# Patient Record
Sex: Female | Born: 1945 | Race: White | Hispanic: No | Smoking: Current every day smoker
Health system: Southern US, Community
[De-identification: ages and names within clinical notes are randomized; demographics above are authoritative.]

## PROBLEM LIST (undated history)

## (undated) ENCOUNTER — Emergency Department (HOSPITAL_COMMUNITY): Payer: Medicaid Other | Source: Home / Self Care

## (undated) DIAGNOSIS — I252 Old myocardial infarction: Secondary | ICD-10-CM

## (undated) DIAGNOSIS — C3490 Malignant neoplasm of unspecified part of unspecified bronchus or lung: Secondary | ICD-10-CM

## (undated) DIAGNOSIS — M1990 Unspecified osteoarthritis, unspecified site: Secondary | ICD-10-CM

## (undated) DIAGNOSIS — E785 Hyperlipidemia, unspecified: Secondary | ICD-10-CM

## (undated) DIAGNOSIS — I10 Essential (primary) hypertension: Secondary | ICD-10-CM

## (undated) DIAGNOSIS — K219 Gastro-esophageal reflux disease without esophagitis: Secondary | ICD-10-CM

## (undated) DIAGNOSIS — I2510 Atherosclerotic heart disease of native coronary artery without angina pectoris: Secondary | ICD-10-CM

## (undated) DIAGNOSIS — E1121 Type 2 diabetes mellitus with diabetic nephropathy: Secondary | ICD-10-CM

## (undated) DIAGNOSIS — I4819 Other persistent atrial fibrillation: Secondary | ICD-10-CM

## (undated) DIAGNOSIS — I639 Cerebral infarction, unspecified: Secondary | ICD-10-CM

## (undated) DIAGNOSIS — E1140 Type 2 diabetes mellitus with diabetic neuropathy, unspecified: Secondary | ICD-10-CM

## (undated) HISTORY — DX: Type 2 diabetes mellitus with diabetic nephropathy: E11.21

## (undated) HISTORY — DX: Hyperlipidemia, unspecified: E78.5

## (undated) HISTORY — DX: Other persistent atrial fibrillation: I48.19

## (undated) HISTORY — DX: Atherosclerotic heart disease of native coronary artery without angina pectoris: I25.10

## (undated) HISTORY — DX: Type 2 diabetes mellitus with diabetic neuropathy, unspecified: E11.40

## (undated) HISTORY — DX: Unspecified osteoarthritis, unspecified site: M19.90

## (undated) HISTORY — DX: Cerebral infarction, unspecified: I63.9

## (undated) HISTORY — DX: Gastro-esophageal reflux disease without esophagitis: K21.9

## (undated) HISTORY — PX: CHOLECYSTECTOMY: SHX55

## (undated) HISTORY — DX: Old myocardial infarction: I25.2

## (undated) HISTORY — DX: Essential (primary) hypertension: I10

## (undated) HISTORY — DX: Malignant neoplasm of unspecified part of unspecified bronchus or lung: C34.90

## (undated) MED FILL — Ferumoxytol Inj 510 MG/17ML (30 MG/ML) (Elemental Fe): INTRAVENOUS | Qty: 17 | Status: AC

## (undated) MED FILL — Pembrolizumab IV Soln 100 MG/4ML (25 MG/ML): INTRAVENOUS | Qty: 8 | Status: AC

---

## 2002-05-26 DIAGNOSIS — I252 Old myocardial infarction: Secondary | ICD-10-CM

## 2002-05-26 HISTORY — DX: Old myocardial infarction: I25.2

## 2005-04-11 ENCOUNTER — Ambulatory Visit: Payer: Self-pay | Admitting: Cardiology

## 2005-04-11 ENCOUNTER — Inpatient Hospital Stay (HOSPITAL_COMMUNITY): Admission: AD | Admit: 2005-04-11 | Discharge: 2005-04-13 | Payer: Self-pay | Admitting: Cardiology

## 2010-08-25 ENCOUNTER — Inpatient Hospital Stay (HOSPITAL_COMMUNITY)
Admission: AD | Admit: 2010-08-25 | Discharge: 2010-08-27 | DRG: 247 | Disposition: A | Discharge status: Home / Self Care | Payer: PRIVATE HEALTH INSURANCE | Source: Other Acute Inpatient Hospital | Attending: Cardiovascular Disease | Admitting: Cardiovascular Disease

## 2010-08-25 DIAGNOSIS — Z7982 Long term (current) use of aspirin: Secondary | ICD-10-CM

## 2010-08-25 DIAGNOSIS — I200 Unstable angina: Secondary | ICD-10-CM | POA: Diagnosis present

## 2010-08-25 DIAGNOSIS — E119 Type 2 diabetes mellitus without complications: Secondary | ICD-10-CM | POA: Diagnosis present

## 2010-08-25 DIAGNOSIS — Z9861 Coronary angioplasty status: Secondary | ICD-10-CM

## 2010-08-25 DIAGNOSIS — I10 Essential (primary) hypertension: Secondary | ICD-10-CM | POA: Diagnosis present

## 2010-08-25 DIAGNOSIS — Z7902 Long term (current) use of antithrombotics/antiplatelets: Secondary | ICD-10-CM

## 2010-08-25 DIAGNOSIS — E785 Hyperlipidemia, unspecified: Secondary | ICD-10-CM | POA: Diagnosis present

## 2010-08-25 DIAGNOSIS — F17200 Nicotine dependence, unspecified, uncomplicated: Secondary | ICD-10-CM | POA: Diagnosis present

## 2010-08-25 DIAGNOSIS — Y92009 Unspecified place in unspecified non-institutional (private) residence as the place of occurrence of the external cause: Secondary | ICD-10-CM

## 2010-08-25 DIAGNOSIS — I2510 Atherosclerotic heart disease of native coronary artery without angina pectoris: Principal | ICD-10-CM | POA: Diagnosis present

## 2010-08-25 DIAGNOSIS — T82897A Other specified complication of cardiac prosthetic devices, implants and grafts, initial encounter: Secondary | ICD-10-CM | POA: Diagnosis present

## 2010-08-25 DIAGNOSIS — Y840 Cardiac catheterization as the cause of abnormal reaction of the patient, or of later complication, without mention of misadventure at the time of the procedure: Secondary | ICD-10-CM | POA: Diagnosis present

## 2010-08-25 DIAGNOSIS — R079 Chest pain, unspecified: Secondary | ICD-10-CM

## 2010-08-25 DIAGNOSIS — Z8249 Family history of ischemic heart disease and other diseases of the circulatory system: Secondary | ICD-10-CM

## 2010-08-26 DIAGNOSIS — I2510 Atherosclerotic heart disease of native coronary artery without angina pectoris: Secondary | ICD-10-CM

## 2010-08-26 LAB — LIPID PANEL
Cholesterol: 174 mg/dL (ref 0–200)
HDL: 33 mg/dL — ABNORMAL LOW (ref >39)
LDL Cholesterol: UNDETERMINED mg/dL (ref 0–99)
Total CHOL/HDL Ratio: 5.3 RATIO
Triglycerides: 899 mg/dL — ABNORMAL HIGH (ref <150)
VLDL: UNDETERMINED mg/dL (ref 0–40)

## 2010-08-26 LAB — GLUCOSE, CAPILLARY
Glucose-Capillary: 141 mg/dL — ABNORMAL HIGH (ref 70–99)
Glucose-Capillary: 130 mg/dL — ABNORMAL HIGH (ref 70–99)
Glucose-Capillary: 123 mg/dL — ABNORMAL HIGH (ref 70–99)
Glucose-Capillary: 196 mg/dL — ABNORMAL HIGH (ref 70–99)
Glucose-Capillary: 119 mg/dL — ABNORMAL HIGH (ref 70–99)

## 2010-08-26 LAB — CARDIAC PANEL(CRET KIN+CKTOT+MB+TROPI)
CK, MB: 1.4 ng/mL (ref 0.3–4.0)
CK, MB: 1.5 ng/mL (ref 0.3–4.0)
CK, MB: 1.4 ng/mL (ref 0.3–4.0)
Relative Index: INVALID (ref 0.0–2.5)
Relative Index: INVALID (ref 0.0–2.5)
Relative Index: INVALID (ref 0.0–2.5)
Total CK: 90 U/L (ref 7–177)
Total CK: 75 U/L (ref 7–177)
Total CK: 80 U/L (ref 7–177)
Troponin I: 0.02 ng/mL (ref 0.00–0.06)
Troponin I: 0.01 ng/mL (ref 0.00–0.06)
Troponin I: 0.01 ng/mL (ref 0.00–0.06)

## 2010-08-26 LAB — COMPREHENSIVE METABOLIC PANEL
ALT: 21 U/L (ref 0–35)
AST: 22 U/L (ref 0–37)
Albumin: 3.9 g/dL (ref 3.5–5.2)
Alkaline Phosphatase: 62 U/L (ref 39–117)
BUN: 22 mg/dL (ref 6–23)
CO2: 24 mEq/L (ref 19–32)
Calcium: 9.3 mg/dL (ref 8.4–10.5)
Chloride: 107 mEq/L (ref 96–112)
Creatinine, Ser: 0.91 mg/dL (ref 0.4–1.2)
GFR calc Af Amer: >60 mL/min (ref >60)
GFR calc non Af Amer: >60 mL/min (ref >60)
Glucose, Bld: 118 mg/dL — ABNORMAL HIGH (ref 70–99)
Potassium: 4 mEq/L (ref 3.5–5.1)
Sodium: 138 mEq/L (ref 135–145)
Total Bilirubin: 0.5 mg/dL (ref 0.3–1.2)
Total Protein: 6.7 g/dL (ref 6.0–8.3)

## 2010-08-26 LAB — CBC
HCT: 37.5 % (ref 36.0–46.0)
Hemoglobin: 12.7 g/dL (ref 12.0–15.0)
MCH: 29.3 pg (ref 26.0–34.0)
MCHC: 33.9 g/dL (ref 30.0–36.0)
MCV: 86.6 fL (ref 78.0–100.0)
Platelets: 200 10*3/uL (ref 150–400)
RBC: 4.33 MIL/uL (ref 3.87–5.11)
RDW: 13 % (ref 11.5–15.5)
WBC: 8.2 10*3/uL (ref 4.0–10.5)

## 2010-08-26 LAB — DIFFERENTIAL
Basophils Absolute: 0 10*3/uL (ref 0.0–0.1)
Basophils Relative: 0 % (ref 0–1)
Eosinophils Absolute: 0.1 10*3/uL (ref 0.0–0.7)
Eosinophils Relative: 2 % (ref 0–5)
Lymphocytes Relative: 54 % — ABNORMAL HIGH (ref 12–46)
Lymphs Abs: 4.4 10*3/uL — ABNORMAL HIGH (ref 0.7–4.0)
Monocytes Absolute: 0.5 10*3/uL (ref 0.1–1.0)
Monocytes Relative: 6 % (ref 3–12)
Neutro Abs: 3.2 10*3/uL (ref 1.7–7.7)
Neutrophils Relative %: 39 % — ABNORMAL LOW (ref 43–77)

## 2010-08-26 LAB — POCT ACTIVATED CLOTTING TIME: Activated Clotting Time: 452 seconds

## 2010-08-26 LAB — MRSA PCR SCREENING: MRSA by PCR: NEGATIVE

## 2010-08-26 LAB — HEMOGLOBIN A1C
Hgb A1c MFr Bld: 7 % — ABNORMAL HIGH (ref <5.7)
Mean Plasma Glucose: 154 mg/dL — ABNORMAL HIGH (ref <117)

## 2010-08-26 LAB — PROTIME-INR
INR: 0.94 (ref 0.00–1.49)
Prothrombin Time: 12.8 seconds (ref 11.6–15.2)

## 2010-08-27 LAB — CBC
HCT: 34.2 % — ABNORMAL LOW (ref 36.0–46.0)
Hemoglobin: 11.6 g/dL — ABNORMAL LOW (ref 12.0–15.0)
MCH: 29.4 pg (ref 26.0–34.0)
MCHC: 33.9 g/dL (ref 30.0–36.0)
MCV: 86.8 fL (ref 78.0–100.0)
Platelets: 169 10*3/uL (ref 150–400)
RBC: 3.94 MIL/uL (ref 3.87–5.11)
RDW: 12.8 % (ref 11.5–15.5)
WBC: 7.3 10*3/uL (ref 4.0–10.5)

## 2010-08-27 LAB — BASIC METABOLIC PANEL
BUN: 20 mg/dL (ref 6–23)
CO2: 24 mEq/L (ref 19–32)
Calcium: 8.9 mg/dL (ref 8.4–10.5)
Chloride: 109 mEq/L (ref 96–112)
Creatinine, Ser: 0.89 mg/dL (ref 0.4–1.2)
GFR calc Af Amer: >60 mL/min (ref >60)
GFR calc non Af Amer: >60 mL/min (ref >60)
Glucose, Bld: 127 mg/dL — ABNORMAL HIGH (ref 70–99)
Potassium: 4 mEq/L (ref 3.5–5.1)
Sodium: 141 mEq/L (ref 135–145)

## 2010-08-27 LAB — GLUCOSE, CAPILLARY: Glucose-Capillary: 133 mg/dL — ABNORMAL HIGH (ref 70–99)

## 2010-09-09 ENCOUNTER — Encounter: Payer: Self-pay | Admitting: Physician Assistant

## 2010-09-09 ENCOUNTER — Encounter (INDEPENDENT_AMBULATORY_CARE_PROVIDER_SITE_OTHER): Payer: PRIVATE HEALTH INSURANCE | Admitting: Physician Assistant

## 2010-09-09 DIAGNOSIS — F17200 Nicotine dependence, unspecified, uncomplicated: Secondary | ICD-10-CM

## 2010-09-09 DIAGNOSIS — I2510 Atherosclerotic heart disease of native coronary artery without angina pectoris: Secondary | ICD-10-CM

## 2010-09-09 DIAGNOSIS — E785 Hyperlipidemia, unspecified: Secondary | ICD-10-CM | POA: Insufficient documentation

## 2010-09-09 DIAGNOSIS — I10 Essential (primary) hypertension: Secondary | ICD-10-CM | POA: Insufficient documentation

## 2010-09-09 DIAGNOSIS — M25529 Pain in unspecified elbow: Secondary | ICD-10-CM

## 2010-09-09 DIAGNOSIS — Z720 Tobacco use: Secondary | ICD-10-CM | POA: Insufficient documentation

## 2010-09-09 DIAGNOSIS — E119 Type 2 diabetes mellitus without complications: Secondary | ICD-10-CM | POA: Insufficient documentation

## 2010-09-09 HISTORY — DX: Atherosclerotic heart disease of native coronary artery without angina pectoris: I25.10

## 2010-09-09 HISTORY — DX: Hyperlipidemia, unspecified: E78.5

## 2010-09-13 NOTE — Assessment & Plan Note (Addendum)
Summary: eph per night message/lg   CC:  pt complians of sob.  History of Present Illness: Primary Cardiologist:  Dr. Shawnie Pons  [name] is a 65 yo female with a h/o CAD, status post multiple percutaneous coronary interventions in the past.  She underwent PCI to the RCA in 2003, drug-eluting stent placement to the LAD and circumflex in March of 2006 and repeat drug eluting stent placement to the LAD and circumflex in October 2006.  She presented to [facility] on 08/25/10 with complaints of chest discomfort consistent with unstable angina.  Cardiac catheterization demonstrated a totally occluded mid circumflex stent leading into the third obtuse marginal which was felt to be chronic and this was treated medically.  Her mid RCA demonstrated a 90% in-stent restenosis and this was treated it with a Promus drug-eluting stent.  Her EF was 60%.  She presents for followup.  Since discharge from the hospital, she denies any further chest pain.  However, she continues to have episodic left elbow pain.  She's had this in the past with her coronary disease.  She began to have it about 3 weeks prior to her recent trip to the hospital.  It is unchanged.  It is nonexertional.  It only lasts about one to 2 minutes.  She's never tried nitroglycerin for it.  She denies orthopnea, PND or pedal edema.  She denies syncope.  She denies exertional chest pain.  She does have exertional shortness of breath.  This is chronic without change.  She describes an NYHA class II to IIb symptoms.  She continues to smoke cigarettes.  Current Medications (verified): 1)  Aspirin 81 Mg Tbec (Aspirin) .... Take One Tablet By Mouth Daily 2)  Nitrostat 0.4 Mg Subl (Nitroglycerin) .Marland Kitchen.. 1 Tablet Under Tongue At Onset of Chest Pain; You May Repeat Every 5 Minutes For Up To 3 Doses. 3)  Metoprolol Tartrate 50 Mg Tabs (Metoprolol Tartrate) .... 1/2 Tab By Mouth Two Times A Day 4)  Crestor 20 Mg Tabs (Rosuvastatin Calcium) ....  Take One Tablet By Mouth Daily. 5)  Janumet 50-500 Mg Tabs (Sitagliptin-Metformin Hcl) .Marland Kitchen.. 1 Tab By Mouth Two Times A Day 6)  Lisinopril 10 Mg Tabs (Lisinopril) .... Take One Tablet By Mouth Daily 7)  Mobic 7.5 Mg Tabs (Meloxicam) .... As Needed 8)  Nexium 40 Mg Cpdr (Esomeprazole Magnesium) .Marland Kitchen.. 1 Tab By Mouth Once Daily 9)  Plavix 75 Mg Tabs (Clopidogrel Bisulfate) .... Take One Tablet By Mouth Daily 10)  Symbicort 80-4.5 Mcg/act Aero (Budesonide-Formoterol Fumarate) .... As Needed 11)  Vitamin D (Ergocalciferol) 50000 Unit Caps (Ergocalciferol) .... Weekly  Past History:  Past Medical History: 1. Coronary artery disease       a.     Status post angioplasty and drug-eluting stent placement in                the mid right coronary artery on August 26, 2010.  (mid circumflex stent occluded leading into               the third obtuse marginal      b.     Status post percutaneous transluminal coronary angioplasty        of the right coronary artery in 2003.       c.     Status post percutaneous coronary intervention to the left        anterior descending and circumflex in 2006, at an outside  hospital.       d.     Status post percutaneous coronary intervention of the distal        left anterior descending in 2006 as well as status post distal        left circumflex stenting.   2. Hypertension.   3. Hyperlipidemia.   4. Non-insulin-dependent diabetes mellitus.   5. Ongoing tobacco abuse.      Review of Systems       As per  the HPI.  All other systems reviewed and negative.   Vital Signs:  Patient profile:   65 year old female Height:      66 inches Weight:      137 pounds BMI:     22.19 Pulse rate:   76 / minute Resp:     14 per minute BP sitting:   116 / 74  (left arm)  Vitals Entered By: Kem Parkinson (September 09, 2010 10:01 AM)  Physical Exam  General:  Well developed, well nourished, in no acute distress. Head:  normocephalic and atraumatic Neck:  No  JVD Lungs:  Decreased breath sounds bilaterally, no wheezing, no rales Heart:  Normal S1-S2, regular rate and rhythm, no murmur Abdomen:  soft, nontender, no organomegaly Extremities:  no edema; right radial artery site without hematoma or bruit Neurologic:  Alert and oriented x 3. Skin:  warm and dry Psych:  Normal affect.   EKG  Procedure date:  09/09/2010  Findings:      Normal sinus rhythm, heart rate 73, normal axis, no ischemic changes  Impression & Recommendations:  Problem # 1:  CORONARY ATHEROSCLEROSIS NATIVE CORONARY ARTERY (ICD-414.01) She is stable post PCI of her RCA.  She does have a chronically occluded mid circumflex leading into the third obtuse marginal.  She continues to have some left elbow pain.  I question whether or not this is a stable anginal symptom.  On exam of her elbow, she has no deformities or pain with palpation.  Her symptoms are very atypical.  However, she has had this in the past.   I had a long discussion with her about adjusting her antianginals.  We decided to change her metoprolol to 50 mg twice a day.  She does live in [place].  She would like to see Dr.  Kirke Corin in followup.  Therefore, I will have her followup with him in the next 2-3 weeks to reassess her symptoms and response to the adjustment in her medications.  Problem # 2:  HYPERTENSION, ESSENTIAL (ICD-401.9) Controlled  Problem # 3:  HYPERLIPIDEMIA (ICD-272.4) Her triglycerides were 899 in the hospital.  She states that this is an improvement compared to what she's had in the past.  This is followed closely by her PCP.  She should followup with her PCP.  Problem # 4:  TOBACCO ABUSE (ICD-305.1) I advised her to quit smoking.  I explained to her that  the dangers of her developing recurrent stenoses are great while she continues to smoke.  She understands and accepts this risk  Other Orders: EKG w/ Interpretation (93000)  Patient Instructions: 1)  Your physician recommends that you  schedule a follow-up appointment in: 2-3 weeks with Dr. Kirke Corin in [place] office.Marland KitchenMarland Kitchen 2)  You have been referred to follow up with your Primary Care Physcian for your Cholesterol to be checked as per Tereso Newcomer, PA-C. 3)  Your physician has recommended you make the following change in your medication: WE HAVE INCREASED YOUR METOPROLOL TARTRATE TO 50  MG 1 TABLET two times a day Prescriptions: METOPROLOL TARTRATE 50 MG TABS (METOPROLOL TARTRATE) take 1 tablet two times a day  #60 x 6   Entered by:   Danielle Rankin, CMA   Authorized by:   Tereso Newcomer PA-C   Signed by:   Danielle Rankin, CMA on 09/09/2010   Method used:   Electronically to        [facility]  [address] [id number] (retail)       [address]       [place], [place]  [address]       Ph: [phone] or [phone]       Fax: [phone]   RxID:   [id number]   I have personally reviewed the prescriptions today for accuracy.Tereso Newcomer PA-C  September 09, 2010 2:54 PM

## 2010-09-14 NOTE — Discharge Summary (Signed)
[name], [name]                ACCOUNT NO.:  [id number]  MEDICAL RECORD NO.:  [id number]           PATIENT TYPE:  I  LOCATION:  6531                         FACILITY:  [facility]  PHYSICIAN:  Gerrit Friends. Dietrich Pates, MD, FACCDATE OF BIRTH:  02-28-1946  DATE OF ADMISSION:  08/25/2010 DATE OF DISCHARGE:  08/27/2010                              DISCHARGE SUMMARY   PRIMARY CARDIOLOGIST:  Maisie Fus D. Riley Kill, MD, [facility]  DISCHARGE DIAGNOSES: 1. Coronary artery disease     a.     Status post angioplasty and drug-eluting stent placement in      the mid right coronary artery on August 26, 2010.     b.     Status post percutaneous transluminal coronary angioplasty      of the right coronary artery in 2003.     c.     Status post percutaneous coronary intervention to the left      anterior descending and circumflex in 2006, at an outside      hospital.     d.     Status post percutaneous coronary intervention of the distal     left anterior descending in 2006 as well as status post distal      left circumflex stenting. 2. Hypertension. 3. Hyperlipidemia. 4. Non-insulin-dependent diabetes mellitus. 5. Ongoing tobacco abuse.  PROCEDURE/DIAGNOSTICS PERFORMED DURING HOSPITALIZATION:  Left heart catheterization with coronary angiography and left ventricular angiography. a.  Status post angioplasty and drug-eluting stent placement in the mid RCA with a Promus Element stent, 3.5 x 24 mm.  REASON FOR HOSPITALIZATION:  This is a 65 year old female with multiple percutaneous coronary interventions, as above as well as the above- stated past medical history who presented to the [facility] Emergency Department for evaluation of chest pain.  Of note, the patient has been under increased stress with her sister passing away recently.  She began to develop substernal chest pressure that radiated to her left arm and elbow.  This occurred at rest and intermittently for approximately 6 hours.  Her initial EKG showed  normal sinus rhythm at 76 beats per minute with no ischemic changes.  Her initial point-of-care markers were negative.  The patient was admitted for cardiac catheterization and possible coronary artery intervention.  Risks, benefits, and alternatives were discussed with the patient and she agreed to proceed.  HOSPITAL COURSE:  The patient was observed overnight and she had no further complaints of chest pain.  The patient was started on heparin, continued on aspirin and Plavix as well as beta-blocker until cardiac catheterization.  The patient did rule out for myocardial infarction, but with the patient's previous cardiac history and symptoms suggestive unstable angina the patient was taken for cardiac catheterization.  On August 26, 2010, Dr. Kirke Corin brought the patient to the Cath Lab, informed consent was obtained.  As above, there was significant three-vessel coronary artery disease with patent stents and left anterior descending with only mild restenosis.  There was occlusion of the mid left circumflex stent supplying the OM-3 distribution.  There was also severe in-stent restenosis in the mid right coronary artery.  Dr. Kirke Corin performed successful  angioplasty and drug-eluting stent placement in the mid RCA, initial stenosis was 90%.  Postprocedure, there was a 0% residual stenosis.  The mid circumflex did appear to be chronically occluded and therefore revascularization was not possible and not recommended.  The territory that this supplies was overall small.  The patient tolerated the procedure well and was taken for overnight observation.  Recommendation of aspirin and Plavix indefinitely per Dr. Kirke Corin.  Smoking cessation was also strongly advised with aggressive treatment of risk factors.  During the event, the patient did complain of intermittent shortness of breath, although the patient's sats were 99- 100% on room air.  Her lungs were clear.  The patient did improve with 2 liters  of O2.  She had no further complaints after this episode.  The patient on the day of discharge was evaluated by Dr. Dietrich Pates and noted stable for home.  It was noted that the patient's right hand was mildly edematous, although normal right pulses.  She had adequate strength and sensation.  The patient was without pain.  The patient's cath site was without hematoma.  Again, smoking cessation was encouraged.  The patient had quit smoking for 5 years and agrees to quit again.  Further attention to triglycerides will be discussed with Dr. Riley Kill as an outpatient.  Discharge plan and instructions were discussed with the patient and she voiced understanding.  DISCHARGE LABORATORY DATA:  WBC 7.3, hemoglobin 11.6, hematocrit 34.2, and platelets 169.  Sodium 141, potassium 4, chloride 109, bicarb 24, BUN 20, and creatinine 0.89.  DISCHARGE MEDICATIONS: 1. Aspirin 81 mg daily. 2. Nitrostat 0.4 mg sublingual 1 tablet under tongue every 5 minutes     up to 3 doses as needed for chest pain. 3. Metoprolol tartrate 50 mg 1/2 tablet twice daily. 4. Crestor 20 mg daily. 5. Gabapentin 600 mg 1 capsule daily. 6. Janumet 1 tablet twice daily, the patient will hold this medication     that includes metformin until Monday, August 29, 2010. 7. Lisinopril 10 mg daily. 8. Mobic 7.5 mg 1-2 tablets daily as needed for pain. 9. Nexium 40 mg daily. 10.Plavix 75 mg daily. 11.Symbicort 1 puff inhaled twice daily. 12.Vitamin D2 50,000 units 1 tablet weekly.  DISCHARGE PLAN AND INSTRUCTIONS: 1. The patient will follow up with Dr. Riley Kill in 2 weeks, the office     will call and schedule this appointment. 2. The patient is to increase activity slowly.  She may shower, but no     bathing.  No lifting for 1 week greater than or equal to 5 pounds.     No sexual activity for 1 week. 3. She is to keep her cath site clean and dry and call the office for     any problems. 4. She is continue a low-sodium,  heart-healthy, and diabetic diet. 5. She is to avoid any straining and stop any activity that causes     chest pain or shortness of breath.  DURATION OF DISCHARGE:  Greater than 30 minutes with physician and physician extender time.     Leonette Monarch, PA-C   ______________________________ Gerrit Friends. Dietrich Pates, MD, [facility]    NB/MEDQ  D:  08/27/2010  T:  08/27/2010  Job:  [id number]  cc:   Arturo Morton. Riley Kill, MD, [facility]  Electronically Signed by Alen Blew P.A. on 09/01/2010 01:34:10 PM Electronically Signed by Edmond Bing MD [facility] on 09/14/2010 06:42:14 PM

## 2010-09-22 NOTE — Procedures (Signed)
NAMECLEOPHA, INDELICATO                ACCOUNT NO.:  [id number]  MEDICAL RECORD NO.:  [id number]           PATIENT TYPE:  I  LOCATION:  6531                         FACILITY:  [facility]  PHYSICIAN:  Lorine Bears, MD     DATE OF BIRTH:  1945/07/23  DATE OF PROCEDURE: DATE OF DISCHARGE:                           CARDIAC CATHETERIZATION   PRIMARY CARDIOLOGIST:  Arturo Morton. Riley Kill, MD, [facility]  PROCEDURES PERFORMED: 1. Left heart catheterization. 2. Coronary angiography. 3. Left ventricular angiography. 4. Right coronary artery angioplasty and a drug-eluting stent     placement for in-stent restenosis.  INDICATIONS AND CLINICAL HISTORY:  This is a 65 year old female with known history of coronary artery disease status post multiple angioplasties and stent placement in the past.  She also has history of hyperlipidemia, hypertension and ongoing tobacco use.  She presented with symptoms suggestive of unstable angina with minimally elevated cardiac enzymes.  Due to her symptoms and previous cardiac history, cardiac catheterization and possible coronary artery intervention were recommended.  Risks, benefits and alternatives were discussed with the patient.  Access is right radial artery.  STUDY DETAILS:  A standard informed consent was obtained.  She was given fentanyl and Versed for sedation.  The right radial area was prepped in a sterile fashion.  It was anesthetized with 1% lidocaine.  A 5-French sheath was placed in the right radial artery after anterior puncture. Verapamil 3 mg was given through the sheath.  A 3000 units of unfractioned heparin was given intravenously.  Coronary angiography was performed with a TIG catheter for the left coronary artery, JR-4 as well as an angled pigtail catheter.  All catheter exchanges were done over the wire.  INTERVENTIONAL PROCEDURE DETAILS:  The sheath was up sized to a 6-French sheath.  Bivalirudin was initiated with a therapeutic ACT.  A  JR-4 guiding catheter was used to engage the right coronary artery.  The lesion was crossed with a Prowater wire.  It was predilated with 2.5 x 15-mm Trek balloon to 14 atmospheres.  I then placed a 3.5 x 24-mm Promus Element stent to 11 atmospheres.  This was postdilated in the mid segment with 3.75 x 20 mm Weston Trek balloon to 6 atmospheres and to 14 atmospheres proximally.  Angiography showed excellent results.  There was TIMI 3 flow with a 0% residual stenosis.  The guiding catheter was removed over a wire.  The sheath was removed and a TR band was applied. There were no immediate complications.  STUDY FINDINGS:  Hemodynamic findings:  Left ventricular pressure was 119/12 with a left ventricular end-diastolic pressure of 17 mmHg. Aortic pressure was 111/62 with a mean of 83 mmHg.  Left ventricular angiography:  This showed an ejection fraction of 60% with normal wall motion.  CORONARY ANGIOGRAPHY:    Left main coronary artery:  The vessel was normal in size and free of significant disease.  Left anterior descending artery:  The vessel was normal in size. Proximally there is a 30% tubular stenosis before placed stent.  There is 2 overlapped stents noted in the mid LAD jailing large diagonal branch. Overall the 2 stents  are patent with 30-40% stenosis in the mid segment. The distal portion of the LAD has a 30% tubular stenosis.  There is one large diagonal which supplies the second and third diagonal distribution area.  The vessel is normal in size with 20% ostial stenosis.  Left circumflex artery:  The vessel was normal in size.  There is a stent noted in the mid segment which is patent with 20% in-stent restenosis. The continuation of the circumflex into OM-3 is occluded after giving the second OM branch.  First OM is small size vessel.  Second obtuse marginal is a normal-sized vessel with 20% ostial stenosis.  Right coronary artery:  The vessel was large size and dominant.  A  stent is noted in the mid segment with a severe 90% in-stent restenosis which is diffuse.  Distal to the stent there is a 20% stenosis.  The rest of the RCA has mild atherosclerosis without significant disease.  The right PDA has a diffuse 20% proximal stenosis.  There are 3 posterolateral branches which are free of significant disease.  CONCLUSIONS: 1. Significant three-vessel coronary artery disease with patent stents     in left anterior descending artery with only mild restenosis,     occluded mid left circumflex stent supplying OM-3 distribution.     Severe in-stent restenosis in the mid right coronary artery. 2. Normal LV systolic function. 3. Successful angioplasty and drug-eluting stent placement in the mid     RCA for initial stenosis of 90%.  Postprocedure, there was 0%     residual stenosis with TIMI 3 flow.  RECOMMENDATIONS: 1. Recommend aspirin daily indefinitely as well as Plavix daily     indefinitely if possible. 2. The midcircumflex stent appears to be chronically occluded.  Percutaneous     revascularization is not possible and not recommended.  The     territory appears to be overall small. 3. Smoking cessation is strongly advised as well as aggressive     treatment of risk factors.     Lorine Bears, MD     MA/MEDQ  D:  08/26/2010  T:  08/27/2010  Job:  [id number]  Electronically Signed by Lorine Bears MD on 09/22/2010 11:22:36 PM

## 2010-09-26 ENCOUNTER — Encounter: Payer: Self-pay | Admitting: Cardiovascular Disease

## 2010-09-26 ENCOUNTER — Ambulatory Visit (INDEPENDENT_AMBULATORY_CARE_PROVIDER_SITE_OTHER): Payer: PRIVATE HEALTH INSURANCE | Admitting: Cardiovascular Disease

## 2010-09-26 DIAGNOSIS — F17200 Nicotine dependence, unspecified, uncomplicated: Secondary | ICD-10-CM

## 2010-09-26 DIAGNOSIS — E785 Hyperlipidemia, unspecified: Secondary | ICD-10-CM

## 2010-09-26 DIAGNOSIS — E119 Type 2 diabetes mellitus without complications: Secondary | ICD-10-CM

## 2010-09-26 DIAGNOSIS — I2510 Atherosclerotic heart disease of native coronary artery without angina pectoris: Secondary | ICD-10-CM

## 2010-09-26 NOTE — Assessment & Plan Note (Signed)
Discussed importance of diet and exercise. Continue Crestor. Will need a follow up lipid profile in few months. She gets it done with her PCI. I asked her to bring a copy.

## 2010-09-26 NOTE — Assessment & Plan Note (Signed)
Advised her to quit smoking 

## 2010-09-26 NOTE — Assessment & Plan Note (Signed)
This is managed by her PCP. Goal Hg A1C <7.

## 2010-09-26 NOTE — Progress Notes (Signed)
[name] is a 65 yo female with a h/o CAD, status post multiple percutaneous coronary interventions in the past. She underwent PCI to the RCA in 2003, drug-eluting stent placement to the LAD and circumflex in March of 2006 and repeat drug eluting stent placement to the LAD and circumflex in October 2006. She presented to [facility] on 08/25/10 with complaints of chest discomfort consistent with unstable angina. Cardiac catheterization demonstrated a totally occluded mid circumflex stent leading into the third obtuse marginal which was felt to be chronic and this was treated medically. Her mid RCA demonstrated a 90% in-stent restenosis and this was treated it with a Promus drug-eluting stent. Her EF was 60%. She is transferring care from [place] as she lives closer to here. I also performed her most recent procedure.  Since discharge from the hospital, she denies any further chest pain. However, she continues to have episodic left elbow pain. She's had this in the past with her coronary disease. She began to have it about 3 weeks prior to her recent trip to the hospital. It is unchanged. It is nonexertional. It only lasts about one to 2 minutes. She's never tried nitroglycerin for it. She denies orthopnea, PND or pedal edema. She denies syncope. She denies exertional chest pain. She does have exertional shortness of breath. This is chronic without change. She describes an NYHA class II to IIb symptoms. She continues to smoke cigarettes.  Current Medications (verified):  1) Aspirin 81 Mg Tbec (Aspirin) .... Take One Tablet By Mouth Daily  2) Nitrostat 0.4 Mg Subl (Nitroglycerin) .Marland Kitchen.. 1 Tablet Under Tongue At Onset of Chest Pain; You May Repeat Every 5 Minutes For Up To 3 Doses.  3) Metoprolol Tartrate 50 Mg Tabs (Metoprolol Tartrate) .... 1/2 Tab By Mouth Two Times A Day  4) Crestor 20 Mg Tabs (Rosuvastatin Calcium) .... Take One Tablet By Mouth Daily.  5) Janumet 50-500 Mg Tabs  (Sitagliptin-Metformin Hcl) .Marland Kitchen.. 1 Tab By Mouth Two Times A Day  6) Lisinopril 10 Mg Tabs (Lisinopril) .... Take One Tablet By Mouth Daily  7) Mobic 7.5 Mg Tabs (Meloxicam) .... As Needed  8) Nexium 40 Mg Cpdr (Esomeprazole Magnesium) .Marland Kitchen.. 1 Tab By Mouth Once Daily  9) Plavix 75 Mg Tabs (Clopidogrel Bisulfate) .... Take One Tablet By Mouth Daily  10) Symbicort 80-4.5 Mcg/act Aero (Budesonide-Formoterol Fumarate) .... As Needed  11) Vitamin D (Ergocalciferol) 50000 Unit Caps (Ergocalciferol) .... Weekly   Past History:  Past Medical History:  1. Coronary artery disease  a. Status post angioplasty and drug-eluting stent placement in  the mid right coronary artery on August 26, 2010. (mid circumflex stent occluded leading into  the third obtuse marginal  b. Status post percutaneous transluminal coronary angioplasty  of the right coronary artery in 2003.  c. Status post percutaneous coronary intervention to the left  anterior descending and circumflex in 2006, at an outside  hospital.  d. Status post percutaneous coronary intervention of the distal  left anterior descending in 2006 as well as status post distal  left circumflex stenting.  2. Hypertension.  3. Hyperlipidemia.  4. Non-insulin-dependent diabetes mellitus.  5. Ongoing tobacco abuse.  Review of Systems  As per the HPI. All other systems reviewed and negative.    Physical Exam  Filed Vitals:   09/26/10 1207  BP: 136/84  Pulse: 69  Weight: 138 lb 3.2 oz (62.687 kg)  SpO2: 97%   General: Well developed, well nourished, in no acute distress.  Head:  normocephalic and atraumatic  Neck: No JVD  Lungs: Decreased breath sounds bilaterally, no wheezing, no rales  Heart: Normal S1-S2, regular rate and rhythm, no murmur  Abdomen: soft, nontender, no organomegaly  Extremities: no edema; right radial artery site without hematoma or bruit  Neurologic: Alert and oriented x 3.  Skin: warm and dry  Psych: Normal affect.

## 2010-09-26 NOTE — Assessment & Plan Note (Signed)
She is stable post PCI of her RCA. She does have a chronically occluded mid circumflex leading into the third obtuse marginal. She continues to have some left elbow pain. I question whether or not this is a stable anginal symptom. On exam of her elbow, she has no deformities or pain with palpation. Her symptoms are very atypical. During her recent admission for unstable angina, her symptoms were chest tightness. This has resolved completely. Her left elbow discomfort seems to be muscloskeletal. The patient will be referred to cardiac rehab at [facility].

## 2010-10-24 NOTE — H&P (Signed)
[name], ASHER NO.:  [id number]  MEDICAL RECORD NO.:  [id number]           PATIENT TYPE:  I  LOCATION:  2502                         FACILITY:  [facility]  PHYSICIAN:  Therisa Doyne, MD    DATE OF BIRTH:  Sep 17, 1945  DATE OF ADMISSION:  08/25/2010 DATE OF DISCHARGE:                             HISTORY & PHYSICAL   PRIMARY CARDIOLOGIST:  Arturo Morton. Riley Kill, MD, [facility]  CHIEF COMPLAINT:  Chest pain.  HISTORY OF PRESENT ILLNESS:  A 65 year old white female with past medical history significant for coronary artery disease status post multiple percutaneous coronary interventions, diabetes, hypertension, hyperlipidemia, tobacco abuse who presents for evaluation of chest pain. Historically, the patient has a long-standing history of coronary artery disease.  She had a PCI of right coronary artery in 2003.  On September 15, 2004, she had 2 Taxus stents placed to her LAD and circumflex artery in an outside hospital.  On April 11, 2005, at our institution she underwent stenting of the distal LAD with a Taxus stent that was 2.75 x 20 mm.  She also underwent stenting of the distal left circumflex artery with a Taxus Express that was 2.5 x 20 mm.  At that time, she had normal left ventricular function.  Since that time, the patient has not had any problems with chest discomfort until this week.  The patient reports that her sister passed away this week and the patient went to her funeral today.  She reports that yesterday she began developing left-sided arm and elbow pain as well as substernal chest pressure; this was occurring at rest and it was occurring intermittently and lasted for approximately 6 hours.  Today, she had continued symptoms intermittently.  They did not occur with exertion.  Because of these symptoms, she came to the emergency room.  She went to an outside hospital for further evaluation.  At the outside hospital, she was noted to have a normal EKG and  negative cardiac biomarkers and was transferred here for further evaluation.  At baseline, the patient denies any exertional angina.  She is very active stating that she can clean her entire house as well as wash the walls without any significant chest pain, shortness of breath, PND, orthopnea, lower extremity edema.  She denies any tachy palpitations or syncope.  PAST MEDICAL HISTORY: 1. Coronary artery disease (see history of presenting illness for     details). 2. Hypertension. 3. Hyperlipidemia. 4. Diabetes mellitus. 5. Ongoing tobacco abuse.  FAMILY HISTORY:  Notable for multiple family members with coronary artery disease.  She says that 12 of her family members have died, all from coronary artery disease.  SOCIAL HISTORY:  The patient smokes a half pack per day and continues to do this.  She denies any alcohol use.  MEDICATIONS:  The patient is unclear of the exact medications that she is taking, but she can tell me that she is on the following medicines: 1. Plavix 75 mg daily. 2. Crestor 40 mg daily.3. Januvia.  REVIEW OF SYSTEMS:  All systems are reviewed and negative except as mentioned above in the history of  present illness.  ALLERGIES:  No known drug allergies.  PHYSICAL EXAMINATION:  VITAL SIGNS:  Temperature afebrile, blood pressure is 97/51, pulse is 79, respirations 16, oxygen saturation 100% on room air.  GENERAL:  No acute distress. HEENT:  Normocephalic, atraumatic.  Pupils are equal, round, reactive to light and accommodation.  Oropharynx is pink and moist without any lesions. NECK:  Supple.  No lymphadenopathy, no jugular venous distention.  No masses. CARDIOVASCULAR:  Regular rate and rhythm with no murmurs, rubs, or gallops. CHEST:  Clear to auscultation bilaterally. ABDOMEN:  Positive bowel sounds, soft, nontender, and nondistended. EXTREMITIES:  No clubbing, cyanosis, or edema.  Dorsalis pedis pulse 2+ bilaterally. SKIN:  No rashes. BACK:  No  CVA tenderness. PYSCH: Normal affect.  Pertinent data from the outside hospital demonstrates a BMP notable for a BUN of 22, creatinine of 0.9, troponin less than 0.01,  BNP of 27, INR of 1, hemoglobin 14.1, platelets 254.  EKG from [facility] at 23:16 demonstrates sinus rhythm at 76 beats per minute and no ischemic changes.  Chest x-ray from the outside hospital shows no acute cardiopulmonary disease.  ASSESSMENT AND PLAN:  A 65 year old white female with a history of coronary artery disease status post multiple coronary interventions as documented above, hypertension, hyperlipidemia, and diabetes who presents for an episode of chest pain that is occurring in the setting of the recent death of her sister.  1. We will admit the patient to [facility] under Dr. Rosalyn Charters     care.  2. Chest pain.  While she does have risk factors for coronary artery     disease, it is reassuring that her EKG does not show any evidence     of ischemia and her cardiac enzymes are negative.  Certainly, this     could be consistent with unstable angina.  However, other     etiologies include stress-induced cardiomyopathy such as Takotsubo     versus noncardiac chest pain.  We will initiate the patient on     aspirin 325 mg daily.  We will continue her on a home dose of     Plavix and start her on a low-dose beta-blocker.  We will check an     echocardiogram to see what her left ventricular function is and see     if there are any regional wall motion abnormalities that could     account for a stress-induced cardiomyopathy.  We will cycle cardiac     enzymes.  Should the cardiac enzymes be elevated, then I will start     a heparin and recommend that she undergo a cardiac catheterization.     However, if her cardiac enzymes are normal, then I think that a     stress test would be a reasonable approach.  We asked her to get an     accurate home medication list from the patient as she could  tell me     all the meds that she is on.  I have counseled her on the need for     tobacco cessation.  We are going to check a fasting lipid profile.  3. Diabetes mellitus type 2.  Check hemoglobin A1c.  Sliding scale     insulin.  Hold oral medications.  4. Hypertension appears to be stable.  5. Hyperlipidemia.  We will check fasting profile and place the     patient on Crestor.  6. Fluids, electrolytes, and nutrition.  Saline lock IV fluids.  Electrolytes are stable.  N.p.o.  7. DVT prophylaxis with subcutaneous heparin.     Therisa Doyne, MD     SJT/MEDQ  D:  08/26/2010  T:  08/26/2010  Job:  [id number]  Electronically Signed by Aldona Bar MD on 10/24/2010 09:48:48 PM

## 2010-12-22 ENCOUNTER — Ambulatory Visit: Payer: PRIVATE HEALTH INSURANCE | Admitting: Cardiovascular Disease

## 2015-07-14 DIAGNOSIS — E1122 Type 2 diabetes mellitus with diabetic chronic kidney disease: Secondary | ICD-10-CM | POA: Diagnosis not present

## 2015-07-14 DIAGNOSIS — E1143 Type 2 diabetes mellitus with diabetic autonomic (poly)neuropathy: Secondary | ICD-10-CM | POA: Diagnosis not present

## 2015-08-02 DIAGNOSIS — M25552 Pain in left hip: Secondary | ICD-10-CM | POA: Diagnosis not present

## 2015-09-06 DIAGNOSIS — R768 Other specified abnormal immunological findings in serum: Secondary | ICD-10-CM | POA: Diagnosis not present

## 2015-09-06 DIAGNOSIS — M79643 Pain in unspecified hand: Secondary | ICD-10-CM | POA: Diagnosis not present

## 2015-09-06 DIAGNOSIS — M1990 Unspecified osteoarthritis, unspecified site: Secondary | ICD-10-CM | POA: Diagnosis not present

## 2015-09-06 DIAGNOSIS — M2550 Pain in unspecified joint: Secondary | ICD-10-CM | POA: Diagnosis not present

## 2016-06-23 DIAGNOSIS — Z1389 Encounter for screening for other disorder: Secondary | ICD-10-CM | POA: Diagnosis not present

## 2016-06-23 DIAGNOSIS — E7800 Pure hypercholesterolemia, unspecified: Secondary | ICD-10-CM | POA: Diagnosis not present

## 2016-06-23 DIAGNOSIS — I2510 Atherosclerotic heart disease of native coronary artery without angina pectoris: Secondary | ICD-10-CM | POA: Diagnosis not present

## 2016-06-23 DIAGNOSIS — E119 Type 2 diabetes mellitus without complications: Secondary | ICD-10-CM | POA: Diagnosis not present

## 2016-06-23 DIAGNOSIS — Z0000 Encounter for general adult medical examination without abnormal findings: Secondary | ICD-10-CM | POA: Diagnosis not present

## 2016-06-23 DIAGNOSIS — Z87891 Personal history of nicotine dependence: Secondary | ICD-10-CM | POA: Diagnosis not present

## 2016-06-23 DIAGNOSIS — Z23 Encounter for immunization: Secondary | ICD-10-CM | POA: Diagnosis not present

## 2016-06-27 DIAGNOSIS — E1121 Type 2 diabetes mellitus with diabetic nephropathy: Secondary | ICD-10-CM | POA: Diagnosis not present

## 2016-06-27 DIAGNOSIS — E7800 Pure hypercholesterolemia, unspecified: Secondary | ICD-10-CM | POA: Diagnosis not present

## 2016-06-27 DIAGNOSIS — N182 Chronic kidney disease, stage 2 (mild): Secondary | ICD-10-CM | POA: Diagnosis not present

## 2016-06-27 DIAGNOSIS — E1129 Type 2 diabetes mellitus with other diabetic kidney complication: Secondary | ICD-10-CM | POA: Diagnosis not present

## 2016-06-27 DIAGNOSIS — E119 Type 2 diabetes mellitus without complications: Secondary | ICD-10-CM | POA: Diagnosis not present

## 2016-07-13 DIAGNOSIS — R1013 Epigastric pain: Secondary | ICD-10-CM | POA: Diagnosis not present

## 2016-07-13 DIAGNOSIS — I130 Hypertensive heart and chronic kidney disease with heart failure and stage 1 through stage 4 chronic kidney disease, or unspecified chronic kidney disease: Secondary | ICD-10-CM | POA: Diagnosis not present

## 2016-07-13 DIAGNOSIS — J449 Chronic obstructive pulmonary disease, unspecified: Secondary | ICD-10-CM | POA: Diagnosis not present

## 2016-07-13 DIAGNOSIS — R1110 Vomiting, unspecified: Secondary | ICD-10-CM | POA: Diagnosis not present

## 2016-07-13 DIAGNOSIS — N390 Urinary tract infection, site not specified: Secondary | ICD-10-CM | POA: Diagnosis not present

## 2016-07-13 DIAGNOSIS — R109 Unspecified abdominal pain: Secondary | ICD-10-CM | POA: Diagnosis not present

## 2016-07-13 DIAGNOSIS — I509 Heart failure, unspecified: Secondary | ICD-10-CM | POA: Diagnosis not present

## 2016-07-13 DIAGNOSIS — E1122 Type 2 diabetes mellitus with diabetic chronic kidney disease: Secondary | ICD-10-CM | POA: Diagnosis not present

## 2016-07-13 DIAGNOSIS — R112 Nausea with vomiting, unspecified: Secondary | ICD-10-CM | POA: Diagnosis not present

## 2016-07-13 DIAGNOSIS — N182 Chronic kidney disease, stage 2 (mild): Secondary | ICD-10-CM | POA: Diagnosis not present

## 2016-07-13 DIAGNOSIS — R1084 Generalized abdominal pain: Secondary | ICD-10-CM | POA: Diagnosis not present

## 2016-07-26 ENCOUNTER — Ambulatory Visit: Payer: Medicaid Other | Admitting: Cardiology

## 2016-09-21 DIAGNOSIS — E1140 Type 2 diabetes mellitus with diabetic neuropathy, unspecified: Secondary | ICD-10-CM | POA: Diagnosis not present

## 2016-09-21 DIAGNOSIS — E7800 Pure hypercholesterolemia, unspecified: Secondary | ICD-10-CM | POA: Diagnosis not present

## 2016-09-21 DIAGNOSIS — I10 Essential (primary) hypertension: Secondary | ICD-10-CM | POA: Diagnosis not present

## 2016-09-21 DIAGNOSIS — M810 Age-related osteoporosis without current pathological fracture: Secondary | ICD-10-CM | POA: Diagnosis not present

## 2016-09-21 DIAGNOSIS — E559 Vitamin D deficiency, unspecified: Secondary | ICD-10-CM | POA: Diagnosis not present

## 2016-10-01 ENCOUNTER — Inpatient Hospital Stay (HOSPITAL_COMMUNITY)
Admission: EM | Admit: 2016-10-01 | Discharge: 2016-10-12 | DRG: 231 | Disposition: A | Discharge status: Home Health | Payer: Medicare Other | Attending: Thoracic Surgery (Cardiothoracic Vascular Surgery) | Admitting: Thoracic Surgery (Cardiothoracic Vascular Surgery)

## 2016-10-01 ENCOUNTER — Inpatient Hospital Stay (HOSPITAL_COMMUNITY)
Admission: EM | Disposition: A | Discharge status: Home Health | Payer: Self-pay | Source: Home / Self Care | Attending: Thoracic Surgery (Cardiothoracic Vascular Surgery)

## 2016-10-01 DIAGNOSIS — Z7982 Long term (current) use of aspirin: Secondary | ICD-10-CM | POA: Diagnosis not present

## 2016-10-01 DIAGNOSIS — Z01810 Encounter for preprocedural cardiovascular examination: Secondary | ICD-10-CM | POA: Diagnosis not present

## 2016-10-01 DIAGNOSIS — F17210 Nicotine dependence, cigarettes, uncomplicated: Secondary | ICD-10-CM | POA: Diagnosis present

## 2016-10-01 DIAGNOSIS — E785 Hyperlipidemia, unspecified: Secondary | ICD-10-CM | POA: Diagnosis present

## 2016-10-01 DIAGNOSIS — I2119 ST elevation (STEMI) myocardial infarction involving other coronary artery of inferior wall: Secondary | ICD-10-CM | POA: Diagnosis not present

## 2016-10-01 DIAGNOSIS — I252 Old myocardial infarction: Secondary | ICD-10-CM | POA: Diagnosis not present

## 2016-10-01 DIAGNOSIS — D62 Acute posthemorrhagic anemia: Secondary | ICD-10-CM | POA: Diagnosis not present

## 2016-10-01 DIAGNOSIS — J45909 Unspecified asthma, uncomplicated: Secondary | ICD-10-CM | POA: Diagnosis not present

## 2016-10-01 DIAGNOSIS — I2111 ST elevation (STEMI) myocardial infarction involving right coronary artery: Secondary | ICD-10-CM | POA: Diagnosis not present

## 2016-10-01 DIAGNOSIS — E119 Type 2 diabetes mellitus without complications: Secondary | ICD-10-CM | POA: Diagnosis not present

## 2016-10-01 DIAGNOSIS — I25110 Atherosclerotic heart disease of native coronary artery with unstable angina pectoris: Secondary | ICD-10-CM | POA: Diagnosis not present

## 2016-10-01 DIAGNOSIS — M1990 Unspecified osteoarthritis, unspecified site: Secondary | ICD-10-CM | POA: Diagnosis present

## 2016-10-01 DIAGNOSIS — I480 Paroxysmal atrial fibrillation: Secondary | ICD-10-CM | POA: Diagnosis not present

## 2016-10-01 DIAGNOSIS — I2510 Atherosclerotic heart disease of native coronary artery without angina pectoris: Secondary | ICD-10-CM | POA: Diagnosis present

## 2016-10-01 DIAGNOSIS — I4891 Unspecified atrial fibrillation: Secondary | ICD-10-CM | POA: Diagnosis not present

## 2016-10-01 DIAGNOSIS — I10 Essential (primary) hypertension: Secondary | ICD-10-CM | POA: Diagnosis not present

## 2016-10-01 DIAGNOSIS — E7801 Familial hypercholesterolemia: Secondary | ICD-10-CM | POA: Diagnosis not present

## 2016-10-01 DIAGNOSIS — R079 Chest pain, unspecified: Secondary | ICD-10-CM | POA: Diagnosis not present

## 2016-10-01 DIAGNOSIS — E784 Other hyperlipidemia: Secondary | ICD-10-CM | POA: Diagnosis not present

## 2016-10-01 DIAGNOSIS — Y831 Surgical operation with implant of artificial internal device as the cause of abnormal reaction of the patient, or of later complication, without mention of misadventure at the time of the procedure: Secondary | ICD-10-CM | POA: Diagnosis present

## 2016-10-01 DIAGNOSIS — Z720 Tobacco use: Secondary | ICD-10-CM | POA: Diagnosis not present

## 2016-10-01 DIAGNOSIS — R918 Other nonspecific abnormal finding of lung field: Secondary | ICD-10-CM | POA: Diagnosis not present

## 2016-10-01 DIAGNOSIS — E8770 Fluid overload, unspecified: Secondary | ICD-10-CM | POA: Diagnosis not present

## 2016-10-01 DIAGNOSIS — Z951 Presence of aortocoronary bypass graft: Secondary | ICD-10-CM | POA: Diagnosis not present

## 2016-10-01 DIAGNOSIS — I2102 ST elevation (STEMI) myocardial infarction involving left anterior descending coronary artery: Secondary | ICD-10-CM | POA: Diagnosis not present

## 2016-10-01 DIAGNOSIS — Z79899 Other long term (current) drug therapy: Secondary | ICD-10-CM

## 2016-10-01 DIAGNOSIS — T82855A Stenosis of coronary artery stent, initial encounter: Principal | ICD-10-CM | POA: Diagnosis present

## 2016-10-01 DIAGNOSIS — J9811 Atelectasis: Secondary | ICD-10-CM

## 2016-10-01 DIAGNOSIS — J90 Pleural effusion, not elsewhere classified: Secondary | ICD-10-CM | POA: Diagnosis not present

## 2016-10-01 DIAGNOSIS — Z7984 Long term (current) use of oral hypoglycemic drugs: Secondary | ICD-10-CM | POA: Diagnosis not present

## 2016-10-01 DIAGNOSIS — Z7902 Long term (current) use of antithrombotics/antiplatelets: Secondary | ICD-10-CM | POA: Diagnosis not present

## 2016-10-01 DIAGNOSIS — I110 Hypertensive heart disease with heart failure: Secondary | ICD-10-CM | POA: Diagnosis not present

## 2016-10-01 DIAGNOSIS — I509 Heart failure, unspecified: Secondary | ICD-10-CM | POA: Diagnosis not present

## 2016-10-01 DIAGNOSIS — I219 Acute myocardial infarction, unspecified: Secondary | ICD-10-CM | POA: Diagnosis present

## 2016-10-01 DIAGNOSIS — E7800 Pure hypercholesterolemia, unspecified: Secondary | ICD-10-CM | POA: Diagnosis not present

## 2016-10-01 DIAGNOSIS — I4819 Other persistent atrial fibrillation: Secondary | ICD-10-CM

## 2016-10-01 DIAGNOSIS — I214 Non-ST elevation (NSTEMI) myocardial infarction: Secondary | ICD-10-CM | POA: Diagnosis not present

## 2016-10-01 DIAGNOSIS — E440 Moderate protein-calorie malnutrition: Secondary | ICD-10-CM | POA: Insufficient documentation

## 2016-10-01 HISTORY — PX: LEFT HEART CATH AND CORONARY ANGIOGRAPHY: CATH118249

## 2016-10-01 HISTORY — PX: CORONARY BALLOON ANGIOPLASTY: CATH118233

## 2016-10-01 HISTORY — DX: Other persistent atrial fibrillation: I48.19

## 2016-10-01 LAB — TROPONIN I
TROPONIN I: 0.31 ng/mL — AB (ref <0.03)
Troponin I: 2.1 ng/mL (ref <0.03)

## 2016-10-01 LAB — GLUCOSE, CAPILLARY: GLUCOSE-CAPILLARY: 167 mg/dL — AB (ref 65–99)

## 2016-10-01 LAB — MRSA PCR SCREENING: MRSA by PCR: NEGATIVE

## 2016-10-01 SURGERY — LEFT HEART CATH AND CORONARY ANGIOGRAPHY
Anesthesia: LOCAL

## 2016-10-01 MED ORDER — HEPARIN (PORCINE) IN NACL 2-0.9 UNIT/ML-% IJ SOLN
INTRAMUSCULAR | Status: DC | PRN
  Administered 2016-10-01: 1000 mL

## 2016-10-01 MED ORDER — IOPAMIDOL (ISOVUE-370) INJECTION 76%
INTRAVENOUS | Status: AC
  Filled 2016-10-01: qty 125

## 2016-10-01 MED ORDER — ASPIRIN 81 MG PO CHEW
81.0000 mg | CHEWABLE_TABLET | Freq: Every day | ORAL | Status: DC
Start: 2016-10-01 — End: 2016-10-01

## 2016-10-01 MED ORDER — TIROFIBAN (AGGRASTAT) BOLUS VIA INFUSION
INTRAVENOUS | Status: DC | PRN
  Administered 2016-10-01: 1575 ug via INTRAVENOUS

## 2016-10-01 MED ORDER — METOPROLOL TARTRATE 50 MG PO TABS
25.0000 mg | ORAL_TABLET | Freq: Two times a day (BID) | ORAL | Status: DC
  Administered 2016-10-02 – 2016-10-03 (×2): 25 mg via ORAL
  Filled 2016-10-01 (×2): qty 1

## 2016-10-01 MED ORDER — AMIODARONE HCL IN DEXTROSE 360-4.14 MG/200ML-% IV SOLN
60.0000 mg/h | INTRAVENOUS | Status: DC
  Administered 2016-10-01: 60 mg/h via INTRAVENOUS
  Filled 2016-10-01 (×2): qty 200

## 2016-10-01 MED ORDER — TIROFIBAN HCL IN NACL 5-0.9 MG/100ML-% IV SOLN
INTRAVENOUS | Status: AC
  Filled 2016-10-01: qty 100

## 2016-10-01 MED ORDER — SODIUM CHLORIDE 0.9 % IV SOLN
INTRAVENOUS | Status: DC | PRN
  Administered 2016-10-01: 100 mL/h via INTRAVENOUS

## 2016-10-01 MED ORDER — AMIODARONE LOAD VIA INFUSION
150.0000 mg | Freq: Once | INTRAVENOUS | Status: DC
  Filled 2016-10-01: qty 83.34

## 2016-10-01 MED ORDER — FENTANYL CITRATE (PF) 100 MCG/2ML IJ SOLN
INTRAMUSCULAR | Status: DC | PRN
  Administered 2016-10-01: 50 ug via INTRAVENOUS

## 2016-10-01 MED ORDER — MIDAZOLAM HCL 2 MG/2ML IJ SOLN
INTRAMUSCULAR | Status: DC | PRN
  Administered 2016-10-01: 1 mg via INTRAVENOUS

## 2016-10-01 MED ORDER — HEPARIN SODIUM (PORCINE) 1000 UNIT/ML IJ SOLN
INTRAMUSCULAR | Status: DC | PRN
  Administered 2016-10-01: 7500 [IU] via INTRAVENOUS

## 2016-10-01 MED ORDER — ONDANSETRON HCL 4 MG/2ML IJ SOLN: 4.0000 mg | Freq: Four times a day (QID) | INTRAMUSCULAR | Status: DC | PRN

## 2016-10-01 MED ORDER — AMIODARONE HCL IN DEXTROSE 360-4.14 MG/200ML-% IV SOLN
INTRAVENOUS | Status: AC
  Filled 2016-10-01: qty 200

## 2016-10-01 MED ORDER — IOPAMIDOL (ISOVUE-370) INJECTION 76%
INTRAVENOUS | Status: DC | PRN
  Administered 2016-10-01: 115 mL via INTRA_ARTERIAL

## 2016-10-01 MED ORDER — HEPARIN (PORCINE) IN NACL 100-0.45 UNIT/ML-% IJ SOLN
900.0000 [IU]/h | INTRAMUSCULAR | Status: DC
  Administered 2016-10-02: 800 [IU]/h via INTRAVENOUS
  Administered 2016-10-03 – 2016-10-05 (×3): 900 [IU]/h via INTRAVENOUS
  Filled 2016-10-01 (×4): qty 250

## 2016-10-01 MED ORDER — ORAL CARE MOUTH RINSE
15.0000 mL | Freq: Two times a day (BID) | OROMUCOSAL | Status: DC
  Administered 2016-10-02 – 2016-10-08 (×7): 15 mL via OROMUCOSAL

## 2016-10-01 MED ORDER — AMIODARONE HCL IN DEXTROSE 360-4.14 MG/200ML-% IV SOLN
INTRAVENOUS | Status: DC | PRN
  Administered 2016-10-01: 60 mg/h via INTRAVENOUS

## 2016-10-01 MED ORDER — SODIUM CHLORIDE 0.9 % IV SOLN
INTRAVENOUS | Status: AC
  Administered 2016-10-01: 17:00:00 via INTRAVENOUS

## 2016-10-01 MED ORDER — SODIUM CHLORIDE 0.9% FLUSH: 3.0000 mL | INTRAVENOUS | Status: DC | PRN

## 2016-10-01 MED ORDER — SODIUM CHLORIDE 0.9 % IV SOLN
250.0000 mL | INTRAVENOUS | Status: DC | PRN
  Administered 2016-10-06: 12:00:00 via INTRAVENOUS

## 2016-10-01 MED ORDER — HEPARIN SODIUM (PORCINE) 1000 UNIT/ML IJ SOLN
INTRAMUSCULAR | Status: AC
  Filled 2016-10-01: qty 1

## 2016-10-01 MED ORDER — LIDOCAINE HCL (PF) 1 % IJ SOLN
INTRAMUSCULAR | Status: DC | PRN
  Administered 2016-10-01: 2 mL via SUBCUTANEOUS

## 2016-10-01 MED ORDER — AMIODARONE LOAD VIA INFUSION
INTRAVENOUS | Status: DC | PRN
  Administered 2016-10-01: 150 mg via INTRAVENOUS

## 2016-10-01 MED ORDER — TIROFIBAN HCL IV 12.5 MG/250 ML
0.0750 ug/kg/min | INTRAVENOUS | Status: DC
  Filled 2016-10-01 (×2): qty 250

## 2016-10-01 MED ORDER — OXYCODONE-ACETAMINOPHEN 5-325 MG PO TABS: 1.0000 | ORAL_TABLET | ORAL | Status: DC | PRN

## 2016-10-01 MED ORDER — ONDANSETRON HCL 4 MG/2ML IJ SOLN
INTRAMUSCULAR | Status: AC
  Filled 2016-10-01: qty 2

## 2016-10-01 MED ORDER — ONDANSETRON HCL 4 MG/2ML IJ SOLN
INTRAMUSCULAR | Status: DC | PRN
  Administered 2016-10-01 (×2): 4 mg via INTRAVENOUS

## 2016-10-01 MED ORDER — NITROGLYCERIN 1 MG/10 ML FOR IR/CATH LAB
INTRA_ARTERIAL | Status: AC
  Filled 2016-10-01: qty 10

## 2016-10-01 MED ORDER — VERAPAMIL HCL 2.5 MG/ML IV SOLN
INTRAVENOUS | Status: AC
  Filled 2016-10-01: qty 2

## 2016-10-01 MED ORDER — MIDAZOLAM HCL 2 MG/2ML IJ SOLN
INTRAMUSCULAR | Status: AC
  Filled 2016-10-01: qty 2

## 2016-10-01 MED ORDER — HYDRALAZINE HCL 20 MG/ML IJ SOLN: 5.0000 mg | INTRAMUSCULAR | Status: AC | PRN

## 2016-10-01 MED ORDER — PROMETHAZINE HCL 25 MG/ML IJ SOLN
6.2500 mg | Freq: Four times a day (QID) | INTRAMUSCULAR | Status: DC | PRN
  Administered 2016-10-01: 6.25 mg via INTRAVENOUS
  Filled 2016-10-01: qty 1

## 2016-10-01 MED ORDER — ATORVASTATIN CALCIUM 80 MG PO TABS
80.0000 mg | ORAL_TABLET | Freq: Every day | ORAL | Status: DC
  Administered 2016-10-02 – 2016-10-11 (×9): 80 mg via ORAL
  Filled 2016-10-01 (×9): qty 1

## 2016-10-01 MED ORDER — AMIODARONE HCL 150 MG/3ML IV SOLN
INTRAVENOUS | Status: AC
  Filled 2016-10-01: qty 3

## 2016-10-01 MED ORDER — SODIUM CHLORIDE 0.9% FLUSH
3.0000 mL | Freq: Two times a day (BID) | INTRAVENOUS | Status: DC
  Administered 2016-10-01 – 2016-10-05 (×7): 3 mL via INTRAVENOUS

## 2016-10-01 MED ORDER — AMIODARONE HCL IN DEXTROSE 360-4.14 MG/200ML-% IV SOLN
30.0000 mg/h | INTRAVENOUS | Status: DC
  Administered 2016-10-01 – 2016-10-03 (×5): 30 mg/h via INTRAVENOUS
  Filled 2016-10-01 (×3): qty 200

## 2016-10-01 MED ORDER — INSULIN ASPART 100 UNIT/ML ~~LOC~~ SOLN
0.0000 [IU] | Freq: Three times a day (TID) | SUBCUTANEOUS | Status: DC
  Administered 2016-10-01 – 2016-10-02 (×2): 2 [IU] via SUBCUTANEOUS
  Administered 2016-10-03 – 2016-10-04 (×3): 1 [IU] via SUBCUTANEOUS
  Administered 2016-10-05: 2 [IU] via SUBCUTANEOUS

## 2016-10-01 MED ORDER — HEPARIN (PORCINE) IN NACL 2-0.9 UNIT/ML-% IJ SOLN
INTRAMUSCULAR | Status: AC
  Filled 2016-10-01: qty 1000

## 2016-10-01 MED ORDER — TIROFIBAN HCL IN NACL 5-0.9 MG/100ML-% IV SOLN
INTRAVENOUS | Status: DC | PRN
  Administered 2016-10-01: 0.075 ug/kg/min via INTRAVENOUS

## 2016-10-01 MED ORDER — ACETAMINOPHEN 325 MG PO TABS: 650.0000 mg | ORAL_TABLET | ORAL | Status: DC | PRN

## 2016-10-01 MED ORDER — LISINOPRIL 40 MG PO TABS
40.0000 mg | ORAL_TABLET | Freq: Every day | ORAL | Status: DC
  Administered 2016-10-02 – 2016-10-03 (×2): 40 mg via ORAL
  Filled 2016-10-01: qty 2
  Filled 2016-10-01 (×2): qty 1
  Filled 2016-10-01 (×2): qty 2
  Filled 2016-10-01: qty 1

## 2016-10-01 MED ORDER — LIDOCAINE HCL (PF) 1 % IJ SOLN
INTRAMUSCULAR | Status: AC
  Filled 2016-10-01: qty 30

## 2016-10-01 MED ORDER — VERAPAMIL HCL 2.5 MG/ML IV SOLN
INTRAVENOUS | Status: DC | PRN
  Administered 2016-10-01: 10 mL via INTRA_ARTERIAL

## 2016-10-01 MED ORDER — ASPIRIN EC 81 MG PO TBEC
81.0000 mg | DELAYED_RELEASE_TABLET | Freq: Every day | ORAL | Status: DC
Start: 2016-10-01 — End: 2016-10-06
  Administered 2016-10-02 – 2016-10-05 (×4): 81 mg via ORAL
  Filled 2016-10-01 (×5): qty 1

## 2016-10-01 MED ORDER — ROSUVASTATIN CALCIUM 20 MG PO TABS: 20.0000 mg | ORAL_TABLET | Freq: Every day | ORAL | Status: DC

## 2016-10-01 MED ORDER — LABETALOL HCL 5 MG/ML IV SOLN: 10.0000 mg | INTRAVENOUS | Status: AC | PRN

## 2016-10-01 MED ORDER — FENTANYL CITRATE (PF) 100 MCG/2ML IJ SOLN
INTRAMUSCULAR | Status: AC
  Filled 2016-10-01: qty 2

## 2016-10-01 MED ORDER — TIROFIBAN HCL IN NACL 5-0.9 MG/100ML-% IV SOLN
0.0750 ug/kg/min | INTRAVENOUS | Status: DC
  Administered 2016-10-01: 0.075 ug/kg/min via INTRAVENOUS
  Filled 2016-10-01 (×2): qty 100

## 2016-10-01 SURGICAL SUPPLY — 16 items
BALLN EMERGE MR 2.5X15 (BALLOONS) ×2
BALLN ~~LOC~~ EUPHORA RX 2.75X12 (BALLOONS) ×2
BALLOON EMERGE MR 2.5X15 (BALLOONS) ×1 IMPLANT
BALLOON ~~LOC~~ EUPHORA RX 2.75X12 (BALLOONS) ×1 IMPLANT
CATH EXPO 5F FL3.5 (CATHETERS) ×2 IMPLANT
CATH VISTA GUIDE 6FR JR4 (CATHETERS) ×2 IMPLANT
ELECT DEFIB PAD ADLT CADENCE (PAD) ×2 IMPLANT
GLIDESHEATH SLEND A-KIT 6F 22G (SHEATH) ×2 IMPLANT
GUIDEWIRE INQWIRE 1.5J.035X260 (WIRE) ×2 IMPLANT
INQWIRE 1.5J .035X260CM (WIRE) ×4
KIT ENCORE 26 ADVANTAGE (KITS) ×4 IMPLANT
KIT HEART LEFT (KITS) ×2 IMPLANT
PACK CARDIAC CATHETERIZATION (CUSTOM PROCEDURE TRAY) ×2 IMPLANT
TRANSDUCER W/STOPCOCK (MISCELLANEOUS) ×2 IMPLANT
TUBING CIL FLEX 10 FLL-RA (TUBING) ×2 IMPLANT
WIRE ASAHI PROWATER 180CM (WIRE) ×2 IMPLANT

## 2016-10-01 NOTE — Progress Notes (Signed)
ANTICOAGULATION CONSULT NOTE - Initial Consult  Pharmacy Consult for Aggrastat>>heparin Indication: s/p STEMI, afib  No Known Allergies  Patient Measurements: Height: '5\' 7"'$  (170.2 cm) Weight: 129 lb 10.1 oz (58.8 kg) IBW/kg (Calculated) : 61.6 Heparin Dosing Weight:  58.8 kg  Vital Signs: Temp: 97.5 F (36.4 C) (04/08 1630) Temp Source: Oral (04/08 1630) BP: 110/79 (04/08 1645) Pulse Rate: 71 (04/08 1645)  Labs: No results for input(s): HGB, HCT, PLT, APTT, LABPROT, INR, HEPARINUNFRC, HEPRLOWMOCWT, CREATININE, CKTOTAL, CKMB, TROPONINI in the last 72 hours.  CrCl cannot be calculated (Patient's most recent lab result is older than the maximum 21 days allowed.).   Medical History: Past Medical History:  Diagnosis Date  . Arthritis   . Asthma   . Chest pain   . Diabetes mellitus   . Hypertension   . MI (myocardial infarction) 05/2002  . SOB (shortness of breath)    Assessment: CC/HPI: Transferred from [facility] for code STEMI. Afib of unknown duration.   PMH: CAD, stents,DM, HTN, HLD, arthritis, asthma, tobacco  Anticoag: STEMI, new afib  Goal of Therapy:  Heparin level 0.3-0.7 units/ml Monitor platelets by anticoagulation protocol: Yes   Plan:  Aggrastat x 18 hrs post-cath 0.075 mcg/kg/min When Aggrastat completed, start IV heparin (1100) at 800 units/hr Check heparin level 6 hrs after heparin starts. Daily CBC Daily HL starting 4/10   Gearl Kimbrough S. Alford Highland, PharmD, BCPS Clinical Staff Pharmacist Pager [phone]  Eilene Ghazi Stillinger 10/01/2016,4:52 PM

## 2016-10-01 NOTE — Progress Notes (Signed)
Chaplain responded to STEMI from [facility].  Followed paramedics with patient to Cath Lab, who advised some family members would be en route, a daughter and granddaughter, at least.  [place] ED front desk to be on lookout for family.  Family has not arrived yet.  Please call when family arrives or as needed.  [place] [phone]

## 2016-10-01 NOTE — H&P (Signed)
[name] is a 71 y.o. female  Admit Date: 10/01/2016 Referring Physician: STEMI Activation Primary Cardiologist: M. Fletcher Anon, MD Chief complaint / reason for admission: STEMI  HPI: 71 year old female with extensive prior history of coronary disease that includes prior stenting of all 3 major coronary vessels (RCA 2 overlapping; LAD; and circumflex). The patient is a known diabetic with history of hypertension and hyperlipidemia. Her last coronary intervention was 2012. At that time she had restenting of the right coronary because of in-stent restenosis. She has done relatively well until today at around 1 PM she developed severe chest burning and tightness. Emergency medical services personnel were called to her residence and the electrocardiogram demonstrated atrial fibrillation with rapid ventricular response and inferior ST segment elevation.  The patient was met in the ambulance bay where she was in severe pain with associated nausea, vomiting, diaphoresis, and dyspnea. Quick assessment demonstrated that oxygen saturations were normal. The lungs were relatively clear. The heart rate was rapid and blood pressure elevated. We proceeded straight to the cardiac catheterization laboratory.    PMH:    Past Medical History:  Diagnosis Date  . Arthritis   . Asthma   . Chest pain   . Diabetes mellitus   . Hypertension   . MI (myocardial infarction) 05/2002  . SOB (shortness of breath)     PSH:    Past Surgical History:  Procedure Laterality Date  . CHOLECYSTECTOMY     ALLERGIES:   Patient has no known allergies. Prior to Admit Meds:   Prescriptions Prior to Admission  Medication Sig Dispense Refill Last Dose  . aspirin 81 MG tablet Take 81 mg by mouth daily.     Taking  . budesonide-formoterol (SYMBICORT) 160-4.5 MCG/ACT inhaler Inhale 2 puffs into the lungs 2 (two) times daily.     Taking  . Cholecalciferol (VITAMIN D PO) Take 1.25 mg by mouth once a week.     Taking  .  clopidogrel (PLAVIX) 75 MG tablet Take 75 mg by mouth daily.     Taking  . esomeprazole (NEXIUM) 40 MG capsule Take 40 mg by mouth daily before breakfast.     Taking  . lisinopril (PRINIVIL,ZESTRIL) 40 MG tablet Take 40 mg by mouth daily.     Taking  . meloxicam (MOBIC) 7.5 MG tablet Take 7.5 mg by mouth as needed. To take 1-2 tablets daily with food for back or joint pain.    Taking  . metoprolol (LOPRESSOR) 50 MG tablet Take 50 mg by mouth 2 (two) times daily. To take 1/2 tablet twice a day.    Taking  . nitroGLYCERIN (NITROSTAT) 0.4 MG SL tablet Place 0.4 mg under the tongue every 5 (five) minutes as needed.     Taking  . rosuvastatin (CRESTOR) 20 MG tablet Take 20 mg by mouth daily.     Taking  . sitaGLIPtan-metformin (JANUMET) 50-1000 MG per tablet Take 1 tablet by mouth 2 (two) times daily with a meal.     Taking   Family HX:    Family History  Problem Relation Age of Onset  . Cancer Mother 52  . Cancer Father 28  . Heart disease Sister 62    and cancer  . Cancer Brother 73  . Heart attack Brother 28   Social HX:    Social History   Social History  . Marital status: Divorced    Spouse name: N/A  . Number of children: N/A  . Years of education:  N/A   Occupational History  . Not on file.   Social History Main Topics  . Smoking status: Current Every Day Smoker    Packs/day: 1.00    Years: 25.00    Types: Cigarettes  . Smokeless tobacco: Not on file  . Alcohol use No  . Drug use: No  . Sexual activity: Not on file   Other Topics Concern  . Not on file   Social History Narrative  . No narrative on file     ROS Under stress. Takes her medications as prescribed. No episodes of syncope or palpitation. No prior history of atrial fibrillation. 10 used to smoke cigarettes. All other systems are negative.  Physical Exam: Blood pressure 123/68, pulse 93, resp. rate 16, SpO2 95 %.    The patient is in severe discomfort, very restless and complaining of nausea with  associated vomiting. Skin is pale and clammy. HEENT exam reveals no jaundice or pallor. Pupils equal. Neck exam reveals flat jugular veins. No carotid bruits. Chest reveals rales in the left mid lung. No wheezing is heard. Cardiac exam reveals a rapid irregularly irregular rhythm without murmur or gallop. Abdomen is soft. No tenderness is noted. Bowel sounds are normal. Extremities reveal no edema. Radial pulses are 2+ and symmetric. Femoral pulses are 2+ and symmetric. Neurological exam reveals no focal motor or sensory deficits. Psychiatric exam reveals patient anxiety and restlessness.   Labs: Lab Results  Component Value Date   WBC 7.3 08/27/2010   HGB 11.6 (L) 08/27/2010   HCT 34.2 (L) 08/27/2010   MCV 86.8 08/27/2010   PLT 169 08/27/2010   No results for input(s): NA, K, CL, CO2, BUN, CREATININE, CALCIUM, PROT, BILITOT, ALKPHOS, ALT, AST, GLUCOSE in the last 168 hours.  Invalid input(s): LABALBU Lab Results  Component Value Date   CKTOTAL 80 08/26/2010   CKMB 1.4 08/26/2010   TROPONINI 0.01        NO INDICATION OF MYOCARDIAL INJURY. 08/26/2010     Radiology:  No results found.  EKG:  Atrial fibrillation with rapid ventricular response and ST elevation and II, III, and aVF  ASSESSMENT:  1. Acute inferior ST elevation myocardial infarction, possibly related to stent failure. Onset of symptoms 1 PM.  2. Atrial fibrillation with rapid ventricular response. Unknown duration. Probably related to acute ischemia. Rule out pre-existing condition.  CHADS VASC score > 4.  3. Diabetes mellitus insulin-dependent and evidence of end organ damage.  4. Tobacco abuse  Plan:   Anatomy defined reperfusion strategy based on findings of emergency catheterization.   The patient was counseled to undergo left heart catheterization, coronary angiography, and possible percutaneous coronary intervention with stent implantation. The procedural risks and benefits were discussed in  detail. The risks discussed included death, stroke, myocardial infarction, life-threatening bleeding, limb ischemia, kidney injury, allergy, and possible emergency cardiac surgery. The risk of these significant complications were estimated to occur less than 1% of the time. After discussion, the patient has agreed to proceed.  Critical care time 40 minutes  Belva Crome III 10/01/2016 4:23 PM

## 2016-10-02 ENCOUNTER — Encounter (HOSPITAL_COMMUNITY): Payer: Self-pay | Admitting: Interventional Cardiology

## 2016-10-02 ENCOUNTER — Other Ambulatory Visit: Payer: Self-pay | Admitting: *Deleted

## 2016-10-02 DIAGNOSIS — I2111 ST elevation (STEMI) myocardial infarction involving right coronary artery: Secondary | ICD-10-CM

## 2016-10-02 DIAGNOSIS — I2510 Atherosclerotic heart disease of native coronary artery without angina pectoris: Secondary | ICD-10-CM

## 2016-10-02 LAB — CBC
HCT: 34.1 % — ABNORMAL LOW (ref 36.0–46.0)
Hemoglobin: 11.1 g/dL — ABNORMAL LOW (ref 12.0–15.0)
MCH: 29.4 pg (ref 26.0–34.0)
MCHC: 32.6 g/dL (ref 30.0–36.0)
MCV: 90.2 fL (ref 78.0–100.0)
PLATELETS: 177 10*3/uL (ref 150–400)
RBC: 3.78 MIL/uL — AB (ref 3.87–5.11)
RDW: 13.3 % (ref 11.5–15.5)
WBC: 8.6 10*3/uL (ref 4.0–10.5)

## 2016-10-02 LAB — HEMOGLOBIN A1C
HEMOGLOBIN A1C: 7 % — AB (ref 4.8–5.6)
Mean Plasma Glucose: 154 mg/dL

## 2016-10-02 LAB — POCT I-STAT, CHEM 8
BUN: 24 mg/dL — AB (ref 6–20)
CALCIUM ION: 1.24 mmol/L (ref 1.15–1.40)
Chloride: 104 mmol/L (ref 101–111)
Creatinine, Ser: 1.1 mg/dL — ABNORMAL HIGH (ref 0.44–1.00)
Glucose, Bld: 222 mg/dL — ABNORMAL HIGH (ref 65–99)
HCT: 38 % (ref 36.0–46.0)
Hemoglobin: 12.9 g/dL (ref 12.0–15.0)
Potassium: 3.5 mmol/L (ref 3.5–5.1)
SODIUM: 140 mmol/L (ref 135–145)
TCO2: 24 mmol/L (ref 0–100)

## 2016-10-02 LAB — GLUCOSE, CAPILLARY
GLUCOSE-CAPILLARY: 109 mg/dL — AB (ref 65–99)
GLUCOSE-CAPILLARY: 116 mg/dL — AB (ref 65–99)
Glucose-Capillary: 115 mg/dL — ABNORMAL HIGH (ref 65–99)
Glucose-Capillary: 152 mg/dL — ABNORMAL HIGH (ref 65–99)

## 2016-10-02 LAB — BASIC METABOLIC PANEL
Anion gap: 7 (ref 5–15)
BUN: 16 mg/dL (ref 6–20)
CHLORIDE: 108 mmol/L (ref 101–111)
CO2: 24 mmol/L (ref 22–32)
CREATININE: 0.87 mg/dL (ref 0.44–1.00)
Calcium: 8.6 mg/dL — ABNORMAL LOW (ref 8.9–10.3)
GFR calc Af Amer: >60 mL/min (ref >60)
GFR calc non Af Amer: >60 mL/min (ref >60)
Glucose, Bld: 114 mg/dL — ABNORMAL HIGH (ref 65–99)
Potassium: 3.9 mmol/L (ref 3.5–5.1)
Sodium: 139 mmol/L (ref 135–145)

## 2016-10-02 LAB — TROPONIN I: TROPONIN I: 2.11 ng/mL — AB (ref <0.03)

## 2016-10-02 LAB — PLATELET INHIBITION P2Y12

## 2016-10-02 LAB — POCT ACTIVATED CLOTTING TIME
ACTIVATED CLOTTING TIME: 312 s
Activated Clotting Time: 456 seconds

## 2016-10-02 LAB — HEPARIN LEVEL (UNFRACTIONATED): Heparin Unfractionated: 0.27 IU/mL — ABNORMAL LOW (ref 0.30–0.70)

## 2016-10-02 MED FILL — Nitroglycerin IV Soln 100 MCG/ML in D5W: INTRA_ARTERIAL | Qty: 10 | Status: AC

## 2016-10-02 MED FILL — Amiodarone HCl Inj 150 MG/3ML (50 MG/ML): INTRAVENOUS | Qty: 3 | Status: AC

## 2016-10-02 NOTE — Progress Notes (Signed)
Progress Note  Patient Name: [name] Date of Encounter: 10/02/2016  Primary Cardiologist: Fletcher Anon  Subjective   No chest pain right wrist a little sore   Inpatient Medications    Scheduled Meds: . amiodarone  150 mg Intravenous Once  . aspirin EC  81 mg Oral Daily  . atorvastatin  80 mg Oral q1800  . insulin aspart  0-9 Units Subcutaneous TID WC  . lisinopril  40 mg Oral Daily  . mouth rinse  15 mL Mouth Rinse BID  . metoprolol  25 mg Oral BID  . sodium chloride flush  3 mL Intravenous Q12H   Continuous Infusions: . amiodarone 30 mg/hr (10/02/16 0509)  . heparin    . tirofiban 0.075 mcg/mL (10/01/16 2000)   PRN Meds: sodium chloride, acetaminophen, ondansetron (ZOFRAN) IV, oxyCODONE-acetaminophen, promethazine, sodium chloride flush   Vital Signs    Vitals:   10/02/16 0500 10/02/16 0600 10/02/16 0742 10/02/16 0752  BP: (!) 112/40 (!) 109/47    Pulse: 74 (!) 55  (!) 54  Resp: '16 11  12  '$ Temp:   97.8 F (36.6 C)   TempSrc:   Oral   SpO2: 98% 95%  98%  Weight:      Height:        Intake/Output Summary (Last 24 hours) at 10/02/16 0758 Last data filed at 10/02/16 0600  Gross per 24 hour  Intake           1189.5 ml  Output              550 ml  Net            639.5 ml   Filed Weights   10/01/16 1600  Weight: 129 lb 10.1 oz (58.8 kg)    Telemetry    NSR rates 55-65 10/02/2016  - Personally Reviewed  ECG    NSR normal 10/02/2016  - Personally Reviewed  Physical Exam   GEN: No acute distress.   Neck: No JVD Cardiac: RRR, no murmurs, rubs, or gallops.  Respiratory: Clear to auscultation bilaterally. GI: Soft, nontender, non-distended  MS: No edema; No deformity. Neuro:  Nonfocal  Psych: Normal affect  Right radial cath sight A   Labs    Chemistry  Recent Labs Lab 10/02/16 0451  NA 139  K 3.9  CL 108  CO2 24  GLUCOSE 114*  BUN 16  CREATININE 0.87  CALCIUM 8.6*  GFRNONAA >60  GFRAA >60  ANIONGAP 7     Hematology  Recent  Labs Lab 10/02/16 0451  WBC 8.6  RBC 3.78*  HGB 11.1*  HCT 34.1*  MCV 90.2  MCH 29.4  MCHC 32.6  RDW 13.3  PLT 177    Cardiac Enzymes  Recent Labs Lab 10/01/16 1645 10/01/16 2201 10/02/16 0451  TROPONINI 0.31* 2.10* 2.11*   No results for input(s): TROPIPOC in the last 168 hours.     Radiology    No results found.  Cardiac Studies   Cath:  Films personally reviewed  Acute inferior ST elevation myocardial infarction due to in-stent restenosis/late stent thrombosis within the mid segment of the right coronary which has 2 layers of previously placed stent.  Successful angioplasty of the mid right coronary segment improving flow from TIMI grade 2 to TIMI grade 3 with resolution of symptoms. Additional stenting was not performed.  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 A large diagonal is jailed.  Severe in-stent restenosis  in the circumflex with 90% obstruction and the large first obtuse marginal. Total occlusion of the smaller second obtuse marginal.  Severe inferior wall hypokinesis. Ejection fraction 50-55%. Normal LVEDP.  RECOMMENDATIONS:   TCTS consultation to consider multivessel bypass grafting.  Hold Plavix  Aggrastat for 18 hours  IV amiodarone to convert atrial fibrillation  IV heparin for atrial fibrillation after Aggrastat discontinued.  Patient Profile     71 y.o. female admitted with acute inferior ST elevation MI  troponin peak 2.11. Mid RCA PCI No additional stenting over previous two layers of stents Residual multivessel disease. CVTS consulted    Assessment & Plan    MI:  Aggrestat complete on heparin  On plavix prior to admission check P2Y ECG normalized No pain. Awaiting CVTS consult  PAF:  Converted to NSR on heparin and amiodarone Chol:  On high dose statin   Signed, Jenkins Rouge, MD  10/02/2016, 7:58 AM

## 2016-10-02 NOTE — Consult Note (Signed)
Reason for Consult:3 vessel CAD s/p MI Referring Physician: Dr. Monico Hoar [name] is an 72 y.o. female.  HPI: 71 yo woman with multiple CRF and known CAD presented with acute onset of severe CP radiating to back and left arm. Past history significant for hypertension, hyperlipidemia, type II diabetes, and tobacco abuse. Has had multiple PCI, first was in 2001, most recent in 2012. 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 EMS called. Noted to be in atrial fib with RVR and had inferior ST elevation. Taken emergently to cath lab. There was a high grade ISR in RCA with TIMI 2 flow. Angioplasty performed with improvement to TIMI-3 flow and resolution of symptoms. R/I for MI with troponin of 2.11.  Currently pain free.  She was on plavix prior to admission.  Past Medical History:  Diagnosis Date  . Arthritis   . Asthma   . Chest pain   . Diabetes mellitus   . Hypertension   . MI (myocardial infarction) 05/2002  . SOB (shortness of breath)     Past Surgical History:  Procedure Laterality Date  . CHOLECYSTECTOMY    . CORONARY BALLOON ANGIOPLASTY N/A 10/01/2016   Procedure: Coronary Balloon Angioplasty;  Surgeon: Belva Crome, MD;  Location: [facility];  Service: Cardiovascular;  Laterality: N/A;  . LEFT HEART CATH AND CORONARY ANGIOGRAPHY N/A 10/01/2016   Procedure: Left Heart Cath and Coronary Angiography;  Surgeon: Belva Crome, MD;  Location: [facility];  Service: Cardiovascular;  Laterality: N/A;    Family History  Problem Relation Age of Onset  . Cancer Mother 9  . Cancer Father 15  . Heart disease Sister 59    and cancer  . Cancer Brother 43  . Heart attack Brother 25    Social History:  reports that she has been smoking Cigarettes.  She has a 25.00 pack-year smoking history. She does  not have any smokeless tobacco history on file. She reports that she does not drink alcohol or use drugs.  Allergies:  Allergies  Allergen Reactions  . Ergocalciferol Nausea And Vomiting  . Niacin And Related Other (See Comments)    Burning feeling to face    Medications:  Scheduled: . amiodarone  150 mg Intravenous Once  . aspirin EC  81 mg Oral Daily  . atorvastatin  80 mg Oral q1800  . insulin aspart  0-9 Units Subcutaneous TID WC  . lisinopril  40 mg Oral Daily  . mouth rinse  15 mL Mouth Rinse BID  . metoprolol  25 mg Oral BID  . sodium chloride flush  3 mL Intravenous Q12H    Results for orders placed or performed during the hospital encounter of 10/01/16 (from the past 48 hour(s))  I-STAT, chem 8     Status: Abnormal   Collection Time: 10/01/16  2:48 PM  Result Value Ref Range   Sodium 140 135 - 145 mmol/L   Potassium 3.5 3.5 - 5.1 mmol/L   Chloride 104 101 - 111 mmol/L   BUN 24 (H) 6 - 20 mg/dL   Creatinine, Ser 1.10 (H) 0.44 - 1.00 mg/dL   Glucose, Bld 222 (H) 65 - 99 mg/dL   Calcium, Ion 1.24 1.15 - 1.40 mmol/L   TCO2 24 0 - 100 mmol/L   Hemoglobin  12.9 12.0 - 15.0 g/dL   HCT 38.0 36.0 - 46.0 %  POCT Activated clotting time     Status: None   Collection Time: 10/01/16  2:54 PM  Result Value Ref Range   Activated Clotting Time 456 seconds  POCT Activated clotting time     Status: None   Collection Time: 10/01/16  3:03 PM  Result Value Ref Range   Activated Clotting Time 312 seconds  MRSA PCR Screening     Status: None   Collection Time: 10/01/16  4:42 PM  Result Value Ref Range   MRSA by PCR NEGATIVE NEGATIVE    Comment:        The GeneXpert MRSA Assay (FDA approved for NASAL specimens only), is one component of a comprehensive MRSA colonization surveillance program. It is not intended to diagnose MRSA infection nor to guide or monitor treatment for MRSA infections.   Troponin I (serum)     Status: Abnormal   Collection Time: 10/01/16  4:45 PM   Result Value Ref Range   Troponin I 0.31 (HH) <0.03 ng/mL    Comment: CRITICAL RESULT CALLED TO, READ BACK BY AND VERIFIED WITH: J. MILFORD RN [id number] GREEN R   Hemoglobin A1c     Status: Abnormal   Collection Time: 10/01/16  4:45 PM  Result Value Ref Range   Hgb A1c MFr Bld 7.0 (H) 4.8 - 5.6 %    Comment: (NOTE)         Pre-diabetes: 5.7 - 6.4         Diabetes: >6.4         Glycemic control for adults with diabetes: <7.0    Mean Plasma Glucose 154 mg/dL    Comment: (NOTE) Performed At: [facility] [address] [place], [place] [id number] [name] MD NM:[id number]   Glucose, capillary     Status: Abnormal   Collection Time: 10/01/16  5:02 PM  Result Value Ref Range   Glucose-Capillary 167 (H) 65 - 99 mg/dL   Comment 1 K   Troponin I (serum)     Status: Abnormal   Collection Time: 10/01/16 10:01 PM  Result Value Ref Range   Troponin I 2.10 (HH) <0.03 ng/mL    Comment: CRITICAL RESULT CALLED TO, READ BACK BY AND VERIFIED WITH: DUNN,J RN 10/01/2016 2300 JORDANS   Troponin I (serum)     Status: Abnormal   Collection Time: 10/02/16  4:51 AM  Result Value Ref Range   Troponin I 2.11 (HH) <0.03 ng/mL    Comment: CRITICAL VALUE NOTED.  VALUE IS CONSISTENT WITH PREVIOUSLY REPORTED AND CALLED VALUE.  Basic metabolic panel     Status: Abnormal   Collection Time: 10/02/16  4:51 AM  Result Value Ref Range   Sodium 139 135 - 145 mmol/L   Potassium 3.9 3.5 - 5.1 mmol/L   Chloride 108 101 - 111 mmol/L   CO2 24 22 - 32 mmol/L   Glucose, Bld 114 (H) 65 - 99 mg/dL   BUN 16 6 - 20 mg/dL   Creatinine, Ser 0.87 0.44 - 1.00 mg/dL   Calcium 8.6 (L) 8.9 - 10.3 mg/dL   GFR calc non Af Amer >60 >60 mL/min   GFR calc Af Amer >60 >60 mL/min    Comment: (NOTE) The eGFR has been calculated using the CKD EPI equation. This calculation has not been validated in all clinical situations. eGFR's persistently <60 mL/min signify possible Chronic Kidney Disease.    Anion gap  7  5 - 15  CBC     Status: Abnormal   Collection Time: 10/02/16  4:51 AM  Result Value Ref Range   WBC 8.6 4.0 - 10.5 K/uL   RBC 3.78 (L) 3.87 - 5.11 MIL/uL   Hemoglobin 11.1 (L) 12.0 - 15.0 g/dL   HCT 34.1 (L) 36.0 - 46.0 %   MCV 90.2 78.0 - 100.0 fL   MCH 29.4 26.0 - 34.0 pg   MCHC 32.6 30.0 - 36.0 g/dL   RDW 13.3 11.5 - 15.5 %   Platelets 177 150 - 400 K/uL  Glucose, capillary     Status: Abnormal   Collection Time: 10/02/16  7:40 AM  Result Value Ref Range   Glucose-Capillary 115 (H) 65 - 99 mg/dL   Comment 1 F   Platelet inhibition p2y12 (Not at [facility])     Status: None   Collection Time: 10/02/16  8:31 AM  Result Value Ref Range   Platelet Function  P2Y12 RESULTS UNAVAILABLE DUE TO INTERFERING SUBSTANCE 194 - 418 PRU    Comment: J CLEVER,RN [id number] [facility]        The literature has shown a direct correlation of PRU values over 230 with higher risks of thrombotic events.  Lower PRU values are associated with platelet inhibition.   Glucose, capillary     Status: Abnormal   Collection Time: 10/02/16 11:30 AM  Result Value Ref Range   Glucose-Capillary 109 (H) 65 - 99 mg/dL   Comment 1 F     No results found.  Review of Systems  Constitutional: Positive for diaphoresis. Negative for chills and fever.  Eyes: Negative for blurred vision and double vision.  Respiratory: Positive for cough and shortness of breath. Negative for wheezing.   Cardiovascular: Positive for chest pain. Negative for orthopnea and claudication.  Gastrointestinal: Positive for nausea and vomiting. Negative for blood in stool.  Genitourinary: Positive for dysuria and frequency.  Musculoskeletal: Positive for joint pain. Negative for myalgias.  Neurological: Negative for focal weakness, seizures and loss of consciousness.  Endo/Heme/Allergies: Bruises/bleeds easily.   Blood pressure 122/63, pulse (!) 51, temperature 98.1 F (36.7 C), temperature source Oral, resp. rate 11, height 5' 7" (1.702  m), weight 129 lb 10.1 oz (58.8 kg), SpO2 97 %. Physical Exam  Vitals reviewed. Constitutional: She is oriented to person, place, and time. She appears well-developed and well-nourished. No distress.  HENT:  Head: Normocephalic and atraumatic.  Mouth/Throat: No oropharyngeal exudate.  Eyes: Conjunctivae and EOM are normal. No scleral icterus.  Neck: No thyromegaly present.  No carotid bruits  Cardiovascular: Normal rate, regular rhythm and normal heart sounds.  Exam reveals no gallop and no friction rub.   No murmur heard. Respiratory: Effort normal and breath sounds normal. No respiratory distress. She has no wheezes. She has no rales.  GI: Soft. She exhibits no distension. There is no tenderness.  Musculoskeletal: She exhibits no edema or deformity.  Lymphadenopathy:    She has no cervical adenopathy.  Neurological: She is alert and oriented to person, place, and time. No cranial nerve deficit.  No focal motor weakness  Skin: Skin is warm and dry.  Psychiatric: She has a normal mood and affect.   CARDIAC CATHETERIZATION Conclusion    Acute inferior ST elevation myocardial infarction due to in-stent restenosis/late stent thrombosis within the mid segment of the right coronary which has 2 layers of previously placed stent.  Successful angioplasty of the mid right coronary segment improving flow from TIMI grade 2  to TIMI grade 3 with resolution of symptoms. Additional stenting was not performed.  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 A large diagonal is jailed.  Severe in-stent restenosis in the circumflex with 90% obstruction and the large first obtuse marginal. Total occlusion of the smaller second obtuse marginal.  Severe inferior wall hypokinesis. Ejection fraction 50-55%. Normal LVEDP.  RECOMMENDATIONS:   TCTS consultation to consider multivessel bypass grafting.  Hold Plavix  Aggrastat for 18 hours  IV  amiodarone to convert atrial fibrillation  IV heparin for atrial fibrillation after Aggrastat discontinued.   I personally reviewed the catheterization films and concur with the findings noted above  Assessment/Plan: 71 yo woman with multiple CRF and long standing CAD presents with a STEMI treated emergently with PCI to reestablish flow. She has residual 3 vessel CAD. EF is 50%. CABG indicated for survival benefit and relief of symptoms.  I discussed the general nature of the procedure, the need for general anesthesia, the use of cardiopulmonary bypass, and the incisions to be used with Ms. Rodman and her family. We discussed the expected hospital stay, overall recovery and short and long term outcomes. I reviewed the indications, risks, benefits and alternatives. They understand the risks include, but are not limited to death, stroke, MI, DVT/PE, bleeding, possible need for transfusion, infections, cardiac arrhythmias, as well as other organ system dysfunction including respiratory, renal, or GI complications.  She accepts the risks and agrees to proceed.  She was on plavix PTA. Now stopped. P2Y12 pending. Anticipate surgery on Friday 4/13.  Jerris Fleer C Milta Croson 10/02/2016, 1:55 PM     

## 2016-10-02 NOTE — Progress Notes (Signed)
Dr. Jeannine Boga paged regarding patients sustained HR of 50. IV amio currently infusing at the '30mg'$  dose, and nurse questioning if we need to discontinue IV gtt and switch to oral.  No new orders received at this time.

## 2016-10-02 NOTE — Progress Notes (Signed)
CARDIAC REHAB PHASE I   PRE:  Rate/Rhythm: 67 SB  BP:  Supine: 127/81  Sitting:   Standing:    SaO2: 99%RA  MODE:  Ambulation: to chair  ft   POST:  Rate/Rhythm: 57 SB  BP:  Supine:   Sitting: 96/72  Standing:    SaO2: 98%RA 1420-1500 Did not walk with pt as she had not been up to chair. Assisted to recliner and BP lower. Pt denied dizziness. Has call light. Discussed with pt the importance of walking after surgery. Reviewed sternal precautions. Gave pt care guide and wrote down how to view pre op video. Pt stated she will need rehab after surgery as she lives alone. Discussed smoking cessation and gave fake cigarette. Pt stated she quit cold [place] for 5 years once. Pt stated it felt good to be OOB. Will follow up tomorrow.    Graylon Good, RN BSN  10/02/2016 2:55 PM

## 2016-10-02 NOTE — Progress Notes (Signed)
ANTICOAGULATION CONSULT NOTE - Follow-up Consult  Pharmacy Consult for Aggrastat>>heparin Indication: s/p STEMI, afib  Allergies  Allergen Reactions  . Ergocalciferol Nausea And Vomiting  . Niacin And Related Other (See Comments)    Burning feeling to face   Patient Measurements: Height: '5\' 7"'$  (170.2 cm) Weight: 129 lb 10.1 oz (58.8 kg) IBW/kg (Calculated) : 61.6 Heparin Dosing Weight:  58.8 kg  Vital Signs: Temp: 98.1 F (36.7 C) (04/09 1553) Temp Source: Oral (04/09 1553) BP: 124/69 (04/09 1700) Pulse Rate: 52 (04/09 1700)  Labs:  Recent Labs  10/01/16 1448 10/01/16 1645 10/01/16 2201 10/02/16 0451 10/02/16 1744  HGB 12.9  --   --  11.1*  --   HCT 38.0  --   --  34.1*  --   PLT  --   --   --  177  --   HEPARINUNFRC  --   --   --   --  0.27*  CREATININE 1.10*  --   --  0.87  --   TROPONINI  --  0.31* 2.10* 2.11*  --    Estimated Creatinine Clearance: 54.3 mL/min (by C-G formula based on SCr of 0.87 mg/dL).  Medical History: Past Medical History:  Diagnosis Date  . Arthritis   . Asthma   . Chest pain   . Diabetes mellitus   . Hypertension   . MI (myocardial infarction) 05/2002  . SOB (shortness of breath)    Assessment: CC/HPI: Transferred from [facility] for code STEMI. s/p cath. Aggrastat post-cath stopped this morning ~0900. Heparin resumed for CABG 4/13. HgB low this morning 11.1, PLT 177; No infusion or bleeding reported;  Heparin level: 0.27  PMH: CAD, stents,DM, HTN, HLD, arthritis, asthma, tobacco  Anticoag: STEMI, new afib  Goal of Therapy:  Heparin level 0.3-0.7 units/ml Monitor platelets by anticoagulation protocol: Yes   Plan:  Increase heparin gtt to 900 units/hr  Check heparin level 6 hrs  Daily CBC/ heparin level   Georga Bora, PharmD Clinical Pharmacist Pager: [phone] 10/02/2016 7:04 PM

## 2016-10-03 ENCOUNTER — Other Ambulatory Visit (HOSPITAL_COMMUNITY): Payer: Medicaid Other

## 2016-10-03 DIAGNOSIS — Z720 Tobacco use: Secondary | ICD-10-CM

## 2016-10-03 DIAGNOSIS — E7801 Familial hypercholesterolemia: Secondary | ICD-10-CM

## 2016-10-03 LAB — BASIC METABOLIC PANEL
Anion gap: 10 (ref 5–15)
BUN: 13 mg/dL (ref 6–20)
CO2: 26 mmol/L (ref 22–32)
CREATININE: 1.01 mg/dL — AB (ref 0.44–1.00)
Calcium: 9.1 mg/dL (ref 8.9–10.3)
Chloride: 105 mmol/L (ref 101–111)
GFR calc Af Amer: >60 mL/min (ref >60)
GFR, EST NON AFRICAN AMERICAN: 54 mL/min — AB (ref >60)
Glucose, Bld: 104 mg/dL — ABNORMAL HIGH (ref 65–99)
Potassium: 4 mmol/L (ref 3.5–5.1)
SODIUM: 141 mmol/L (ref 135–145)

## 2016-10-03 LAB — HEPARIN LEVEL (UNFRACTIONATED): HEPARIN UNFRACTIONATED: 0.44 [IU]/mL (ref 0.30–0.70)

## 2016-10-03 LAB — GLUCOSE, CAPILLARY
GLUCOSE-CAPILLARY: 126 mg/dL — AB (ref 65–99)
Glucose-Capillary: 123 mg/dL — ABNORMAL HIGH (ref 65–99)
Glucose-Capillary: 100 mg/dL — ABNORMAL HIGH (ref 65–99)
Glucose-Capillary: 121 mg/dL — ABNORMAL HIGH (ref 65–99)

## 2016-10-03 LAB — CBC
HCT: 34.8 % — ABNORMAL LOW (ref 36.0–46.0)
Hemoglobin: 11.4 g/dL — ABNORMAL LOW (ref 12.0–15.0)
MCH: 29.2 pg (ref 26.0–34.0)
MCHC: 32.8 g/dL (ref 30.0–36.0)
MCV: 89.2 fL (ref 78.0–100.0)
Platelets: 177 10*3/uL (ref 150–400)
RBC: 3.9 MIL/uL (ref 3.87–5.11)
RDW: 13.3 % (ref 11.5–15.5)
WBC: 7.4 10*3/uL (ref 4.0–10.5)

## 2016-10-03 LAB — PLATELET INHIBITION P2Y12: PLATELET FUNCTION P2Y12: 245 [PRU] (ref 194–418)

## 2016-10-03 NOTE — Progress Notes (Addendum)
Progress Note  Patient Name: [name] Date of Encounter: 10/03/2016  Primary Cardiologist: Fletcher Anon   Subjective   Postop day #2 RCA intervention in setting of inferior STEMI with multiple stents placed in the past. A new stent was not placed in the mid RCA because of 2 layers of stent already existing there. She sets stenting of her circumflex and LAD as well. She remains on IV heparin pain free. She had a low-grade troponin leak and has preserved LV function. She denies chest pain. She is scheduled for coronary artery bypass grafting by Dr. Roxan Hockey on Friday morning.  Inpatient Medications    Scheduled Meds: . amiodarone  150 mg Intravenous Once  . aspirin EC  81 mg Oral Daily  . atorvastatin  80 mg Oral q1800  . insulin aspart  0-9 Units Subcutaneous TID WC  . lisinopril  40 mg Oral Daily  . mouth rinse  15 mL Mouth Rinse BID  . metoprolol  25 mg Oral BID  . sodium chloride flush  3 mL Intravenous Q12H   Continuous Infusions: . amiodarone 30 mg/hr (10/03/16 0900)  . heparin 900 Units/hr (10/02/16 1910)   PRN Meds: sodium chloride, acetaminophen, ondansetron (ZOFRAN) IV, oxyCODONE-acetaminophen, promethazine, sodium chloride flush   Vital Signs    Vitals:   10/03/16 0700 10/03/16 0745 10/03/16 0800 10/03/16 0814  BP: 132/67  (!) 143/65   Pulse: (!) 52  (!) 58   Resp: 16  11   Temp:  97.8 F (36.6 C)  98 F (36.7 C)  TempSrc:  Oral  Oral  SpO2: 93%  98%   Weight:      Height:        Intake/Output Summary (Last 24 hours) at 10/03/16 0950 Last data filed at 10/03/16 0900  Gross per 24 hour  Intake           807.39 ml  Output             1900 ml  Net         -1092.61 ml   Filed Weights   10/01/16 1600  Weight: 129 lb 10.1 oz (58.8 kg)    Telemetry    Sinus rhythm - Personally Reviewed  ECG    Not performed today - Personally Reviewed  Physical Exam   GEN: No acute distress.   Neck: No JVD Cardiac: RRR, no murmurs, rubs, or gallops.    Respiratory: Clear to auscultation bilaterally. GI: Soft, nontender, non-distended  MS: No edema; No deformity. Neuro:  Nonfocal  Psych: Normal affect   Labs    Chemistry Recent Labs Lab 10/01/16 1448 10/02/16 0451 10/03/16 0526  NA 140 139 141  K 3.5 3.9 4.0  CL 104 108 105  CO2  --  24 26  GLUCOSE 222* 114* 104*  BUN 24* 16 13  CREATININE 1.10* 0.87 1.01*  CALCIUM  --  8.6* 9.1  GFRNONAA  --  >60 54*  GFRAA  --  >60 >60  ANIONGAP  --  7 10     Hematology Recent Labs Lab 10/01/16 1448 10/02/16 0451 10/03/16 0526  WBC  --  8.6 7.4  RBC  --  3.78* 3.90  HGB 12.9 11.1* 11.4*  HCT 38.0 34.1* 34.8*  MCV  --  90.2 89.2  MCH  --  29.4 29.2  MCHC  --  32.6 32.8  RDW  --  13.3 13.3  PLT  --  177 177    Cardiac Enzymes Recent Labs  Lab 10/01/16 1645 10/01/16 2201 10/02/16 0451  TROPONINI 0.31* 2.10* 2.11*   No results for input(s): TROPIPOC in the last 168 hours.   BNPNo results for input(s): BNP, PROBNP in the last 168 hours.   DDimer No results for input(s): DDIMER in the last 168 hours.   Radiology    No results found.  Cardiac Studies   Cath:   Conclusion    Acute inferior ST elevation myocardial infarction due to in-stent restenosis/late stent thrombosis within the mid segment of the right coronary which has 2 layers of previously placed stent.  Successful angioplasty of the mid right coronary segment improving flow from TIMI grade 2 to TIMI grade 3 with resolution of symptoms. Additional stenting was not performed.  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 A large diagonal is jailed.  Severe in-stent restenosis in the circumflex with 90% obstruction and the large first obtuse marginal. Total occlusion of the smaller second obtuse marginal.  Severe inferior wall hypokinesis. Ejection fraction 50-55%. Normal LVEDP.  RECOMMENDATIONS:   TCTS consultation to consider multivessel  bypass grafting.  Hold Plavix  Aggrastat for 18 hours  IV amiodarone to convert atrial fibrillation  IV heparin for atrial fibrillation after Aggrastat discontinued.     Patient Profile     71 y.o. female history of previous stents in her RCA multiple times, LAD and circumflex inferior STEMI on Sunday, 10/01/16, treated with RCA POBA by Dr. Tamala Julian radially. She has preserved LV function. She does have an occluded circumflex beyond the stented first obtuse marginal branch and high-grade segmental proximal mid LAD "in-stent restenosis. Dr. Tamala Julian restored TIMI-3 flow to her RCA. She's been pain-free. Her troponin leak was low. She remains on IV heparin. Plavix was discontinued. She scheduled for coronary artery bypass grafting on Friday.  Assessment & Plan    1: Inferior STEMI-postop day 2 inferior STEMI treated with angioplasty of an occluded mid dominant RCA secondary to "in-stent restenosis with overlying thrombus. Dr. Tamala Julian restored TIMI-3 flow. Her pain resolved. Her troponin leak was minimal at 2. She has preserved LV function. She has diffuse disease in the LAD and circumflex as well. Plan coronary artery bypass grafting on Friday morning by Dr. Roxan Hockey after Plavix washout.  2: Tobacco abuse-long history of tobacco abuse probably recalcitrant to risk factor modification  3: Hyperlipidemia-on statin therapy  4: Atrial fibrillation-converted to sinus rhythm on IV amiodarone which we will continue through her postoperative period.  [name], MD  10/03/2016, 9:50 AM

## 2016-10-03 NOTE — Progress Notes (Signed)
CARDIAC REHAB PHASE I   PRE:  Rate/Rhythm: 53 SB  BP:  Supine: 133/89  Sitting:   Standing:    SaO2: 97%RA  MODE:  Ambulation: 350 ft   POST:  Rate/Rhythm: 64 SR  BP:  Supine:   Sitting: 128/62  Standing:    SaO2: 99%RA 1417-1445 Changed pt's gown to manage IV lines better. Pt very happy to walk. Pt pushed one IV pole and I managed the other IV pole and monitor. Pt tolerated well. To recliner with call bell. Gave OHS booklet and finished pre op ed. Pt to watch pre op video. Pt does not have IS yet. Discussed importance of IS and mobility after surgery. Walked 350 ft with steady gait. No CP.   Graylon Good, RN BSN  10/03/2016 2:57 PM

## 2016-10-03 NOTE — Care Management Note (Signed)
Case Management Note  Patient Details  Name: [name] MRN: [id number] Date of Birth: 1946/01/22  Subjective/Objective:                 Patient admitted from home with MI, emergent cath. Continues Hep gtt at this time. Medication coverage through Medicaid.    Action/Plan:  CM will continue to follow for DC needs.   Expected Discharge Date:                  Expected Discharge Plan:     In-House Referral:     Discharge planning Services  CM Consult  Post Acute Care Choice:    Choice offered to:     DME Arranged:    DME Agency:     HH Arranged:    HH Agency:     Status of Service:  In process, will continue to follow  If discussed at Long Length of Stay Meetings, dates discussed:    Additional Comments:  Carles Collet, RN 10/03/2016, 3:21 PM

## 2016-10-03 NOTE — Progress Notes (Signed)
ANTICOAGULATION CONSULT NOTE - Follow-up Consult  Pharmacy Consult for Aggrastat>>heparin Indication: s/p STEMI, afib> planned CABG  Allergies  Allergen Reactions  . Ergocalciferol Nausea And Vomiting  . Niacin And Related Other (See Comments)    Burning feeling to face   Patient Measurements: Height: '5\' 7"'$  (170.2 cm) Weight: 129 lb 10.1 oz (58.8 kg) IBW/kg (Calculated) : 61.6 Heparin Dosing Weight:  58.8 kg  Vital Signs: Temp: 98 F (36.7 C) (04/10 0814) Temp Source: Oral (04/10 0814) BP: 143/65 (04/10 0800) Pulse Rate: 58 (04/10 0800)  Labs:  Recent Labs  10/01/16 1448 10/01/16 1645 10/01/16 2201 10/02/16 0451 10/02/16 1744 10/03/16 0526  HGB 12.9  --   --  11.1*  --  11.4*  HCT 38.0  --   --  34.1*  --  34.8*  PLT  --   --   --  177  --  177  HEPARINUNFRC  --   --   --   --  0.27* 0.44  CREATININE 1.10*  --   --  0.87  --  1.01*  TROPONINI  --  0.31* 2.10* 2.11*  --   --    Estimated Creatinine Clearance: 46.7 mL/min (A) (by C-G formula based on SCr of 1.01 mg/dL (H)).  Medical History: Past Medical History:  Diagnosis Date  . Arthritis   . Asthma   . Chest pain   . Diabetes mellitus   . Hypertension   . MI (myocardial infarction) 05/2002  . SOB (shortness of breath)    Assessment: CC/HPI: Transferred from [facility] for code STEMI. s/p cath plan CABG this week, also new Afib.   Aggrastat post-cath stopped 4/9 and transitioned to heparin drip 900 uts/hr HL 0.44 at goal, CBC slightly lower than admit still ok - will watch No infusion or bleeding reported;  PMH: CAD, stents,DM, HTN, HLD, arthritis, asthma, tobacco    Goal of Therapy:  Heparin level 0.3-0.7 units/ml Monitor platelets by anticoagulation protocol: Yes   Plan:  Continue  heparin gtt to 900 units/hr  Daily CBC/ heparin level   Bonnita Nasuti Pharm.D. CPP, BCPS Clinical Pharmacist [phone] 10/03/2016 3:04 PM    10/03/2016 9:51 AM

## 2016-10-04 ENCOUNTER — Inpatient Hospital Stay (HOSPITAL_COMMUNITY): Payer: Medicare Other

## 2016-10-04 DIAGNOSIS — Z01810 Encounter for preprocedural cardiovascular examination: Secondary | ICD-10-CM

## 2016-10-04 DIAGNOSIS — I2102 ST elevation (STEMI) myocardial infarction involving left anterior descending coronary artery: Secondary | ICD-10-CM

## 2016-10-04 LAB — BASIC METABOLIC PANEL
ANION GAP: 11 (ref 5–15)
BUN: 16 mg/dL (ref 6–20)
CO2: 23 mmol/L (ref 22–32)
Calcium: 9.2 mg/dL (ref 8.9–10.3)
Chloride: 106 mmol/L (ref 101–111)
Creatinine, Ser: 1.03 mg/dL — ABNORMAL HIGH (ref 0.44–1.00)
GFR, EST NON AFRICAN AMERICAN: 53 mL/min — AB (ref >60)
GLUCOSE: 121 mg/dL — AB (ref 65–99)
POTASSIUM: 3.7 mmol/L (ref 3.5–5.1)
SODIUM: 140 mmol/L (ref 135–145)

## 2016-10-04 LAB — CBC
HCT: 35.9 % — ABNORMAL LOW (ref 36.0–46.0)
Hemoglobin: 11.7 g/dL — ABNORMAL LOW (ref 12.0–15.0)
MCH: 28.8 pg (ref 26.0–34.0)
MCHC: 32.6 g/dL (ref 30.0–36.0)
MCV: 88.4 fL (ref 78.0–100.0)
PLATELETS: 177 10*3/uL (ref 150–400)
RBC: 4.06 MIL/uL (ref 3.87–5.11)
RDW: 12.9 % (ref 11.5–15.5)
WBC: 7.9 10*3/uL (ref 4.0–10.5)

## 2016-10-04 LAB — VAS US DOPPLER PRE CABG
LCCADDIAS: -22 cm/s
LCCAPDIAS: 17 cm/s
LCCAPSYS: 81 cm/s
LEFT ECA DIAS: -13 cm/s
LEFT VERTEBRAL DIAS: 15 cm/s
LICADDIAS: -26 cm/s
LICADSYS: -75 cm/s
LICAPSYS: -88 cm/s
Left CCA dist sys: -72 cm/s
Left ICA prox dias: -25 cm/s
RCCADSYS: -49 cm/s
RCCAPSYS: 75 cm/s
RIGHT ECA DIAS: -9 cm/s
RIGHT VERTEBRAL DIAS: 8 cm/s
Right CCA prox dias: 14 cm/s

## 2016-10-04 LAB — GLUCOSE, CAPILLARY
GLUCOSE-CAPILLARY: 115 mg/dL — AB (ref 65–99)
GLUCOSE-CAPILLARY: 90 mg/dL (ref 65–99)
Glucose-Capillary: 128 mg/dL — ABNORMAL HIGH (ref 65–99)
Glucose-Capillary: 97 mg/dL (ref 65–99)

## 2016-10-04 LAB — SPIROMETRY WITH GRAPH
FEF 25-75 PRE: 1.15 L/s
FEF2575-%Pred-Pre: 58 %
FEV1-%PRED-PRE: 68 %
FEV1-Pre: 1.71 L
FEV1FVC-%Pred-Pre: 96 %
FEV6-%Pred-Pre: 71 %
FEV6-PRE: 2.25 L
FEV6FVC-%Pred-Pre: 99 %
FVC-%Pred-Pre: 71 %
FVC-Pre: 2.36 L
PRE FEV1/FVC RATIO: 72 %
Pre FEV6/FVC Ratio: 95 %

## 2016-10-04 LAB — HEPARIN LEVEL (UNFRACTIONATED): HEPARIN UNFRACTIONATED: 0.52 [IU]/mL (ref 0.30–0.70)

## 2016-10-04 LAB — SURGICAL PCR SCREEN
MRSA, PCR: NEGATIVE
STAPHYLOCOCCUS AUREUS: POSITIVE — AB

## 2016-10-04 MED ORDER — METOPROLOL TARTRATE 12.5 MG HALF TABLET
12.5000 mg | ORAL_TABLET | Freq: Two times a day (BID) | ORAL | Status: DC
  Administered 2016-10-04 – 2016-10-05 (×4): 12.5 mg via ORAL
  Filled 2016-10-04 (×4): qty 1

## 2016-10-04 MED ORDER — MAGNESIUM HYDROXIDE 400 MG/5ML PO SUSP
30.0000 mL | Freq: Every day | ORAL | Status: DC | PRN
  Administered 2016-10-04: 30 mL via ORAL
  Filled 2016-10-04: qty 30

## 2016-10-04 MED ORDER — NITROGLYCERIN 0.4 MG SL SUBL
SUBLINGUAL_TABLET | SUBLINGUAL | Status: AC
  Filled 2016-10-04: qty 1

## 2016-10-04 MED ORDER — ALBUTEROL SULFATE (2.5 MG/3ML) 0.083% IN NEBU
2.5000 mg | INHALATION_SOLUTION | Freq: Once | RESPIRATORY_TRACT | Status: AC
  Administered 2016-10-04: 2.5 mg via RESPIRATORY_TRACT

## 2016-10-04 MED ORDER — NITROGLYCERIN 0.4 MG SL SUBL
0.4000 mg | SUBLINGUAL_TABLET | SUBLINGUAL | Status: DC | PRN
  Administered 2016-10-04 (×3): 0.4 mg via SUBLINGUAL
  Filled 2016-10-04: qty 1

## 2016-10-04 MED ORDER — ALUM & MAG HYDROXIDE-SIMETH 200-200-20 MG/5ML PO SUSP
15.0000 mL | Freq: Four times a day (QID) | ORAL | Status: DC | PRN
  Administered 2016-10-04: 30 mL via ORAL
  Filled 2016-10-04: qty 30

## 2016-10-04 MED ORDER — NITROGLYCERIN IN D5W 200-5 MCG/ML-% IV SOLN
0.0000 ug/min | INTRAVENOUS | Status: DC
  Administered 2016-10-04: 5 ug/min via INTRAVENOUS
  Filled 2016-10-04: qty 250

## 2016-10-04 MED ORDER — LISINOPRIL 20 MG PO TABS
20.0000 mg | ORAL_TABLET | Freq: Every day | ORAL | Status: DC
Start: 2016-10-04 — End: 2016-10-06
  Administered 2016-10-04 – 2016-10-05 (×2): 20 mg via ORAL
  Filled 2016-10-04 (×2): qty 1

## 2016-10-04 NOTE — Progress Notes (Signed)
Pre-op Cardiac Surgery  Carotid Findings:  Findings suggest 1-39% internal carotid artery stenosis bilaterally. Vertebral arteries are patent with antegrade flow.  Upper Extremity Right Left  Brachial Pressures 108-Triphasic 113-Triphasic  Radial Waveforms Triphasic Triphasic  Ulnar Waveforms Triphasic Triphasic  Palmar Arch (Allen's Test) Signal decreases >50% with radial compression, is unaffected with ulnar compression. Within normal limits.    Lower  Extremity Right Left  Dorsalis Pedis 114-Triphasic 125-Triphasic  Anterior Tibial    Posterior Tibial 128-Triphasic 116-Triphasic  Ankle/Brachial Indices 1.13 1.11    Findings:   Bilateral ABIs are within normal limits at rest.  10/04/2016 12:08 PM [name], BS, RVT, RDCS, RDMS

## 2016-10-04 NOTE — Progress Notes (Signed)
Patient c/o 8/10 left lateral neck "discomfort."  Patient assisted back to bed, 2L O2 applied, EKG obtained, and 1 SL nitro given x3 every 5 minutes.  Neck pain now 3/10.  Cardiology MD & NP paged. [place], Ardeth Sportsman

## 2016-10-04 NOTE — Progress Notes (Signed)
ANTICOAGULATION CONSULT NOTE - Follow-up Consult  Pharmacy Consult for Aggrastat>>heparin Indication: s/p STEMI, afib> planned CABG  Allergies  Allergen Reactions  . Ergocalciferol Nausea And Vomiting  . Niacin And Related Other (See Comments)    Burning feeling to face   Patient Measurements: Height: '5\' 7"'$  (170.2 cm) Weight: 129 lb 10.1 oz (58.8 kg) IBW/kg (Calculated) : 61.6 Heparin Dosing Weight:  58.8 kg  Vital Signs: Temp: 97.8 F (36.6 C) (04/11 0401) Temp Source: Oral (04/11 0401) BP: 131/69 (04/11 0900) Pulse Rate: 62 (04/11 0930)  Labs:  Recent Labs  10/01/16 1645 10/01/16 2201 10/02/16 0451 10/02/16 1744 10/03/16 0526 10/04/16 0306  HGB  --   --  11.1*  --  11.4* 11.7*  HCT  --   --  34.1*  --  34.8* 35.9*  PLT  --   --  177  --  177 177  HEPARINUNFRC  --   --   --  0.27* 0.44 0.52  CREATININE  --   --  0.87  --  1.01* 1.03*  TROPONINI 0.31* 2.10* 2.11*  --   --   --    Estimated Creatinine Clearance: 45.8 mL/min (A) (by C-G formula based on SCr of 1.03 mg/dL (H)).  Medical History: Past Medical History:  Diagnosis Date  . Arthritis   . Asthma   . Chest pain   . Diabetes mellitus   . Hypertension   . MI (myocardial infarction) 05/2002  . SOB (shortness of breath)    Assessment: CC/HPI: Transferred from [facility] for code STEMI. s/p cath plan CABG this week, also new Afib> now SR.   Aggrastat post-cath stopped 4/9 and transitioned to heparin drip 900 uts/hr HL 0.55 at goal, CBC slightly lower than admit still ok - will watch No infusion or bleeding reported;  Cp today restart NTG drip  PMH: CAD, stents,DM, HTN, HLD, arthritis, asthma, tobacco    Goal of Therapy:  Heparin level 0.3-0.7 units/ml Monitor platelets by anticoagulation protocol: Yes   Plan:  Continue  heparin gtt to 900 units/hr  Daily CBC/ heparin level   Bonnita Nasuti Pharm.D. CPP, BCPS Clinical Pharmacist [phone] 10/04/2016 9:38 AM    10/04/2016 9:38 AM

## 2016-10-04 NOTE — Plan of Care (Signed)
Problem: Bowel/Gastric: Goal: Will not experience complications related to bowel motility Outcome: Not Progressing Patient given PRN MOM

## 2016-10-04 NOTE — Progress Notes (Signed)
Noted pt with angina this am, now on NTG. Will hold ambulation.  Yves Dill CES, ACSM 2:37 PM 10/04/2016

## 2016-10-04 NOTE — Progress Notes (Signed)
      [address]       [place] [address]             [phone]      Up in chair. c/o chest tightness  BP 139/73   Pulse (!) 52   Temp 97.8 F (36.6 C) (Oral)   Resp 13   Ht '5\' 7"'$  (1.702 m)   Wt 129 lb 10.1 oz (58.8 kg)   SpO2 96%   BMI 20.30 kg/m   [name] into 40s last night- amiodarone stopped  Asked RN to give NTG for chest tightness  For CABG Friday  Mercedies Ganesh C. Roxan Hockey, MD [facility] [phone]

## 2016-10-04 NOTE — Progress Notes (Signed)
Progress Note  Patient Name: [name] Date of Encounter: 10/04/2016  Primary Cardiologist: Fletcher Anon   Subjective   Postop day #2 RCA intervention in setting of inferior STEMI with multiple stents placed in the past. A new stent was not placed in the mid RCA because of 2 layers of stent already existing there. She sets stenting of her circumflex and LAD as well. She remains on IV heparin pain free. She had a low-grade troponin leak and has preserved LV function. She Has had recurrent chest pain and we have decided to initiate IV nitroglycerin.. She is scheduled for coronary artery bypass grafting by Dr. Roxan Hockey on Friday morning.  Inpatient Medications    Scheduled Meds: . amiodarone  150 mg Intravenous Once  . aspirin EC  81 mg Oral Daily  . atorvastatin  80 mg Oral q1800  . insulin aspart  0-9 Units Subcutaneous TID WC  . lisinopril  40 mg Oral Daily  . mouth rinse  15 mL Mouth Rinse BID  . metoprolol  25 mg Oral BID  . nitroGLYCERIN      . sodium chloride flush  3 mL Intravenous Q12H   Continuous Infusions: . amiodarone Stopped (10/04/16 0400)  . heparin 900 Units/hr (10/04/16 0700)  . nitroGLYCERIN 5 mcg/min (10/04/16 0921)   PRN Meds: sodium chloride, acetaminophen, alum & mag hydroxide-simeth, magnesium hydroxide, nitroGLYCERIN, ondansetron (ZOFRAN) IV, oxyCODONE-acetaminophen, promethazine, sodium chloride flush   Vital Signs    Vitals:   10/04/16 0700 10/04/16 0800 10/04/16 0846 10/04/16 0900  BP: 135/73 139/73 132/68   Pulse: (!) 50 (!) 52 60 65  Resp: '16 13 20 19  '$ Temp:      TempSrc:      SpO2: 100% 96% 98% 98%  Weight:      Height:        Intake/Output Summary (Last 24 hours) at 10/04/16 0932 Last data filed at 10/04/16 0900  Gross per 24 hour  Intake            773.3 ml  Output             4375 ml  Net          -3601.7 ml   Filed Weights   10/01/16 1600  Weight: 129 lb 10.1 oz (58.8 kg)    Telemetry    Sinus rhythm - Personally  Reviewed  ECG    Normal sinus rhythm at 60 with inferior T-wave inversion- Personally Reviewed  Physical Exam   GEN: No acute distress.   Neck: No JVD Cardiac: RRR, no murmurs, rubs, or gallops.  Respiratory: Clear to auscultation bilaterally. GI: Soft, nontender, non-distended  MS: No edema; No deformity. Neuro:  Nonfocal  Psych: Normal affect   Labs    Chemistry  Recent Labs Lab 10/02/16 0451 10/03/16 0526 10/04/16 0306  NA 139 141 140  K 3.9 4.0 3.7  CL 108 105 106  CO2 '24 26 23  '$ GLUCOSE 114* 104* 121*  BUN '16 13 16  '$ CREATININE 0.87 1.01* 1.03*  CALCIUM 8.6* 9.1 9.2  GFRNONAA >60 54* 53*  GFRAA >60 >60 >60  ANIONGAP '7 10 11     '$ Hematology  Recent Labs Lab 10/02/16 0451 10/03/16 0526 10/04/16 0306  WBC 8.6 7.4 7.9  RBC 3.78* 3.90 4.06  HGB 11.1* 11.4* 11.7*  HCT 34.1* 34.8* 35.9*  MCV 90.2 89.2 88.4  MCH 29.4 29.2 28.8  MCHC 32.6 32.8 32.6  RDW 13.3 13.3 12.9  PLT 177 177 177  Cardiac Enzymes  Recent Labs Lab 10/01/16 1645 10/01/16 2201 10/02/16 0451  TROPONINI 0.31* 2.10* 2.11*   No results for input(s): TROPIPOC in the last 168 hours.   BNPNo results for input(s): BNP, PROBNP in the last 168 hours.   DDimer No results for input(s): DDIMER in the last 168 hours.   Radiology    No results found.  Cardiac Studies   Cath:   Conclusion    Acute inferior ST elevation myocardial infarction due to in-stent restenosis/late stent thrombosis within the mid segment of the right coronary which has 2 layers of previously placed stent.  Successful angioplasty of the mid right coronary segment improving flow from TIMI grade 2 to TIMI grade 3 with resolution of symptoms. Additional stenting was not performed.  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 A large diagonal is jailed.  Severe in-stent restenosis in the circumflex with 90% obstruction and the large first obtuse marginal.  Total occlusion of the smaller second obtuse marginal.  Severe inferior wall hypokinesis. Ejection fraction 50-55%. Normal LVEDP.  RECOMMENDATIONS:   TCTS consultation to consider multivessel bypass grafting.  Hold Plavix  Aggrastat for 18 hours  IV amiodarone to convert atrial fibrillation  IV heparin for atrial fibrillation after Aggrastat discontinued.     Patient Profile     71 y.o. female history of previous stents in her RCA multiple times, LAD and circumflex inferior STEMI on Sunday, 10/01/16, treated with RCA POBA by Dr. Tamala Julian radially. She has preserved LV function. She does have an occluded circumflex beyond the stented first obtuse marginal branch and high-grade segmental proximal mid LAD "in-stent restenosis. Dr. Tamala Julian restored TIMI-3 flow to her RCA. She's been pain-free. Her troponin leak was low. She remains on IV heparin. Plavix was discontinued. She scheduled for coronary artery bypass grafting on Friday.  Assessment & Plan    1: Inferior STEMI-postop day 2 inferior STEMI treated with angioplasty of an occluded mid dominant RCA secondary to "in-stent restenosis with overlying thrombus. Dr. Tamala Julian restored TIMI-3 flow. Her pain resolved. Her troponin leak was minimal at 2. She has preserved LV function. She has diffuse disease in the LAD and circumflex as well. Plan coronary artery bypass grafting on Friday morning by Dr. Roxan Hockey after Plavix washout. Her by mouth was 245 suggesting that her Plavix as adequately washed out. She's had recurrent chest pain and we are starting her on an intravenous nitroglycerin drip.  2: Tobacco abuse-long history of tobacco abuse probably recalcitrant to risk factor modification  3: Hyperlipidemia-on statin therapy  4: Atrial fibrillation-converted to sinus rhythm on IV amiodarone which has been discontinued because of bradycardia. She is maintaining sinus rhythm.   [name], MD  10/04/2016, 9:32 AM

## 2016-10-04 NOTE — Progress Notes (Signed)
Pt HR 46-low 50s pt complaining of generalized not feeling like her normal self, placed pt on [facility] with no relief of symptoms. Cardiology MD on call Dr. Radford Pax notified of pts HR and complaints, received orders to turn IV amio off. RN turned IV amio off. Will continue to monitor pt.

## 2016-10-05 ENCOUNTER — Encounter (HOSPITAL_COMMUNITY): Payer: Self-pay

## 2016-10-05 ENCOUNTER — Inpatient Hospital Stay (HOSPITAL_COMMUNITY): Payer: Medicare Other

## 2016-10-05 DIAGNOSIS — E7800 Pure hypercholesterolemia, unspecified: Secondary | ICD-10-CM

## 2016-10-05 LAB — CBC
HCT: 37 % (ref 36.0–46.0)
HEMOGLOBIN: 12.4 g/dL (ref 12.0–15.0)
MCH: 29.9 pg (ref 26.0–34.0)
MCHC: 33.5 g/dL (ref 30.0–36.0)
MCV: 89.2 fL (ref 78.0–100.0)
Platelets: 189 10*3/uL (ref 150–400)
RBC: 4.15 MIL/uL (ref 3.87–5.11)
RDW: 13.2 % (ref 11.5–15.5)
WBC: 7.4 10*3/uL (ref 4.0–10.5)

## 2016-10-05 LAB — GLUCOSE, CAPILLARY
GLUCOSE-CAPILLARY: 167 mg/dL — AB (ref 65–99)
GLUCOSE-CAPILLARY: 114 mg/dL — AB (ref 65–99)
GLUCOSE-CAPILLARY: 175 mg/dL — AB (ref 65–99)
Glucose-Capillary: 116 mg/dL — ABNORMAL HIGH (ref 65–99)

## 2016-10-05 LAB — URINALYSIS, ROUTINE W REFLEX MICROSCOPIC
BILIRUBIN URINE: NEGATIVE
Glucose, UA: 50 mg/dL — AB
Hgb urine dipstick: NEGATIVE
KETONES UR: NEGATIVE mg/dL
Leukocytes, UA: NEGATIVE
Nitrite: NEGATIVE
PH: 7 (ref 5.0–8.0)
Protein, ur: NEGATIVE mg/dL
SPECIFIC GRAVITY, URINE: 1.005 (ref 1.005–1.030)

## 2016-10-05 LAB — BASIC METABOLIC PANEL
Anion gap: 8 (ref 5–15)
BUN: 16 mg/dL (ref 6–20)
CHLORIDE: 105 mmol/L (ref 101–111)
CO2: 28 mmol/L (ref 22–32)
Calcium: 9.6 mg/dL (ref 8.9–10.3)
Creatinine, Ser: 1.22 mg/dL — ABNORMAL HIGH (ref 0.44–1.00)
GFR calc Af Amer: 50 mL/min — ABNORMAL LOW (ref >60)
GFR calc non Af Amer: 43 mL/min — ABNORMAL LOW (ref >60)
GLUCOSE: 115 mg/dL — AB (ref 65–99)
POTASSIUM: 4.7 mmol/L (ref 3.5–5.1)
SODIUM: 141 mmol/L (ref 135–145)

## 2016-10-05 LAB — HEPARIN LEVEL (UNFRACTIONATED): HEPARIN UNFRACTIONATED: 0.39 [IU]/mL (ref 0.30–0.70)

## 2016-10-05 LAB — ABO/RH: ABO/RH(D): O POS

## 2016-10-05 MED ORDER — INSULIN REGULAR HUMAN 100 UNIT/ML IJ SOLN
INTRAMUSCULAR | Status: AC
  Administered 2016-10-06: 1 [IU]/h via INTRAVENOUS
  Filled 2016-10-05: qty 2.5

## 2016-10-05 MED ORDER — CHLORHEXIDINE GLUCONATE 0.12 % MT SOLN: 15.0000 mL | Freq: Once | OROMUCOSAL | Status: DC

## 2016-10-05 MED ORDER — TRANEXAMIC ACID (OHS) BOLUS VIA INFUSION
15.0000 mg/kg | INTRAVENOUS | Status: AC
  Administered 2016-10-06: 882 mg via INTRAVENOUS
  Filled 2016-10-05: qty 882

## 2016-10-05 MED ORDER — SODIUM CHLORIDE 0.9 % IV SOLN
30.0000 ug/min | INTRAVENOUS | Status: AC
  Administered 2016-10-06: 10 ug/min via INTRAVENOUS
  Filled 2016-10-05: qty 2

## 2016-10-05 MED ORDER — DOPAMINE-DEXTROSE 3.2-5 MG/ML-% IV SOLN
0.0000 ug/kg/min | INTRAVENOUS | Status: DC
  Filled 2016-10-05: qty 250

## 2016-10-05 MED ORDER — SODIUM CHLORIDE 0.9 % IV SOLN
INTRAVENOUS | Status: DC
  Filled 2016-10-05: qty 30

## 2016-10-05 MED ORDER — POTASSIUM CHLORIDE 2 MEQ/ML IV SOLN
80.0000 meq | INTRAVENOUS | Status: DC
  Filled 2016-10-05: qty 40

## 2016-10-05 MED ORDER — MAGNESIUM SULFATE 50 % IJ SOLN
40.0000 meq | INTRAMUSCULAR | Status: DC
  Filled 2016-10-05: qty 10

## 2016-10-05 MED ORDER — TEMAZEPAM 15 MG PO CAPS
15.0000 mg | ORAL_CAPSULE | Freq: Once | ORAL | Status: AC | PRN
  Administered 2016-10-05: 15 mg via ORAL
  Filled 2016-10-05: qty 1

## 2016-10-05 MED ORDER — VANCOMYCIN HCL 10 G IV SOLR
1250.0000 mg | INTRAVENOUS | Status: AC
  Administered 2016-10-06: 1250 mg via INTRAVENOUS
  Filled 2016-10-05: qty 1250

## 2016-10-05 MED ORDER — TRANEXAMIC ACID (OHS) PUMP PRIME SOLUTION
2.0000 mg/kg | INTRAVENOUS | Status: DC
  Filled 2016-10-05: qty 1.18

## 2016-10-05 MED ORDER — CHLORHEXIDINE GLUCONATE CLOTH 2 % EX PADS
6.0000 | MEDICATED_PAD | Freq: Every day | CUTANEOUS | Status: AC
  Administered 2016-10-05 – 2016-10-09 (×4): 6 via TOPICAL

## 2016-10-05 MED ORDER — PAPAVERINE HCL 30 MG/ML IJ SOLN
INTRAMUSCULAR | Status: AC
  Administered 2016-10-06: 500 mL
  Filled 2016-10-05: qty 2.5

## 2016-10-05 MED ORDER — NITROGLYCERIN IN D5W 200-5 MCG/ML-% IV SOLN
2.0000 ug/min | INTRAVENOUS | Status: DC
  Filled 2016-10-05: qty 250

## 2016-10-05 MED ORDER — MUPIROCIN 2 % EX OINT
1.0000 "application " | TOPICAL_OINTMENT | Freq: Two times a day (BID) | CUTANEOUS | Status: AC
  Administered 2016-10-05 – 2016-10-09 (×9): 1 via NASAL
  Filled 2016-10-05 (×3): qty 22

## 2016-10-05 MED ORDER — DEXTROSE 5 % IV SOLN
1.5000 g | INTRAVENOUS | Status: AC
  Administered 2016-10-06: 1.5 g via INTRAVENOUS
  Administered 2016-10-06: .75 g via INTRAVENOUS
  Filled 2016-10-05: qty 1.5

## 2016-10-05 MED ORDER — BISACODYL 5 MG PO TBEC
5.0000 mg | DELAYED_RELEASE_TABLET | Freq: Once | ORAL | Status: AC
  Administered 2016-10-05: 5 mg via ORAL
  Filled 2016-10-05: qty 1

## 2016-10-05 MED ORDER — METOPROLOL TARTRATE 12.5 MG HALF TABLET: 12.5000 mg | ORAL_TABLET | Freq: Once | ORAL | Status: DC

## 2016-10-05 MED ORDER — DEXTROSE 5 % IV SOLN
750.0000 mg | INTRAVENOUS | Status: DC
  Filled 2016-10-05 (×2): qty 750

## 2016-10-05 MED ORDER — DEXMEDETOMIDINE HCL IN NACL 400 MCG/100ML IV SOLN
0.1000 ug/kg/h | INTRAVENOUS | Status: AC
  Administered 2016-10-06: .3 ug/kg/h via INTRAVENOUS
  Filled 2016-10-05: qty 100

## 2016-10-05 MED ORDER — TRANEXAMIC ACID 1000 MG/10ML IV SOLN
1.5000 mg/kg/h | INTRAVENOUS | Status: AC
  Administered 2016-10-06: 1.5 mg/kg/h via INTRAVENOUS
  Filled 2016-10-05: qty 25

## 2016-10-05 MED ORDER — DEXTROSE 5 % IV SOLN
0.0000 ug/min | INTRAVENOUS | Status: DC
  Filled 2016-10-05: qty 4

## 2016-10-05 NOTE — Progress Notes (Signed)
Progress Note  Patient Name: [name] Date of Encounter: 10/05/2016  Primary Cardiologist: Dr. Fletcher Anon Postop day # 3  Subjective   Episode of chest pain yesterday that subsided after IV nitro. CABG tomorrow.   Inpatient Medications    Scheduled Meds: . aspirin EC  81 mg Oral Daily  . atorvastatin  80 mg Oral q1800  . Chlorhexidine Gluconate Cloth  6 each Topical Daily  . insulin aspart  0-9 Units Subcutaneous TID WC  . lisinopril  20 mg Oral Daily  . mouth rinse  15 mL Mouth Rinse BID  . metoprolol  12.5 mg Oral BID  . mupirocin ointment  1 application Nasal BID  . sodium chloride flush  3 mL Intravenous Q12H   Continuous Infusions: . heparin 900 Units/hr (10/04/16 1900)  . nitroGLYCERIN 5 mcg/min (10/04/16 1900)   PRN Meds: sodium chloride, acetaminophen, alum & mag hydroxide-simeth, magnesium hydroxide, nitroGLYCERIN, ondansetron (ZOFRAN) IV, oxyCODONE-acetaminophen, promethazine, sodium chloride flush   Vital Signs    Vitals:   10/05/16 0400 10/05/16 0500 10/05/16 0648 10/05/16 0700  BP: 129/61  (!) 129/95 131/64  Pulse: (!) 53 (!) 49 64 (!) 51  Resp: '16 16 12 16  '$ Temp:    98.5 F (36.9 C)  TempSrc:    Oral  SpO2: 94% 92% 97% 98%  Weight:      Height:        Intake/Output Summary (Last 24 hours) at 10/05/16 0748 Last data filed at 10/05/16 0700  Gross per 24 hour  Intake          2111.48 ml  Output              700 ml  Net          1411.48 ml   Filed Weights   10/01/16 1600  Weight: 129 lb 10.1 oz (58.8 kg)    Telemetry    - Sinus bradycardia at rate of 50s -  Personally Reviewed  ECG    None today - Personally Reviewed  Physical Exam   GEN: No acute distress.   Neck: No JVD Cardiac: RRR, no murmurs, rubs, or gallops.  Respiratory: Clear to auscultation bilaterally. GI: Soft, nontender, non-distended  MS: No edema; No deformity. Neuro:  Nonfocal  Psych: Normal affect   Labs    Chemistry Recent Labs Lab 10/03/16 0526  10/04/16 0306 10/05/16 0326  NA 141 140 141  K 4.0 3.7 4.7  CL 105 106 105  CO2 '26 23 28  '$ GLUCOSE 104* 121* 115*  BUN '13 16 16  '$ CREATININE 1.01* 1.03* 1.22*  CALCIUM 9.1 9.2 9.6  GFRNONAA 54* 53* 43*  GFRAA >60 >60 50*  ANIONGAP '10 11 8     '$ Hematology Recent Labs Lab 10/03/16 0526 10/04/16 0306 10/05/16 0326  WBC 7.4 7.9 7.4  RBC 3.90 4.06 4.15  HGB 11.4* 11.7* 12.4  HCT 34.8* 35.9* 37.0  MCV 89.2 88.4 89.2  MCH 29.2 28.8 29.9  MCHC 32.8 32.6 33.5  RDW 13.3 12.9 13.2  PLT 177 177 189    Cardiac Enzymes Recent Labs Lab 10/01/16 1645 10/01/16 2201 10/02/16 0451  TROPONINI 0.31* 2.10* 2.11*   No results for input(s): TROPIPOC in the last 168 hours.   BNPNo results for input(s): BNP, PROBNP in the last 168 hours.   DDimer No results for input(s): DDIMER in the last 168 hours.   Radiology    No results found.  Cardiac Studies   Coronary Balloon Angioplasty  Left Heart Cath and Coronary Angiography     Acute inferior ST elevation myocardial infarction due to in-stent restenosis/late stent thrombosis within the mid segment of the right coronary which has 2 layers of previously placed stent.  Successful angioplasty of the mid right coronary segment improving flow from TIMI grade 2 to TIMI grade 3 with resolution of symptoms. Additional stenting was not performed.  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 A large diagonal is jailed.  Severe in-stent restenosis in the circumflex with 90% obstruction and the large first obtuse marginal. Total occlusion of the smaller second obtuse marginal.  Severe inferior wall hypokinesis. Ejection fraction 50-55%. Normal LVEDP.  RECOMMENDATIONS:   TCTS consultation to consider multivessel bypass grafting.  Hold Plavix  Aggrastat for 18 hours  IV amiodarone to convert atrial fibrillation  IV heparin for atrial fibrillation after Aggrastat  discontinued.      Patient Profile  71 year old female with extensive prior history of coronary disease that includes prior stenting of all 3 major coronary vessels (RCA 2 overlapping; LAD; and circumflex), diabetic with history of hypertension and hyperlipidemia presented with inferior STEMI 10/01/16.   Assessment & Plan    1: Inferior STEMI-postop day 3 inferior STEMI treated with angioplasty of an occluded mid dominant RCA secondary to "in-stent restenosis with overlying thrombus. Dr. Tamala Julian restored TIMI-3 flow.  Her troponin leak was minimal at 2. She has preserved LV function. She has diffuse disease in the LAD and circumflex as well. P - Plan for CABG tomorrow after Plavix washout. Recurrent chest pain resolved with IV nitro. Continue IV heparin, ASA and statin.  2: Tobacco abuse-long history of tobacco abuse probably recalcitrant to risk factor modification  3: Hyperlipidemia-on statin therapy. Check lipid panel.   4: Atrial fibrillation-converted to sinus rhythm on IV amiodarone which has been discontinued because of bradycardia. She is maintaining sinus rhythm.   Signed, Leanor Kail, PA  10/05/2016, 7:48 AM    Agree with note by Robbie Lis PA-C  Clinically stable status post inferior STEMI treated with angioplasty of an occluded mid RCA stent with residual severe disease. She remains on IV heparin and nitroglycerin. The nitroglycerin actually improved clinically. She currently is asymptomatic awaiting bypass surgery tomorrow. She is maintaining sinus rhythm off amiodarone.  Lorretta Harp, M.D., [place], [facility], [place] [address] [place], [place]  [address]  [phone] 10/05/2016 9:06 AM

## 2016-10-05 NOTE — Progress Notes (Signed)
      [address]       [place] [address]             [phone]      No complaints today. No further CP  BP 122/61   Pulse (!) 58   Temp 98.8 F (37.1 C) (Oral)   Resp 16   Ht '5\' 7"'$  (1.702 m)   Wt 129 lb 10.1 oz (58.8 kg)   SpO2 98%   BMI 20.30 kg/m    Intake/Output Summary (Last 24 hours) at 10/05/16 1316 Last data filed at 10/05/16 1200  Gross per 24 hour  Intake           2044.5 ml  Output              700 ml  Net           1344.5 ml    Exam unchanged  PFT and carotid duplex Ok  For CABG in AM All questions answered  Remo Lipps C. Roxan Hockey, MD [facility] [phone]

## 2016-10-05 NOTE — Progress Notes (Signed)
ANTICOAGULATION CONSULT NOTE - Follow-up Consult  Pharmacy Consult for Aggrastat>>heparin Indication: s/p STEMI, afib> planned CABG  Allergies  Allergen Reactions  . Ergocalciferol Nausea And Vomiting  . Niacin And Related Other (See Comments)    Burning feeling to face   Patient Measurements: Height: '5\' 7"'$  (170.2 cm) Weight: 129 lb 10.1 oz (58.8 kg) IBW/kg (Calculated) : 61.6 Heparin Dosing Weight:  58.8 kg  Vital Signs: Temp: 98.5 F (36.9 C) (04/12 0700) Temp Source: Oral (04/12 0700) BP: 140/66 (04/12 0900) Pulse Rate: 58 (04/12 0900)  Labs:  Recent Labs  10/03/16 0526 10/04/16 0306 10/05/16 0326  HGB 11.4* 11.7* 12.4  HCT 34.8* 35.9* 37.0  PLT 177 177 189  HEPARINUNFRC 0.44 0.52 0.39  CREATININE 1.01* 1.03* 1.22*   Estimated Creatinine Clearance: 38.7 mL/min (A) (by C-G formula based on SCr of 1.22 mg/dL (H)).  Medical History: Past Medical History:  Diagnosis Date  . Arthritis   . Asthma   . Chest pain   . Diabetes mellitus   . Hypertension   . MI (myocardial infarction) 05/2002  . SOB (shortness of breath)    Assessment: CC/HPI: Transferred from [facility] for code STEMI. s/p cath plan CABG on 4/13, also new Afib> now SR.   Aggrastat post-cath stopped 4/9 and transitioned to heparin drip 900 uts/hr; heparin level remains therapeutic 0.39, CBC stable; no infusion issues or bleeding reported;  NTG drip restarted for chest pain  PMH: CAD, stents,DM, HTN, HLD, arthritis, asthma, tobacco   Goal of Therapy:  Heparin level 0.3-0.7 units/ml Monitor platelets by anticoagulation protocol: Yes   Plan:  Continue  heparin gtt to 900 units/hr  Daily CBC/ heparin level  Georga Bora, PharmD Clinical Pharmacist Pager: [phone] 10/05/2016 9:38 AM

## 2016-10-05 NOTE — Care Management Note (Signed)
Case Management Note Original Note Created by Carles Collet  Patient Details  Name: [name] MRN: [id number] Date of Birth: 06-10-1946  Subjective/Objective:                 Patient admitted from home with MI, emergent cath. Continues Hep gtt at this time. Medication coverage through Medicaid.    Action/Plan:  CM will continue to follow for DC needs.   Expected Discharge Date:                  Expected Discharge Plan:     In-House Referral:     Discharge planning Services  CM Consult  Post Acute Care Choice:    Choice offered to:     DME Arranged:    DME Agency:     HH Arranged:    HH Agency:     Status of Service:  In process, will continue to follow  If discussed at Long Length of Stay Meetings, dates discussed:    Additional Comments:  10/05/2016 CABG scheduled for tomorrow 10/06/16 Raven Labrador, RN 10/05/2016, 2:25 PM

## 2016-10-06 ENCOUNTER — Inpatient Hospital Stay (HOSPITAL_COMMUNITY)
Admission: EM | Disposition: A | Discharge status: Home Health | Payer: Self-pay | Source: Home / Self Care | Attending: Thoracic Surgery (Cardiothoracic Vascular Surgery)

## 2016-10-06 ENCOUNTER — Inpatient Hospital Stay (HOSPITAL_COMMUNITY): Payer: Medicare Other

## 2016-10-06 ENCOUNTER — Inpatient Hospital Stay (HOSPITAL_COMMUNITY): Payer: Medicare Other | Admitting: Anesthesiology

## 2016-10-06 ENCOUNTER — Encounter (HOSPITAL_COMMUNITY): Payer: Self-pay | Admitting: Certified Registered Nurse Anesthetist

## 2016-10-06 DIAGNOSIS — I25110 Atherosclerotic heart disease of native coronary artery with unstable angina pectoris: Secondary | ICD-10-CM

## 2016-10-06 DIAGNOSIS — I2510 Atherosclerotic heart disease of native coronary artery without angina pectoris: Secondary | ICD-10-CM | POA: Diagnosis present

## 2016-10-06 DIAGNOSIS — I214 Non-ST elevation (NSTEMI) myocardial infarction: Secondary | ICD-10-CM

## 2016-10-06 HISTORY — PX: TEE WITHOUT CARDIOVERSION: SHX5443

## 2016-10-06 HISTORY — PX: CORONARY ARTERY BYPASS GRAFT: SHX141

## 2016-10-06 LAB — CBC
HCT: 20.4 % — ABNORMAL LOW (ref 36.0–46.0)
HCT: 29.5 % — ABNORMAL LOW (ref 36.0–46.0)
HEMATOCRIT: 38.6 % (ref 36.0–46.0)
HEMOGLOBIN: 12.9 g/dL (ref 12.0–15.0)
Hemoglobin: 6.8 g/dL — CL (ref 12.0–15.0)
Hemoglobin: 9.7 g/dL — ABNORMAL LOW (ref 12.0–15.0)
MCH: 29.4 pg (ref 26.0–34.0)
MCH: 29.4 pg (ref 26.0–34.0)
MCH: 28.2 pg (ref 26.0–34.0)
MCHC: 33.4 g/dL (ref 30.0–36.0)
MCHC: 33.3 g/dL (ref 30.0–36.0)
MCHC: 32.9 g/dL (ref 30.0–36.0)
MCV: 87.9 fL (ref 78.0–100.0)
MCV: 88.3 fL (ref 78.0–100.0)
MCV: 85.8 fL (ref 78.0–100.0)
Platelets: 201 10*3/uL (ref 150–400)
Platelets: 81 10*3/uL — ABNORMAL LOW (ref 150–400)
Platelets: 98 10*3/uL — ABNORMAL LOW (ref 150–400)
RBC: 4.39 MIL/uL (ref 3.87–5.11)
RBC: 2.31 MIL/uL — ABNORMAL LOW (ref 3.87–5.11)
RBC: 3.44 MIL/uL — ABNORMAL LOW (ref 3.87–5.11)
RDW: 12.9 % (ref 11.5–15.5)
RDW: 13 % (ref 11.5–15.5)
RDW: 14.3 % (ref 11.5–15.5)
WBC: 8.2 10*3/uL (ref 4.0–10.5)
WBC: 6.1 10*3/uL (ref 4.0–10.5)
WBC: 8.3 10*3/uL (ref 4.0–10.5)

## 2016-10-06 LAB — POCT I-STAT, CHEM 8
BUN: 17 mg/dL (ref 6–20)
BUN: 14 mg/dL (ref 6–20)
BUN: 18 mg/dL (ref 6–20)
BUN: 14 mg/dL (ref 6–20)
BUN: 14 mg/dL (ref 6–20)
BUN: 13 mg/dL (ref 6–20)
BUN: 13 mg/dL (ref 6–20)
CALCIUM ION: 1.03 mmol/L — AB (ref 1.15–1.40)
CALCIUM ION: 1.04 mmol/L — AB (ref 1.15–1.40)
CALCIUM ION: 0.98 mmol/L — AB (ref 1.15–1.40)
CHLORIDE: 105 mmol/L (ref 101–111)
CHLORIDE: 100 mmol/L — AB (ref 101–111)
CREATININE: 0.8 mg/dL (ref 0.44–1.00)
CREATININE: 0.8 mg/dL (ref 0.44–1.00)
CREATININE: 0.8 mg/dL (ref 0.44–1.00)
CREATININE: 0.8 mg/dL (ref 0.44–1.00)
Calcium, Ion: 1.28 mmol/L (ref 1.15–1.40)
Calcium, Ion: 0.97 mmol/L — ABNORMAL LOW (ref 1.15–1.40)
Calcium, Ion: 1.22 mmol/L (ref 1.15–1.40)
Calcium, Ion: 1.1 mmol/L — ABNORMAL LOW (ref 1.15–1.40)
Chloride: 106 mmol/L (ref 101–111)
Chloride: 102 mmol/L (ref 101–111)
Chloride: 104 mmol/L (ref 101–111)
Chloride: 103 mmol/L (ref 101–111)
Chloride: 104 mmol/L (ref 101–111)
Creatinine, Ser: 1.1 mg/dL — ABNORMAL HIGH (ref 0.44–1.00)
Creatinine, Ser: 1 mg/dL (ref 0.44–1.00)
Creatinine, Ser: 0.7 mg/dL (ref 0.44–1.00)
GLUCOSE: 126 mg/dL — AB (ref 65–99)
GLUCOSE: 141 mg/dL — AB (ref 65–99)
GLUCOSE: 153 mg/dL — AB (ref 65–99)
GLUCOSE: 131 mg/dL — AB (ref 65–99)
GLUCOSE: 179 mg/dL — AB (ref 65–99)
Glucose, Bld: 124 mg/dL — ABNORMAL HIGH (ref 65–99)
Glucose, Bld: 137 mg/dL — ABNORMAL HIGH (ref 65–99)
HCT: 23 % — ABNORMAL LOW (ref 36.0–46.0)
HCT: 23 % — ABNORMAL LOW (ref 36.0–46.0)
HCT: 23 % — ABNORMAL LOW (ref 36.0–46.0)
HCT: 20 % — ABNORMAL LOW (ref 36.0–46.0)
HCT: 27 % — ABNORMAL LOW (ref 36.0–46.0)
HEMATOCRIT: 34 % — AB (ref 36.0–46.0)
HEMATOCRIT: 32 % — AB (ref 36.0–46.0)
HEMOGLOBIN: 10.9 g/dL — AB (ref 12.0–15.0)
HEMOGLOBIN: 6.8 g/dL — AB (ref 12.0–15.0)
HEMOGLOBIN: 9.2 g/dL — AB (ref 12.0–15.0)
Hemoglobin: 11.6 g/dL — ABNORMAL LOW (ref 12.0–15.0)
Hemoglobin: 7.8 g/dL — ABNORMAL LOW (ref 12.0–15.0)
Hemoglobin: 7.8 g/dL — ABNORMAL LOW (ref 12.0–15.0)
Hemoglobin: 7.8 g/dL — ABNORMAL LOW (ref 12.0–15.0)
POTASSIUM: 3.9 mmol/L (ref 3.5–5.1)
POTASSIUM: 4.4 mmol/L (ref 3.5–5.1)
Potassium: 4.2 mmol/L (ref 3.5–5.1)
Potassium: 4.4 mmol/L (ref 3.5–5.1)
Potassium: 4.8 mmol/L (ref 3.5–5.1)
Potassium: 4.6 mmol/L (ref 3.5–5.1)
Potassium: 4.2 mmol/L (ref 3.5–5.1)
SODIUM: 139 mmol/L (ref 135–145)
SODIUM: 136 mmol/L (ref 135–145)
Sodium: 137 mmol/L (ref 135–145)
Sodium: 138 mmol/L (ref 135–145)
Sodium: 137 mmol/L (ref 135–145)
Sodium: 138 mmol/L (ref 135–145)
Sodium: 141 mmol/L (ref 135–145)
TCO2: 26 mmol/L (ref 0–100)
TCO2: 26 mmol/L (ref 0–100)
TCO2: 27 mmol/L (ref 0–100)
TCO2: 26 mmol/L (ref 0–100)
TCO2: 26 mmol/L (ref 0–100)
TCO2: 26 mmol/L (ref 0–100)
TCO2: 26 mmol/L (ref 0–100)

## 2016-10-06 LAB — PROTIME-INR
INR: 1.58
Prothrombin Time: 19 s — ABNORMAL HIGH (ref 11.4–15.2)

## 2016-10-06 LAB — BASIC METABOLIC PANEL
Anion gap: 8 (ref 5–15)
BUN: 17 mg/dL (ref 6–20)
CHLORIDE: 106 mmol/L (ref 101–111)
CO2: 25 mmol/L (ref 22–32)
Calcium: 9.3 mg/dL (ref 8.9–10.3)
Creatinine, Ser: 1.19 mg/dL — ABNORMAL HIGH (ref 0.44–1.00)
GFR calc Af Amer: 52 mL/min — ABNORMAL LOW (ref >60)
GFR calc non Af Amer: 44 mL/min — ABNORMAL LOW (ref >60)
Glucose, Bld: 128 mg/dL — ABNORMAL HIGH (ref 65–99)
Potassium: 3.8 mmol/L (ref 3.5–5.1)
Sodium: 139 mmol/L (ref 135–145)

## 2016-10-06 LAB — POCT I-STAT 3, VENOUS BLOOD GAS (G3P V)
ACID-BASE DEFICIT: 3 mmol/L — AB (ref 0.0–2.0)
BICARBONATE: 23 mmol/L (ref 20.0–28.0)
O2 Saturation: 98 %
TCO2: 24 mmol/L (ref 0–100)
pCO2, Ven: 42.6 mmHg — ABNORMAL LOW (ref 44.0–60.0)
pH, Ven: 7.332 (ref 7.250–7.430)
pO2, Ven: 106 mmHg — ABNORMAL HIGH (ref 32.0–45.0)

## 2016-10-06 LAB — POCT I-STAT 3, ART BLOOD GAS (G3+)
Acid-base deficit: 3 mmol/L — ABNORMAL HIGH (ref 0.0–2.0)
BICARBONATE: 24.7 mmol/L (ref 20.0–28.0)
BICARBONATE: 22.9 mmol/L (ref 20.0–28.0)
Bicarbonate: 25.7 mmol/L (ref 20.0–28.0)
O2 SAT: 96 %
O2 Saturation: 100 %
O2 Saturation: 99 %
PH ART: 7.42 (ref 7.350–7.450)
PH ART: 7.309 — AB (ref 7.350–7.450)
PO2 ART: 320 mmHg — AB (ref 83.0–108.0)
PO2 ART: 125 mmHg — AB (ref 83.0–108.0)
Patient temperature: 36.3
TCO2: 26 mmol/L (ref 0–100)
TCO2: 24 mmol/L (ref 0–100)
TCO2: 27 mmol/L (ref 0–100)
pCO2 arterial: 38.1 mmHg (ref 32.0–48.0)
pCO2 arterial: 45.2 mmHg (ref 32.0–48.0)
pCO2 arterial: 46.9 mmHg (ref 32.0–48.0)
pH, Arterial: 7.343 — ABNORMAL LOW (ref 7.350–7.450)
pO2, Arterial: 83 mmHg (ref 83.0–108.0)

## 2016-10-06 LAB — BLOOD GAS, ARTERIAL
ACID-BASE DEFICIT: 0.1 mmol/L (ref 0.0–2.0)
Bicarbonate: 23.9 mmol/L (ref 20.0–28.0)
Drawn by: 40415
FIO2: 21
O2 Saturation: 94.2 %
PCO2 ART: 37.9 mmHg (ref 32.0–48.0)
PO2 ART: 76 mmHg — AB (ref 83.0–108.0)
Patient temperature: 98.6
pH, Arterial: 7.415 (ref 7.350–7.450)

## 2016-10-06 LAB — LIPID PANEL
CHOL/HDL RATIO: 4.2 ratio
CHOLESTEROL: 202 mg/dL — AB (ref 0–200)
HDL: 48 mg/dL (ref >40)
LDL Cholesterol: 89 mg/dL (ref 0–99)
Triglycerides: 323 mg/dL — ABNORMAL HIGH (ref <150)
VLDL: 65 mg/dL — AB (ref 0–40)

## 2016-10-06 LAB — PLATELET COUNT: Platelets: 145 10*3/uL — ABNORMAL LOW (ref 150–400)

## 2016-10-06 LAB — CREATININE, SERUM
Creatinine, Ser: 0.86 mg/dL (ref 0.44–1.00)
GFR calc Af Amer: >60 mL/min
GFR calc non Af Amer: >60 mL/min

## 2016-10-06 LAB — HEMOGLOBIN AND HEMATOCRIT, BLOOD
HEMATOCRIT: 23.4 % — AB (ref 36.0–46.0)
HEMOGLOBIN: 7.9 g/dL — AB (ref 12.0–15.0)

## 2016-10-06 LAB — MAGNESIUM: Magnesium: 3.1 mg/dL — ABNORMAL HIGH (ref 1.7–2.4)

## 2016-10-06 LAB — GLUCOSE, CAPILLARY
GLUCOSE-CAPILLARY: 123 mg/dL — AB (ref 65–99)
GLUCOSE-CAPILLARY: 153 mg/dL — AB (ref 65–99)
Glucose-Capillary: 98 mg/dL (ref 65–99)
Glucose-Capillary: 137 mg/dL — ABNORMAL HIGH (ref 65–99)
Glucose-Capillary: 125 mg/dL — ABNORMAL HIGH (ref 65–99)
Glucose-Capillary: 66 mg/dL (ref 65–99)
Glucose-Capillary: 184 mg/dL — ABNORMAL HIGH (ref 65–99)
Glucose-Capillary: 128 mg/dL — ABNORMAL HIGH (ref 65–99)

## 2016-10-06 LAB — PREPARE RBC (CROSSMATCH)

## 2016-10-06 LAB — HEPARIN LEVEL (UNFRACTIONATED): Heparin Unfractionated: 0.38 IU/mL (ref 0.30–0.70)

## 2016-10-06 LAB — APTT: aPTT: 33 s (ref 24–36)

## 2016-10-06 SURGERY — CORONARY ARTERY BYPASS GRAFTING (CABG)
Anesthesia: General | Site: Chest

## 2016-10-06 MED ORDER — PANTOPRAZOLE SODIUM 40 MG PO TBEC
40.0000 mg | DELAYED_RELEASE_TABLET | Freq: Every day | ORAL | Status: DC
  Administered 2016-10-08 – 2016-10-12 (×5): 40 mg via ORAL
  Filled 2016-10-06 (×5): qty 1

## 2016-10-06 MED ORDER — VANCOMYCIN HCL IN DEXTROSE 1-5 GM/200ML-% IV SOLN
1000.0000 mg | Freq: Once | INTRAVENOUS | Status: AC
Start: 2016-10-06 — End: 2016-10-06
  Administered 2016-10-06: 1000 mg via INTRAVENOUS
  Filled 2016-10-06: qty 200

## 2016-10-06 MED ORDER — ACETAMINOPHEN 160 MG/5ML PO SOLN: 1000.0000 mg | Freq: Four times a day (QID) | ORAL | Status: DC

## 2016-10-06 MED ORDER — SODIUM CHLORIDE 0.9 % IR SOLN
Status: DC | PRN
  Administered 2016-10-06: 6000 mL

## 2016-10-06 MED ORDER — ACETAMINOPHEN 500 MG PO TABS
1000.0000 mg | ORAL_TABLET | Freq: Four times a day (QID) | ORAL | Status: AC
  Administered 2016-10-07 – 2016-10-11 (×16): 1000 mg via ORAL
  Filled 2016-10-06 (×16): qty 2

## 2016-10-06 MED ORDER — MIDAZOLAM HCL 2 MG/2ML IJ SOLN
2.0000 mg | INTRAMUSCULAR | Status: DC | PRN
Start: 2016-10-06 — End: 2016-10-09

## 2016-10-06 MED ORDER — HEMOSTATIC AGENTS (NO CHARGE) OPTIME
TOPICAL | Status: DC | PRN
  Administered 2016-10-06: 1 via TOPICAL

## 2016-10-06 MED ORDER — ALBUMIN HUMAN 5 % IV SOLN: 250.0000 mL | INTRAVENOUS | Status: AC | PRN

## 2016-10-06 MED ORDER — PHENYLEPHRINE HCL 10 MG/ML IJ SOLN
INTRAVENOUS | Status: DC | PRN
  Administered 2016-10-06: 15 ug/min via INTRAVENOUS

## 2016-10-06 MED ORDER — LACTATED RINGERS IV SOLN
INTRAVENOUS | Status: DC | PRN
  Administered 2016-10-06: 07:00:00 via INTRAVENOUS

## 2016-10-06 MED ORDER — SODIUM CHLORIDE 0.9 % IV SOLN
0.0000 ug/kg/h | INTRAVENOUS | Status: DC
  Administered 2016-10-06: 0.2 ug/kg/h via INTRAVENOUS
  Filled 2016-10-06 (×3): qty 2

## 2016-10-06 MED ORDER — SODIUM CHLORIDE 0.9 % IV SOLN: Freq: Once | INTRAVENOUS | Status: DC

## 2016-10-06 MED ORDER — ACETAMINOPHEN 650 MG RE SUPP
650.0000 mg | Freq: Once | RECTAL | Status: AC
  Administered 2016-10-06: 650 mg via RECTAL

## 2016-10-06 MED ORDER — HEPARIN SODIUM (PORCINE) 1000 UNIT/ML IJ SOLN
INTRAMUSCULAR | Status: AC
  Filled 2016-10-06: qty 1

## 2016-10-06 MED ORDER — SODIUM CHLORIDE 0.45 % IV SOLN
INTRAVENOUS | Status: DC | PRN
Start: 2016-10-06 — End: 2016-10-09

## 2016-10-06 MED ORDER — DOCUSATE SODIUM 100 MG PO CAPS
200.0000 mg | ORAL_CAPSULE | Freq: Every day | ORAL | Status: DC
Start: 2016-10-07 — End: 2016-10-12
  Administered 2016-10-07 – 2016-10-12 (×6): 200 mg via ORAL
  Filled 2016-10-06 (×6): qty 2

## 2016-10-06 MED ORDER — PHENYLEPHRINE 40 MCG/ML (10ML) SYRINGE FOR IV PUSH (FOR BLOOD PRESSURE SUPPORT)
PREFILLED_SYRINGE | INTRAVENOUS | Status: AC
  Filled 2016-10-06: qty 10

## 2016-10-06 MED ORDER — TRAMADOL HCL 50 MG PO TABS: 50.0000 mg | ORAL_TABLET | ORAL | Status: DC | PRN

## 2016-10-06 MED ORDER — PHENYLEPHRINE 40 MCG/ML (10ML) SYRINGE FOR IV PUSH (FOR BLOOD PRESSURE SUPPORT)
PREFILLED_SYRINGE | INTRAVENOUS | Status: DC | PRN
  Administered 2016-10-06: 40 ug via INTRAVENOUS
  Administered 2016-10-06: 80 ug via INTRAVENOUS

## 2016-10-06 MED ORDER — PROTAMINE SULFATE 10 MG/ML IV SOLN
INTRAVENOUS | Status: DC | PRN
  Administered 2016-10-06: 200 mg via INTRAVENOUS

## 2016-10-06 MED ORDER — ASPIRIN 81 MG PO CHEW: 324.0000 mg | CHEWABLE_TABLET | Freq: Every day | ORAL | Status: DC

## 2016-10-06 MED ORDER — SODIUM CHLORIDE 0.9% FLUSH
3.0000 mL | Freq: Two times a day (BID) | INTRAVENOUS | Status: DC
  Administered 2016-10-07 – 2016-10-08 (×4): 3 mL via INTRAVENOUS

## 2016-10-06 MED ORDER — MAGNESIUM SULFATE 4 GM/100ML IV SOLN
4.0000 g | Freq: Once | INTRAVENOUS | Status: AC
Start: 2016-10-06 — End: 2016-10-06
  Administered 2016-10-06: 4 g via INTRAVENOUS
  Filled 2016-10-06: qty 100

## 2016-10-06 MED ORDER — CHLORHEXIDINE GLUCONATE 0.12% ORAL RINSE (MEDLINE KIT)
15.0000 mL | Freq: Two times a day (BID) | OROMUCOSAL | Status: DC
  Administered 2016-10-06 – 2016-10-08 (×5): 15 mL via OROMUCOSAL

## 2016-10-06 MED ORDER — LACTATED RINGERS IV SOLN
INTRAVENOUS | Status: DC | PRN
  Administered 2016-10-06: 06:00:00 via INTRAVENOUS

## 2016-10-06 MED ORDER — ALBUMIN HUMAN 5 % IV SOLN
INTRAVENOUS | Status: DC | PRN
  Administered 2016-10-06 (×5): via INTRAVENOUS

## 2016-10-06 MED ORDER — VECURONIUM BROMIDE 10 MG IV SOLR
INTRAVENOUS | Status: AC
  Filled 2016-10-06: qty 10

## 2016-10-06 MED ORDER — LACTATED RINGERS IV SOLN: INTRAVENOUS | Status: DC

## 2016-10-06 MED ORDER — CEFUROXIME SODIUM 1.5 G IJ SOLR
1.5000 g | Freq: Two times a day (BID) | INTRAMUSCULAR | Status: AC
  Administered 2016-10-06 – 2016-10-08 (×4): 1.5 g via INTRAVENOUS
  Filled 2016-10-06 (×4): qty 1.5

## 2016-10-06 MED ORDER — PROPOFOL 10 MG/ML IV BOLUS
INTRAVENOUS | Status: DC | PRN
  Administered 2016-10-06: 100 mg via INTRAVENOUS

## 2016-10-06 MED ORDER — SODIUM CHLORIDE 0.9 % IJ SOLN
INTRAMUSCULAR | Status: AC
  Filled 2016-10-06: qty 10

## 2016-10-06 MED ORDER — CHLORHEXIDINE GLUCONATE CLOTH 2 % EX PADS: 6.0000 | MEDICATED_PAD | Freq: Once | CUTANEOUS | Status: DC

## 2016-10-06 MED ORDER — SODIUM CHLORIDE 0.9 % IV SOLN
30.0000 meq | Freq: Once | INTRAVENOUS | Status: DC
  Filled 2016-10-06: qty 15

## 2016-10-06 MED ORDER — PROPOFOL 10 MG/ML IV BOLUS
INTRAVENOUS | Status: AC
  Filled 2016-10-06: qty 20

## 2016-10-06 MED ORDER — EPHEDRINE 5 MG/ML INJ
INTRAVENOUS | Status: AC
  Filled 2016-10-06: qty 10

## 2016-10-06 MED ORDER — ONDANSETRON HCL 4 MG/2ML IJ SOLN
4.0000 mg | Freq: Four times a day (QID) | INTRAMUSCULAR | Status: DC | PRN
  Administered 2016-10-06 – 2016-10-08 (×4): 4 mg via INTRAVENOUS
  Filled 2016-10-06 (×4): qty 2

## 2016-10-06 MED ORDER — ACETAMINOPHEN 160 MG/5ML PO SOLN: 650.0000 mg | Freq: Once | ORAL | Status: AC

## 2016-10-06 MED ORDER — SODIUM CHLORIDE 0.9 % IV SOLN: 250.0000 mL | INTRAVENOUS | Status: DC

## 2016-10-06 MED ORDER — CHLORHEXIDINE GLUCONATE 0.12 % MT SOLN
15.0000 mL | OROMUCOSAL | Status: AC
  Administered 2016-10-06: 15 mL via OROMUCOSAL

## 2016-10-06 MED ORDER — OXYCODONE HCL 5 MG PO TABS
5.0000 mg | ORAL_TABLET | ORAL | Status: DC | PRN
  Administered 2016-10-07: 10 mg via ORAL
  Administered 2016-10-08 – 2016-10-11 (×4): 5 mg via ORAL
  Filled 2016-10-06: qty 2
  Filled 2016-10-06 (×4): qty 1

## 2016-10-06 MED ORDER — FAMOTIDINE IN NACL 20-0.9 MG/50ML-% IV SOLN
20.0000 mg | Freq: Two times a day (BID) | INTRAVENOUS | Status: AC
  Administered 2016-10-06: 20 mg via INTRAVENOUS

## 2016-10-06 MED ORDER — INSULIN REGULAR BOLUS VIA INFUSION
0.0000 [IU] | Freq: Three times a day (TID) | INTRAVENOUS | Status: DC
  Filled 2016-10-06: qty 10

## 2016-10-06 MED ORDER — FENTANYL CITRATE (PF) 250 MCG/5ML IJ SOLN
INTRAMUSCULAR | Status: AC
  Filled 2016-10-06: qty 25

## 2016-10-06 MED ORDER — FOLIC ACID 1 MG PO TABS
1.0000 mg | ORAL_TABLET | Freq: Every day | ORAL | Status: DC
  Administered 2016-10-07 – 2016-10-12 (×6): 1 mg via ORAL
  Filled 2016-10-06 (×6): qty 1

## 2016-10-06 MED ORDER — ASPIRIN EC 325 MG PO TBEC
325.0000 mg | DELAYED_RELEASE_TABLET | Freq: Every day | ORAL | Status: DC
  Administered 2016-10-07 – 2016-10-10 (×4): 325 mg via ORAL
  Filled 2016-10-06 (×4): qty 1

## 2016-10-06 MED ORDER — SODIUM CHLORIDE 0.9 % IV SOLN
Freq: Once | INTRAVENOUS | Status: AC
  Administered 2016-10-06: 15:00:00 via INTRAVENOUS

## 2016-10-06 MED ORDER — METOPROLOL TARTRATE 5 MG/5ML IV SOLN: 2.5000 mg | INTRAVENOUS | Status: DC | PRN

## 2016-10-06 MED ORDER — ROCURONIUM BROMIDE 50 MG/5ML IV SOSY
PREFILLED_SYRINGE | INTRAVENOUS | Status: AC
  Filled 2016-10-06: qty 5

## 2016-10-06 MED ORDER — SODIUM CHLORIDE 0.9 % IV SOLN: INTRAVENOUS | Status: DC

## 2016-10-06 MED ORDER — METOPROLOL TARTRATE 12.5 MG HALF TABLET
12.5000 mg | ORAL_TABLET | Freq: Two times a day (BID) | ORAL | Status: DC
  Administered 2016-10-07: 12.5 mg via ORAL
  Filled 2016-10-06: qty 1

## 2016-10-06 MED ORDER — ROCURONIUM BROMIDE 100 MG/10ML IV SOLN
INTRAVENOUS | Status: DC | PRN
  Administered 2016-10-06: 50 mg via INTRAVENOUS

## 2016-10-06 MED ORDER — MIDAZOLAM HCL 10 MG/2ML IJ SOLN
INTRAMUSCULAR | Status: AC
Start: 2016-10-06 — End: 2016-10-06
  Filled 2016-10-06: qty 2

## 2016-10-06 MED ORDER — MORPHINE SULFATE (PF) 4 MG/ML IV SOLN
1.0000 mg | INTRAVENOUS | Status: AC | PRN
  Administered 2016-10-06: 4 mg via INTRAVENOUS

## 2016-10-06 MED ORDER — LACTATED RINGERS IV SOLN: 500.0000 mL | Freq: Once | INTRAVENOUS | Status: DC | PRN

## 2016-10-06 MED ORDER — HEPARIN SODIUM (PORCINE) 1000 UNIT/ML IJ SOLN
INTRAMUSCULAR | Status: DC | PRN
  Administered 2016-10-06: 17000 [IU] via INTRAVENOUS
  Administered 2016-10-06: 2000 [IU] via INTRAVENOUS

## 2016-10-06 MED ORDER — METOPROLOL TARTRATE 25 MG/10 ML ORAL SUSPENSION: 12.5000 mg | Freq: Two times a day (BID) | ORAL | Status: DC

## 2016-10-06 MED ORDER — BISACODYL 5 MG PO TBEC
10.0000 mg | DELAYED_RELEASE_TABLET | Freq: Every day | ORAL | Status: DC
  Administered 2016-10-07 – 2016-10-10 (×4): 10 mg via ORAL
  Filled 2016-10-06 (×5): qty 2

## 2016-10-06 MED ORDER — EPHEDRINE SULFATE-NACL 50-0.9 MG/10ML-% IV SOSY
PREFILLED_SYRINGE | INTRAVENOUS | Status: DC | PRN
  Administered 2016-10-06 (×2): 5 mg via INTRAVENOUS
  Administered 2016-10-06: 10 mg via INTRAVENOUS

## 2016-10-06 MED ORDER — MORPHINE SULFATE (PF) 4 MG/ML IV SOLN
2.0000 mg | INTRAVENOUS | Status: DC | PRN
  Administered 2016-10-06: 4 mg via INTRAVENOUS
  Administered 2016-10-07 (×2): 2 mg via INTRAVENOUS
  Filled 2016-10-06 (×4): qty 1

## 2016-10-06 MED ORDER — ORAL CARE MOUTH RINSE
15.0000 mL | Freq: Four times a day (QID) | OROMUCOSAL | Status: DC
  Administered 2016-10-06 – 2016-10-09 (×9): 15 mL via OROMUCOSAL

## 2016-10-06 MED ORDER — SODIUM CHLORIDE 0.9% FLUSH: 3.0000 mL | INTRAVENOUS | Status: DC | PRN

## 2016-10-06 MED ORDER — INSULIN REGULAR HUMAN 100 UNIT/ML IJ SOLN
INTRAMUSCULAR | Status: DC
  Filled 2016-10-06: qty 2.5

## 2016-10-06 MED ORDER — VECURONIUM BROMIDE 10 MG IV SOLR
INTRAVENOUS | Status: DC | PRN
  Administered 2016-10-06: 10 mg via INTRAVENOUS
  Administered 2016-10-06: 5 mg via INTRAVENOUS

## 2016-10-06 MED ORDER — SODIUM CHLORIDE 0.9 % IV SOLN
0.0000 ug/min | INTRAVENOUS | Status: DC
  Filled 2016-10-06: qty 2

## 2016-10-06 MED ORDER — FENTANYL CITRATE (PF) 250 MCG/5ML IJ SOLN
INTRAMUSCULAR | Status: DC | PRN
  Administered 2016-10-06 (×3): 100 ug via INTRAVENOUS
  Administered 2016-10-06: 250 ug via INTRAVENOUS
  Administered 2016-10-06: 150 ug via INTRAVENOUS
  Administered 2016-10-06 (×2): 100 ug via INTRAVENOUS
  Administered 2016-10-06: 150 ug via INTRAVENOUS
  Administered 2016-10-06: 50 ug via INTRAVENOUS

## 2016-10-06 MED ORDER — MIDAZOLAM HCL 5 MG/5ML IJ SOLN
INTRAMUSCULAR | Status: DC | PRN
  Administered 2016-10-06: 1 mg via INTRAVENOUS
  Administered 2016-10-06 (×2): 2 mg via INTRAVENOUS

## 2016-10-06 MED ORDER — DEXMEDETOMIDINE HCL IN NACL 400 MCG/100ML IV SOLN: 0.1000 ug/kg/h | INTRAVENOUS | Status: DC

## 2016-10-06 MED ORDER — DEXTROSE 50 % IV SOLN
INTRAVENOUS | Status: AC
  Administered 2016-10-06: 14 mL
  Filled 2016-10-06: qty 50

## 2016-10-06 MED ORDER — PROTAMINE SULFATE 10 MG/ML IV SOLN
INTRAVENOUS | Status: AC
  Filled 2016-10-06: qty 25

## 2016-10-06 MED ORDER — SODIUM CHLORIDE 0.9 % IJ SOLN
OROMUCOSAL | Status: DC | PRN
  Administered 2016-10-06 (×3): 4 mL via TOPICAL

## 2016-10-06 MED ORDER — SODIUM BICARBONATE 8.4 % IV SOLN
50.0000 meq | Freq: Once | INTRAVENOUS | Status: AC
  Administered 2016-10-06: 50 meq via INTRAVENOUS

## 2016-10-06 MED ORDER — BISACODYL 10 MG RE SUPP: 10.0000 mg | Freq: Every day | RECTAL | Status: DC

## 2016-10-06 MED ORDER — NITROGLYCERIN IN D5W 200-5 MCG/ML-% IV SOLN: 0.0000 ug/min | INTRAVENOUS | Status: DC

## 2016-10-06 MED ORDER — NITROGLYCERIN 0.2 MG/ML ON CALL CATH LAB
INTRAVENOUS | Status: DC | PRN
  Administered 2016-10-06: .02 mg via INTRAVENOUS
  Administered 2016-10-06: .04 mg via INTRAVENOUS

## 2016-10-06 MED FILL — Mannitol IV Soln 20%: INTRAVENOUS | Qty: 500 | Status: AC

## 2016-10-06 MED FILL — Albumin, Human Inj 5%: INTRAVENOUS | Qty: 250 | Status: AC

## 2016-10-06 MED FILL — Sodium Bicarbonate IV Soln 8.4%: INTRAVENOUS | Qty: 50 | Status: AC

## 2016-10-06 MED FILL — Potassium Chloride Inj 2 mEq/ML: INTRAVENOUS | Qty: 40 | Status: AC

## 2016-10-06 MED FILL — Sodium Chloride IV Soln 0.9%: INTRAVENOUS | Qty: 2000 | Status: AC

## 2016-10-06 MED FILL — Heparin Sodium (Porcine) Inj 1000 Unit/ML: INTRAMUSCULAR | Qty: 30 | Status: AC

## 2016-10-06 MED FILL — Heparin Sodium (Porcine) Inj 1000 Unit/ML: INTRAMUSCULAR | Qty: 20 | Status: AC

## 2016-10-06 MED FILL — Electrolyte-R (PH 7.4) Solution: INTRAVENOUS | Qty: 4000 | Status: AC

## 2016-10-06 MED FILL — Magnesium Sulfate Inj 50%: INTRAMUSCULAR | Qty: 2 | Status: AC

## 2016-10-06 MED FILL — Lidocaine HCl IV Inj 20 MG/ML: INTRAVENOUS | Qty: 5 | Status: AC

## 2016-10-06 SURGICAL SUPPLY — 97 items
BAG DECANTER FOR FLEXI CONT (MISCELLANEOUS) ×4 IMPLANT
BANDAGE ACE 4X5 VEL STRL LF (GAUZE/BANDAGES/DRESSINGS) ×8 IMPLANT
BANDAGE ACE 6X5 VEL STRL LF (GAUZE/BANDAGES/DRESSINGS) ×8 IMPLANT
BASKET HEART  (ORDER IN 25'S) (MISCELLANEOUS) ×1
BASKET HEART (ORDER IN 25'S) (MISCELLANEOUS) ×1
BASKET HEART (ORDER IN 25S) (MISCELLANEOUS) ×2 IMPLANT
BLADE STERNUM SYSTEM 6 (BLADE) ×4 IMPLANT
BLADE SURG 11 STRL SS (BLADE) ×4 IMPLANT
BNDG GAUZE ELAST 4 BULKY (GAUZE/BANDAGES/DRESSINGS) ×8 IMPLANT
CANISTER SUCT 3000ML PPV (MISCELLANEOUS) ×4 IMPLANT
CANNULA EZ GLIDE AORTIC 21FR (CANNULA) ×4 IMPLANT
CATH CPB KIT HENDRICKSON (MISCELLANEOUS) ×4 IMPLANT
CATH ROBINSON RED A/P 18FR (CATHETERS) ×8 IMPLANT
CATH THORACIC 36FR (CATHETERS) ×4 IMPLANT
CATH THORACIC 36FR RT ANG (CATHETERS) ×4 IMPLANT
CLIP FOGARTY SPRING 6M (CLIP) ×4 IMPLANT
CLIP TI MEDIUM 24 (CLIP) IMPLANT
CLIP TI WIDE RED SMALL 24 (CLIP) ×12 IMPLANT
CRADLE DONUT ADULT HEAD (MISCELLANEOUS) ×4 IMPLANT
DERMABOND ADHESIVE PROPEN (GAUZE/BANDAGES/DRESSINGS) ×2
DERMABOND ADVANCED (GAUZE/BANDAGES/DRESSINGS) ×2
DERMABOND ADVANCED .7 DNX12 (GAUZE/BANDAGES/DRESSINGS) ×2 IMPLANT
DERMABOND ADVANCED .7 DNX6 (GAUZE/BANDAGES/DRESSINGS) ×2 IMPLANT
DRAPE CARDIOVASCULAR INCISE (DRAPES) ×2
DRAPE SLUSH/WARMER DISC (DRAPES) ×4 IMPLANT
DRAPE SRG 135X102X78XABS (DRAPES) ×2 IMPLANT
DRSG COVADERM 4X14 (GAUZE/BANDAGES/DRESSINGS) ×4 IMPLANT
ELECT REM PT RETURN 9FT ADLT (ELECTROSURGICAL) ×8
ELECTRODE REM PT RTRN 9FT ADLT (ELECTROSURGICAL) ×4 IMPLANT
FELT TEFLON 1X6 (MISCELLANEOUS) ×4 IMPLANT
GAUZE SPONGE 4X4 12PLY STRL (GAUZE/BANDAGES/DRESSINGS) ×8 IMPLANT
GAUZE SPONGE 4X4 12PLY STRL LF (GAUZE/BANDAGES/DRESSINGS) ×12 IMPLANT
GLOVE BIO SURGEON STRL SZ7 (GLOVE) ×16 IMPLANT
GLOVE BIOGEL PI IND STRL 6 (GLOVE) ×6 IMPLANT
GLOVE BIOGEL PI IND STRL 7.0 (GLOVE) ×4 IMPLANT
GLOVE BIOGEL PI INDICATOR 6 (GLOVE) ×6
GLOVE BIOGEL PI INDICATOR 7.0 (GLOVE) ×4
GLOVE SURG SIGNA 7.5 PF LTX (GLOVE) ×12 IMPLANT
GOWN STRL REUS W/ TWL LRG LVL3 (GOWN DISPOSABLE) ×16 IMPLANT
GOWN STRL REUS W/ TWL XL LVL3 (GOWN DISPOSABLE) ×4 IMPLANT
GOWN STRL REUS W/TWL LRG LVL3 (GOWN DISPOSABLE) ×16
GOWN STRL REUS W/TWL XL LVL3 (GOWN DISPOSABLE) ×4
HEMOSTAT POWDER SURGIFOAM 1G (HEMOSTASIS) ×12 IMPLANT
HEMOSTAT SURGICEL 2X14 (HEMOSTASIS) ×4 IMPLANT
INSERT FOGARTY XLG (MISCELLANEOUS) IMPLANT
KIT BASIN OR (CUSTOM PROCEDURE TRAY) ×4 IMPLANT
KIT ROOM TURNOVER OR (KITS) ×4 IMPLANT
KIT SUCTION CATH 14FR (SUCTIONS) ×8 IMPLANT
KIT VASOVIEW HEMOPRO VH 3000 (KITS) ×4 IMPLANT
MARKER GRAFT CORONARY BYPASS (MISCELLANEOUS) ×12 IMPLANT
NS IRRIG 1000ML POUR BTL (IV SOLUTION) ×28 IMPLANT
PACK OPEN HEART (CUSTOM PROCEDURE TRAY) ×4 IMPLANT
PAD ARMBOARD 7.5X6 YLW CONV (MISCELLANEOUS) ×8 IMPLANT
PAD ELECT DEFIB RADIOL ZOLL (MISCELLANEOUS) ×4 IMPLANT
PENCIL BUTTON HOLSTER BLD 10FT (ELECTRODE) ×4 IMPLANT
PUNCH AORTIC ROTATE 4.0MM (MISCELLANEOUS) ×4 IMPLANT
PUNCH AORTIC ROTATE 4.5MM 8IN (MISCELLANEOUS) IMPLANT
PUNCH AORTIC ROTATE 5MM 8IN (MISCELLANEOUS) IMPLANT
SET CARDIOPLEGIA MPS 5001102 (MISCELLANEOUS) ×4 IMPLANT
SPONGE LAP 18X18 X RAY DECT (DISPOSABLE) ×4 IMPLANT
SUT BONE WAX W31G (SUTURE) ×4 IMPLANT
SUT MNCRL AB 3-0 PS2 18 (SUTURE) ×4 IMPLANT
SUT MNCRL AB 4-0 PS2 18 (SUTURE) ×8 IMPLANT
SUT PROLENE 3 0 SH DA (SUTURE) ×4 IMPLANT
SUT PROLENE 4 0 RB 1 (SUTURE)
SUT PROLENE 4 0 SH DA (SUTURE) IMPLANT
SUT PROLENE 4-0 RB1 .5 CRCL 36 (SUTURE) IMPLANT
SUT PROLENE 6 0 C 1 30 (SUTURE) ×16 IMPLANT
SUT PROLENE 7 0 BV 1 (SUTURE) ×4 IMPLANT
SUT PROLENE 7 0 BV1 MDA (SUTURE) ×12 IMPLANT
SUT PROLENE 8 0 BV175 6 (SUTURE) IMPLANT
SUT STEEL 6MS V (SUTURE) ×4 IMPLANT
SUT STEEL STERNAL CCS#1 18IN (SUTURE) IMPLANT
SUT STEEL SZ 6 DBL 3X14 BALL (SUTURE) ×4 IMPLANT
SUT VIC AB 1 CTX 36 (SUTURE) ×4
SUT VIC AB 1 CTX36XBRD ANBCTR (SUTURE) ×4 IMPLANT
SUT VIC AB 2-0 CT1 27 (SUTURE) ×2
SUT VIC AB 2-0 CT1 TAPERPNT 27 (SUTURE) ×2 IMPLANT
SUT VIC AB 2-0 CTX 27 (SUTURE) IMPLANT
SUT VIC AB 3-0 SH 27 (SUTURE)
SUT VIC AB 3-0 SH 27X BRD (SUTURE) IMPLANT
SUT VIC AB 3-0 X1 27 (SUTURE) ×24 IMPLANT
SUT VICRYL 4-0 PS2 18IN ABS (SUTURE) IMPLANT
SUTURE E-PAK OPEN HEART (SUTURE) ×4 IMPLANT
SYSTEM SAHARA CHEST DRAIN ATS (WOUND CARE) ×4 IMPLANT
TAPE CLOTH SURG 4X10 WHT LF (GAUZE/BANDAGES/DRESSINGS) ×4 IMPLANT
TAPE PAPER 2X10 WHT MICROPORE (GAUZE/BANDAGES/DRESSINGS) ×4 IMPLANT
TOWEL GREEN STERILE (TOWEL DISPOSABLE) ×8 IMPLANT
TOWEL GREEN STERILE FF (TOWEL DISPOSABLE) ×6 IMPLANT
TOWEL OR 17X24 6PK STRL BLUE (TOWEL DISPOSABLE) ×4 IMPLANT
TOWEL OR 17X26 10 PK STRL BLUE (TOWEL DISPOSABLE) ×4 IMPLANT
TRAY FOLEY SILVER 16FR TEMP (SET/KITS/TRAYS/PACK) ×4 IMPLANT
TUBE FEEDING 8FR 16IN STR KANG (MISCELLANEOUS) ×4 IMPLANT
TUBING INSUFFLATION (TUBING) ×4 IMPLANT
UNDERPAD 30X30 (UNDERPADS AND DIAPERS) ×4 IMPLANT
WATER STERILE IRR 1000ML POUR (IV SOLUTION) ×8 IMPLANT
YANKAUER SUCT BULB TIP NO VENT (SUCTIONS) ×4 IMPLANT

## 2016-10-06 NOTE — Op Note (Signed)
[name], [name] NO.:  [id number]  MEDICAL RECORD NO.:  [id number]  LOCATION:  2H15C                        FACILITY:  [facility]  PHYSICIAN:  Revonda Standard. Roxan Hockey, M.D.DATE OF BIRTH:  30-Sep-1945  DATE OF PROCEDURE:  10/06/2016 DATE OF DISCHARGE:                              OPERATIVE REPORT   PREOPERATIVE DIAGNOSIS:  Severe 3-vessel disease, status post non-ST elevation myocardial infarction.  POSTOPERATIVE DIAGNOSIS:  Severe 3-vessel disease, status post non-ST elevation myocardial infarction.  PROCEDURE:   Median sternotomy; extracorporeal circulation; Coronary artery bypass grafting x 4  Left internal mammary artery to left anterior descending  Saphenous vein graft to first diagonal  Saphenous vein graft to obtuse marginal  Saphenous vein graft to posterior descending Endoscopic vein harvest from right leg and left thigh; open vein harvest from left upper calf.  SURGEON:  Revonda Standard. Roxan Hockey, M.D.  ASSISTANT:  Lars Pinks, PA.  ANESTHESIA:  General.  FINDINGS:  Transesophageal echocardiography revealed preserved left ventricular wall motion with no significant valvular pathology. Sternal osteoporosis.  Vein from upper thigh on right good quality, but lower thigh and upper calf poor quality.  Vein from left leg good quality. Good quality mammary artery.  Good quality targets.  CLINICAL NOTE:  [name] is a 71 year old woman with a longstanding history of coronary disease with multiple previous stents.  She presented with acute onset of chest pain and ruled in for a non-ST elevation MI.  At catheterization, she had high-grade in-stent stenosis in the right coronary.  Flow was reestablished and there was resolution of symptoms, but she had residual disease at that site as well as in-stent restenosis in the LAD and circumflex.  She was advised to undergo coronary artery bypass grafting.  The indications, risks, benefits, and alternatives  were discussed in detail with the patient.  She understood and accepted the risks and agreed to proceed.  OPERATIVE NOTE:  [name] was brought to the preoperative holding area on October 06, 2016.  Anesthesia placed a Swan-Ganz catheter and an arterial blood pressure monitoring line.  She was taken to the operating room, anesthetized, and intubated.  A Foley catheter was placed.  Intravenous antibiotics were administered.  Transesophageal echocardiography was performed by Dr. Kalman Shan.  It showed preserved left ventricular function with no significant valvular pathology.  The chest, abdomen, and legs were prepped and draped in usual sterile fashion.  An incision was made in the medial aspect of the right leg at the level of the knee.  Greater saphenous vein was identified.  It was harvested endoscopically from mid calf to groin.  Simultaneous with the vein harvest, a median sternotomy was performed and the left internal mammary artery was harvested using standard technique.  It was a good quality vessel.  2000 units of heparin was administered during the vessel harvest.  When preparing the right saphenous vein graft, there was a large portion of this graft that was unsuitable for use as a bypass graft due to varicosities and sclerotic areas.  Therefore, additional vein was harvested from the left thigh.  At the level of the knee, the vein became very superficial, so bridged open incisions were used in this  area. The vein from the left leg was of good quality.  The remainder of the full heparin dose was given.  The pericardium was opened.  The ascending aorta was inspected.  It was of normal caliber with no evidence of atherosclerotic disease.  The aorta was cannulated via concentric 2-0 Ethibond pledgeted pursestring sutures after confirming adequate anticoagulation with ACT measurement.  A dual stage venous cannula was placed via a pursestring suture in the right atrial appendage.   Cardiopulmonary bypass was initiated.  Flows were maintained per protocol.  The patient was cooled to 32 degrees Celsius.  The coronary arteries were inspected and anastomotic sites were chosen.  The conduits were inspected and cut to length.  A foam pad was placed in the pericardium to insulate the heart.  A temperature probe was placed in the myocardial septum, and a cardioplegia cannula was placed in the ascending aorta.  The aorta was crossclamped.  The left ventricle was emptied via the aortic root vent.  Cardiac arrest then was achieved with a combination of cold antegrade blood cardioplegia and topical iced saline.  500 mL of cardioplegia was administered.  There was a rapid diastolic arrest and septal cooling to 10 degrees Celsius.  A reversed saphenous vein graft then was placed end-to-side to the posterior descending branch of the right coronary.  This was a 1.5 mm target vessel at the site of the anastomosis.  All the coronaries did have diffuse plaquing, but a 1.5 mm probe did pass distally within the vessel and it was good quality at the site of the anastomosis.  The vein was anastomosed end-to-side with a running 7-0 Prolene suture.  All anastomoses were probed proximally and distally at their completion to ensure patency.  Cardioplegia was administered at the completion of each vein graft to assess flow and hemostasis, both were good.  Next, a reversed saphenous vein graft was placed end-to-side to the obtuse marginal.  There was a single large obtuse marginal branch of the circumflex.  This vessel was a 1.5 mm target vessel.  The vein was anastomosed end-to-side with a running 7-0 Prolene suture.  A probe passed easily proximally and distally.  There was good flow and also good hemostasis with cardioplegia administration.  Next, a reversed saphenous vein graft was placed end-to-side to the first diagonal branch of the LAD.  This was a large anterolateral branch,  it was 1.5 mm in diameter.  The vein was anastomosed end-to-side with a running 7-0 Prolene suture.  A probe passed easily proximally and distally, and there was good flow and also good hemostasis with cardioplegia administration.  The left internal mammary artery then was brought through a window in the pericardium.  The distal end was beveled, it was anastomosed end-to- side to the distal LAD.  The LAD was a 1.5 mm good quality target.  The mammary was a 1.5 mm good quality conduit.  The end-to-side anastomosis was performed with a running 8-0 Prolene suture.  At the completion of anastomosis, the bulldog clamp was briefly removed, and rapid septal rewarming was noted.  The bulldog clamp was replaced.  The mammary pedicle was tacked to the epicardial surface of the heart with 6-0 Prolene sutures.  Additional cardioplegia was administered.  The aortic cannula was removed from the ascending aorta, and the proximal vein graft anastomoses were performed to 4.0 mm punch aortotomies with running 6-0 Prolene sutures.  At the completion of the final proximal anastomosis, the patient was placed in Trendelenburg position.  Lidocaine was administered.  The bulldog clamp was removed from the left mammary artery.  The left ventricle and aortic root were de-aired, and the aortic crossclamp was removed.  The total crossclamp time was 71 minutes.  The patient required a single defibrillation with 10 joules and then was in a bradycardic rhythm thereafter.  While rewarming was completed, all proximal and distal anastomoses were inspected for hemostasis.  Epicardial pacing wires were placed on the right ventricle and right atrium.  Atrial pacing was initiated at 80 beats per minute.  When the patient had rewarmed to a core temperature of 37 degrees Celsius, she was weaned from cardiopulmonary bypass on the first attempt without inotropic support.  Total bypass time was 104 minutes.  The initial  cardiac index was greater than 2 L/min/m2.  Post bypass transesophageal echocardiography was unchanged from the prebypass study.  A test dose of protamine was administered and was well tolerated.  The atrial and aortic cannulae were removed.  The remainder of the protamine was administered without incident.  The chest was irrigated with warm saline.  The pericardium was reapproximated with interrupted 3-0 silk sutures.  It came together easily without tension or kinking the underlying grafts.  Left pleural and mediastinal chest tubes were placed through separate subcostal incisions.  The sternum was closed with a combination of single and double heavy gauge stainless steel wires.  The pectoralis fascia, subcutaneous tissue, and skin were closed in standard fashion. All sponge, needle, and instrument counts were correct at the end of the procedure.  The patient was taken from the operating room to the Surgical Intensive Care Unit intubated and in good condition.     Revonda Standard Roxan Hockey, M.D.     SCH/MEDQ  D:  10/06/2016  T:  10/06/2016  Job:  [id number]

## 2016-10-06 NOTE — H&P (View-Only) (Signed)
Reason for Consult:3 vessel CAD s/p MI Referring Physician: Dr. Monico Hoar [name] is an 71 y.o. female.  HPI: 71 yo woman with multiple CRF and known CAD presented with acute onset of severe CP radiating to back and left arm. Past history significant for hypertension, hyperlipidemia, type II diabetes, and tobacco abuse. Has had multiple PCI, first was in 2001, most recent in 2012. 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 EMS called. Noted to be in atrial fib with RVR and had inferior ST elevation. Taken emergently to cath lab. There was a high grade ISR in RCA with TIMI 2 flow. Angioplasty performed with improvement to TIMI-3 flow and resolution of symptoms. R/I for MI with troponin of 2.11.  Currently pain free.  She was on plavix prior to admission.  Past Medical History:  Diagnosis Date  . Arthritis   . Asthma   . Chest pain   . Diabetes mellitus   . Hypertension   . MI (myocardial infarction) 05/2002  . SOB (shortness of breath)     Past Surgical History:  Procedure Laterality Date  . CHOLECYSTECTOMY    . CORONARY BALLOON ANGIOPLASTY N/A 10/01/2016   Procedure: Coronary Balloon Angioplasty;  Surgeon: Belva Crome, MD;  Location: [facility];  Service: Cardiovascular;  Laterality: N/A;  . LEFT HEART CATH AND CORONARY ANGIOGRAPHY N/A 10/01/2016   Procedure: Left Heart Cath and Coronary Angiography;  Surgeon: Belva Crome, MD;  Location: [facility];  Service: Cardiovascular;  Laterality: N/A;    Family History  Problem Relation Age of Onset  . Cancer Mother 71  . Cancer Father 53  . Heart disease Sister 70    and cancer  . Cancer Brother 27  . Heart attack Brother 86    Social History:  reports that she has been smoking Cigarettes.  She has a 25.00 pack-year smoking history. She does  not have any smokeless tobacco history on file. She reports that she does not drink alcohol or use drugs.  Allergies:  Allergies  Allergen Reactions  . Ergocalciferol Nausea And Vomiting  . Niacin And Related Other (See Comments)    Burning feeling to face    Medications:  Scheduled: . amiodarone  150 mg Intravenous Once  . aspirin EC  81 mg Oral Daily  . atorvastatin  80 mg Oral q1800  . insulin aspart  0-9 Units Subcutaneous TID WC  . lisinopril  40 mg Oral Daily  . mouth rinse  15 mL Mouth Rinse BID  . metoprolol  25 mg Oral BID  . sodium chloride flush  3 mL Intravenous Q12H    Results for orders placed or performed during the hospital encounter of 10/01/16 (from the past 48 hour(s))  I-STAT, chem 8     Status: Abnormal   Collection Time: 10/01/16  2:48 PM  Result Value Ref Range   Sodium 140 135 - 145 mmol/L   Potassium 3.5 3.5 - 5.1 mmol/L   Chloride 104 101 - 111 mmol/L   BUN 24 (H) 6 - 20 mg/dL   Creatinine, Ser 1.10 (H) 0.44 - 1.00 mg/dL   Glucose, Bld 222 (H) 65 - 99 mg/dL   Calcium, Ion 1.24 1.15 - 1.40 mmol/L   TCO2 24 0 - 100 mmol/L   Hemoglobin  12.9 12.0 - 15.0 g/dL   HCT 38.0 36.0 - 46.0 %  POCT Activated clotting time     Status: None   Collection Time: 10/01/16  2:54 PM  Result Value Ref Range   Activated Clotting Time 456 seconds  POCT Activated clotting time     Status: None   Collection Time: 10/01/16  3:03 PM  Result Value Ref Range   Activated Clotting Time 312 seconds  MRSA PCR Screening     Status: None   Collection Time: 10/01/16  4:42 PM  Result Value Ref Range   MRSA by PCR NEGATIVE NEGATIVE    Comment:        The GeneXpert MRSA Assay (FDA approved for NASAL specimens only), is one component of a comprehensive MRSA colonization surveillance program. It is not intended to diagnose MRSA infection nor to guide or monitor treatment for MRSA infections.   Troponin I (serum)     Status: Abnormal   Collection Time: 10/01/16  4:45 PM   Result Value Ref Range   Troponin I 0.31 (HH) <0.03 ng/mL    Comment: CRITICAL RESULT CALLED TO, READ BACK BY AND VERIFIED WITH: J. MILFORD RN [id number] GREEN R   Hemoglobin A1c     Status: Abnormal   Collection Time: 10/01/16  4:45 PM  Result Value Ref Range   Hgb A1c MFr Bld 7.0 (H) 4.8 - 5.6 %    Comment: (NOTE)         Pre-diabetes: 5.7 - 6.4         Diabetes: >6.4         Glycemic control for adults with diabetes: <7.0    Mean Plasma Glucose 154 mg/dL    Comment: (NOTE) Performed At: [facility] [address] [place], [place] [id number] Raven Romp MD NM:[id number]   Glucose, capillary     Status: Abnormal   Collection Time: 10/01/16  5:02 PM  Result Value Ref Range   Glucose-Capillary 167 (H) 65 - 99 mg/dL   Comment 1 K   Troponin I (serum)     Status: Abnormal   Collection Time: 10/01/16 10:01 PM  Result Value Ref Range   Troponin I 2.10 (HH) <0.03 ng/mL    Comment: CRITICAL RESULT CALLED TO, READ BACK BY AND VERIFIED WITH: DUNN,J RN 10/01/2016 2300 JORDANS   Troponin I (serum)     Status: Abnormal   Collection Time: 10/02/16  4:51 AM  Result Value Ref Range   Troponin I 2.11 (HH) <0.03 ng/mL    Comment: CRITICAL VALUE NOTED.  VALUE IS CONSISTENT WITH PREVIOUSLY REPORTED AND CALLED VALUE.  Basic metabolic panel     Status: Abnormal   Collection Time: 10/02/16  4:51 AM  Result Value Ref Range   Sodium 139 135 - 145 mmol/L   Potassium 3.9 3.5 - 5.1 mmol/L   Chloride 108 101 - 111 mmol/L   CO2 24 22 - 32 mmol/L   Glucose, Bld 114 (H) 65 - 99 mg/dL   BUN 16 6 - 20 mg/dL   Creatinine, Ser 0.87 0.44 - 1.00 mg/dL   Calcium 8.6 (L) 8.9 - 10.3 mg/dL   GFR calc non Af Amer >60 >60 mL/min   GFR calc Af Amer >60 >60 mL/min    Comment: (NOTE) The eGFR has been calculated using the CKD EPI equation. This calculation has not been validated in all clinical situations. eGFR's persistently <60 mL/min signify possible Chronic Kidney Disease.    Anion gap  7  5 - 15  CBC     Status: Abnormal   Collection Time: 10/02/16  4:51 AM  Result Value Ref Range   WBC 8.6 4.0 - 10.5 K/uL   RBC 3.78 (L) 3.87 - 5.11 MIL/uL   Hemoglobin 11.1 (L) 12.0 - 15.0 g/dL   HCT 34.1 (L) 36.0 - 46.0 %   MCV 90.2 78.0 - 100.0 fL   MCH 29.4 26.0 - 34.0 pg   MCHC 32.6 30.0 - 36.0 g/dL   RDW 13.3 11.5 - 15.5 %   Platelets 177 150 - 400 K/uL  Glucose, capillary     Status: Abnormal   Collection Time: 10/02/16  7:40 AM  Result Value Ref Range   Glucose-Capillary 115 (H) 65 - 99 mg/dL   Comment 1 F   Platelet inhibition p2y12 (Not at [facility])     Status: None   Collection Time: 10/02/16  8:31 AM  Result Value Ref Range   Platelet Function  P2Y12 RESULTS UNAVAILABLE DUE TO INTERFERING SUBSTANCE 194 - 418 PRU    Comment: J CLEVER,RN [id number] [facility]        The literature has shown a direct correlation of PRU values over 230 with higher risks of thrombotic events.  Lower PRU values are associated with platelet inhibition.   Glucose, capillary     Status: Abnormal   Collection Time: 10/02/16 11:30 AM  Result Value Ref Range   Glucose-Capillary 109 (H) 65 - 99 mg/dL   Comment 1 F     No results found.  Review of Systems  Constitutional: Positive for diaphoresis. Negative for chills and fever.  Eyes: Negative for blurred vision and double vision.  Respiratory: Positive for cough and shortness of breath. Negative for wheezing.   Cardiovascular: Positive for chest pain. Negative for orthopnea and claudication.  Gastrointestinal: Positive for nausea and vomiting. Negative for blood in stool.  Genitourinary: Positive for dysuria and frequency.  Musculoskeletal: Positive for joint pain. Negative for myalgias.  Neurological: Negative for focal weakness, seizures and loss of consciousness.  Endo/Heme/Allergies: Bruises/bleeds easily.   Blood pressure 122/63, pulse (!) 51, temperature 98.1 F (36.7 C), temperature source Oral, resp. rate 11, height 5' 7" (1.702  m), weight 129 lb 10.1 oz (58.8 kg), SpO2 97 %. Physical Exam  Vitals reviewed. Constitutional: She is oriented to person, place, and time. She appears well-developed and well-nourished. No distress.  HENT:  Head: Normocephalic and atraumatic.  Mouth/Throat: No oropharyngeal exudate.  Eyes: Conjunctivae and EOM are normal. No scleral icterus.  Neck: No thyromegaly present.  No carotid bruits  Cardiovascular: Normal rate, regular rhythm and normal heart sounds.  Exam reveals no gallop and no friction rub.   No murmur heard. Respiratory: Effort normal and breath sounds normal. No respiratory distress. She has no wheezes. She has no rales.  GI: Soft. She exhibits no distension. There is no tenderness.  Musculoskeletal: She exhibits no edema or deformity.  Lymphadenopathy:    She has no cervical adenopathy.  Neurological: She is alert and oriented to person, place, and time. No cranial nerve deficit.  No focal motor weakness  Skin: Skin is warm and dry.  Psychiatric: She has a normal mood and affect.   CARDIAC CATHETERIZATION Conclusion    Acute inferior ST elevation myocardial infarction due to in-stent restenosis/late stent thrombosis within the mid segment of the right coronary which has 2 layers of previously placed stent.  Successful angioplasty of the mid right coronary segment improving flow from TIMI grade 2  to TIMI grade 3 with resolution of symptoms. Additional stenting was not performed.  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 A large diagonal is jailed.  Severe in-stent restenosis in the circumflex with 90% obstruction and the large first obtuse marginal. Total occlusion of the smaller second obtuse marginal.  Severe inferior wall hypokinesis. Ejection fraction 50-55%. Normal LVEDP.  RECOMMENDATIONS:   TCTS consultation to consider multivessel bypass grafting.  Hold Plavix  Aggrastat for 18 hours  IV  amiodarone to convert atrial fibrillation  IV heparin for atrial fibrillation after Aggrastat discontinued.   I personally reviewed the catheterization films and concur with the findings noted above  Assessment/Plan: 71 yo woman with multiple CRF and long standing CAD presents with a STEMI treated emergently with PCI to reestablish flow. She has residual 3 vessel CAD. EF is 50%. CABG indicated for survival benefit and relief of symptoms.  I discussed the general nature of the procedure, the need for general anesthesia, the use of cardiopulmonary bypass, and the incisions to be used with Ms. Whittenberg and her family. We discussed the expected hospital stay, overall recovery and short and long term outcomes. I reviewed the indications, risks, benefits and alternatives. They understand the risks include, but are not limited to death, stroke, MI, DVT/PE, bleeding, possible need for transfusion, infections, cardiac arrhythmias, as well as other organ system dysfunction including respiratory, renal, or GI complications.  She accepts the risks and agrees to proceed.  She was on plavix PTA. Now stopped. P2Y12 pending. Anticipate surgery on Friday 4/13.  Melrose Nakayama 10/02/2016, 1:55 PM

## 2016-10-06 NOTE — Progress Notes (Signed)
      [address]       [place] [address]             [phone]      s/p CABG x4   Intubated, starting to wake up  BP 119/63   Pulse 80   Temp 98.2 F (36.8 C) (Core (Comment))   Resp 18   Ht '5\' 7"'$  (1.702 m)   Wt 129 lb 10.1 oz (58.8 kg)   SpO2 98%   BMI 20.30 kg/m   Intake/Output Summary (Last 24 hours) at 10/06/16 1717 Last data filed at 10/06/16 1600  Gross per 24 hour  Intake          4504.33 ml  Output             5650 ml  Net         -1145.67 ml   Hct= 20- 2 units PRBC ordered  Doing well early postop. Extubate when she meets parameters  Remo Lipps C. Roxan Hockey, MD [facility] [phone]

## 2016-10-06 NOTE — Procedures (Signed)
Extubation Procedure Note  Patient Details:   Name: [name] DOB: 1945-09-20 MRN: [id number]   Airway Documentation:     Evaluation  O2 sats: stable throughout Complications: No apparent complications Patient did tolerate procedure well. Bilateral Breath Sounds: Clear, Diminished (Simultaneous filing. User may not have seen previous data.)   Yes  Patient tolerated rapid wean. Positive for cuff leak. NIF -20 and VC 0.65 L. Patient extubated to a 3 Lpm nasal cannula. No signs of dyspnea or stridor. Patient achieved 558m three times on the Incentive Spirometer. Resting comfortably at this time. Family and RN at bedside.   SMyrtie Neither4/13/2018, 6:31 PM

## 2016-10-06 NOTE — Brief Op Note (Addendum)
10/01/2016 - 10/06/2016  12:14 PM  PATIENT:  [name]  71 y.o. female  PRE-OPERATIVE DIAGNOSIS:  1. S/p ACUTE MI  2.CAD  POST-OPERATIVE DIAGNOSIS:  1. S/p ACUTE MI  2.CAD  PROCEDURE:  TRANSESOPHAGEAL ECHOCARDIOGRAM (TEE), MEDIAN STERNOTOMY for  CORONARY ARTERY BYPASS GRAFTING (CABG) x 4 (LIMA to LAD, SVG to DIAGONAL, SVG to OM, SVG to PDA)  using internal mammary, and bilateral greater saphenous veins harvested endoscopically and open LLE  SURGEON:  Surgeon(s) and Role:    * Melrose Nakayama, MD - Primary  PHYSICIAN ASSISTANT: Lars Pinks PA-C  ANESTHESIA:   general  EBL:  Total I/O In: 1880 [I.V.:1400; Blood:230; IV Piggyback:250] Out: 5953 [Urine:1040]  DRAINS: Chest tubes placed in the mediastinal and pleural spaces   COUNTS CORRECT:  YES  DICTATION: .Dragon Dictation  PLAN OF CARE: Admit to inpatient   PATIENT DISPOSITION:  ICU - intubated and hemodynamically stable.   Delay start of Pharmacological VTE agent (>24hrs) due to surgical blood loss or risk of bleeding: yes  BASELINE WEIGHT: 59 kg

## 2016-10-06 NOTE — Anesthesia Preprocedure Evaluation (Signed)
Anesthesia Evaluation  Patient identified by MRN, date of birth, ID band Patient awake    Reviewed: Allergy & Precautions, NPO status , Patient's Chart, lab work & pertinent test results  Airway Mallampati: II  TM Distance: >3 FB Neck ROM: Full    Dental no notable dental hx.    Pulmonary neg pulmonary ROS, Current Smoker,    Pulmonary exam normal breath sounds clear to auscultation       Cardiovascular hypertension, + CAD, + Past MI and +CHF  Normal cardiovascular exam Rhythm:Regular Rate:Normal   Severe in-stent restenosis in the circumflex with 90% obstruction and the large first obtuse marginal. Total occlusion of the smaller second obtuse marginal.  Severe inferior wall hypokinesis. Ejection fraction 50-55%. Normal LVEDP.    Neuro/Psych negative neurological ROS  negative psych ROS   GI/Hepatic negative GI ROS, Neg liver ROS,   Endo/Other  diabetes  Renal/GU negative Renal ROS  negative genitourinary   Musculoskeletal negative musculoskeletal ROS (+)   Abdominal   Peds negative pediatric ROS (+)  Hematology negative hematology ROS (+)   Anesthesia Other Findings   Reproductive/Obstetrics negative OB ROS                             Anesthesia Physical Anesthesia Plan  ASA: IV  Anesthesia Plan: General   Post-op Pain Management:    Induction: Intravenous  Airway Management Planned: Oral ETT  Additional Equipment: Arterial line, CVP, PA Cath, Ultrasound Guidance Line Placement and TEE  Intra-op Plan:   Post-operative Plan: Post-operative intubation/ventilation  Informed Consent: I have reviewed the patients History and Physical, chart, labs and discussed the procedure including the risks, benefits and alternatives for the proposed anesthesia with the patient or authorized representative who has indicated his/her understanding and acceptance.   Dental advisory  given  Plan Discussed with: CRNA and Surgeon  Anesthesia Plan Comments:         Anesthesia Quick Evaluation

## 2016-10-06 NOTE — OR Nursing (Signed)
11:55 - 1st call to SICU 12:25 - 2nd call to SICU

## 2016-10-06 NOTE — Anesthesia Procedure Notes (Signed)
Central Venous Catheter Insertion Performed by: Albertha Ghee, anesthesiologist Start/End4/13/2018 6:33 AM, 10/06/2016 6:42 AM Patient location: Pre-op. Preanesthetic checklist: patient identified, IV checked, site marked, risks and benefits discussed, surgical consent, monitors and equipment checked, pre-op evaluation, timeout performed and anesthesia consent Position: Trendelenburg Lidocaine 1% used for infiltration and patient sedated Hand hygiene performed , maximum sterile barriers used  and Seldinger technique used Catheter size: 8.5 Fr Central line and PA cath was placed.Sheath introducer Swan type:thermodilation Procedure performed using ultrasound guided technique. Ultrasound Notes:anatomy identified, needle tip was noted to be adjacent to the nerve/plexus identified, no ultrasound evidence of intravascular and/or intraneural injection and image(s) printed for medical record Attempts: 1 Following insertion, line sutured, dressing applied and Biopatch. Post procedure assessment: blood return through all ports, free fluid flow and no air  Patient tolerated the procedure well with no immediate complications.

## 2016-10-06 NOTE — Progress Notes (Signed)
  Echocardiogram Echocardiogram Transesophageal has been performed.  Johny Chess 10/06/2016, 8:43 AM

## 2016-10-06 NOTE — Progress Notes (Signed)
Patient unable to lift head off bed at this time. NIF -8 at best and VC 0.65L. MD aware. Will let patient wake up more and reattempt.

## 2016-10-06 NOTE — Progress Notes (Signed)
Dr. Roxan Hockey made aware of pt's post CPAP ABG, NIF, and VC.  Orders received.  Will continue to monitor closely.

## 2016-10-06 NOTE — Anesthesia Procedure Notes (Signed)
Procedure Name: Intubation Date/Time: 10/06/2016 7:49 AM Performed by: Everlean Cherry A Pre-anesthesia Checklist: Patient identified, Emergency Drugs available, Suction available and Patient being monitored Patient Re-evaluated:Patient Re-evaluated prior to inductionOxygen Delivery Method: Circle system utilized Preoxygenation: Pre-oxygenation with 100% oxygen Intubation Type: IV induction Ventilation: Mask ventilation without difficulty and Oral airway inserted - appropriate to patient size Grade View: Grade I Tube type: Oral Tube size: 7.5 mm Number of attempts: 1 Airway Equipment and Method: Stylet Placement Confirmation: ETT inserted through vocal cords under direct vision,  positive ETCO2 and breath sounds checked- equal and bilateral Secured at: 23 cm Tube secured with: Tape Dental Injury: Teeth and Oropharynx as per pre-operative assessment

## 2016-10-06 NOTE — Interval H&P Note (Signed)
History and Physical Interval Note:  10/06/2016 7:26 AM  [name]  has presented today for surgery, with the diagnosis of CAD  The various methods of treatment have been discussed with the patient and family. After consideration of risks, benefits and other options for treatment, the patient has consented to  Procedure(s): CORONARY ARTERY BYPASS GRAFTING (CABG) (N/A) TRANSESOPHAGEAL ECHOCARDIOGRAM (TEE) (N/A) as a surgical intervention .  The patient's history has been reviewed, patient examined, no change in status, stable for surgery.  I have reviewed the patient's chart and labs.  Questions were answered to the patient's satisfaction.     Melrose Nakayama

## 2016-10-06 NOTE — Anesthesia Postprocedure Evaluation (Signed)
Anesthesia Post Note  Patient: [name]  Procedure(s) Performed: Procedure(s) (LRB): CORONARY ARTERY BYPASS GRAFTING (CABG) x four, using internal mammary, and bilateral saphenous veins harvested endoscopically (N/A) TRANSESOPHAGEAL ECHOCARDIOGRAM (TEE) (N/A)  Patient location during evaluation: SICU Anesthesia Type: General Level of consciousness: sedated Pain management: pain level controlled Vital Signs Assessment: post-procedure vital signs reviewed and stable Respiratory status: patient remains intubated per anesthesia plan Cardiovascular status: stable Anesthetic complications: no       Last Vitals:  Vitals:   10/06/16 1620 10/06/16 1700  BP:  (!) 126/97  Pulse: 80 80  Resp: 18 (!) 26  Temp:  36.2 C    Last Pain:  Vitals:   10/06/16 1600  TempSrc: Core (Comment)  PainSc: 0-No pain                 Tosca Pletz S

## 2016-10-06 NOTE — Progress Notes (Signed)
Dr. Roxan Hockey notified of pts H/H and plt counts.  Orders received.  Will continue to monitor closely.

## 2016-10-06 NOTE — Transfer of Care (Signed)
Immediate Anesthesia Transfer of Care Note  Patient: [name]  Procedure(s) Performed: Procedure(s): CORONARY ARTERY BYPASS GRAFTING (CABG) x four, using internal mammary, and bilateral saphenous veins harvested endoscopically (N/A) TRANSESOPHAGEAL ECHOCARDIOGRAM (TEE) (N/A)  Patient Location: ICU  Anesthesia Type:General  Level of Consciousness: sedated, unresponsive and Patient remains intubated per anesthesia plan  Airway & Oxygen Therapy: Patient remains intubated per anesthesia plan and Patient placed on Ventilator (see vital sign flow sheet for setting)  Post-op Assessment: Report given to RN and Post -op Vital signs reviewed and stable  Post vital signs: Reviewed and stable  Last Vitals:  Vitals:   10/06/16 0357 10/06/16 0400  BP:  119/66  Pulse:    Resp:  14  Temp: 36.8 C     Last Pain:  Vitals:   10/06/16 0400  TempSrc:   PainSc: 0-No pain         Complications: No apparent anesthesia complications

## 2016-10-07 ENCOUNTER — Inpatient Hospital Stay (HOSPITAL_COMMUNITY): Payer: Medicare Other

## 2016-10-07 DIAGNOSIS — I2119 ST elevation (STEMI) myocardial infarction involving other coronary artery of inferior wall: Secondary | ICD-10-CM

## 2016-10-07 LAB — TYPE AND SCREEN
ABO/RH(D): O POS
Antibody Screen: NEGATIVE
UNIT DIVISION: 0
Unit division: 0

## 2016-10-07 LAB — BPAM RBC
BLOOD PRODUCT EXPIRATION DATE: 201805092359
Blood Product Expiration Date: 201805092359
ISSUE DATE / TIME: 201804131427
ISSUE DATE / TIME: 201804131517
UNIT TYPE AND RH: 5100
UNIT TYPE AND RH: 5100

## 2016-10-07 LAB — CBC
HCT: 27.8 % — ABNORMAL LOW (ref 36.0–46.0)
HEMATOCRIT: 28 % — AB (ref 36.0–46.0)
HEMATOCRIT: 26.9 % — AB (ref 36.0–46.0)
HEMOGLOBIN: 9.3 g/dL — AB (ref 12.0–15.0)
Hemoglobin: 9.4 g/dL — ABNORMAL LOW (ref 12.0–15.0)
Hemoglobin: 9.2 g/dL — ABNORMAL LOW (ref 12.0–15.0)
MCH: 28.9 pg (ref 26.0–34.0)
MCH: 28.6 pg (ref 26.0–34.0)
MCH: 30 pg (ref 26.0–34.0)
MCHC: 33.6 g/dL (ref 30.0–36.0)
MCHC: 33.1 g/dL (ref 30.0–36.0)
MCHC: 34.6 g/dL (ref 30.0–36.0)
MCV: 86.2 fL (ref 78.0–100.0)
MCV: 86.3 fL (ref 78.0–100.0)
MCV: 86.8 fL (ref 78.0–100.0)
PLATELETS: 117 10*3/uL — AB (ref 150–400)
PLATELETS: 119 10*3/uL — AB (ref 150–400)
Platelets: 111 10*3/uL — ABNORMAL LOW (ref 150–400)
RBC: 3.25 MIL/uL — ABNORMAL LOW (ref 3.87–5.11)
RBC: 3.22 MIL/uL — AB (ref 3.87–5.11)
RBC: 3.1 MIL/uL — AB (ref 3.87–5.11)
RDW: 15 % (ref 11.5–15.5)
RDW: 15 % (ref 11.5–15.5)
RDW: 15.5 % (ref 11.5–15.5)
WBC: 9.2 10*3/uL (ref 4.0–10.5)
WBC: 9.5 10*3/uL (ref 4.0–10.5)
WBC: 10.2 10*3/uL (ref 4.0–10.5)

## 2016-10-07 LAB — BASIC METABOLIC PANEL
Anion gap: 9 (ref 5–15)
BUN: 12 mg/dL (ref 6–20)
CALCIUM: 8.1 mg/dL — AB (ref 8.9–10.3)
CO2: 24 mmol/L (ref 22–32)
CREATININE: 0.8 mg/dL (ref 0.44–1.00)
Chloride: 105 mmol/L (ref 101–111)
Glucose, Bld: 126 mg/dL — ABNORMAL HIGH (ref 65–99)
Potassium: 4.1 mmol/L (ref 3.5–5.1)
Sodium: 138 mmol/L (ref 135–145)

## 2016-10-07 LAB — GLUCOSE, CAPILLARY
GLUCOSE-CAPILLARY: 100 mg/dL — AB (ref 65–99)
GLUCOSE-CAPILLARY: 103 mg/dL — AB (ref 65–99)
GLUCOSE-CAPILLARY: 106 mg/dL — AB (ref 65–99)
GLUCOSE-CAPILLARY: 107 mg/dL — AB (ref 65–99)
GLUCOSE-CAPILLARY: 108 mg/dL — AB (ref 65–99)
GLUCOSE-CAPILLARY: 118 mg/dL — AB (ref 65–99)
GLUCOSE-CAPILLARY: 128 mg/dL — AB (ref 65–99)
Glucose-Capillary: 105 mg/dL — ABNORMAL HIGH (ref 65–99)
Glucose-Capillary: 107 mg/dL — ABNORMAL HIGH (ref 65–99)
Glucose-Capillary: 155 mg/dL — ABNORMAL HIGH (ref 65–99)

## 2016-10-07 LAB — POCT I-STAT, CHEM 8
BUN: 17 mg/dL (ref 6–20)
CALCIUM ION: 1.21 mmol/L (ref 1.15–1.40)
CREATININE: 0.9 mg/dL (ref 0.44–1.00)
Chloride: 98 mmol/L — ABNORMAL LOW (ref 101–111)
GLUCOSE: 151 mg/dL — AB (ref 65–99)
HEMATOCRIT: 26 % — AB (ref 36.0–46.0)
HEMOGLOBIN: 8.8 g/dL — AB (ref 12.0–15.0)
Potassium: 4.1 mmol/L (ref 3.5–5.1)
Sodium: 138 mmol/L (ref 135–145)
TCO2: 26 mmol/L (ref 0–100)

## 2016-10-07 LAB — CREATININE, SERUM
Creatinine, Ser: 0.78 mg/dL (ref 0.44–1.00)
Creatinine, Ser: 0.86 mg/dL (ref 0.44–1.00)

## 2016-10-07 LAB — MAGNESIUM
MAGNESIUM: 2.4 mg/dL (ref 1.7–2.4)
Magnesium: 2.5 mg/dL — ABNORMAL HIGH (ref 1.7–2.4)

## 2016-10-07 MED ORDER — HYDROXYCHLOROQUINE SULFATE 200 MG PO TABS: 200.0000 mg | ORAL_TABLET | Freq: Every day | ORAL | Status: DC | PRN

## 2016-10-07 MED ORDER — ENOXAPARIN SODIUM 40 MG/0.4ML ~~LOC~~ SOLN
40.0000 mg | Freq: Every day | SUBCUTANEOUS | Status: DC
  Administered 2016-10-07 – 2016-10-11 (×5): 40 mg via SUBCUTANEOUS
  Filled 2016-10-07 (×5): qty 0.4

## 2016-10-07 MED ORDER — PREDNISONE 10 MG PO TABS
10.0000 mg | ORAL_TABLET | Freq: Every day | ORAL | Status: DC
  Administered 2016-10-07 – 2016-10-12 (×6): 10 mg via ORAL
  Filled 2016-10-07 (×6): qty 1

## 2016-10-07 MED ORDER — INSULIN DETEMIR 100 UNIT/ML ~~LOC~~ SOLN
25.0000 [IU] | Freq: Two times a day (BID) | SUBCUTANEOUS | Status: DC
  Filled 2016-10-07: qty 0.25

## 2016-10-07 MED ORDER — INSULIN ASPART 100 UNIT/ML ~~LOC~~ SOLN
0.0000 [IU] | SUBCUTANEOUS | Status: DC
  Administered 2016-10-07 (×2): 2 [IU] via SUBCUTANEOUS

## 2016-10-07 MED ORDER — INSULIN DETEMIR 100 UNIT/ML ~~LOC~~ SOLN
25.0000 [IU] | Freq: Two times a day (BID) | SUBCUTANEOUS | Status: DC
  Administered 2016-10-07 (×2): 25 [IU] via SUBCUTANEOUS
  Filled 2016-10-07 (×3): qty 0.25

## 2016-10-07 MED ORDER — INSULIN ASPART 100 UNIT/ML ~~LOC~~ SOLN
3.0000 [IU] | Freq: Three times a day (TID) | SUBCUTANEOUS | Status: DC
  Administered 2016-10-07 – 2016-10-08 (×4): 3 [IU] via SUBCUTANEOUS

## 2016-10-07 NOTE — Progress Notes (Signed)
      [address]       [place] [address]             [phone]       Sleeping at present. Ambulated around unit earlier  BP 135/65   Pulse 81   Temp 98.9 F (37.2 C) (Oral)   Resp (!) 23   Ht '5\' 7"'$  (1.702 m)   Wt 130 lb 8.2 oz (59.2 kg)   SpO2 (!) 87%   BMI 20.44 kg/m    Intake/Output Summary (Last 24 hours) at 10/07/16 1735 Last data filed at 10/07/16 1621  Gross per 24 hour  Intake           757.44 ml  Output             1905 ml  Net         -1147.56 ml   Creatinine 0.86, Hct= 27  Doing well POD # 1  Hosanna Betley C. Roxan Hockey, MD [facility] [phone]

## 2016-10-07 NOTE — Progress Notes (Signed)
1 Day Post-Op Procedure(s) (LRB): CORONARY ARTERY BYPASS GRAFTING (CABG) x four, using internal mammary, and bilateral saphenous veins harvested endoscopically (N/A) TRANSESOPHAGEAL ECHOCARDIOGRAM (TEE) (N/A) Subjective: c/o not being able to turn head b/c of swan c/o nausea when she talks  Objective: Vital signs in last 24 hours: Temp:  [95.7 F (35.4 C)-99.1 F (37.3 C)] 99 F (37.2 C) (04/14 0800) Pulse Rate:  [78-81] 80 (04/14 0800) Cardiac Rhythm: Atrial paced (04/14 0800) Resp:  [10-26] 15 (04/14 0800) BP: (91-149)/(49-97) 112/63 (04/14 0800) SpO2:  [97 %-100 %] 100 % (04/14 0800) Arterial Line BP: (103-177)/(44-78) 124/52 (04/14 0800) FiO2 (%):  [40 %-50 %] 40 % (04/13 1620) Weight:  [130 lb 8.2 oz (59.2 kg)] 130 lb 8.2 oz (59.2 kg) (04/14 0400)  Hemodynamic parameters for last 24 hours: PAP: (18-37)/(6-23) 25/10 CO:  [3.5 L/min-5.3 L/min] 3.9 L/min CI:  [2.1 L/min/m2-5.6 L/min/m2] 2.3 L/min/m2  Intake/Output from previous day: 04/13 0701 - 04/14 0700 In: 5173 [I.V.:2324.2; Blood:873.8; IV Piggyback:1975] Out: 3382 [Urine:4425; Blood:1625; Chest Tube:420] Intake/Output this shift: Total I/O In: 23.6 [I.V.:23.6] Out: 75 [Urine:75]  General appearance: alert, cooperative and no distress Neurologic: intact Heart: regular rate and rhythm Lungs: diminished breath sounds bibasilar Abdomen: normal findings: soft, non-tender  Lab Results:  Recent Labs  10/06/16 1728 10/06/16 1914 10/07/16 0454  WBC 8.3  --  9.2  HGB 9.7* 9.2* 9.4*  HCT 29.5* 27.0* 28.0*  PLT 98*  --  117*   BMET:  Recent Labs  10/06/16 0242  10/06/16 1914 10/07/16 0454  NA 139  < > 141 138  K 3.8  < > 4.2 4.1  CL 106  < > 104 105  CO2 25  --   --  24  GLUCOSE 128*  < > 179* 126*  BUN 17  < > 13 12  CREATININE 1.19*  < > 0.80 0.80  CALCIUM 9.3  --   --  8.1*  < > = values in this interval not displayed.  PT/INR:  Recent Labs  10/06/16 1300  LABPROT 19.0*  INR 1.58   ABG     Component Value Date/Time   PHART 7.343 (L) 10/06/2016 1918   HCO3 25.7 10/06/2016 1918   TCO2 27 10/06/2016 1918   ACIDBASEDEF 3.0 (H) 10/06/2016 1645   O2SAT 96.0 10/06/2016 1918   CBG (last 3)   Recent Labs  10/07/16 0203 10/07/16 0413 10/07/16 0652  GLUCAP 103* 128* 105*    Assessment/Plan: S/P Procedure(s) (LRB): CORONARY ARTERY BYPASS GRAFTING (CABG) x four, using internal mammary, and bilateral saphenous veins harvested endoscopically (N/A) TRANSESOPHAGEAL ECHOCARDIOGRAM (TEE) (N/A) -POD # 1  CV- in SR with good hemodynamics, dc swan and arterial line  ASA, beta blocker, statin  RESP- IS for atelectasis  RENAL- creatinine and lytes oK  ENDO- CBG controlled with insulin drip, transition to levemir + SSI  Restart prednisone which she takes for arthritis  SCD + enoxaparin for DVT prophylaxis  Dc chest tubes  Mobilize     LOS: 6 days    [name] 10/07/2016

## 2016-10-08 ENCOUNTER — Inpatient Hospital Stay (HOSPITAL_COMMUNITY): Payer: Medicare Other

## 2016-10-08 LAB — GLUCOSE, CAPILLARY
GLUCOSE-CAPILLARY: 151 mg/dL — AB (ref 65–99)
GLUCOSE-CAPILLARY: 78 mg/dL (ref 65–99)
GLUCOSE-CAPILLARY: 227 mg/dL — AB (ref 65–99)
GLUCOSE-CAPILLARY: 90 mg/dL (ref 65–99)
Glucose-Capillary: 109 mg/dL — ABNORMAL HIGH (ref 65–99)

## 2016-10-08 LAB — CBC
HEMATOCRIT: 25.1 % — AB (ref 36.0–46.0)
Hemoglobin: 8.3 g/dL — ABNORMAL LOW (ref 12.0–15.0)
MCH: 28.9 pg (ref 26.0–34.0)
MCHC: 33.1 g/dL (ref 30.0–36.0)
MCV: 87.5 fL (ref 78.0–100.0)
Platelets: 112 10*3/uL — ABNORMAL LOW (ref 150–400)
RBC: 2.87 MIL/uL — ABNORMAL LOW (ref 3.87–5.11)
RDW: 15.1 % (ref 11.5–15.5)
WBC: 9.9 10*3/uL (ref 4.0–10.5)

## 2016-10-08 LAB — BASIC METABOLIC PANEL
Anion gap: 7 (ref 5–15)
BUN: 16 mg/dL (ref 6–20)
CALCIUM: 8.6 mg/dL — AB (ref 8.9–10.3)
CO2: 27 mmol/L (ref 22–32)
CREATININE: 0.83 mg/dL (ref 0.44–1.00)
Chloride: 105 mmol/L (ref 101–111)
Glucose, Bld: 85 mg/dL (ref 65–99)
Potassium: 3.6 mmol/L (ref 3.5–5.1)
SODIUM: 139 mmol/L (ref 135–145)

## 2016-10-08 MED ORDER — METOPROLOL TARTRATE 25 MG PO TABS
25.0000 mg | ORAL_TABLET | Freq: Two times a day (BID) | ORAL | Status: DC
  Administered 2016-10-08 (×2): 25 mg via ORAL
  Filled 2016-10-08 (×2): qty 1

## 2016-10-08 MED ORDER — INSULIN ASPART 100 UNIT/ML ~~LOC~~ SOLN: 0.0000 [IU] | Freq: Every day | SUBCUTANEOUS | Status: DC

## 2016-10-08 MED ORDER — INSULIN ASPART 100 UNIT/ML ~~LOC~~ SOLN
0.0000 [IU] | Freq: Three times a day (TID) | SUBCUTANEOUS | Status: DC
  Administered 2016-10-08: 5 [IU] via SUBCUTANEOUS
  Administered 2016-10-09: 4 [IU] via SUBCUTANEOUS
  Administered 2016-10-10: 3 [IU] via SUBCUTANEOUS
  Administered 2016-10-10: 7 [IU] via SUBCUTANEOUS
  Administered 2016-10-11: 3 [IU] via SUBCUTANEOUS
  Administered 2016-10-11: 5 [IU] via SUBCUTANEOUS

## 2016-10-08 MED ORDER — INSULIN DETEMIR 100 UNIT/ML ~~LOC~~ SOLN
20.0000 [IU] | Freq: Two times a day (BID) | SUBCUTANEOUS | Status: DC
  Administered 2016-10-08 (×2): 20 [IU] via SUBCUTANEOUS
  Filled 2016-10-08 (×3): qty 0.2

## 2016-10-08 MED ORDER — SODIUM CHLORIDE 0.9 % IV SOLN
30.0000 meq | Freq: Once | INTRAVENOUS | Status: AC
  Administered 2016-10-08: 30 meq via INTRAVENOUS
  Filled 2016-10-08: qty 15

## 2016-10-08 MED ORDER — AMIODARONE LOAD VIA INFUSION
150.0000 mg | Freq: Once | INTRAVENOUS | Status: AC
  Administered 2016-10-08: 150 mg via INTRAVENOUS
  Filled 2016-10-08: qty 83.34

## 2016-10-08 MED ORDER — POTASSIUM CHLORIDE 2 MEQ/ML IV SOLN
30.0000 meq | Freq: Once | INTRAVENOUS | Status: AC
  Administered 2016-10-08: 30 meq via INTRAVENOUS
  Filled 2016-10-08: qty 15

## 2016-10-08 MED ORDER — AMIODARONE HCL IN DEXTROSE 360-4.14 MG/200ML-% IV SOLN
60.0000 mg/h | INTRAVENOUS | Status: AC
  Administered 2016-10-08: 60 mg/h via INTRAVENOUS

## 2016-10-08 MED ORDER — AMIODARONE HCL IN DEXTROSE 360-4.14 MG/200ML-% IV SOLN
30.0000 mg/h | INTRAVENOUS | Status: DC
  Administered 2016-10-08 (×3): 30 mg/h via INTRAVENOUS
  Filled 2016-10-08: qty 200
  Filled 2016-10-08: qty 400

## 2016-10-08 NOTE — Progress Notes (Signed)
2 Days Post-Op Procedure(s) (LRB): CORONARY ARTERY BYPASS GRAFTING (CABG) x four, using internal mammary, and bilateral saphenous veins harvested endoscopically (N/A) TRANSESOPHAGEAL ECHOCARDIOGRAM (TEE) (N/A) Subjective: No complaints but went into atrial fib with RVR (120s) this AM  Objective: Vital signs in last 24 hours: Temp:  [98.5 F (36.9 C)-99 F (37.2 C)] 98.5 F (36.9 C) (04/15 0700) Pulse Rate:  [79-111] 111 (04/15 0816) Cardiac Rhythm: Atrial fibrillation (04/15 0816) Resp:  [10-25] 13 (04/15 0816) BP: (89-135)/(51-73) 122/62 (04/15 0800) SpO2:  [87 %-100 %] 97 % (04/15 0816) Arterial Line BP: (145)/(59) 145/59 (04/14 0900) Weight:  [134 lb 11.2 oz (61.1 kg)] 134 lb 11.2 oz (61.1 kg) (04/15 0600)  Hemodynamic parameters for last 24 hours: PAP: (27)/(11) 27/11  Intake/Output from previous day: 04/14 0701 - 04/15 0700 In: 553.3 [P.O.:220; I.V.:283.3; IV Piggyback:50] Out: 950 [Urine:890; Chest Tube:60] Intake/Output this shift: No intake/output data recorded.  General appearance: alert, cooperative and no distress Neurologic: intact Heart: irregularly irregular rhythm Lungs: diminished breath sounds bibasilar Abdomen: normal findings: soft, non-tender  Lab Results:  Recent Labs  10/07/16 1611 10/07/16 1622 10/08/16 0344  WBC 10.2  --  9.9  HGB 9.3* 8.8* 8.3*  HCT 26.9* 26.0* 25.1*  PLT 111*  --  112*   BMET:  Recent Labs  10/07/16 0454  10/07/16 1622 10/08/16 0344  NA 138  --  138 139  K 4.1  --  4.1 3.6  CL 105  --  98* 105  CO2 24  --   --  27  GLUCOSE 126*  --  151* 85  BUN 12  --  17 16  CREATININE 0.80  < > 0.90 0.83  CALCIUM 8.1*  --   --  8.6*  < > = values in this interval not displayed.  PT/INR:  Recent Labs  10/06/16 1300  LABPROT 19.0*  INR 1.58   ABG    Component Value Date/Time   PHART 7.343 (L) 10/06/2016 1918   HCO3 25.7 10/06/2016 1918   TCO2 26 10/07/2016 1622   ACIDBASEDEF 3.0 (H) 10/06/2016 1645   O2SAT 96.0  10/06/2016 1918   CBG (last 3)   Recent Labs  10/07/16 1935 10/07/16 2302 10/08/16 0717  GLUCAP 155* 100* 47    Assessment/Plan: S/P Procedure(s) (LRB): CORONARY ARTERY BYPASS GRAFTING (CABG) x four, using internal mammary, and bilateral saphenous veins harvested endoscopically (N/A) TRANSESOPHAGEAL ECHOCARDIOGRAM (TEE) (N/A) -  CV- atrial fib- increase lopressor, start amiodarone  RESP- continue IS for atelectasis  RENAL- creatinine Ok, supplement K  ENDO- CBG well controlled- change to AC/HS  SCD + enoxaparin for DVT prophylaxis  Continue cardiac rehab   LOS: 7 days    [name] 10/08/2016

## 2016-10-08 NOTE — Progress Notes (Signed)
  Amiodarone Drug - Drug Interaction Consult Note  Recommendations: No changes needed, monitor for now  Amiodarone is metabolized by the cytochrome P450 system and therefore has the potential to cause many drug interactions. Amiodarone has an average plasma half-life of 50 days (range 20 to 100 days).   There is potential for drug interactions to occur several weeks or months after stopping treatment and the onset of drug interactions may be slow after initiating amiodarone.   '[x]'$  Statins: Increased risk of myopathy. Simvastatin- restrict dose to '20mg'$  daily. Other statins: counsel patients to report any muscle pain or weakness immediately.  '[]'$  Anticoagulants: Amiodarone can increase anticoagulant effect. Consider warfarin dose reduction. Patients should be monitored closely and the dose of anticoagulant altered accordingly, remembering that amiodarone levels take several weeks to stabilize.  '[]'$  Antiepileptics: Amiodarone can increase plasma concentration of phenytoin, the dose should be reduced. Note that small changes in phenytoin dose can result in large changes in levels. Monitor patient and counsel on signs of toxicity.  '[x]'$  Beta blockers: increased risk of bradycardia, AV block and myocardial depression. Sotalol - avoid concomitant use.  '[]'$   Calcium channel blockers (diltiazem and verapamil): increased risk of bradycardia, AV block and myocardial depression.  '[]'$   Cyclosporine: Amiodarone increases levels of cyclosporine. Reduced dose of cyclosporine is recommended.  '[]'$  Digoxin dose should be halved when amiodarone is started.  '[]'$  Diuretics: increased risk of cardiotoxicity if hypokalemia occurs.  '[]'$  Oral hypoglycemic agents (glyburide, glipizide, glimepiride): increased risk of hypoglycemia. Patient's glucose levels should be monitored closely when initiating amiodarone therapy.   '[]'$  Drugs that prolong the QT interval:  Torsades de pointes risk may be increased with concurrent use -  avoid if possible.  Monitor QTc, also keep magnesium/potassium WNL if concurrent therapy can't be avoided. Marland Kitchen Antibiotics: e.g. fluoroquinolones, erythromycin. . Antiarrhythmics: e.g. quinidine, procainamide, disopyramide, sotalol. . Antipsychotics: e.g. phenothiazines, haloperidol.  . Lithium, tricyclic antidepressants, and methadone. Thank You,  [name]  10/08/2016 9:21 AM

## 2016-10-08 NOTE — Progress Notes (Signed)
      [address]       [place], [address]             [phone]      Resting comfortably  Converted back to SR  BP 105/60 (BP Location: Right Arm)   Pulse 80   Temp 98.9 F (37.2 C) (Oral)   Resp 13   Ht '5\' 7"'$  (1.702 m)   Wt 134 lb 11.2 oz (61.1 kg)   SpO2 99%   BMI 21.10 kg/m   Intake/Output Summary (Last 24 hours) at 10/08/16 1656 Last data filed at 10/08/16 1600  Gross per 24 hour  Intake          1063.96 ml  Output              830 ml  Net           233.96 ml   Remo Lipps C. Roxan Hockey, MD [facility] [phone]

## 2016-10-09 ENCOUNTER — Encounter (HOSPITAL_COMMUNITY): Payer: Self-pay | Admitting: *Deleted

## 2016-10-09 ENCOUNTER — Inpatient Hospital Stay (HOSPITAL_COMMUNITY): Payer: Medicare Other

## 2016-10-09 LAB — BASIC METABOLIC PANEL
Anion gap: 8 (ref 5–15)
BUN: 17 mg/dL (ref 6–20)
CALCIUM: 8.4 mg/dL — AB (ref 8.9–10.3)
CO2: 28 mmol/L (ref 22–32)
CREATININE: 0.89 mg/dL (ref 0.44–1.00)
Chloride: 103 mmol/L (ref 101–111)
GFR calc non Af Amer: >60 mL/min (ref >60)
GLUCOSE: 68 mg/dL (ref 65–99)
Potassium: 3.9 mmol/L (ref 3.5–5.1)
Sodium: 139 mmol/L (ref 135–145)

## 2016-10-09 LAB — GLUCOSE, CAPILLARY
GLUCOSE-CAPILLARY: 155 mg/dL — AB (ref 65–99)
Glucose-Capillary: 73 mg/dL (ref 65–99)
Glucose-Capillary: 170 mg/dL — ABNORMAL HIGH (ref 65–99)
Glucose-Capillary: 102 mg/dL — ABNORMAL HIGH (ref 65–99)

## 2016-10-09 LAB — CBC
HEMATOCRIT: 23.9 % — AB (ref 36.0–46.0)
HEMOGLOBIN: 8 g/dL — AB (ref 12.0–15.0)
MCH: 29.5 pg (ref 26.0–34.0)
MCHC: 33.5 g/dL (ref 30.0–36.0)
MCV: 88.2 fL (ref 78.0–100.0)
Platelets: 120 10*3/uL — ABNORMAL LOW (ref 150–400)
RBC: 2.71 MIL/uL — ABNORMAL LOW (ref 3.87–5.11)
RDW: 15.2 % (ref 11.5–15.5)
WBC: 10.8 10*3/uL — ABNORMAL HIGH (ref 4.0–10.5)

## 2016-10-09 LAB — POCT I-STAT 4, (NA,K, GLUC, HGB,HCT)
GLUCOSE: 119 mg/dL — AB (ref 65–99)
HEMATOCRIT: 20 % — AB (ref 36.0–46.0)
Hemoglobin: 6.8 g/dL — CL (ref 12.0–15.0)
Potassium: 4 mmol/L (ref 3.5–5.1)
SODIUM: 142 mmol/L (ref 135–145)

## 2016-10-09 MED ORDER — METOPROLOL TARTRATE 12.5 MG HALF TABLET
12.5000 mg | ORAL_TABLET | Freq: Two times a day (BID) | ORAL | Status: DC
  Administered 2016-10-09 – 2016-10-10 (×3): 12.5 mg via ORAL
  Filled 2016-10-09 (×3): qty 1

## 2016-10-09 MED ORDER — ENSURE ENLIVE PO LIQD
237.0000 mL | Freq: Two times a day (BID) | ORAL | Status: DC
  Administered 2016-10-11 – 2016-10-12 (×3): 237 mL via ORAL

## 2016-10-09 MED ORDER — POTASSIUM CHLORIDE CRYS ER 20 MEQ PO TBCR
20.0000 meq | EXTENDED_RELEASE_TABLET | Freq: Two times a day (BID) | ORAL | Status: DC
  Administered 2016-10-09 – 2016-10-12 (×7): 20 meq via ORAL
  Filled 2016-10-09 (×7): qty 1

## 2016-10-09 MED ORDER — INSULIN ASPART 100 UNIT/ML ~~LOC~~ SOLN
5.0000 [IU] | Freq: Three times a day (TID) | SUBCUTANEOUS | Status: DC
  Administered 2016-10-09 – 2016-10-12 (×9): 5 [IU] via SUBCUTANEOUS

## 2016-10-09 MED ORDER — MOVING RIGHT ALONG BOOK
Freq: Once | Status: AC
  Administered 2016-10-09: 08:00:00
  Filled 2016-10-09: qty 1

## 2016-10-09 MED ORDER — SODIUM CHLORIDE 0.9 % IV SOLN: 250.0000 mL | INTRAVENOUS | Status: DC | PRN

## 2016-10-09 MED ORDER — INSULIN DETEMIR 100 UNIT/ML ~~LOC~~ SOLN
20.0000 [IU] | Freq: Every day | SUBCUTANEOUS | Status: DC
  Administered 2016-10-09 – 2016-10-12 (×4): 20 [IU] via SUBCUTANEOUS
  Filled 2016-10-09 (×4): qty 0.2

## 2016-10-09 MED ORDER — AMIODARONE HCL 200 MG PO TABS
400.0000 mg | ORAL_TABLET | Freq: Two times a day (BID) | ORAL | Status: DC
  Administered 2016-10-09 – 2016-10-12 (×7): 400 mg via ORAL
  Filled 2016-10-09 (×7): qty 2

## 2016-10-09 MED ORDER — ALUM & MAG HYDROXIDE-SIMETH 200-200-20 MG/5ML PO SUSP: 15.0000 mL | ORAL | Status: DC | PRN

## 2016-10-09 MED ORDER — MAGNESIUM HYDROXIDE 400 MG/5ML PO SUSP: 30.0000 mL | Freq: Every day | ORAL | Status: DC | PRN

## 2016-10-09 MED ORDER — SODIUM CHLORIDE 0.9% FLUSH
3.0000 mL | Freq: Two times a day (BID) | INTRAVENOUS | Status: DC
  Administered 2016-10-09 – 2016-10-12 (×6): 3 mL via INTRAVENOUS

## 2016-10-09 MED ORDER — SODIUM CHLORIDE 0.9% FLUSH: 3.0000 mL | INTRAVENOUS | Status: DC | PRN

## 2016-10-09 MED ORDER — FUROSEMIDE 40 MG PO TABS
40.0000 mg | ORAL_TABLET | Freq: Every day | ORAL | Status: DC
  Administered 2016-10-09 – 2016-10-12 (×4): 40 mg via ORAL
  Filled 2016-10-09 (×4): qty 1

## 2016-10-09 NOTE — Plan of Care (Signed)
Transferred to 2W 20 via monitor and WC. RN to receive in room

## 2016-10-09 NOTE — Progress Notes (Signed)
Pt received from ICU RN. Pt oriented to room and equipment. VSS. Telemetry applied, CCMD notified x2. Pt comfortable in chair, denies needs. Call bell within reach, will continue to monitor.   Fritz Pickerel, RN

## 2016-10-09 NOTE — Plan of Care (Signed)
Report to 2 W RN 

## 2016-10-09 NOTE — Progress Notes (Signed)
3 Days Post-Op Procedure(s) (LRB): CORONARY ARTERY BYPASS GRAFTING (CABG) x four, using internal mammary, and bilateral saphenous veins harvested endoscopically (N/A) TRANSESOPHAGEAL ECHOCARDIOGRAM (TEE) (N/A) Subjective: No complaints this AM  Objective: Vital signs in last 24 hours: Temp:  [97.5 F (36.4 C)-99.3 F (37.4 C)] 97.5 F (36.4 C) (04/16 0520) Pulse Rate:  [79-128] 80 (04/16 0700) Cardiac Rhythm: Atrial paced (04/16 0200) Resp:  [12-27] 19 (04/16 0700) BP: (88-167)/(57-142) 106/57 (04/16 0700) SpO2:  [85 %-100 %] 92 % (04/16 0700) Weight:  [135 lb 2.3 oz (61.3 kg)] 135 lb 2.3 oz (61.3 kg) (04/16 0520)  Hemodynamic parameters for last 24 hours:    Intake/Output from previous day: 04/15 0701 - 04/16 0700 In: 1537.5 [P.O.:300; I.V.:707.5; IV Piggyback:530] Out: 975 [Urine:975] Intake/Output this shift: No intake/output data recorded.  General appearance: alert, cooperative and no distress Neurologic: intact Heart: regular rate and rhythm Lungs: diminished breath sounds bibasilar Abdomen: normal findings: soft, non-tender  Lab Results:  Recent Labs  10/08/16 0344 10/09/16 0522  WBC 9.9 10.8*  HGB 8.3* 8.0*  HCT 25.1* 23.9*  PLT 112* 120*   BMET:  Recent Labs  10/08/16 0344 10/09/16 0522  NA 139 139  K 3.6 3.9  CL 105 103  CO2 27 28  GLUCOSE 85 68  BUN 16 17  CREATININE 0.83 0.89  CALCIUM 8.6* 8.4*    PT/INR:  Recent Labs  10/06/16 1300  LABPROT 19.0*  INR 1.58   ABG    Component Value Date/Time   PHART 7.343 (L) 10/06/2016 1918   HCO3 25.7 10/06/2016 1918   TCO2 26 10/07/2016 1622   ACIDBASEDEF 3.0 (H) 10/06/2016 1645   O2SAT 96.0 10/06/2016 1918   CBG (last 3)   Recent Labs  10/08/16 1128 10/08/16 1615 10/08/16 2111  GLUCAP 227* 109* 90    Assessment/Plan: S/P Procedure(s) (LRB): CORONARY ARTERY BYPASS GRAFTING (CABG) x four, using internal mammary, and bilateral saphenous veins harvested endoscopically  (N/A) TRANSESOPHAGEAL ECHOCARDIOGRAM (TEE) (N/A) -CV- converted to SR with IV amiodarone- change to PO  Sinus brady- decrease lopressor  RESP- continue IS  RENAL- creatinine and lytes Ok, will start PO lasix  ENDO- CBG elevated after meals and low over night- decrease levemir and increase meal coverage  Continue cardiac rehab   LOS: 8 days    Melrose Nakayama 10/09/2016

## 2016-10-09 NOTE — Progress Notes (Signed)
Initial Nutrition Assessment  DOCUMENTATION CODES:   Non-severe (moderate) malnutrition in context of chronic illness  INTERVENTION:   Ensure Enlive po BID, each supplement provides 350 kcal and 20 grams of protein  NUTRITION DIAGNOSIS:   Malnutrition related to chronic illness, heart disease s/p CABG as evidenced by moderate depletion of body fat, moderate depletions of muscle mass.  GOAL:   Patient will meet greater than or equal to 90% of their needs  MONITOR:   PO intake, Supplement acceptance, Labs, Weight trends  REASON FOR ASSESSMENT:   Malnutrition Screening Tool    ASSESSMENT:   71 year old female with extensive prior history of coronary disease that includes prior stenting of all 3 major coronary vessels (RCA 2 overlapping; LAD; and circumflex), diabetic with history of hypertension and hyperlipidemia presented with inferior STEMI 10/01/16. 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   Met with pt in room today. Pt reports good appetite pta and reports that she eats one meal per day which is dinner. Pt reports that her weight is stable. RD discussed with pt the importance of adequate protein intake to preserve lean muscle mass and recommended that pt drink supplement if she is going to skip meals. Pt would like to try Ensure.   Medications reviewed and include: aspirin, dulcolax, colace, lovenox, folic acid, insulin, KCl, prednisone, protonix  Labs reviewed: Ca 8.4(L) Wbc- 10.8(H), Hgb 8.0(L), Hct 23.9(L) cbgs- 151, 85, 68 x 48hrs AIC 7.0(H) 4/8  Nutrition-Focused physical exam completed. Findings are moderate fat depletion, moderate muscle depletion, and mild edema.   Diet Order:  Diet heart healthy/carb modified Room service appropriate? Yes; Fluid consistency: Thin  Skin:  Wound (see comment) (incision chest, left and right legs)  Last BM:  4/9  Height:   Ht Readings from Last 1 Encounters:  10/01/16 '5\' 7"'$  (1.702 m)    Weight:   Wt Readings from Last 1 Encounters:   10/09/16 135 lb 2.3 oz (61.3 kg)    Ideal Body Weight:  61.36 kg  BMI:  Body mass index is 21.17 kg/m.  Estimated Nutritional Needs:   Kcal:  1550-1850kcal/day   Protein:  74-86g/day   Fluid:  >1.5L/day   EDUCATION NEEDS:   No education needs identified at this time  Koleen Distance, RD, LDN Pager #[phone]

## 2016-10-10 ENCOUNTER — Inpatient Hospital Stay (HOSPITAL_COMMUNITY): Payer: Medicare Other

## 2016-10-10 DIAGNOSIS — E440 Moderate protein-calorie malnutrition: Secondary | ICD-10-CM | POA: Insufficient documentation

## 2016-10-10 LAB — CBC
HCT: 24.5 % — ABNORMAL LOW (ref 36.0–46.0)
HEMOGLOBIN: 8.4 g/dL — AB (ref 12.0–15.0)
MCH: 30.1 pg (ref 26.0–34.0)
MCHC: 34.3 g/dL (ref 30.0–36.0)
MCV: 87.8 fL (ref 78.0–100.0)
Platelets: 153 10*3/uL (ref 150–400)
RBC: 2.79 MIL/uL — AB (ref 3.87–5.11)
RDW: 14.7 % (ref 11.5–15.5)
WBC: 11.5 10*3/uL — ABNORMAL HIGH (ref 4.0–10.5)

## 2016-10-10 LAB — GLUCOSE, CAPILLARY
GLUCOSE-CAPILLARY: 90 mg/dL (ref 65–99)
GLUCOSE-CAPILLARY: 136 mg/dL — AB (ref 65–99)
GLUCOSE-CAPILLARY: 173 mg/dL — AB (ref 65–99)
Glucose-Capillary: 133 mg/dL — ABNORMAL HIGH (ref 65–99)

## 2016-10-10 LAB — BASIC METABOLIC PANEL
Anion gap: 8 (ref 5–15)
BUN: 21 mg/dL — AB (ref 6–20)
CHLORIDE: 103 mmol/L (ref 101–111)
CO2: 28 mmol/L (ref 22–32)
Calcium: 8.8 mg/dL — ABNORMAL LOW (ref 8.9–10.3)
Creatinine, Ser: 1.05 mg/dL — ABNORMAL HIGH (ref 0.44–1.00)
GFR calc non Af Amer: 52 mL/min — ABNORMAL LOW (ref >60)
GFR, EST AFRICAN AMERICAN: 60 mL/min — AB (ref >60)
Glucose, Bld: 72 mg/dL (ref 65–99)
POTASSIUM: 4.1 mmol/L (ref 3.5–5.1)
SODIUM: 139 mmol/L (ref 135–145)

## 2016-10-10 MED ORDER — WARFARIN - PHYSICIAN DOSING INPATIENT
Freq: Every day | Status: DC
  Administered 2016-10-10 – 2016-10-11 (×2)

## 2016-10-10 MED ORDER — METOPROLOL TARTRATE 5 MG/5ML IV SOLN
5.0000 mg | Freq: Once | INTRAVENOUS | Status: AC
  Administered 2016-10-10: 5 mg via INTRAVENOUS

## 2016-10-10 MED ORDER — METOPROLOL TARTRATE 5 MG/5ML IV SOLN: 5.0000 mg | INTRAVENOUS | Status: DC | PRN

## 2016-10-10 MED ORDER — METOPROLOL TARTRATE 5 MG/5ML IV SOLN
INTRAVENOUS | Status: AC
  Administered 2016-10-10: 5 mg via INTRAVENOUS
  Filled 2016-10-10: qty 5

## 2016-10-10 MED ORDER — METOPROLOL TARTRATE 5 MG/5ML IV SOLN: 5.0000 mg | Freq: Once | INTRAVENOUS | Status: DC

## 2016-10-10 MED ORDER — WARFARIN SODIUM 2.5 MG PO TABS
2.5000 mg | ORAL_TABLET | Freq: Once | ORAL | Status: AC
  Administered 2016-10-10: 2.5 mg via ORAL
  Filled 2016-10-10: qty 1

## 2016-10-10 NOTE — Care Management Important Message (Signed)
Important Message  Patient Details  Name: [name] MRN: [id number] Date of Birth: 07/02/1945   Medicare Important Message Given:  Yes    Nathen May 10/10/2016, 11:36 AM

## 2016-10-10 NOTE — Progress Notes (Addendum)
Pt converted to atrial fibrillation while doing 0940 am medication pass. Pt states she can tell when she goes into the rhythm. HR 100-115. BP 138/75. PA Donielle made aware.  Given verbal order for '5mg'$  of IV metoprolol now. Given verbal order for 150cc amiodarone bolus in 30 minutes if pt does not self-convert.   Pt placed on bedrest. Updated as to plan of care.   Fritz Pickerel, RN

## 2016-10-10 NOTE — Discharge Summary (Signed)
Physician Discharge Summary       [address]       [place] [address]             [phone]    Patient ID: [name] MRN: [id number] DOB/AGE: 72-Feb-1947 72 y.o.  Admit date: 10/01/2016 Discharge date: 10/12/2016  Admission Diagnoses: 1. Acute ST elevation myocardial infarction (STEMI) of inferior wall 2. Multivessel coronary artery disease  Active Diagnoses:  1. Hyperlipidemia 2. Tobacco abuse 3. Essential hypertension 4. Paroxsymal atrial fibrillation with rapid ventricular response ([place]) 5. Diabetes mellitus 6. Asthma 7. Arthritis 8. ABL anemia  Procedure (s):  Coronary Balloon Angioplasty  Left Heart Cath and Coronary Angiography by Dr. Tamala Julian on 10/01/2016:  Conclusion    Acute inferior ST elevation myocardial infarction due to in-stent restenosis/late stent thrombosis within the mid segment of the right coronary which has 2 layers of previously placed stent.  Successful angioplasty of the mid right coronary segment improving flow from TIMI grade 2 to TIMI grade 3 with resolution of symptoms. Additional stenting was not performed.  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 A large diagonal is jailed.  Severe in-stent restenosis in the circumflex with 90% obstruction and the large first obtuse marginal. Total occlusion of the smaller second obtuse marginal.  Severe inferior wall hypokinesis. Ejection fraction 50-55%. Normal LVEDP.  RECOMMENDATIONS:   TCTS consultation to consider multivessel bypass grafting.  Hold Plavix  Aggrastat for 18 hours  IV amiodarone to convert atrial fibrillation  IV heparin for atrial fibrillation after Aggrastat discontinued.    Median sternotomy; extracorporeal circulation; coronary artery bypass grafting x4 (left internal mammary artery to left anterior descending, saphenous vein graft to first diagonal, saphenous vein graft to obtuse  marginal; saphenous vein graft to posterior descending); endoscopic vein harvest, right leg and left thigh; open vein harvest, left upper calf by Dr. Roxan Hockey on 10/06/2016.  History of Presenting Illness: [name] with multiple CRF and known CAD presented with acute onset of severe CP radiating to back and left arm. Past history significant for hypertension, hyperlipidemia, type II diabetes, and tobacco abuse. Has had multiple PCI, first was in 2001, most recent in 2012. 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 EMS called. Noted to be in atrial fib with RVR and had inferior ST elevation. Taken emergently to cath lab. There was a high grade ISR in RCA with TIMI 2 flow. Angioplasty performed with improvement to TIMI-3 flow and resolution of symptoms. R/I for MI with troponin of 2.11. This is a 71 year old woman with multiple CRF and long standing CAD presents with a STEMI treated emergently with PCI to reestablish flow. She has residual 3 vessel CAD. EF is 50%. CABG indicated for survival benefit and relief of symptoms. Potential risks, benefits, and complications were discussed with the patient and she agreed to proceed with surgery. Pre operative carotid duplex US showed no significant internal carotid artery stenosis bilaterally. She underwent a CABG x 4 on 10/06/2016.  Brief Hospital Course:  The patient was extubated the evening of surgery without difficulty. She remained afebrile and hemodynamically stable. She was A paced. Gordy Councilman, a line, chest tubes, and foley were removed early in the post operative course. Lopressor was  later started. She went into a fib with RVR on post operative day 2 (had a fib prior to surgery). She was started on Amiodarone. She then converted to SR.She was volume over loaded and diuresed.  She had ABL anemia. She did not require a post op transfusion. Last H and H was 8.4 and 24.5. She was weaned off the insulin drip.  She was tolerating a diet, Insulin was started. The patient's glucose remained well controlled. The patient was felt surgically stable for transfer from the ICU to PCTU for further convalescence on 10/09/2016. She continues to progress with cardiac rehab. She was ambulating on room air. Her external pacer was disconnected on 04/17 as her underlying heart rate was in the 70's. She did have paroxysmal atrial fibrillation. She was given Lopressor IV twice on 10/10/2016 with conversion to sinus rhythm. She was then started on Coumadin the evening of 10/10/2016. Her ec asa was decreased to 81 mg daily and her Lopressor was increased to 25 mg bid. PT and INR were monitored. She is currently on Coumadin 2.5 mg daily. Her INR is 1.09. Regarding her diabetes medications, patient took Janumet 50/1000 mg bid with a meal and Insulin (she thinks Novolin based on SS PRN). Patient instructed to continue this regimen and follow up with her medical doctor after discharge. She has been tolerating a diet and has had a bowel movement. Epicardial pacing wires were removed on 10/11/2016. Chest tube sutures will be removed the day of discharge. The patient is felt surgically stable for discharge today.   Latest Vital Signs: Blood pressure 133/67, pulse 61, temperature 98 F (36.7 C), temperature source Oral, resp. rate 18, height '5\' 7"'$  (1.702 m), weight 57.3 kg (126 lb 4.8 oz), SpO2 96 %.  Physical Exam: Cardiovascular: RRR Pulmonary: Diminished breath sounds Abdomen: Soft, non tender, bowel sounds present. Extremities: No lower extremity edema. Ecchymosis thi Wounds: Clean and dry.  No erythema or signs of infection.  Discharge Condition:Stable and discharged to home.  Recent laboratory studies:  Lab Results  Component Value Date   WBC 11.5 (H) 10/10/2016   HGB 8.4 (L) 10/10/2016   HCT  24.5 (L) 10/10/2016   MCV 87.8 10/10/2016   PLT 153 10/10/2016   Lab Results  Component Value Date   NA 139 10/10/2016   K 4.1 10/10/2016   CL 103 10/10/2016   CO2 28 10/10/2016   CREATININE 1.05 (H) 10/10/2016   GLUCOSE 72 10/10/2016    Diagnostic Studies: Dg Chest 2 View  Result Date: 10/10/2016 CLINICAL DATA:  Chest pain, status post coronary bypass grafting EXAM: CHEST  2 VIEW COMPARISON:  10/09/2016 FINDINGS: Cardiac shadow is stable. Postsurgical changes are again seen. Continued improved aeration is noted bilaterally although persistent bilateral lower lobe atelectasis is seen. Small effusions are noted bilaterally as well. No pneumothorax is seen. No bony abnormality is noted. IMPRESSION: Persistent lower lobe changes with small effusions. The overall appearance has improved from the prior exam however. Electronically Signed   By: Inez Catalina M.D.   On: 10/10/2016 07:10   Discharge Medications: Allergies as of 10/12/2016      Reactions   Ergocalciferol Nausea And Vomiting      Medication List    STOP taking these medications   clopidogrel 75 MG tablet Commonly known as:  PLAVIX   lisinopril 40 MG tablet Commonly known as:  PRINIVIL,ZESTRIL   nitroGLYCERIN 0.4 MG SL tablet Commonly known as:  NITROSTAT     TAKE these medications  amiodarone 200 MG tablet Commonly known as:  PACERONE Take 1 tablet (200 mg total) by mouth 2 (two) times daily. Take 200 mg by mouth two times daily for 10 days then take Amiodarone 200 mg by mouth daily thereafter.   aspirin 81 MG EC tablet Take 1 tablet (81 mg total) by mouth daily.   atorvastatin 80 MG tablet Commonly known as:  LIPITOR Take 1 tablet (80 mg total) by mouth daily at 6 PM.   cholecalciferol 1000 units tablet Commonly known as:  VITAMIN D Take 1,000 Units by mouth daily.   folic acid 1 MG tablet Commonly known as:  FOLVITE Take 1 mg by mouth daily.   hydroxychloroquine 200 MG tablet Commonly known as:   PLAQUENIL Take 200-400 mg by mouth daily as needed (arthritic pain/inflamation).   metoprolol tartrate 25 MG tablet Commonly known as:  LOPRESSOR Take 1 tablet (25 mg total) by mouth 2 (two) times daily.   oxyCODONE 5 MG immediate release tablet Commonly known as:  Oxy IR/ROXICODONE Take 5 mg by mouth every 4-6 hours PRN severe pain.   predniSONE 10 MG tablet Commonly known as:  DELTASONE Take 10 mg by mouth daily with breakfast.   sitaGLIPtin-metformin 50-1000 MG tablet Commonly known as:  JANUMET Take 1 tablet by mouth 2 (two) times daily with a meal.   warfarin 2.5 MG tablet Commonly known as:  COUMADIN Take 1 tablet (2.5 mg total) by mouth daily at 6 PM. Or as directed.      The patient has been discharged on:   1.Beta Blocker:  Yes [ x  ]                              No   [   ]                              If No, reason:  2.Ace Inhibitor/ARB: Yes [   ]                                     No  [  x  ]                                     If No, reason:Labile BP. Hope to start as an outpatient after discharge.  3.Statin:   Yes [ x  ]                  No  [   ]                  If No, reason:  4.Ecasa:  Yes  [ x  ]                  No   [   ]                  If No, reason:  Follow Up Appointments: Follow-up Information    Melrose Nakayama, MD Follow up on 11/21/2016.   Specialty:  Cardiothoracic Surgery Why:  PA/LAT CXR to be taken (at [facility] which is in the same building as Dr. Leonarda Salon office) on 11/21/2016 at 10:15 am ; Appointment time is  at 10:45 am Contact information: [address] [place] [address] [phone]        Scott Weaver, PA-C Follow up on 10/24/2016.   Specialties:  Cardiology, Physician Assistant Why:  Appointment time is at 10:15 am Contact information: [address] [address] [place] Follow up on 10/16/2016.   Why:  Please draw PT and INR  (as is on Coumadin for a fib) on Monday 10/16/2016. Please call or fax results to Dr. Thompson Caul office.       [facility] Follow up.   Specialty:  Home Health Services Why:  [facility] arranged- they will call you to set up home visits.  Contact information: [place] [address] [phone]        Townsend Roger, MD Follow up.   Specialty:  Internal Medicine Why:  Call for a follow up appointment regarding diabetes management and further surveillance of [facility] Contact information: [place] [address] [phone]           Signed: Lars Pinks MPA-C 10/12/2016, 8:20 AM

## 2016-10-10 NOTE — Progress Notes (Addendum)
Notified by CCMD that pt converted to atrial fibrillation. Pt lying in bed with sons at bedside, only complaint is coughing spell. HR 102-110. BP 145/72. PA Erin Barrett paged. Awaiting recommendations.   Given verbal orders for metoprolol. See eMAR.   Fritz Pickerel, RN

## 2016-10-10 NOTE — Progress Notes (Signed)
CARDIAC REHAB PHASE I   PRE:  Rate/Rhythm: 62 SR  BP:  Supine:   Sitting: 118/69  Standing:    SaO2: 96%RA  MODE:  Ambulation: 550 ft   POST:  Rate/Rhythm: 71 SR  BP:  Supine:   Sitting: 127/58  Standing:    SaO2: 100%RA 1042-1109 OK to walk now. Pt walked 550 ft on RA with rolling walker with steady gait. Tolerated well and remained in NSR. To sitting on side of bed after walk.   Graylon Good, RN BSN  10/10/2016 11:06 AM

## 2016-10-10 NOTE — Progress Notes (Signed)
Pt self converted to NSR HR 70s at 1730 prior to admin of 1x dose of metoprolol. IV metoprolol not given. Warfarin given, per order. Pt updated as to plan of care. Call bell within reach.   Fritz Pickerel, RN

## 2016-10-10 NOTE — Progress Notes (Signed)
[phone] Pt really wanted to walk. Getting a.m. meds. Took BP at 138/75 and sats at 96%RA. HR was 75 SR but to 112 afib before we could walk. RN called PA. Assisted pt to bed and got her comfortable. Encouraged IS. Pt to receive IV medication for rhythm. Graylon Good RN BSN 10/10/2016 9:53 AM

## 2016-10-10 NOTE — Progress Notes (Addendum)
      [address]       [place] [address]             [phone]        4 Days Post-Op Procedure(s) (LRB): CORONARY ARTERY BYPASS GRAFTING (CABG) x four, using internal mammary, and bilateral saphenous veins harvested endoscopically (N/A) TRANSESOPHAGEAL ECHOCARDIOGRAM (TEE) (N/A)  Subjective: Patient sitting in chair. Only complaint is tape on right side of neck is pulling.  Objective: Vital signs in last 24 hours: Temp:  [98 F (36.7 C)-98.5 F (36.9 C)] 98.3 F (36.8 C) (04/17 0650) Pulse Rate:  [80-81] 80 (04/17 0650) Cardiac Rhythm: Atrial paced (04/16 2126) Resp:  [18-25] 18 (04/17 0650) BP: (111-140)/(59-80) 126/74 (04/17 0650) SpO2:  [92 %-97 %] 93 % (04/17 0650) Weight:  [59.9 kg (132 lb)-60.2 kg (132 lb 11.2 oz)] 59.9 kg (132 lb) (04/17 0650)  Pre op weight 59 kg Current Weight  10/10/16 59.9 kg (132 lb)      Intake/Output from previous day: 04/16 0701 - 04/17 0700 In: 1046.7 [P.O.:1020; I.V.:26.7] Out: 851 [Urine:850; Stool:1]   Physical Exam:  Cardiovascular: RRR Pulmonary: Diminished breath sounds Abdomen: Soft, non tender, bowel sounds present. Extremities: No lower extremity edema. Ecchymosis thi Wounds: Clean and dry.  No erythema or signs of infection.  Lab Results: CBC: Recent Labs  10/09/16 0522 10/10/16 0303  WBC 10.8* 11.5*  HGB 8.0* 8.4*  HCT 23.9* 24.5*  PLT 120* 153   BMET:  Recent Labs  10/09/16 0522 10/10/16 0303  NA 139 139  K 3.9 4.1  CL 103 103  CO2 28 28  GLUCOSE 68 72  BUN 17 21*  CREATININE 0.89 1.05*  CALCIUM 8.4* 8.8*    PT/INR:  Lab Results  Component Value Date   INR 1.58 10/06/2016   INR 0.94 08/26/2010   ABG:  INR: Will add last result for INR, ABG once components are confirmed Will add last 4 CBG results once components are confirmed  Assessment/Plan:  1. CV - S/p NSTEMI. Previous a fib. Externally paced. On Amiodarone 400 mg bid and Lopressor 12.5 mg bid. Hope to start  ACE/ARB soon. 2.  Pulmonary - On room air. CXR this am shows small pleural effusions, no pneumothorax. Encourage incentive spirometer. 3.  Acute blood loss anemia - H and H stable 8.4 and 24.5. 4. DM-CBGs 102/155/90. On Insulin.  ZIMMERMAN,DONIELLE MPA-C 10/10/2016,7:23 AM Patient seen and examined, agree with above 70 SR under pacer- will dc external pacer and follow  Remo Lipps C. Roxan Hockey, MD [facility] [phone]

## 2016-10-10 NOTE — Progress Notes (Addendum)
Pt converted back to SR at 10:10 AM. HR 61. PA made aware. Stated pt is okay to ambulate provided HR remains below 110.   Fritz Pickerel, RN

## 2016-10-10 NOTE — Care Management Note (Signed)
Case Management Note Marvetta Gibbons RN, BSN Unit 2W-Case Manager [phone]  Patient Details  Name: [name] MRN: [id number] Date of Birth: Jan 03, 1946  Subjective/Objective:  Pt admitted with STEMI, s/p CABGx4 on 10/06/16- tx from [place] to 2W on 10/09/16- post op Afib- still with ext. pacer                 Action/Plan: PTA pt lived at home- CM will continue to follow for d/c needs-   Expected Discharge Date:                  Expected Discharge Plan:  Home/Self Care  In-House Referral:     Discharge planning Services  CM Consult  Post Acute Care Choice:  NA Choice offered to:  NA  DME Arranged:    DME Agency:     HH Arranged:    HH Agency:     Status of Service:  In process, will continue to follow  If discussed at Long Length of Stay Meetings, dates discussed:    Discharge Disposition:   Additional Comments:  Dawayne Patricia, RN 10/10/2016, 2:56 PM

## 2016-10-11 LAB — GLUCOSE, CAPILLARY
GLUCOSE-CAPILLARY: 176 mg/dL — AB (ref 65–99)
GLUCOSE-CAPILLARY: 235 mg/dL — AB (ref 65–99)
Glucose-Capillary: 103 mg/dL — ABNORMAL HIGH (ref 65–99)
Glucose-Capillary: 125 mg/dL — ABNORMAL HIGH (ref 65–99)

## 2016-10-11 MED ORDER — COUMADIN BOOK
Freq: Once | Status: AC
  Administered 2016-10-11: 11:00:00
  Filled 2016-10-11: qty 1

## 2016-10-11 MED ORDER — WARFARIN VIDEO
Freq: Once | Status: AC
  Administered 2016-10-11: 11:00:00

## 2016-10-11 MED ORDER — WARFARIN SODIUM 2.5 MG PO TABS
2.5000 mg | ORAL_TABLET | Freq: Every day | ORAL | Status: DC
  Administered 2016-10-11: 2.5 mg via ORAL
  Filled 2016-10-11: qty 1

## 2016-10-11 MED ORDER — METOPROLOL TARTRATE 25 MG PO TABS
25.0000 mg | ORAL_TABLET | Freq: Two times a day (BID) | ORAL | Status: DC
  Administered 2016-10-11 – 2016-10-12 (×3): 25 mg via ORAL
  Filled 2016-10-11 (×3): qty 1

## 2016-10-11 MED ORDER — ASPIRIN EC 81 MG PO TBEC
81.0000 mg | DELAYED_RELEASE_TABLET | Freq: Every day | ORAL | Status: DC
  Administered 2016-10-11 – 2016-10-12 (×2): 81 mg via ORAL
  Filled 2016-10-11 (×2): qty 1

## 2016-10-11 MED ORDER — WARFARIN SODIUM 5 MG PO TABS: 5.0000 mg | ORAL_TABLET | Freq: Every day | ORAL | Status: DC

## 2016-10-11 NOTE — Progress Notes (Addendum)
      [address]       [place] [address]             [phone]        5 Days Post-Op Procedure(s) (LRB): CORONARY ARTERY BYPASS GRAFTING (CABG) x four, using internal mammary, and bilateral saphenous veins harvested endoscopically (N/A) TRANSESOPHAGEAL ECHOCARDIOGRAM (TEE) (N/A)  Subjective: Patient without complaints. Hopes to go home soon.  Objective: Vital signs in last 24 hours: Temp:  [97.7 F (36.5 C)-98.6 F (37 C)] 97.7 F (36.5 C) (04/18 0447) Pulse Rate:  [63-71] 63 (04/18 0447) Cardiac Rhythm: Normal sinus rhythm (04/18 0700) Resp:  [16-18] 18 (04/18 0447) BP: (114-129)/(59-79) 124/79 (04/18 0447) SpO2:  [94 %-98 %] 98 % (04/18 0447) Weight:  [58.4 kg (128 lb 12.8 oz)] 58.4 kg (128 lb 12.8 oz) (04/18 0447)  Pre op weight 59 kg Current Weight  10/11/16 58.4 kg (128 lb 12.8 oz)      Intake/Output from previous day: 04/17 0701 - 04/18 0700 In: 1260 [P.O.:1260] Out: -    Physical Exam:  Cardiovascular: RRR Pulmonary: Slightly diminished basilar breath sounds Abdomen: Soft, non tender, bowel sounds present. Extremities: No lower extremity edema. Ecchymosis bilateral thighs Wounds: Clean and dry.  No erythema or signs of infection.  Lab Results: CBC:  Recent Labs  10/09/16 0522 10/10/16 0303  WBC 10.8* 11.5*  HGB 8.0* 8.4*  HCT 23.9* 24.5*  PLT 120* 153   BMET:   Recent Labs  10/09/16 0522 10/10/16 0303  NA 139 139  K 3.9 4.1  CL 103 103  CO2 28 28  GLUCOSE 68 72  BUN 17 21*  CREATININE 0.89 1.05*  CALCIUM 8.4* 8.8*    PT/INR:  Lab Results  Component Value Date   INR 1.58 10/06/2016   INR 0.94 08/26/2010   ABG:  INR: Will add last result for INR, ABG once components are confirmed Will add last 4 CBG results once components are confirmed  Assessment/Plan:  1. CV - S/p NSTEMI. PAF.  Given  IV Lopressor on two occasions when in a fib with RVR yesterdeay. On Amiodarone 400 mg bid and Lopressor 12.5 mg  bid, and Coumadin (started last evening). Will not increase Lopressor as HR 70 this am. Monitor HR as may need to decrease Amiodarone. Check INR and decrease ec asa to 81 mg daily. 2.  Pulmonary - On room air. Encourage incentive spirometer. 3.  Acute blood loss anemia - Last H and H stable 8.4 and 24.5. 4. DM-CBGs 173/133/103. On Insulin. 5. Remove EPW 6. Will discharge once able to determine Coumadin dose  ZIMMERMAN,DONIELLE MPA-C 10/11/2016,8:31 AM Patient seen and examined, agree with above except will increase lopressor dose Home tomorrow if she maintains SR  Levie Owensby C. Roxan Hockey, MD [facility] [phone]

## 2016-10-11 NOTE — Progress Notes (Signed)
10/11/2016 1:42 PM EPW D/C'd per order and per protocol.  Ends intact. Pt. Tolerated well.  Advised bedrest x1hr.  Call bell in reach.  Vital signs collected per protocol. [place]

## 2016-10-11 NOTE — Consult Note (Signed)
[facility] CM Primary Care Navigator  10/11/2016  [name] February 18, 1946 [id number]    Met with patientat the bedside to identify possible discharge needs. Patient reports having increased chest pain radiating to her back that had led to this admission/ surgery. Patient states that she used to see Dr. Marcello Moores but no longer in practice. She confirms Dr. Vassie Loll Eyk [facility] her[facility].   [facility] [place] [facility] [place] [facility].   Patient statesmanaging herownmedications at home straight out of the containers (not taking much per patient).   Her daughter Olegario Shearer- lives close by)provides transportation to her doctors' appointments as stated. Patient reports living alone and being independent with self care. Her sons Abe People and Legrand Como from [place]) will be here to assist with her care at home until the weekend. Patient's niece Molli Posey- from [place]) is also coming over and staying with her until she fully recovers according to patient.  Anticipated discharge plan is home with home health services and family assistance per patient.   Patient expressed understanding to call primary care provider's office when shereturns home, for a post discharge follow-up appointment within a week or sooner if needed.Patient letter (with PCP's contact number) was provided as herreminder.  Talked topatientabout [facility] CM services available for healthmanagement and she denies any health management needs or concerns at this time. [facility] care management contact information provided for future needs that may arise.  For questions, please contact:  Dannielle Huh, BSN, RN- [facility] Primary Care Navigator  Telephone: [phone] [place]

## 2016-10-11 NOTE — Care Management Note (Signed)
Case Management Note Marvetta Gibbons RN, BSN Unit 2W-Case Manager [phone]  Patient Details  Name: [name] MRN: [id number] Date of Birth: [birth year]  Subjective/Objective:  Pt admitted with STEMI, s/p CABGx4 on 10/06/16- tx from [place] to 2W on 10/09/16- post op Afib- still with ext. pacer                 Action/Plan: PTA pt lived at home- CM will continue to follow for d/c needs-   Expected Discharge Date:                  Expected Discharge Plan:  [place]  In-House Referral:     Discharge planning Services  CM Consult  Post Acute Care Choice:  Home Health Choice offered to:  Patient  DME Arranged:    DME Agency:     HH Arranged:  RN Welcome Agency:  [place]  Status of Service:  Completed, signed off  If discussed at [place] of Stay Meetings, dates discussed:    Discharge Disposition: home/home health   Additional Comments:  10/11/16- 1530- Marvetta Gibbons RN, CM- order for [facility] placed for discharge- spoke with pt at bedside- list provided to pt for choice- per pt she would like to use [facility] for [facility] services- referral called to [facility] with [facility]- referral has been accepted for [facility]-  At this time pt does not feel like she will need a RW- but will let staff know if she changes her mind.   Dawayne Patricia, RN 10/11/2016, 4:16 PM

## 2016-10-11 NOTE — Progress Notes (Signed)
CARDIAC REHAB PHASE I   PRE:  Rate/Rhythm: 75 SR  BP:  Supine:   Sitting: 131/64  Standing:    SaO2: 98%RA  MODE:  Ambulation: 700 ft   POST:  Rate/Rhythm: 84 SR  BP:  Supine:   Sitting: 133/56  Standing:    SaO2: 99%RA 0953-1015 Pt walked 700 ft on RA with hand held asst with steady gait. Does not need walker. Discussed CRP 2 and will refer to [facility] program. Remained in NSR. Tolerated well.   Graylon Good, RN BSN  10/11/2016 10:12 AM

## 2016-10-11 NOTE — Discharge Instructions (Addendum)
Information on my medicine - Coumadin   (Warfarin)  This medication education was reviewed with me or my healthcare representative as part of my discharge preparation.  Why was Coumadin prescribed for you? Coumadin was prescribed for you because you have a blood clot or a medical condition that can cause an increased risk of forming blood clots. Blood clots can cause serious health problems by blocking the flow of blood to the heart, lung, or brain. Coumadin can prevent harmful blood clots from forming. As a reminder your indication for Coumadin is:   Stroke Prevention Because Of Atrial Fibrillation  What test will check on my response to Coumadin? While on Coumadin (warfarin) you will need to have an INR test regularly to ensure that your dose is keeping you in the desired range. The INR (international normalized ratio) number is calculated from the result of the laboratory test called prothrombin time (PT).  If an INR APPOINTMENT HAS NOT ALREADY BEEN MADE FOR YOU please schedule an appointment to have this lab work done by your health care provider within 7 days. Your INR goal is usually a number between:  2 to 3 or your provider may give you a more narrow range like 2-2.5.  Ask your health care provider during an office visit what your goal INR is.  What  do you need to  know  About  COUMADIN? Take Coumadin (warfarin) exactly as prescribed by your healthcare provider about the same time each day.  DO NOT stop taking without talking to the doctor who prescribed the medication.  Stopping without other blood clot prevention medication to take the place of Coumadin may increase your risk of developing a new clot or stroke.  Get refills before you run out.  What do you do if you miss a dose? If you miss a dose, take it as soon as you remember on the same day then continue your regularly scheduled regimen the next day.  Do not take two doses of Coumadin at the same time.  Important Safety  Information A possible side effect of Coumadin (Warfarin) is an increased risk of bleeding. You should call your healthcare provider right away if you experience any of the following: ? Bleeding from an injury or your nose that does not stop. ? Unusual colored urine (red or dark brown) or unusual colored stools (red or black). ? Unusual bruising for unknown reasons. ? A serious fall or if you hit your head (even if there is no bleeding).  Some foods or medicines interact with Coumadin (warfarin) and might alter your response to warfarin. To help avoid this: ? Eat a balanced diet, maintaining a consistent amount of Vitamin K. ? Notify your provider about major diet changes you plan to make. ? Avoid alcohol or limit your intake to 1 drink for women and 2 drinks for men per day. (1 drink is 5 oz. wine, 12 oz. beer, or 1.5 oz. liquor.)  Make sure that ANY health care provider who prescribes medication for you knows that you are taking Coumadin (warfarin).  Also make sure the healthcare provider who is monitoring your Coumadin knows when you have started a new medication including herbals and non-prescription products.  Coumadin (Warfarin)  Major Drug Interactions  Increased Warfarin Effect Decreased Warfarin Effect  Alcohol (large quantities) Antibiotics (esp. Septra/Bactrim, Flagyl, Cipro) Amiodarone (Cordarone) Aspirin (ASA) Cimetidine (Tagamet) Megestrol (Megace) NSAIDs (ibuprofen, naproxen, etc.) Piroxicam (Feldene) Propafenone (Rythmol SR) Propranolol (Inderal) Isoniazid (INH) Posaconazole (Noxafil) Barbiturates (Phenobarbital) Carbamazepine (Tegretol)  Chlordiazepoxide (Librium) Cholestyramine (Questran) Griseofulvin Oral Contraceptives Rifampin Sucralfate (Carafate) Vitamin K   Coumadin (Warfarin) Major Herbal Interactions  Increased Warfarin Effect Decreased Warfarin Effect  Garlic Ginseng Ginkgo biloba Coenzyme Q10 Green tea St. Johns wort    Coumadin (Warfarin)  FOOD Interactions  Eat a consistent number of servings per week of foods HIGH in Vitamin K (1 serving =  cup)  Collards (cooked, or boiled & drained) Kale (cooked, or boiled & drained) Mustard greens (cooked, or boiled & drained) Parsley *serving size only =  cup Spinach (cooked, or boiled & drained) Swiss chard (cooked, or boiled & drained) Turnip greens (cooked, or boiled & drained)  Eat a consistent number of servings per week of foods MEDIUM-HIGH in Vitamin K (1 serving = 1 cup)  Asparagus (cooked, or boiled & drained) Broccoli (cooked, boiled & drained, or raw & chopped) Brussel sprouts (cooked, or boiled & drained) *serving size only =  cup Lettuce, raw (green leaf, endive, romaine) Spinach, raw Turnip greens, raw & chopped   These websites have more information on Coumadin (warfarin):  FailFactory.se; VeganReport.com.au;   Coronary Artery Bypass Grafting, Care After This sheet gives you information about how to care for yourself after your procedure. Your health care provider may also give you more specific instructions. If you have problems or questions, contact your health care provider. What can I expect after the procedure? After the procedure, it is common to have:  Nausea and a lack of appetite.  Constipation.  Weakness and fatigue.  Depression or irritability.  Pain or discomfort in your incision areas. Follow these instructions at home: Medicines   Take over-the-counter and prescription medicines only as told by your health care provider. Do not stop taking medicines or start any new medicines without approval from your health care provider.  If you were prescribed an antibiotic medicine, take it as told by your health care provider. Do not stop taking the antibiotic even if you start to feel better.  Do not drive or use heavy machinery while taking prescription pain medicine. Incision care   Follow instructions from your health care  provider about how to take care of your incisions. Make sure you:  Wash your hands with soap and water before you change your bandage (dressing). If soap and water are not available, use hand sanitizer.  Change your dressing as told by your health care provider.  Leave stitches (sutures), skin glue, or adhesive strips in place. These skin closures may need to stay in place for 2 weeks or longer. If adhesive strip edges start to loosen and curl up, you may trim the loose edges. Do not remove adhesive strips completely unless your health care provider tells you to do that.  Keep incision areas clean, dry, and protected.  Check your incision areas every day for signs of infection. Check for:  More redness, swelling, or pain.  More fluid or blood.  Warmth.  Pus or a bad smell.  If incisions were made in your legs:  Avoid crossing your legs.  Avoid sitting for long periods of time. Change positions every 30 minutes.  Raise (elevate) your legs when you are sitting. Bathing   Do not take baths, swim, or use a hot tub until your health care provider approves.  Only take sponge baths. Pat the incisions dry. Do not rub incisions with a washcloth or towel.  Ask your health care provider when you can shower. Eating and drinking   Eat foods that are high  in fiber, such as raw fruits and vegetables, whole grains, beans, and nuts. Meats should be lean cut. Avoid canned, processed, and fried foods. This can help prevent constipation and is a recommended part of a heart-healthy diet.  Drink enough fluid to keep your urine clear or pale yellow.  Limit alcohol intake to no more than 1 drink a day for nonpregnant women and 2 drinks a day for men. One drink equals 12 oz of beer, 5 oz of wine, or 1 oz of hard liquor. Activity   Rest and limit your activity as told by your health care provider. You may be instructed to:  Stop any activity right away if you have chest pain, shortness of breath,  irregular heartbeats, or dizziness. Get help right away if you have any of these symptoms.  Move around frequently for short periods or take short walks as directed by your health care provider. Gradually increase your activities. You may need physical therapy or cardiac rehabilitation to help strengthen your muscles and build your endurance.  Avoid lifting, pushing, or pulling anything that is heavier than 10 lb (4.5 kg) for at least 6 weeks or as told by your health care provider.  Do not drive until your health care provider approves.  Ask your health care provider when you may return to work.  Ask your health care provider when you may resume sexual activity. General instructions   Do not use any products that contain nicotine or tobacco, such as cigarettes and e-cigarettes. If you need help quitting, ask your health care provider.  Take 2-3 deep breaths every few hours during the day, while you recover. This helps expand your lungs and prevent complications like pneumonia after surgery.  If you were given a device called an incentive spirometer, use it several times a day to practice deep breathing. Support your chest with a pillow or your arms when you take deep breaths or cough.  Wear compression stockings as told by your health care provider. These stockings help to prevent blood clots and reduce swelling in your legs.  Weigh yourself every day. This helps identify if your body is holding (retaining) fluid that may make your heart and lungs work harder.  Keep all follow-up visits as told by your health care provider. This is important. Contact a health care provider if:  You have more redness, swelling, or pain around any incision.  You have more fluid or blood coming from any incision.  Any incision feels warm to the touch.  You have pus or a bad smell coming from any incision  You have a fever.  You have swelling in your ankles or legs.  You have pain in your  legs.  You gain 2 lb (0.9 kg) or more a day.  You are nauseous or you vomit.  You have diarrhea. Get help right away if:  You have chest pain that spreads to your jaw or arms.  You are short of breath.  You have a fast or irregular heartbeat.  You notice a "clicking" in your breastbone (sternum) when you move.  You have numbness or weakness in your arms or legs.  You feel dizzy or light-headed. Summary  After the procedure, it is common to have pain or discomfort in the incision areas.  Do not take baths, swim, or use a hot tub until your health care provider approves.  Gradually increase your activities. You may need physical therapy or cardiac rehabilitation to help strengthen your muscles and  build your endurance.  Weigh yourself every day. This helps identify if your body is holding (retaining) fluid that may make your heart and lungs work harder. This information is not intended to replace advice given to you by your health care provider. Make sure you discuss any questions you have with your health care provider. Document Released: 12/30/2004 Document Revised: 05/01/2016 Document Reviewed: 05/01/2016 Elsevier Interactive Patient Education  2017 [employer].

## 2016-10-12 LAB — GLUCOSE, CAPILLARY
GLUCOSE-CAPILLARY: 119 mg/dL — AB (ref 65–99)
Glucose-Capillary: 109 mg/dL — ABNORMAL HIGH (ref 65–99)

## 2016-10-12 LAB — PROTIME-INR
INR: 1.09
Prothrombin Time: 14.2 seconds (ref 11.4–15.2)

## 2016-10-12 MED ORDER — ATORVASTATIN CALCIUM 80 MG PO TABS: 80.0000 mg | ORAL_TABLET | Freq: Every day | ORAL | 1 refills | Status: DC

## 2016-10-12 MED ORDER — AMIODARONE HCL 200 MG PO TABS
200.0000 mg | ORAL_TABLET | Freq: Two times a day (BID) | ORAL | 1 refills | Status: DC
Start: 2016-10-12 — End: 2016-12-11

## 2016-10-12 MED ORDER — ASPIRIN 81 MG PO TBEC
81.0000 mg | DELAYED_RELEASE_TABLET | Freq: Every day | ORAL | Status: DC
Start: 2016-10-12 — End: 2017-06-01

## 2016-10-12 MED ORDER — OXYCODONE HCL 5 MG PO TABS: ORAL_TABLET | ORAL | 0 refills | Status: DC

## 2016-10-12 MED ORDER — WARFARIN SODIUM 2.5 MG PO TABS: 2.5000 mg | ORAL_TABLET | Freq: Every day | ORAL | 1 refills | Status: DC

## 2016-10-12 MED ORDER — SITAGLIPTIN PHOS-METFORMIN HCL 50-1000 MG PO TABS: 1.0000 | ORAL_TABLET | Freq: Two times a day (BID) | ORAL | Status: DC

## 2016-10-12 MED ORDER — METOPROLOL TARTRATE 25 MG PO TABS: 25.0000 mg | ORAL_TABLET | Freq: Two times a day (BID) | ORAL | 1 refills | Status: DC

## 2016-10-12 NOTE — Progress Notes (Signed)
Ed completed with pt and sons. Voiced understanding. She is quitting smoking. Already referred to [facility].  [place], ACSM 11:51 AM 10/12/2016

## 2016-10-12 NOTE — Progress Notes (Addendum)
      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  Objective: Vital signs in last 24 hours: Temp:  [98 F (36.7 C)-98.9 F (37.2 C)] 98 F (36.7 C) (04/19 0149) Pulse Rate:  [61-71] 61 (04/19 0149) Cardiac Rhythm: Normal sinus rhythm (04/18 1903) Resp:  [17-18] 18 (04/19 0149) BP: (114-137)/(52-84) 133/67 (04/19 0149) SpO2:  [96 %-100 %] 96 % (04/19 0149) Weight:  [57.3 kg (126 lb 4.8 oz)] 57.3 kg (126 lb 4.8 oz) (04/19 0149)  Pre op weight 59 kg Current Weight  10/12/16 57.3 kg (126 lb 4.8 oz)      Intake/Output from previous day: 04/18 0701 - 04/19 0700 In: 723 [P.O.:720; I.V.:3] Out: -    Physical Exam:  Cardiovascular: RRR Pulmonary: Slightly diminished basilar breath sounds Abdomen: Soft, non tender, bowel sounds present. Extremities: No lower extremity edema.  Wounds: Clean and dry.  No erythema or signs of infection.  Lab Results: CBC:  Recent Labs  10/10/16 0303  WBC 11.5*  HGB 8.4*  HCT 24.5*  PLT 153   BMET:   Recent Labs  10/10/16 0303  NA 139  K 4.1  CL 103  CO2 28  GLUCOSE 72  BUN 21*  CREATININE 1.05*  CALCIUM 8.8*    PT/INR:  Lab Results  Component Value Date   INR 1.09 10/12/2016   INR 1.58 10/06/2016   INR 0.94 08/26/2010   ABG:  INR: Will add last result for INR, ABG once components are confirmed Will add last 4 CBG results once components are confirmed  Assessment/Plan:  1. CV - S/p NSTEMI. PAF.  SR in the 70's this am. On Amiodarone 400 mg bid and Lopressor 25 mg bid, and Coumadin . Will decrease Amiodarone to 200 mg bid in am. INR 1.09. Will send home on Coumadin 2.5 mg daily. Was on Plavix pre op but no  need to restart. 2.  Pulmonary - On room air. Encourage incentive spirometer. 3.  Acute blood loss anemia - Last H and H stable 8.4 and 24.5. 4. DM-CBGs 235/125/119. On Insulin. 5. Volume overload-has been on Lasix 40 mg daily. She is below pre op weight and has no LE edema so will stop after today's dose. 6. Possibly discharge later today  ZIMMERMAN,DONIELLE MPA-C 10/12/2016,7:24 AM Patient seen and examined, agree with above Dc on coumadin. Was in atrial fib at time of presentation not just postop.   Revonda Standard Roxan Hockey, MD [facility] [phone]

## 2016-10-13 ENCOUNTER — Telehealth: Payer: Self-pay | Admitting: *Deleted

## 2016-10-13 DIAGNOSIS — E1165 Type 2 diabetes mellitus with hyperglycemia: Secondary | ICD-10-CM | POA: Diagnosis not present

## 2016-10-13 DIAGNOSIS — Z48812 Encounter for surgical aftercare following surgery on the circulatory system: Secondary | ICD-10-CM | POA: Diagnosis not present

## 2016-10-13 DIAGNOSIS — M1990 Unspecified osteoarthritis, unspecified site: Secondary | ICD-10-CM | POA: Diagnosis not present

## 2016-10-13 NOTE — Telephone Encounter (Addendum)
[name] called the office to relate some GI symptoms that she had had since discharge yesterfay after CABG on 10/06/16. She is having nausea, vomiting mostly phlegm. She said that all VS were stable. I said that I would call her at home. I did and after questioning her, it seems this is reflux. She said she has medicine for it that she usually takes. I told her to start it today and she agree. Her symptoms started with loud belching, then her stomach would start "churning" and then usually clear liquid would be expectorated. When I mentioned reflux, she said that she has had that.

## 2016-10-16 ENCOUNTER — Telehealth: Payer: Self-pay | Admitting: Physician Assistant

## 2016-10-16 ENCOUNTER — Ambulatory Visit (INDEPENDENT_AMBULATORY_CARE_PROVIDER_SITE_OTHER): Payer: Self-pay | Admitting: Internal Medicine

## 2016-10-16 DIAGNOSIS — Z5181 Encounter for therapeutic drug level monitoring: Secondary | ICD-10-CM | POA: Insufficient documentation

## 2016-10-16 DIAGNOSIS — I4891 Unspecified atrial fibrillation: Secondary | ICD-10-CM

## 2016-10-16 DIAGNOSIS — M1990 Unspecified osteoarthritis, unspecified site: Secondary | ICD-10-CM | POA: Diagnosis not present

## 2016-10-16 DIAGNOSIS — E1165 Type 2 diabetes mellitus with hyperglycemia: Secondary | ICD-10-CM | POA: Diagnosis not present

## 2016-10-16 LAB — POCT INR: INR: 1.1

## 2016-10-16 NOTE — Telephone Encounter (Signed)
Ok to fill diabetic strips x 1 month. But, she needs a PCP to fill this in the future. If she does not have a PCP, we need to refer her.  She needs follow up in the next 2 weeks for post hospital follow up. Richardson Dopp, PA-C    10/16/2016 5:22 PM

## 2016-10-16 NOTE — Telephone Encounter (Signed)
We can manage patient's INR. See separate anticoag encounter from today for details regarding dosing.

## 2016-10-16 NOTE — Telephone Encounter (Signed)
New message    INR - 1.1  PT 12.9  currently taking 2.5 cumn daily and she wants to know if [facility] will be signing [facility] orders  Can you call in an order for pt diabetic test strips

## 2016-10-16 NOTE — Telephone Encounter (Signed)
I will route this phone note to Pharm D in our [facility] for further evaluation if [facility] will be signing INR orders. I will route to [place] about diabetic strips request. Pt has upcoming appt 10/24/16 with PA. Pt last seen in our office 09/26/10, though recent hospitalization with CABG and seen by Dr. Tamala Julian.

## 2016-10-17 NOTE — Telephone Encounter (Signed)
I returned call to [employer], Therapist, sports and lmtcb to go over recommendations per [employer], Pharm-D and Richardson Dopp, [place].

## 2016-10-19 ENCOUNTER — Telehealth: Payer: Self-pay | Admitting: Physician Assistant

## 2016-10-19 DIAGNOSIS — I2510 Atherosclerotic heart disease of native coronary artery without angina pectoris: Secondary | ICD-10-CM | POA: Diagnosis not present

## 2016-10-19 DIAGNOSIS — I4891 Unspecified atrial fibrillation: Secondary | ICD-10-CM | POA: Diagnosis not present

## 2016-10-19 NOTE — Telephone Encounter (Signed)
Attempted to contact patient to check status since hospital follow up.  There was no answer or voicemail system that picked up.

## 2016-10-20 ENCOUNTER — Emergency Department (HOSPITAL_COMMUNITY): Payer: Medicare Other

## 2016-10-20 ENCOUNTER — Encounter: Payer: Self-pay | Admitting: Physician Assistant

## 2016-10-20 ENCOUNTER — Telehealth: Payer: Self-pay

## 2016-10-20 ENCOUNTER — Emergency Department (HOSPITAL_COMMUNITY)
Admission: EM | Admit: 2016-10-20 | Discharge: 2016-10-20 | Disposition: A | Discharge status: Home / Self Care | Payer: Medicare Other | Attending: Emergency Medicine | Admitting: Emergency Medicine

## 2016-10-20 DIAGNOSIS — Z951 Presence of aortocoronary bypass graft: Secondary | ICD-10-CM | POA: Insufficient documentation

## 2016-10-20 DIAGNOSIS — Z79899 Other long term (current) drug therapy: Secondary | ICD-10-CM | POA: Insufficient documentation

## 2016-10-20 DIAGNOSIS — I2510 Atherosclerotic heart disease of native coronary artery without angina pectoris: Secondary | ICD-10-CM | POA: Insufficient documentation

## 2016-10-20 DIAGNOSIS — J45909 Unspecified asthma, uncomplicated: Secondary | ICD-10-CM | POA: Insufficient documentation

## 2016-10-20 DIAGNOSIS — Z7901 Long term (current) use of anticoagulants: Secondary | ICD-10-CM | POA: Insufficient documentation

## 2016-10-20 DIAGNOSIS — R0602 Shortness of breath: Secondary | ICD-10-CM | POA: Diagnosis not present

## 2016-10-20 DIAGNOSIS — Z7982 Long term (current) use of aspirin: Secondary | ICD-10-CM | POA: Insufficient documentation

## 2016-10-20 DIAGNOSIS — M1990 Unspecified osteoarthritis, unspecified site: Secondary | ICD-10-CM | POA: Diagnosis not present

## 2016-10-20 DIAGNOSIS — I252 Old myocardial infarction: Secondary | ICD-10-CM | POA: Insufficient documentation

## 2016-10-20 DIAGNOSIS — E1165 Type 2 diabetes mellitus with hyperglycemia: Secondary | ICD-10-CM | POA: Diagnosis not present

## 2016-10-20 DIAGNOSIS — R002 Palpitations: Secondary | ICD-10-CM | POA: Insufficient documentation

## 2016-10-20 DIAGNOSIS — F17210 Nicotine dependence, cigarettes, uncomplicated: Secondary | ICD-10-CM | POA: Diagnosis not present

## 2016-10-20 DIAGNOSIS — E119 Type 2 diabetes mellitus without complications: Secondary | ICD-10-CM | POA: Insufficient documentation

## 2016-10-20 LAB — I-STAT TROPONIN, ED: Troponin i, poc: 0 ng/mL (ref 0.00–0.08)

## 2016-10-20 LAB — CBC
HEMATOCRIT: 31 % — AB (ref 36.0–46.0)
Hemoglobin: 9.9 g/dL — ABNORMAL LOW (ref 12.0–15.0)
MCH: 28.9 pg (ref 26.0–34.0)
MCHC: 31.9 g/dL (ref 30.0–36.0)
MCV: 90.4 fL (ref 78.0–100.0)
Platelets: 375 10*3/uL (ref 150–400)
RBC: 3.43 MIL/uL — ABNORMAL LOW (ref 3.87–5.11)
RDW: 14.9 % (ref 11.5–15.5)
WBC: 11.8 10*3/uL — AB (ref 4.0–10.5)

## 2016-10-20 LAB — BASIC METABOLIC PANEL
Anion gap: 9 (ref 5–15)
BUN: 12 mg/dL (ref 6–20)
CALCIUM: 9.2 mg/dL (ref 8.9–10.3)
CO2: 28 mmol/L (ref 22–32)
CREATININE: 1.07 mg/dL — AB (ref 0.44–1.00)
Chloride: 101 mmol/L (ref 101–111)
GFR calc Af Amer: 59 mL/min — ABNORMAL LOW (ref >60)
GFR, EST NON AFRICAN AMERICAN: 51 mL/min — AB (ref >60)
Glucose, Bld: 83 mg/dL (ref 65–99)
Potassium: 3.7 mmol/L (ref 3.5–5.1)
Sodium: 138 mmol/L (ref 135–145)

## 2016-10-20 NOTE — ED Provider Notes (Signed)
[facility] DEPT Provider Note   CSN: [id number] Arrival date & time: 10/20/16  1603     History   Chief Complaint Chief Complaint  Patient presents with  . Palpitations  . Shortness of Breath    HPI [name] is a 71 y.o. female.  HPI   71 year old female with known CAD that is post CABG resents today with complaints of palpitations.  Pt  .... Patient has a history of hypertension, hyperlipidemia, type 2 diabetes and tobacco abuse.  She had multiple PCI's in the past most recently beginning of this month.  Patient underwent CABG 4 on 10/06/2016.  Pt went into A fib on day 2 post op ( Cardiology notes a history of this prior, family denies this) Started on amiodarone and converted to NSR. She had subsequent runs of PAF and given lopressor twice on 10/10/2016 with conversion to SR. On coumadin   Patient notes that last night around 630 she felt racing heart as if it was going to jump out of her chest.  She notes associated shortness of breath.  She notes this lasts for approximately 30 minutes and then stops 15.  She denies any associated chest pain.  Patient notes most recent symptoms were shortly before my evaluation.  She denies any lower extremity swelling or edema at the time my evaluation she denies any chest pain shortness of breath, palpitations or any acute complaints.  She notes she has been taking all her medications as directed.  She denies any recent infectious etiology.   Past Medical History:  Diagnosis Date  . Arthritis   . Asthma   . Chest pain   . Diabetes mellitus   . Hypertension   . MI (myocardial infarction) ([place]) 05/2002  . SOB (shortness of breath)     Patient Active Problem List   Diagnosis Date Noted  . Encounter for therapeutic drug monitoring 10/16/2016  . Malnutrition of moderate degree 10/10/2016  . Coronary artery disease 10/06/2016  . Atrial fibrillation with rapid ventricular response ([place]) 10/01/2016  . Acute ST elevation  myocardial infarction (STEMI) of inferior wall ([place]) 10/01/2016  . Acute myocardial infarction ([place]) 10/01/2016  . DIABETES MELLITUS, TYPE II 09/09/2010  . Hyperlipidemia 09/09/2010  . Tobacco abuse 09/09/2010  . Essential hypertension 09/09/2010  . CORONARY ATHEROSCLEROSIS NATIVE CORONARY ARTERY 09/09/2010  . PAIN IN JOINT, UPPER ARM 09/09/2010    Past Surgical History:  Procedure Laterality Date  . CHOLECYSTECTOMY    . CORONARY ARTERY BYPASS GRAFT N/A 10/06/2016   Procedure: CORONARY ARTERY BYPASS GRAFTING (CABG) x four, using internal mammary, and bilateral saphenous veins harvested endoscopically;  Surgeon: Melrose Nakayama, MD;  Location: [facility];  Service: Open Heart Surgery;  Laterality: N/A;  . CORONARY BALLOON ANGIOPLASTY N/A 10/01/2016   Procedure: Coronary Balloon Angioplasty;  Surgeon: Belva Crome, MD;  Location: [facility];  Service: Cardiovascular;  Laterality: N/A;  . LEFT HEART CATH AND CORONARY ANGIOGRAPHY N/A 10/01/2016   Procedure: Left Heart Cath and Coronary Angiography;  Surgeon: Belva Crome, MD;  Location: [facility];  Service: Cardiovascular;  Laterality: N/A;  . TEE WITHOUT CARDIOVERSION N/A 10/06/2016   Procedure: TRANSESOPHAGEAL ECHOCARDIOGRAM (TEE);  Surgeon: Melrose Nakayama, MD;  Location: [facility];  Service: Open Heart Surgery;  Laterality: N/A;    OB History    No data available       Home Medications    Prior to Admission medications   Medication Sig Start Date End  Date Taking? Authorizing Provider  amiodarone (PACERONE) 200 MG tablet Take 1 tablet (200 mg total) by mouth 2 (two) times daily. Take 200 mg by mouth two times daily for 10 days then take Amiodarone 200 mg by mouth daily thereafter. 10/12/16  Yes Donielle Liston Alba, PA-C  aspirin EC 81 MG EC tablet Take 1 tablet (81 mg total) by mouth daily. 10/12/16  Yes Donielle Liston Alba, PA-C  atorvastatin (LIPITOR) 80 MG tablet Take 1 tablet (80 mg total) by mouth daily at 6 PM.  10/12/16  Yes Donielle Liston Alba, PA-C  cholecalciferol (VITAMIN D) 1000 units tablet Take 1,000 Units by mouth daily.    Yes Historical Provider, MD  folic acid (FOLVITE) 1 MG tablet Take 1 mg by mouth daily.   Yes Historical Provider, MD  hydroxychloroquine (PLAQUENIL) 200 MG tablet Take 200-400 mg by mouth daily as needed (arthritic pain/inflamation).   Yes Historical Provider, MD  metoprolol tartrate (LOPRESSOR) 25 MG tablet Take 1 tablet (25 mg total) by mouth 2 (two) times daily. 10/12/16  Yes Donielle Liston Alba, PA-C  oxyCODONE (OXY IR/ROXICODONE) 5 MG immediate release tablet Take 5 mg by mouth every 4-6 hours PRN severe pain. 10/12/16  Yes Donielle Liston Alba, PA-C  predniSONE (DELTASONE) 10 MG tablet Take 10 mg by mouth daily with breakfast.   Yes Historical Provider, MD  warfarin (COUMADIN) 2.5 MG tablet Take 1 tablet (2.5 mg total) by mouth daily at 6 PM. Or as directed. Patient taking differently: Take 2.5-3.75 mg by mouth See admin instructions. Or as directed. 10/12/16  Yes Donielle Liston Alba, PA-C  sitaGLIPtin-metformin (JANUMET) 50-1000 MG tablet Take 1 tablet by mouth 2 (two) times daily with a meal. Patient not taking: Reported on 10/20/2016 10/12/16   Nani Skillern, PA-C   Family History Family History  Problem Relation Age of Onset  . Cancer Mother 14  . Cancer Father 28  . Heart disease Sister 81    and cancer  . Cancer Brother 45  . Heart attack Brother 16   Social History Social History  Substance Use Topics  . Smoking status: Current Every Day Smoker    Packs/day: 1.00    Years: 25.00    Types: Cigarettes  . Smokeless tobacco: Never Used  . Alcohol use No   Allergies   Ergocalciferol   Review of Systems Review of Systems  All other systems reviewed and are negative.  Physical Exam Updated Vital Signs BP (!) 130/100   Pulse 68   Temp 98.2 F (36.8 C) (Oral)   Resp (!) 25   Ht '5\' 7"'$  (1.702 m)   Wt 58.5 kg   SpO2 100%   BMI 20.20 kg/m    Physical Exam  Constitutional: She is oriented to person, place, and time. She appears well-developed and well-nourished.  HENT:  Head: Normocephalic and atraumatic.  Eyes: Conjunctivae are normal. Pupils are equal, round, and reactive to light. Right eye exhibits no discharge. Left eye exhibits no discharge. No scleral icterus.  Neck: Normal range of motion. No JVD present. No tracheal deviation present.  Cardiovascular: Normal rate, regular rhythm, normal heart sounds and intact distal pulses.  Exam reveals no gallop and no friction rub.   No murmur heard. Pulmonary/Chest: Effort normal. No stridor.  Neurological: She is alert and oriented to person, place, and time. Coordination normal.  Psychiatric: She has a normal mood and affect. Her behavior is normal. Judgment and thought content normal.  Nursing note and vitals reviewed.  ED Treatments / Results  Labs (all labs ordered are listed, but only abnormal results are displayed) Labs Reviewed  BASIC METABOLIC PANEL - Abnormal; Notable for the following:       Result Value   Creatinine, Ser 1.07 (*)    GFR calc non Af Amer 51 (*)    GFR calc Af Amer 59 (*)    All other components within normal limits  CBC - Abnormal; Notable for the following:    WBC 11.8 (*)    RBC 3.43 (*)    Hemoglobin 9.9 (*)    HCT 31.0 (*)    All other components within normal limits  I-STAT TROPOININ, ED    EKG  EKG Interpretation None       Radiology Dg Chest 2 View  Result Date: 10/20/2016 CLINICAL DATA:  Shortness of breath and palpitations EXAM: CHEST  2 VIEW COMPARISON:  10/10/2016 FINDINGS: Improved aeration compared to prior. Chronic interstitial coarsening. There is no edema, consolidation, effusion, or pneumothorax. Normal heart size. Status post CABG and coronary stenting. IMPRESSION: No acute finding. Electronically Signed   By: Monte Fantasia M.D.   On: 10/20/2016 16:44    Procedures Procedures (including critical care  time)  Medications Ordered in ED Medications - No data to display   Initial Impression / Assessment and Plan / ED Course  I have reviewed the triage vital signs and the nursing notes.  Pertinent labs & imaging results that were available during my care of the patient were reviewed by me and considered in my medical decision making (see chart for details).       Final Clinical Impressions(s) / ED Diagnoses   Final diagnoses:  Palpitations    Labs: I-STAT troponin, BMP, CBC  Imaging: DG Chest 2 View   Assessment/Plan: 71 year old female presents today with complaints of palpitations.  Patient does have a history of proximal atrial fibrillation, question if this is what is causing her symptoms.  She was monitored here in the ED, no symptoms while here.  She did have runs of this while in the hospital.  She appears to be stable with no signs of presyncopal episode, significant chest pain with these episodes.  Patient is Artie on amiodarone, taking metoprolol.  She has close care with her daughter.  Patient will be discharged home with instructions to return immediately if symptoms persist, or she has any new or worsening signs or symptoms.  Patient is instructed to contact cardiology and schedule follow-up appointment.  The patient and her daughter verbalized understanding and agreement to today's plan had no further questions or concerns   New Prescriptions New Prescriptions   No medications on file     Okey Regal, PA-C 10/20/16 [place], MD 10/20/16 [phone]

## 2016-10-20 NOTE — Discharge Instructions (Signed)
Please read attached information. If you experience any new or worsening signs or symptoms please return to the emergency room for evaluation. Please follow-up with your primary care provider or specialist as discussed. Please use medication prescribed only as directed and discontinue taking if you have any concerning signs or symptoms.   °

## 2016-10-20 NOTE — ED Notes (Signed)
ED Provider at bedside. 

## 2016-10-20 NOTE — ED Triage Notes (Signed)
Pt reports recent cardiac surgery two weeks ago. Having palpitations and sob since yesterday evening. HR 65 at triage and no acute distress is noted.

## 2016-10-20 NOTE — Telephone Encounter (Signed)
Luetta Nutting, RN from [facility] called regarding Mrs. [name]'s home health visit.  The patient was fatigue, weak, SOB, crackles in LL, and heart palpatations.  After consulting with Ellwood Handler, PA-C who tried to contact the patient on Thursday 10/19/16 with no answer she advised the pt needed to go to the ER.  I called Amber to give her the info and she had already recommended that to the pt and family.  The pt is on her way to [facility] ER.

## 2016-10-20 NOTE — ED Provider Notes (Signed)
  Face-to-face evaluation   History: She had transient episode of palpitations yesterday, with associated shortness of breath.  She has been able to ambulate, 3 or 4 times a day for 5 minutes.  She denies chest pain, fever, chills or cough  Physical exam: Alert elderly female.  She is lucid.  No dysarthria or aphasia.  No respiratory distress.  Medical screening examination/treatment/procedure(s) were conducted as a shared visit with non-physician practitioner(s) and myself.  I personally evaluated the patient during the encounter   Daleen Bo, MD 10/20/16 2359

## 2016-10-23 ENCOUNTER — Ambulatory Visit (INDEPENDENT_AMBULATORY_CARE_PROVIDER_SITE_OTHER): Payer: Medicare Other | Admitting: Cardiovascular Disease

## 2016-10-23 DIAGNOSIS — M1990 Unspecified osteoarthritis, unspecified site: Secondary | ICD-10-CM | POA: Diagnosis not present

## 2016-10-23 DIAGNOSIS — I4891 Unspecified atrial fibrillation: Secondary | ICD-10-CM

## 2016-10-23 DIAGNOSIS — E1165 Type 2 diabetes mellitus with hyperglycemia: Secondary | ICD-10-CM | POA: Diagnosis not present

## 2016-10-23 DIAGNOSIS — Z5181 Encounter for therapeutic drug level monitoring: Secondary | ICD-10-CM

## 2016-10-23 LAB — POCT INR: INR: 1.4

## 2016-10-24 ENCOUNTER — Ambulatory Visit (INDEPENDENT_AMBULATORY_CARE_PROVIDER_SITE_OTHER): Payer: Medicare Other | Admitting: Physician Assistant

## 2016-10-24 ENCOUNTER — Encounter: Payer: Self-pay | Admitting: Physician Assistant

## 2016-10-24 VITALS — BP 112/60 | HR 67 | Ht 67.0 in | Wt 126.8 lb

## 2016-10-24 DIAGNOSIS — I481 Persistent atrial fibrillation: Secondary | ICD-10-CM

## 2016-10-24 DIAGNOSIS — I4819 Other persistent atrial fibrillation: Secondary | ICD-10-CM

## 2016-10-24 DIAGNOSIS — I10 Essential (primary) hypertension: Secondary | ICD-10-CM

## 2016-10-24 DIAGNOSIS — E785 Hyperlipidemia, unspecified: Secondary | ICD-10-CM

## 2016-10-24 DIAGNOSIS — I2510 Atherosclerotic heart disease of native coronary artery without angina pectoris: Secondary | ICD-10-CM | POA: Diagnosis not present

## 2016-10-24 DIAGNOSIS — I2119 ST elevation (STEMI) myocardial infarction involving other coronary artery of inferior wall: Secondary | ICD-10-CM | POA: Diagnosis not present

## 2016-10-24 NOTE — Telephone Encounter (Signed)
Pt was seen in the office today 10/24/16. Pt states she does have a PCP. See ov note from today 10/24/16

## 2016-10-24 NOTE — Progress Notes (Signed)
Cardiology Office Note:    Date:  10/24/2016   ID:  [name], DOB 1946/01/14, MRN [id number]  PCP:  Townsend Roger, MD  Cardiologist:  Dr. Kathlyn [facility]  >> prefers to establish at [facility] Electrophysiologist:  n/a  Referring MD: No ref. provider found   Chief Complaint  Patient presents with  . Hospitalization Follow-up    s/p MI >> s/p CABG; c/b recurrent AFib (Coumadin/Amiodarone started)    History of Present Illness:    [name] is a 71 y.o. female with a hx of CAD status post multiple PCIs to the RCA, LAD and LCx in the past, diabetes, HTN, HL. Last seen by Dr. Kathlyn [facility] in 2012.  She was admitted4/8-4/19 with an inferior STEMI with associated AF with RVR.  She underwent emergent LHC which demonstrated last stent thrombosis of the RCA that was tx with POBA.  She had diffuse ISR of the stents in the LAD and LCx and was referred to [facility] for CABG.  She underwent CABG 10/06/16 with Dr. Roxan Hockey (L-LAD, S-D1, S-OM, S-PDA).  She had recurrent AF with RVR post op and was put on Amiodarone and converted to NSR.  She had blood loss anemia but did not require transfusion (hemoglobin 8.4).  She was eventually placed on coumadin due to recurrent atrial fibrillation.  After DC, she did return to the ED on 10/20/16 with rapid palpitations.  ECG demonstrated normal sinus rhythm.    She returns for post hospital follow up.  She is here with her daughter.  She has been doing well since DC from the hospital.  She denies chest pain, shortness of breath, syncope, orthopnea, PND, edema.  She denies any further palpitations since she went to the ED.    Prior CV studies:   The following studies were reviewed today:  Intraoperative TEE 10/06/16 EF 50-55, normal wall motion  Carotid US 10/04/16 Bilateral ICA 1-39  LHC 10/01/16 LAD proximal 60 ISR, distal 80 OM1 50 ISR, OM2 95, lateral OM 100 in the stent RCA proximal 99 ISR EF 50-55 PCI: POBA to the mid RCA >> CABG  Past  Medical History:  Diagnosis Date  . Arthritis   . Asthma   . Chest pain   . Diabetes mellitus   . Hypertension   . MI (myocardial infarction) ([place]) 05/2002  . SOB (shortness of breath)     Past Surgical History:  Procedure Laterality Date  . CHOLECYSTECTOMY    . CORONARY ARTERY BYPASS GRAFT N/A 10/06/2016   Procedure: CORONARY ARTERY BYPASS GRAFTING (CABG) x four, using internal mammary, and bilateral saphenous veins harvested endoscopically;  Surgeon: Melrose Nakayama, MD;  Location: [facility];  Service: Open Heart Surgery;  Laterality: N/A;  . CORONARY BALLOON ANGIOPLASTY N/A 10/01/2016   Procedure: Coronary Balloon Angioplasty;  Surgeon: Belva Crome, MD;  Location: [facility];  Service: Cardiovascular;  Laterality: N/A;  . LEFT HEART CATH AND CORONARY ANGIOGRAPHY N/A 10/01/2016   Procedure: Left Heart Cath and Coronary Angiography;  Surgeon: Belva Crome, MD;  Location: [facility];  Service: Cardiovascular;  Laterality: N/A;  . TEE WITHOUT CARDIOVERSION N/A 10/06/2016   Procedure: TRANSESOPHAGEAL ECHOCARDIOGRAM (TEE);  Surgeon: Melrose Nakayama, MD;  Location: [facility];  Service: Open Heart Surgery;  Laterality: N/A;    Current Medications: Current Meds  Medication Sig  . amiodarone (PACERONE) 200 MG tablet Take 1 tablet (200 mg total) by mouth 2 (two) times daily. Take  200 mg by mouth two times daily for 10 days then take Amiodarone 200 mg by mouth daily thereafter.  Marland Kitchen aspirin EC 81 MG EC tablet Take 1 tablet (81 mg total) by mouth daily.  Marland Kitchen atorvastatin (LIPITOR) 80 MG tablet Take 1 tablet (80 mg total) by mouth daily at 6 PM.  . cholecalciferol (VITAMIN D) 1000 units tablet Take 1,000 Units by mouth daily.   . folic acid (FOLVITE) 1 MG tablet Take 1 mg by mouth daily.  . hydroxychloroquine (PLAQUENIL) 200 MG tablet Take 200-400 mg by mouth daily as needed (arthritic pain/inflamation).  . metoprolol tartrate (LOPRESSOR) 25 MG tablet Take 1 tablet (25 mg total) by  mouth 2 (two) times daily.  Marland Kitchen oxyCODONE (OXY IR/ROXICODONE) 5 MG immediate release tablet Take 5 mg by mouth every 4-6 hours PRN severe pain.  Marland Kitchen VICTOZA 18 MG/3ML SOPN INJECT 120 UNITS INTO THE SKIN ONCE DAILY  . warfarin (COUMADIN) 2.5 MG tablet Take 1 tablet (2.5 mg total) by mouth daily at 6 PM. Or as directed.     Allergies:   Ergocalciferol   Social History   Social History  . Marital status: Divorced    Spouse name: N/A  . Number of children: N/A  . Years of education: N/A   Social History Main Topics  . Smoking status: Current Every Day Smoker    Packs/day: 1.00    Years: 25.00    Types: Cigarettes  . Smokeless tobacco: Never Used  . Alcohol use No  . Drug use: No  . Sexual activity: Not Asked   Other Topics Concern  . None   Social History Narrative  . None     Family History  Problem Relation Age of Onset  . Cancer Mother 33  . Cancer Father 42  . Heart disease Sister 42    and cancer  . Cancer Brother 92  . Heart attack Brother 24     ROS:   Please see the history of present illness.    ROS All other systems reviewed and are negative.   EKGs/Labs/Other Test Reviewed:    EKG:  EKG is  ordered today.  The ekg ordered today demonstrates NSR, HR 67, normal axis, TWI 1, 2, 3, aVF, V4-6, QTc 450 ms  Recent Labs: 10/07/2016: Magnesium 2.4 10/20/2016: BUN 12; Creatinine, Ser 1.07; Hemoglobin 9.9; Platelets 375; Potassium 3.7; Sodium 138   Recent Lipid Panel    Component Value Date/Time   CHOL 202 (H) 10/06/2016 0242   TRIG 323 (H) 10/06/2016 0242   HDL 48 10/06/2016 0242   CHOLHDL 4.2 10/06/2016 0242   VLDL 65 (H) 10/06/2016 0242   LDLCALC 89 10/06/2016 0242     Physical Exam:    VS:  BP 112/60   Pulse 67   Ht '5\' 7"'$  (1.702 m)   Wt 126 lb 12.8 oz (57.5 kg)   SpO2 96%   BMI 19.86 kg/m     Wt Readings from Last 3 Encounters:  10/24/16 126 lb 12.8 oz (57.5 kg)  10/20/16 129 lb (58.5 kg)  10/12/16 126 lb 4.8 oz (57.3 kg)     Physical Exam   Constitutional: She is oriented to person, place, and time. She appears well-developed and well-nourished. No distress.  HENT:  Head: Normocephalic and atraumatic.  Eyes: No scleral icterus.  Neck: Normal range of motion. No JVD present.  Cardiovascular: Normal rate, regular rhythm, S1 normal, S2 normal and normal heart sounds.   No murmur heard. Pulmonary/Chest: She has  decreased breath sounds. She has no wheezes. She has no rhonchi. She has no rales.  Median sternotomy well healed without erythema or d/c.  Abdominal: Soft. There is no tenderness.  Musculoskeletal: She exhibits no edema.  Neurological: She is alert and oriented to person, place, and time.  Skin: Skin is warm and dry.  Psychiatric: She has a normal mood and affect.    ASSESSMENT:    1. ST elevation myocardial infarction (STEMI) of inferior wall ([place])   2. Coronary artery disease involving native coronary artery of native heart without angina pectoris   3. Persistent atrial fibrillation ([place])   4. Essential hypertension   5. Hyperlipidemia, unspecified hyperlipidemia type    PLAN:    In order of problems listed above:  1. ST elevation myocardial infarction (STEMI) of inferior wall ([facility]) - She is s/p inferior STEMI tx with POBA to the RCA and subsequent CABG.  She is not on Plavix as she is on Coumadin for atrial fibrillation.  She will continue ASA, statin, beta-blocker.  She will start cardiac rehabilitation soon in [place].  2. Coronary artery disease involving native coronary artery of native heart without angina pectoris - s/p Inf STEMI >> CABG.  She is progressing well.  She denies chest pain.  As noted, she will start cardiac rehabilitation soon in [place].  Continue ASA, statin, beta-blocker.   3. Persistent atrial fibrillation ([place]) - She was likely in atrial fibrillation again when she went to the ED.  No recurrent palpitations since that time.  She is tolerating Coumadin.  Will plan on continuing  Amiodarone for 6-8 more weeks.  Will likely plan on an event monitor ~ 3 mos after stopping the Amiodarone to assess for recurrent AFib.  If no AF, consider DCing Coumadin at that time.  CHADS2-VASc=4 (CAD, 71 yo, female, HTN).  If recurrent AF, she will need to remain on anticoagulation long term.     4. Essential hypertension - BP controlled.   5. Hyperlipidemia, unspecified hyperlipidemia type - Previously on PCSK-9 inhibitor through her PCP.  She may have had difficulty with her insurance covering it.  She is now on high dose Lipitor.  Check Lipids and LFTs in 6 weeks.   Dispo:  She prefers to follow up in [facility]. Office.  I will have her see Dr. Sherren Mocha along with me. Return in about 8 weeks (around 12/19/2016) for Routine Follow Up, w/ Dr. Burt Knack or Richardson Dopp, PA-C.   Medication Adjustments/Labs and Tests Ordered: Current medicines are reviewed at length with the patient today.  Concerns regarding medicines are outlined above.  Orders placed this visit:  Orders Placed This Encounter  Procedures  . EKG 12-Lead   Medication changes this visit: No orders of the defined types were placed in this encounter.   Signed, Richardson Dopp, PA-C  10/24/2016 11:09 AM    [facility] [address], [place], [place]  [address] Phone: [phone]; Fax: [phone]

## 2016-10-24 NOTE — Patient Instructions (Addendum)
Medication Instructions:  Your physician recommends that you continue on your current medications as directed. Please refer to the Current Medication list given to you today.  Labwork: PLEASE HAVE FASTING LIPID AN LIVER PANEL DONE WITH PRIMARY 12/04/16 WITH THE RESULTS TO BE FAXED TO [place], [place] OR BRING A COPY TO YOUR APPT ON 12/11/16  Testing/Procedures: Marlynn Perking ORDERED  Follow-Up: Luis Llorons Torres, [facility] 12/11/16 @ 12:15   Any Other Special Instructions Will Be Listed Below (If Applicable).  If you need a refill on your cardiac medications before your next appointment, please call your pharmacy.

## 2016-10-25 DIAGNOSIS — M1990 Unspecified osteoarthritis, unspecified site: Secondary | ICD-10-CM | POA: Diagnosis not present

## 2016-10-25 DIAGNOSIS — E1165 Type 2 diabetes mellitus with hyperglycemia: Secondary | ICD-10-CM | POA: Diagnosis not present

## 2016-11-01 ENCOUNTER — Telehealth: Payer: Self-pay | Admitting: Interventional Cardiology

## 2016-11-01 ENCOUNTER — Telehealth: Payer: Self-pay | Admitting: Physician Assistant

## 2016-11-01 ENCOUNTER — Other Ambulatory Visit: Payer: Self-pay | Admitting: Physician Assistant

## 2016-11-01 DIAGNOSIS — Z7982 Long term (current) use of aspirin: Secondary | ICD-10-CM | POA: Diagnosis not present

## 2016-11-01 DIAGNOSIS — I252 Old myocardial infarction: Secondary | ICD-10-CM | POA: Diagnosis not present

## 2016-11-01 DIAGNOSIS — Z7902 Long term (current) use of antithrombotics/antiplatelets: Secondary | ICD-10-CM | POA: Diagnosis not present

## 2016-11-01 DIAGNOSIS — E785 Hyperlipidemia, unspecified: Secondary | ICD-10-CM | POA: Diagnosis not present

## 2016-11-01 DIAGNOSIS — Z79899 Other long term (current) drug therapy: Secondary | ICD-10-CM | POA: Diagnosis not present

## 2016-11-01 DIAGNOSIS — E119 Type 2 diabetes mellitus without complications: Secondary | ICD-10-CM | POA: Diagnosis not present

## 2016-11-01 DIAGNOSIS — I10 Essential (primary) hypertension: Secondary | ICD-10-CM | POA: Diagnosis not present

## 2016-11-01 DIAGNOSIS — Z955 Presence of coronary angioplasty implant and graft: Secondary | ICD-10-CM | POA: Diagnosis not present

## 2016-11-01 DIAGNOSIS — Z951 Presence of aortocoronary bypass graft: Secondary | ICD-10-CM | POA: Diagnosis not present

## 2016-11-01 DIAGNOSIS — I480 Paroxysmal atrial fibrillation: Secondary | ICD-10-CM | POA: Diagnosis not present

## 2016-11-01 MED ORDER — WARFARIN SODIUM 2.5 MG PO TABS: 2.5000 mg | ORAL_TABLET | ORAL | 1 refills | Status: DC

## 2016-11-01 NOTE — Telephone Encounter (Signed)
Rx done, daughter aware.

## 2016-11-01 NOTE — Telephone Encounter (Signed)
Patient daughter calling (vickie davis) states that the patient's home nurse will no longer check her coumadin levels as the patient's insurance will not cover it. Ms. [name] would like to know how she could go about having her coumadin checked here.

## 2016-11-01 NOTE — Telephone Encounter (Signed)
New Message  Pt c/o medication issue:  1. Name of Medication:  amiodarone 200 mg tablets twice daily warfarin Coumadin 2.5 mg tablet by mouth as directed  2. How are you currently taking this medication (dosage and times per day)? See above  3. Are you having a reaction (difficulty breathing--STAT)? N/A  4. What is your medication issue? Kayla from [facility] voiced wanting to make sure the MD knows about both medications and drug interaction

## 2016-11-01 NOTE — Telephone Encounter (Signed)
°*  STAT* If patient is at the pharmacy, call can be transferred to refill team.   1. Which medications need to be refilled? (please list name of each medication and dose if known) warfarin 2.5 mg  2. Which pharmacy/location (including street and city if local pharmacy) is medication to be sent to?[id number] [place], [place] - [id number]  3. Do they need a 30 day or 90 day supply?

## 2016-11-01 NOTE — Telephone Encounter (Signed)
Spoke with daughter and she is aware that Rx was sent, and [facility] Appt was made for tomorrow. She will also check to see if PCP manages Coumadin.

## 2016-11-02 ENCOUNTER — Ambulatory Visit (INDEPENDENT_AMBULATORY_CARE_PROVIDER_SITE_OTHER): Payer: Medicare Other | Admitting: *Deleted

## 2016-11-02 DIAGNOSIS — I481 Persistent atrial fibrillation: Secondary | ICD-10-CM

## 2016-11-02 DIAGNOSIS — I4819 Other persistent atrial fibrillation: Secondary | ICD-10-CM

## 2016-11-02 DIAGNOSIS — Z5181 Encounter for therapeutic drug level monitoring: Secondary | ICD-10-CM | POA: Diagnosis not present

## 2016-11-02 LAB — POCT INR: INR: 1.5

## 2016-11-02 MED ORDER — WARFARIN SODIUM 2.5 MG PO TABS: ORAL_TABLET | ORAL | 0 refills | Status: DC

## 2016-11-03 DIAGNOSIS — Z951 Presence of aortocoronary bypass graft: Secondary | ICD-10-CM | POA: Diagnosis not present

## 2016-11-03 DIAGNOSIS — Z7902 Long term (current) use of antithrombotics/antiplatelets: Secondary | ICD-10-CM | POA: Diagnosis not present

## 2016-11-03 DIAGNOSIS — E785 Hyperlipidemia, unspecified: Secondary | ICD-10-CM | POA: Diagnosis not present

## 2016-11-03 DIAGNOSIS — Z79899 Other long term (current) drug therapy: Secondary | ICD-10-CM | POA: Diagnosis not present

## 2016-11-03 DIAGNOSIS — I252 Old myocardial infarction: Secondary | ICD-10-CM | POA: Diagnosis not present

## 2016-11-03 DIAGNOSIS — E119 Type 2 diabetes mellitus without complications: Secondary | ICD-10-CM | POA: Diagnosis not present

## 2016-11-03 DIAGNOSIS — I10 Essential (primary) hypertension: Secondary | ICD-10-CM | POA: Diagnosis not present

## 2016-11-03 DIAGNOSIS — I480 Paroxysmal atrial fibrillation: Secondary | ICD-10-CM | POA: Diagnosis not present

## 2016-11-03 DIAGNOSIS — Z7982 Long term (current) use of aspirin: Secondary | ICD-10-CM | POA: Diagnosis not present

## 2016-11-03 DIAGNOSIS — Z955 Presence of coronary angioplasty implant and graft: Secondary | ICD-10-CM | POA: Diagnosis not present

## 2016-11-06 DIAGNOSIS — I252 Old myocardial infarction: Secondary | ICD-10-CM | POA: Diagnosis not present

## 2016-11-06 DIAGNOSIS — I480 Paroxysmal atrial fibrillation: Secondary | ICD-10-CM | POA: Diagnosis not present

## 2016-11-06 DIAGNOSIS — E785 Hyperlipidemia, unspecified: Secondary | ICD-10-CM | POA: Diagnosis not present

## 2016-11-06 DIAGNOSIS — I10 Essential (primary) hypertension: Secondary | ICD-10-CM | POA: Diagnosis not present

## 2016-11-06 DIAGNOSIS — Z79899 Other long term (current) drug therapy: Secondary | ICD-10-CM | POA: Diagnosis not present

## 2016-11-06 DIAGNOSIS — Z7982 Long term (current) use of aspirin: Secondary | ICD-10-CM | POA: Diagnosis not present

## 2016-11-06 DIAGNOSIS — Z955 Presence of coronary angioplasty implant and graft: Secondary | ICD-10-CM | POA: Diagnosis not present

## 2016-11-06 DIAGNOSIS — Z951 Presence of aortocoronary bypass graft: Secondary | ICD-10-CM | POA: Diagnosis not present

## 2016-11-06 DIAGNOSIS — Z7902 Long term (current) use of antithrombotics/antiplatelets: Secondary | ICD-10-CM | POA: Diagnosis not present

## 2016-11-06 DIAGNOSIS — E119 Type 2 diabetes mellitus without complications: Secondary | ICD-10-CM | POA: Diagnosis not present

## 2016-11-08 ENCOUNTER — Ambulatory Visit (INDEPENDENT_AMBULATORY_CARE_PROVIDER_SITE_OTHER): Payer: Medicare Other | Admitting: *Deleted

## 2016-11-08 DIAGNOSIS — Z7982 Long term (current) use of aspirin: Secondary | ICD-10-CM | POA: Diagnosis not present

## 2016-11-08 DIAGNOSIS — Z7902 Long term (current) use of antithrombotics/antiplatelets: Secondary | ICD-10-CM | POA: Diagnosis not present

## 2016-11-08 DIAGNOSIS — I480 Paroxysmal atrial fibrillation: Secondary | ICD-10-CM | POA: Diagnosis not present

## 2016-11-08 DIAGNOSIS — Z951 Presence of aortocoronary bypass graft: Secondary | ICD-10-CM | POA: Diagnosis not present

## 2016-11-08 DIAGNOSIS — I481 Persistent atrial fibrillation: Secondary | ICD-10-CM | POA: Diagnosis not present

## 2016-11-08 DIAGNOSIS — Z955 Presence of coronary angioplasty implant and graft: Secondary | ICD-10-CM | POA: Diagnosis not present

## 2016-11-08 DIAGNOSIS — E119 Type 2 diabetes mellitus without complications: Secondary | ICD-10-CM | POA: Diagnosis not present

## 2016-11-08 DIAGNOSIS — I4819 Other persistent atrial fibrillation: Secondary | ICD-10-CM

## 2016-11-08 DIAGNOSIS — E785 Hyperlipidemia, unspecified: Secondary | ICD-10-CM | POA: Diagnosis not present

## 2016-11-08 DIAGNOSIS — Z5181 Encounter for therapeutic drug level monitoring: Secondary | ICD-10-CM

## 2016-11-08 DIAGNOSIS — I10 Essential (primary) hypertension: Secondary | ICD-10-CM | POA: Diagnosis not present

## 2016-11-08 DIAGNOSIS — Z79899 Other long term (current) drug therapy: Secondary | ICD-10-CM | POA: Diagnosis not present

## 2016-11-08 DIAGNOSIS — I252 Old myocardial infarction: Secondary | ICD-10-CM | POA: Diagnosis not present

## 2016-11-08 LAB — POCT INR: INR: 2.1

## 2016-11-13 DIAGNOSIS — E119 Type 2 diabetes mellitus without complications: Secondary | ICD-10-CM | POA: Diagnosis not present

## 2016-11-13 DIAGNOSIS — E785 Hyperlipidemia, unspecified: Secondary | ICD-10-CM | POA: Diagnosis not present

## 2016-11-13 DIAGNOSIS — Z951 Presence of aortocoronary bypass graft: Secondary | ICD-10-CM | POA: Diagnosis not present

## 2016-11-13 DIAGNOSIS — I10 Essential (primary) hypertension: Secondary | ICD-10-CM | POA: Diagnosis not present

## 2016-11-13 DIAGNOSIS — I480 Paroxysmal atrial fibrillation: Secondary | ICD-10-CM | POA: Diagnosis not present

## 2016-11-13 DIAGNOSIS — Z955 Presence of coronary angioplasty implant and graft: Secondary | ICD-10-CM | POA: Diagnosis not present

## 2016-11-13 DIAGNOSIS — Z79899 Other long term (current) drug therapy: Secondary | ICD-10-CM | POA: Diagnosis not present

## 2016-11-13 DIAGNOSIS — Z7982 Long term (current) use of aspirin: Secondary | ICD-10-CM | POA: Diagnosis not present

## 2016-11-13 DIAGNOSIS — I252 Old myocardial infarction: Secondary | ICD-10-CM | POA: Diagnosis not present

## 2016-11-13 DIAGNOSIS — Z7902 Long term (current) use of antithrombotics/antiplatelets: Secondary | ICD-10-CM | POA: Diagnosis not present

## 2016-11-15 DIAGNOSIS — Z7982 Long term (current) use of aspirin: Secondary | ICD-10-CM | POA: Diagnosis not present

## 2016-11-15 DIAGNOSIS — I480 Paroxysmal atrial fibrillation: Secondary | ICD-10-CM | POA: Diagnosis not present

## 2016-11-15 DIAGNOSIS — Z7902 Long term (current) use of antithrombotics/antiplatelets: Secondary | ICD-10-CM | POA: Diagnosis not present

## 2016-11-15 DIAGNOSIS — Z79899 Other long term (current) drug therapy: Secondary | ICD-10-CM | POA: Diagnosis not present

## 2016-11-15 DIAGNOSIS — E785 Hyperlipidemia, unspecified: Secondary | ICD-10-CM | POA: Diagnosis not present

## 2016-11-15 DIAGNOSIS — I10 Essential (primary) hypertension: Secondary | ICD-10-CM | POA: Diagnosis not present

## 2016-11-15 DIAGNOSIS — E119 Type 2 diabetes mellitus without complications: Secondary | ICD-10-CM | POA: Diagnosis not present

## 2016-11-15 DIAGNOSIS — Z955 Presence of coronary angioplasty implant and graft: Secondary | ICD-10-CM | POA: Diagnosis not present

## 2016-11-15 DIAGNOSIS — Z951 Presence of aortocoronary bypass graft: Secondary | ICD-10-CM | POA: Diagnosis not present

## 2016-11-15 DIAGNOSIS — I252 Old myocardial infarction: Secondary | ICD-10-CM | POA: Diagnosis not present

## 2016-11-16 DIAGNOSIS — I2510 Atherosclerotic heart disease of native coronary artery without angina pectoris: Secondary | ICD-10-CM | POA: Diagnosis not present

## 2016-11-17 ENCOUNTER — Other Ambulatory Visit: Payer: Self-pay | Admitting: Thoracic Surgery (Cardiothoracic Vascular Surgery)

## 2016-11-17 DIAGNOSIS — I10 Essential (primary) hypertension: Secondary | ICD-10-CM | POA: Diagnosis not present

## 2016-11-17 DIAGNOSIS — Z79899 Other long term (current) drug therapy: Secondary | ICD-10-CM | POA: Diagnosis not present

## 2016-11-17 DIAGNOSIS — Z7982 Long term (current) use of aspirin: Secondary | ICD-10-CM | POA: Diagnosis not present

## 2016-11-17 DIAGNOSIS — Z951 Presence of aortocoronary bypass graft: Secondary | ICD-10-CM | POA: Diagnosis not present

## 2016-11-17 DIAGNOSIS — E119 Type 2 diabetes mellitus without complications: Secondary | ICD-10-CM | POA: Diagnosis not present

## 2016-11-17 DIAGNOSIS — I480 Paroxysmal atrial fibrillation: Secondary | ICD-10-CM | POA: Diagnosis not present

## 2016-11-17 DIAGNOSIS — E785 Hyperlipidemia, unspecified: Secondary | ICD-10-CM | POA: Diagnosis not present

## 2016-11-17 DIAGNOSIS — Z955 Presence of coronary angioplasty implant and graft: Secondary | ICD-10-CM | POA: Diagnosis not present

## 2016-11-17 DIAGNOSIS — Z7902 Long term (current) use of antithrombotics/antiplatelets: Secondary | ICD-10-CM | POA: Diagnosis not present

## 2016-11-17 DIAGNOSIS — I252 Old myocardial infarction: Secondary | ICD-10-CM | POA: Diagnosis not present

## 2016-11-21 ENCOUNTER — Ambulatory Visit
Admission: RE | Admit: 2016-11-21 | Discharge: 2016-11-21 | Disposition: A | Discharge status: Home / Self Care | Payer: Medicare Other | Source: Ambulatory Visit | Attending: Thoracic Surgery (Cardiothoracic Vascular Surgery) | Admitting: Thoracic Surgery (Cardiothoracic Vascular Surgery)

## 2016-11-21 ENCOUNTER — Ambulatory Visit (INDEPENDENT_AMBULATORY_CARE_PROVIDER_SITE_OTHER): Payer: Medicare Other | Admitting: *Deleted

## 2016-11-21 ENCOUNTER — Encounter: Payer: Self-pay | Admitting: Thoracic Surgery (Cardiothoracic Vascular Surgery)

## 2016-11-21 ENCOUNTER — Ambulatory Visit (INDEPENDENT_AMBULATORY_CARE_PROVIDER_SITE_OTHER): Payer: Self-pay | Admitting: Thoracic Surgery (Cardiothoracic Vascular Surgery)

## 2016-11-21 ENCOUNTER — Encounter (INDEPENDENT_AMBULATORY_CARE_PROVIDER_SITE_OTHER): Payer: Self-pay

## 2016-11-21 VITALS — BP 133/80 | HR 60 | Resp 20 | Ht 67.0 in | Wt 142.0 lb

## 2016-11-21 DIAGNOSIS — Z951 Presence of aortocoronary bypass graft: Secondary | ICD-10-CM

## 2016-11-21 DIAGNOSIS — Z5181 Encounter for therapeutic drug level monitoring: Secondary | ICD-10-CM

## 2016-11-21 DIAGNOSIS — I481 Persistent atrial fibrillation: Secondary | ICD-10-CM

## 2016-11-21 DIAGNOSIS — I4819 Other persistent atrial fibrillation: Secondary | ICD-10-CM

## 2016-11-21 DIAGNOSIS — I25810 Atherosclerosis of coronary artery bypass graft(s) without angina pectoris: Secondary | ICD-10-CM | POA: Diagnosis not present

## 2016-11-21 LAB — POCT INR: INR: 1.7

## 2016-11-21 NOTE — Progress Notes (Signed)
[address]       [place] [address]             [phone]    HPI: [name] returns today for a scheduled follow-up appointment.  She is a 71 year old woman with a past medical history significant for hypertension, hyperlipidemia, type 2 diabetes, tobacco abuse, and coronary disease. She presented with an unstable coronary syndrome and ruled in for non-ST elevation MI. Catheterization revealed severe three-vessel disease. She underwent coronary bypass grafting 4 on 10/06/2016. She had transient atrial fibrillation on postoperative day #2. That converted to sinus rhythm with amiodarone. She was discharged home on amiodarone. She was seen back in the emergency room about 10 days after discharge with palpitations. She was not having any atrial fibrillation at that time. She was released. She has not had any further issues since that time.  She is not taking any narcotics. She denies incisional pain. She has noted some mild bruising on her right leg. She denies any sternal movement. She denies recurrent chest pain. She is doing cardiac rehabilitation 3 times a week.  Past Medical History:  Diagnosis Date  . Arthritis   . Asthma   . Chest pain   . Diabetes mellitus   . Hypertension   . MI (myocardial infarction) ([place]) 05/2002  . SOB (shortness of breath)   '  Current Outpatient Prescriptions  Medication Sig Dispense Refill  . amiodarone (PACERONE) 200 MG tablet Take 1 tablet (200 mg total) by mouth 2 (two) times daily. Take 200 mg by mouth two times daily for 10 days then take Amiodarone 200 mg by mouth daily thereafter. 60 tablet 1  . aspirin EC 81 MG EC tablet Take 1 tablet (81 mg total) by mouth daily.    Marland Kitchen atorvastatin (LIPITOR) 80 MG tablet Take 1 tablet (80 mg total) by mouth daily at 6 PM. 30 tablet 1  . cholecalciferol (VITAMIN D) 1000 units tablet Take 1,000 Units by mouth daily.     . folic acid (FOLVITE) 1 MG tablet Take 1 mg by mouth daily.    .  hydroxychloroquine (PLAQUENIL) 200 MG tablet Take 200-400 mg by mouth daily as needed (arthritic pain/inflamation).    . metoprolol tartrate (LOPRESSOR) 25 MG tablet Take 1 tablet (25 mg total) by mouth 2 (two) times daily. 60 tablet 1  . oxyCODONE (OXY IR/ROXICODONE) 5 MG immediate release tablet Take 5 mg by mouth every 4-6 hours PRN severe pain. 30 tablet 0  . VICTOZA 18 MG/3ML SOPN INJECT 120 UNITS INTO THE SKIN ONCE DAILY    . warfarin (COUMADIN) 2.5 MG tablet Take as directed by [facility]mad[facility] Clinic 50 tablet 0   No current facility-administered medications for this visit.     Physical Exam BP 133/80   Pulse 60   Resp 20   Ht 5\' 7"  (1.702 m)   Wt 142 lb (64.4 kg)   SpO2 98% Comment: RA  BMI 22.87 kg/m  71 year old no acute distress Alert and oriented 3 with no focal deficits Lungs clear breath sounds bilaterally Cardiac regular rate and rhythm normal S1 and S2 no rubs or murmurs Sternum stable, incision well-healed Leg incisions well healed, small hematoma distal right thigh just above incision No peripheral edema  Diagnostic Tests: I personally reviewed her chest x-ray. It shows no active disease.  Impression: [name] is a 71 year old woman with multiple coronary risk factors and known coronary disease who presented with a non-ST elevation MI in  April. She underwent coronary bypass grafting 4 on 10/06/2016. We had to go into both legs to find sufficient vein, but she did have good targets.  Postoperative atrial fibrillation- transient, converted to sinus rhythm with amiodarone. Will defer amiodarone management to cardiology.  She is now about 6 weeks out from surgery. This point her activities are unrestricted, but she was cautioned to build into new activities gradually. I did encourage her to complete cardiac rehabilitation.  Plan: Follow-up with Richardson Dopp and Dr. Burt Knack.  I will be happy to see her back any time if I can be of any further assistance with her  care.  Melrose Nakayama, MD [facility] [phone]

## 2016-11-22 DIAGNOSIS — Z7902 Long term (current) use of antithrombotics/antiplatelets: Secondary | ICD-10-CM | POA: Diagnosis not present

## 2016-11-22 DIAGNOSIS — I10 Essential (primary) hypertension: Secondary | ICD-10-CM | POA: Diagnosis not present

## 2016-11-22 DIAGNOSIS — I252 Old myocardial infarction: Secondary | ICD-10-CM | POA: Diagnosis not present

## 2016-11-22 DIAGNOSIS — Z955 Presence of coronary angioplasty implant and graft: Secondary | ICD-10-CM | POA: Diagnosis not present

## 2016-11-22 DIAGNOSIS — Z951 Presence of aortocoronary bypass graft: Secondary | ICD-10-CM | POA: Diagnosis not present

## 2016-11-22 DIAGNOSIS — E119 Type 2 diabetes mellitus without complications: Secondary | ICD-10-CM | POA: Diagnosis not present

## 2016-11-22 DIAGNOSIS — I480 Paroxysmal atrial fibrillation: Secondary | ICD-10-CM | POA: Diagnosis not present

## 2016-11-22 DIAGNOSIS — Z79899 Other long term (current) drug therapy: Secondary | ICD-10-CM | POA: Diagnosis not present

## 2016-11-22 DIAGNOSIS — Z7982 Long term (current) use of aspirin: Secondary | ICD-10-CM | POA: Diagnosis not present

## 2016-11-22 DIAGNOSIS — E785 Hyperlipidemia, unspecified: Secondary | ICD-10-CM | POA: Diagnosis not present

## 2016-11-23 DIAGNOSIS — I252 Old myocardial infarction: Secondary | ICD-10-CM | POA: Diagnosis not present

## 2016-11-23 DIAGNOSIS — Z951 Presence of aortocoronary bypass graft: Secondary | ICD-10-CM | POA: Diagnosis not present

## 2016-11-23 DIAGNOSIS — Z7902 Long term (current) use of antithrombotics/antiplatelets: Secondary | ICD-10-CM | POA: Diagnosis not present

## 2016-11-23 DIAGNOSIS — I10 Essential (primary) hypertension: Secondary | ICD-10-CM | POA: Diagnosis not present

## 2016-11-23 DIAGNOSIS — I480 Paroxysmal atrial fibrillation: Secondary | ICD-10-CM | POA: Diagnosis not present

## 2016-11-23 DIAGNOSIS — E119 Type 2 diabetes mellitus without complications: Secondary | ICD-10-CM | POA: Diagnosis not present

## 2016-11-23 DIAGNOSIS — Z7982 Long term (current) use of aspirin: Secondary | ICD-10-CM | POA: Diagnosis not present

## 2016-11-23 DIAGNOSIS — E785 Hyperlipidemia, unspecified: Secondary | ICD-10-CM | POA: Diagnosis not present

## 2016-11-23 DIAGNOSIS — Z955 Presence of coronary angioplasty implant and graft: Secondary | ICD-10-CM | POA: Diagnosis not present

## 2016-11-23 DIAGNOSIS — Z79899 Other long term (current) drug therapy: Secondary | ICD-10-CM | POA: Diagnosis not present

## 2016-11-24 DIAGNOSIS — Z951 Presence of aortocoronary bypass graft: Secondary | ICD-10-CM | POA: Diagnosis not present

## 2016-11-24 DIAGNOSIS — E119 Type 2 diabetes mellitus without complications: Secondary | ICD-10-CM | POA: Diagnosis not present

## 2016-11-24 DIAGNOSIS — I480 Paroxysmal atrial fibrillation: Secondary | ICD-10-CM | POA: Diagnosis not present

## 2016-11-24 DIAGNOSIS — Z7902 Long term (current) use of antithrombotics/antiplatelets: Secondary | ICD-10-CM | POA: Diagnosis not present

## 2016-11-24 DIAGNOSIS — Z79899 Other long term (current) drug therapy: Secondary | ICD-10-CM | POA: Diagnosis not present

## 2016-11-24 DIAGNOSIS — Z7982 Long term (current) use of aspirin: Secondary | ICD-10-CM | POA: Diagnosis not present

## 2016-11-24 DIAGNOSIS — Z955 Presence of coronary angioplasty implant and graft: Secondary | ICD-10-CM | POA: Diagnosis not present

## 2016-11-24 DIAGNOSIS — E785 Hyperlipidemia, unspecified: Secondary | ICD-10-CM | POA: Diagnosis not present

## 2016-11-24 DIAGNOSIS — I10 Essential (primary) hypertension: Secondary | ICD-10-CM | POA: Diagnosis not present

## 2016-11-24 DIAGNOSIS — I252 Old myocardial infarction: Secondary | ICD-10-CM | POA: Diagnosis not present

## 2016-11-27 NOTE — Anesthesia Postprocedure Evaluation (Signed)
Anesthesia Post Note  Patient: [name]  Procedure(s) Performed: Procedure(s) (LRB): CORONARY ARTERY BYPASS GRAFTING (CABG) x four, using internal mammary, and bilateral saphenous veins harvested endoscopically (N/A) TRANSESOPHAGEAL ECHOCARDIOGRAM (TEE) (N/A)     Anesthesia Post Evaluation  Last Vitals:  Vitals:   10/12/16 0149 10/12/16 1042  BP: 133/67 126/60  Pulse: 61 71  Resp: 18   Temp: 36.7 C     Last Pain:  Vitals:   10/12/16 0800  TempSrc:   PainSc: 0-No pain                 Marae Cottrell S

## 2016-11-27 NOTE — Addendum Note (Signed)
Addendum  created 11/27/16 1318 by Myrtie Soman, MD   Sign clinical note

## 2016-11-28 DIAGNOSIS — C4442 Squamous cell carcinoma of skin of scalp and neck: Secondary | ICD-10-CM | POA: Diagnosis not present

## 2016-11-28 DIAGNOSIS — L570 Actinic keratosis: Secondary | ICD-10-CM | POA: Diagnosis not present

## 2016-11-28 DIAGNOSIS — L578 Other skin changes due to chronic exposure to nonionizing radiation: Secondary | ICD-10-CM | POA: Diagnosis not present

## 2016-11-28 DIAGNOSIS — L219 Seborrheic dermatitis, unspecified: Secondary | ICD-10-CM | POA: Diagnosis not present

## 2016-12-04 ENCOUNTER — Ambulatory Visit (INDEPENDENT_AMBULATORY_CARE_PROVIDER_SITE_OTHER): Payer: Medicare Other | Admitting: *Deleted

## 2016-12-04 ENCOUNTER — Other Ambulatory Visit: Payer: Self-pay | Admitting: Physician Assistant

## 2016-12-04 ENCOUNTER — Other Ambulatory Visit: Payer: Self-pay | Admitting: Interventional Cardiology

## 2016-12-04 DIAGNOSIS — E785 Hyperlipidemia, unspecified: Secondary | ICD-10-CM | POA: Diagnosis not present

## 2016-12-04 DIAGNOSIS — I2510 Atherosclerotic heart disease of native coronary artery without angina pectoris: Secondary | ICD-10-CM | POA: Diagnosis not present

## 2016-12-04 DIAGNOSIS — I481 Persistent atrial fibrillation: Secondary | ICD-10-CM

## 2016-12-04 DIAGNOSIS — Z5181 Encounter for therapeutic drug level monitoring: Secondary | ICD-10-CM

## 2016-12-04 DIAGNOSIS — I4819 Other persistent atrial fibrillation: Secondary | ICD-10-CM

## 2016-12-04 LAB — POCT INR: INR: 2

## 2016-12-07 ENCOUNTER — Other Ambulatory Visit: Payer: Self-pay | Admitting: Physician Assistant

## 2016-12-08 ENCOUNTER — Other Ambulatory Visit: Payer: Self-pay | Admitting: Physician Assistant

## 2016-12-11 ENCOUNTER — Ambulatory Visit: Payer: Medicare Other | Admitting: Physician Assistant

## 2016-12-11 ENCOUNTER — Other Ambulatory Visit: Payer: Self-pay | Admitting: Physician Assistant

## 2016-12-11 ENCOUNTER — Other Ambulatory Visit: Payer: Self-pay

## 2016-12-11 ENCOUNTER — Encounter: Payer: Self-pay | Admitting: Physician Assistant

## 2016-12-11 ENCOUNTER — Ambulatory Visit (INDEPENDENT_AMBULATORY_CARE_PROVIDER_SITE_OTHER): Payer: Medicare Other | Admitting: Physician Assistant

## 2016-12-11 ENCOUNTER — Encounter (INDEPENDENT_AMBULATORY_CARE_PROVIDER_SITE_OTHER): Payer: Self-pay

## 2016-12-11 VITALS — BP 138/70 | HR 62 | Ht 67.0 in | Wt 137.0 lb

## 2016-12-11 DIAGNOSIS — I2510 Atherosclerotic heart disease of native coronary artery without angina pectoris: Secondary | ICD-10-CM | POA: Diagnosis not present

## 2016-12-11 DIAGNOSIS — E785 Hyperlipidemia, unspecified: Secondary | ICD-10-CM | POA: Diagnosis not present

## 2016-12-11 DIAGNOSIS — I4819 Other persistent atrial fibrillation: Secondary | ICD-10-CM

## 2016-12-11 DIAGNOSIS — I10 Essential (primary) hypertension: Secondary | ICD-10-CM | POA: Diagnosis not present

## 2016-12-11 DIAGNOSIS — E119 Type 2 diabetes mellitus without complications: Secondary | ICD-10-CM

## 2016-12-11 DIAGNOSIS — I481 Persistent atrial fibrillation: Secondary | ICD-10-CM

## 2016-12-11 MED ORDER — ATORVASTATIN CALCIUM 80 MG PO TABS
80.0000 mg | ORAL_TABLET | Freq: Every day | ORAL | 11 refills | Status: DC
Start: 2016-12-11 — End: 2017-06-01

## 2016-12-11 NOTE — Patient Instructions (Signed)
Medication Instructions:  1. STOP AMIODARONE AS OF TODAY  Labwork: NONE ORDERED  Testing/Procedures: Your physician has recommended that you wear an event monitor. Event monitors are medical devices that record the heart's electrical activity. Doctors most often [place] these monitors to diagnose arrhythmias. Arrhythmias are problems with the speed or rhythm of the heartbeat. The monitor is a small, portable device. You can wear one while you do your normal daily activities. This is usually used to diagnose what is causing palpitations/syncope (passing out). THIS IS TO BE PUT ON 03/13/17 OR LATER.    Follow-Up: DR. Burt Knack 5 MONTHS ; WE WILL SEND OUT A REMINDER LETTER FOR YOU TO CALL AND MAKE AN APPT  Any Other Special Instructions Will Be Listed Below (If Applicable).     If you need a refill on your cardiac medications before your next appointment, please call your pharmacy.

## 2016-12-11 NOTE — Progress Notes (Signed)
Cardiology Office Note:    Date:  12/11/2016   ID:  [name], DOB 14-Aug-1945, MRN [id number]  PCP:  Townsend Roger, MD  Cardiologist:  Dr. Kathlyn Sacramento >> Dr. Sherren Mocha    Referring MD: Townsend Roger, MD   Chief Complaint  Patient presents with  . Coronary Artery Disease    follow up    History of Present Illness:    [name] is a 71 y.o. female with a hx of CAD status post multiple PCIs to the RCA, LAD and LCx in the past, diabetes, HTN, HL.  She saw Dr. Kathlyn Sacramento in the past and last saw him in 2012.  She was admitted in 09/2016 with an inferior STEMI with associated AF with RVR.  She underwent emergent LHC which demonstrated late stent thrombosis of the RCA that was tx with POBA.  Because of diffuse ISR of the stents in the LAD and LCx, she underwent CABG (L-LAD, S-D1, S-OM, S-PDA).  Her post op course was c/b recurrent AF with RVR treated with Amiodarone and coumadin.  She was last seen 5/18.    Ms. Rajagopalan returns for follow-up. She is here with her daughter. Since last seen, she has been doing well. She denies chest discomfort, shortness of breath, syncope, orthopnea, PND or edema. She has been going to cardiac rehabilitation. She has doing well with this without significant limitations. She denies recurrent palpitations.  CHADS2-VASc=4   Prior CV studies:   The following studies were reviewed today:  Intraoperative TEE 10/06/16 EF 50-55, normal wall motion  Carotid US 10/04/16 Bilateral ICA 1-39  LHC 10/01/16 LAD proximal 60 ISR, distal 80 OM1 50 ISR, OM2 95, lateral OM 100 in the stent RCA proximal 99 ISR EF 50-55 PCI: POBA to the mid RCA >> CABG  Past Medical History:  Diagnosis Date  . Arthritis   . Asthma   . CAD (coronary artery disease) 09/09/2010   S/p multiple PCIs to RCA, LAD, LCx // s/p inf STEMI 4/18 >> POBA to mRCA followed by CABG (L-LAD, S-D1, S-OM, S-PDA) // Intraoperative TEE 4/18: EF 50-55, no RWMA  . Diabetes  mellitus   . History of MI (myocardial infarction) 05/2002  . Hyperlipidemia 09/09/2010      . Hypertension   . Persistent atrial fibrillation ([place]) 10/01/2016   Post op AFib after CABG 4/18 // Amiodarone and Coumadin started // Amiodarone stopped 11/2016    Past Surgical History:  Procedure Laterality Date  . CHOLECYSTECTOMY    . CORONARY ARTERY BYPASS GRAFT N/A 10/06/2016   Procedure: CORONARY ARTERY BYPASS GRAFTING (CABG) x four, using internal mammary, and bilateral saphenous veins harvested endoscopically;  Surgeon: Melrose Nakayama, MD;  Location: [facility];  Service: Open Heart Surgery;  Laterality: N/A;  . CORONARY BALLOON ANGIOPLASTY N/A 10/01/2016   Procedure: Coronary Balloon Angioplasty;  Surgeon: Belva Crome, MD;  Location: [facility];  Service: Cardiovascular;  Laterality: N/A;  . LEFT HEART CATH AND CORONARY ANGIOGRAPHY N/A 10/01/2016   Procedure: Left Heart Cath and Coronary Angiography;  Surgeon: Belva Crome, MD;  Location: [facility];  Service: Cardiovascular;  Laterality: N/A;  . TEE WITHOUT CARDIOVERSION N/A 10/06/2016   Procedure: TRANSESOPHAGEAL ECHOCARDIOGRAM (TEE);  Surgeon: Melrose Nakayama, MD;  Location: [facility];  Service: Open Heart Surgery;  Laterality: N/A;    Current Medications: Current Meds  Medication Sig  . aspirin EC 81 MG EC tablet Take 1 tablet (81  mg total) by mouth daily.  Marland Kitchen atorvastatin (LIPITOR) 80 MG tablet Take 1 tablet (80 mg total) by mouth daily at 6 PM.  . cholecalciferol (VITAMIN D) 1000 units tablet Take 1,000 Units by mouth daily.   . folic acid (FOLVITE) 1 MG tablet Take 1 mg by mouth daily.  . hydroxychloroquine (PLAQUENIL) 200 MG tablet Take 200-400 mg by mouth daily as needed (arthritic pain/inflamation).  . metoprolol tartrate (LOPRESSOR) 25 MG tablet Take 1 tablet (25 mg total) by mouth 2 (two) times daily.  Marland Kitchen VICTOZA 18 MG/3ML SOPN INJECT 120 UNITS INTO THE SKIN ONCE DAILY  . warfarin (COUMADIN) 2.5 MG tablet TAKE  AS DIRECTED BY COUMADIN CLINIC  . [DISCONTINUED] amiodarone (PACERONE) 200 MG tablet Take 1 tablet (200 mg total) by mouth 2 (two) times daily. Take 200 mg by mouth two times daily for 10 days then take Amiodarone 200 mg by mouth daily thereafter. (Patient taking differently: Take 200 mg by mouth daily. )  . [DISCONTINUED] amiodarone (PACERONE) 200 MG tablet Take 200 mg by mouth daily.     Allergies:   Ergocalciferol   Social History   Social History  . Marital status: Divorced    Spouse name: N/A  . Number of children: N/A  . Years of education: N/A   Social History Main Topics  . Smoking status: Current Every Day Smoker    Packs/day: 1.00    Years: 25.00    Types: Cigarettes  . Smokeless tobacco: Never Used  . Alcohol use No  . Drug use: No  . Sexual activity: Not Asked   Other Topics Concern  . None   Social History Narrative  . None     Family Hx: The patient's family history includes Cancer (age of onset: 11) in her mother; Cancer (age of onset: 45) in her brother; Cancer (age of onset: 68) in her father; Heart attack (age of onset: 68) in her brother; Heart disease (age of onset: 5) in her sister.  ROS:   Please see the history of present illness.    Review of Systems  Cardiovascular: Positive for palpitations.   All other systems reviewed and are negative.   EKGs/Labs/Other Test Reviewed:    EKG:  EKG is  ordered today.  The ekg ordered today demonstrates NSR, HR 62, normal axis, QTc 497 ms  Recent Labs: 10/07/2016: Magnesium 2.4 10/20/2016: BUN 12; Creatinine, Ser 1.07; Hemoglobin 9.9; Platelets 375; Potassium 3.7; Sodium 138   Labs from PCP 12/04/16: TC 148, TG 253, HDL 51, LDL 46, ALT 16  Recent Lipid Panel Lab Results  Component Value Date/Time   CHOL 202 (H) 10/06/2016 02:42 AM   TRIG 323 (H) 10/06/2016 02:42 AM   HDL 48 10/06/2016 02:42 AM   CHOLHDL 4.2 10/06/2016 02:42 AM   LDLCALC 89 10/06/2016 02:42 AM    Physical Exam:    VS:  BP 138/70    Pulse 62   Ht 5\' 7"  (1.702 m)   Wt 137 lb (62.1 kg)   BMI 21.46 kg/m     Wt Readings from Last 3 Encounters:  12/11/16 137 lb (62.1 kg)  11/21/16 142 lb (64.4 kg)  10/24/16 126 lb 12.8 oz (57.5 kg)     Physical Exam  Constitutional: She is oriented to person, place, and time. She appears well-developed and well-nourished. No distress.  HENT:  Head: Normocephalic and atraumatic.  Eyes: No scleral icterus.  Neck: Normal range of motion. No JVD present.  Cardiovascular: Normal rate, regular rhythm,  S1 normal, S2 normal and normal heart sounds.   No murmur heard. Pulmonary/Chest: Breath sounds normal. She has no wheezes. She has no rhonchi. She has no rales.  Abdominal: Soft. There is no tenderness.  Musculoskeletal: She exhibits no edema.  Neurological: She is alert and oriented to person, place, and time.  Skin: Skin is warm and dry.  Psychiatric: She has a normal mood and affect.    ASSESSMENT:    1. Coronary artery disease involving native coronary artery of native heart without angina pectoris   2. Persistent atrial fibrillation ([place])   3. Essential hypertension   4. Hyperlipidemia, unspecified hyperlipidemia type   5. Type 2 diabetes mellitus without complication, without long-term current use of insulin (HCC)    PLAN:    In order of problems listed above:  1. Coronary artery disease involving native coronary artery of native heart without angina pectoris -  History of multiple PCI's to the RCA, LCx and LAD. She suffered inferior STEMI in 4/18 followed by CABG. She has been going to cardiac rehabilitation. She is doing very well without chest discomfort or shortness of breath. Continue aspirin, statin, beta blocker.  I will have her see Dr. Burt Knack next time to establish and I can see her after that.    2. Persistent atrial fibrillation ([place]) -  She had recurrent atrial fibrillation after CABG. She was placed on amiodarone and Coumadin. She's maintaining normal sinus  rhythm. It is been 2 months since her CABG.  -  DC amiodarone  -  Arrange of monitor in 3 months to assess for recurrent atrial fibrillation  -  If no atrial fibrillation on event monitor, consider stopping Coumadin at next visit  3. Essential hypertension - The patient's blood pressure is controlled on her current regimen.  Continue current therapy.    4. Hyperlipidemia, unspecified hyperlipidemia type LDL optimal on most recent lab work.  Continue current Rx.    5. Type 2 diabetes mellitus without complication, without long-term current use of insulin ([place]) She's had some hypoglycemic episodes after exercise. I have asked her to follow-up with primary care for further management.   Dispo:  Return in about 5 months (around 05/13/2017) for Routine Follow Up, Follow up after testing, w/ Dr. Burt Knack.   Medication Adjustments/Labs and Tests Ordered: Current medicines are reviewed at length with the patient today.  Concerns regarding medicines are outlined above.  Orders/Tests:  Orders Placed This Encounter  Procedures  . Cardiac event monitor  . EKG 12-Lead   Medication changes: No orders of the defined types were placed in this encounter.  Signed, Richardson Dopp, PA-C  12/11/2016 3:01 PM    [facility] [address], [place], [place]  [address] Phone: [phone]; Fax: [phone]

## 2016-12-18 ENCOUNTER — Other Ambulatory Visit: Payer: Self-pay | Admitting: Physician Assistant

## 2016-12-25 ENCOUNTER — Ambulatory Visit (INDEPENDENT_AMBULATORY_CARE_PROVIDER_SITE_OTHER): Payer: Medicare Other | Admitting: *Deleted

## 2016-12-25 DIAGNOSIS — Z5181 Encounter for therapeutic drug level monitoring: Secondary | ICD-10-CM | POA: Diagnosis not present

## 2016-12-25 DIAGNOSIS — I4819 Other persistent atrial fibrillation: Secondary | ICD-10-CM

## 2016-12-25 DIAGNOSIS — I481 Persistent atrial fibrillation: Secondary | ICD-10-CM

## 2016-12-25 LAB — POCT INR: INR: 1.5

## 2017-01-01 ENCOUNTER — Ambulatory Visit (INDEPENDENT_AMBULATORY_CARE_PROVIDER_SITE_OTHER): Payer: Medicare Other | Admitting: *Deleted

## 2017-01-01 DIAGNOSIS — I481 Persistent atrial fibrillation: Secondary | ICD-10-CM | POA: Diagnosis not present

## 2017-01-01 DIAGNOSIS — I4819 Other persistent atrial fibrillation: Secondary | ICD-10-CM

## 2017-01-01 DIAGNOSIS — Z5181 Encounter for therapeutic drug level monitoring: Secondary | ICD-10-CM | POA: Diagnosis not present

## 2017-01-01 LAB — POCT INR: INR: 1.8

## 2017-01-15 ENCOUNTER — Ambulatory Visit (INDEPENDENT_AMBULATORY_CARE_PROVIDER_SITE_OTHER): Payer: Medicare Other

## 2017-01-15 DIAGNOSIS — I481 Persistent atrial fibrillation: Secondary | ICD-10-CM | POA: Diagnosis not present

## 2017-01-15 DIAGNOSIS — I4819 Other persistent atrial fibrillation: Secondary | ICD-10-CM

## 2017-01-15 DIAGNOSIS — Z5181 Encounter for therapeutic drug level monitoring: Secondary | ICD-10-CM | POA: Diagnosis not present

## 2017-01-15 LAB — POCT INR: INR: 2

## 2017-01-24 DIAGNOSIS — I10 Essential (primary) hypertension: Secondary | ICD-10-CM | POA: Diagnosis not present

## 2017-01-24 DIAGNOSIS — E119 Type 2 diabetes mellitus without complications: Secondary | ICD-10-CM | POA: Diagnosis not present

## 2017-01-24 DIAGNOSIS — I4891 Unspecified atrial fibrillation: Secondary | ICD-10-CM | POA: Diagnosis not present

## 2017-01-24 DIAGNOSIS — E7800 Pure hypercholesterolemia, unspecified: Secondary | ICD-10-CM | POA: Diagnosis not present

## 2017-01-24 DIAGNOSIS — R112 Nausea with vomiting, unspecified: Secondary | ICD-10-CM | POA: Diagnosis not present

## 2017-01-25 ENCOUNTER — Telehealth: Payer: Self-pay | Admitting: Pharmacist

## 2017-01-25 NOTE — Telephone Encounter (Signed)
Pt's daughter called and states her cardiologist Vassie Loll Eyke with [facility] in [place] can check pt's Coumadin and that this would be more convenient for pt.  Called Dr Burnett Harry office [phone] to confirm and they stated they could check INR but we would have to dose it. Advised that our practice would not do this and was told that they can manage INRs too.  Verbal agreement from [facility] regarding switching full Coumadin management over to [facility].  Will fax most recent office note and INR check to Luellen Pucker [phone].  Pt will keep f/u INR check with [place] on 8/13. Daughter asked that we provide her with update from Dr Burnett Harry office at this visit to ensure smooth anticoagulation transition to [facility].

## 2017-01-29 ENCOUNTER — Other Ambulatory Visit: Payer: Self-pay | Admitting: Interventional Cardiology

## 2017-01-31 ENCOUNTER — Other Ambulatory Visit: Payer: Self-pay | Admitting: Interventional Cardiology

## 2017-02-05 DIAGNOSIS — I4891 Unspecified atrial fibrillation: Secondary | ICD-10-CM | POA: Diagnosis not present

## 2017-02-13 ENCOUNTER — Other Ambulatory Visit: Payer: Self-pay | Admitting: Interventional Cardiology

## 2017-03-27 ENCOUNTER — Ambulatory Visit (INDEPENDENT_AMBULATORY_CARE_PROVIDER_SITE_OTHER): Payer: Medicare Other

## 2017-03-27 DIAGNOSIS — I481 Persistent atrial fibrillation: Secondary | ICD-10-CM

## 2017-03-27 DIAGNOSIS — I4891 Unspecified atrial fibrillation: Secondary | ICD-10-CM | POA: Diagnosis not present

## 2017-03-27 DIAGNOSIS — I4819 Other persistent atrial fibrillation: Secondary | ICD-10-CM

## 2017-04-12 ENCOUNTER — Telehealth: Payer: Self-pay

## 2017-04-12 NOTE — Telephone Encounter (Signed)
Kathlee Nations with [employer] called with critical monitor. Patient has atril flutter with HR 110 at 04/11/17 at 10:55 PM CT. Called patient to see how she is doing. Left message for patient to call back. Consulted DOD, Dr. Tamala Julian, no changes recommended at this time.

## 2017-04-27 ENCOUNTER — Other Ambulatory Visit: Payer: Self-pay | Admitting: Interventional Cardiology

## 2017-05-03 DIAGNOSIS — I4891 Unspecified atrial fibrillation: Secondary | ICD-10-CM | POA: Diagnosis not present

## 2017-05-03 DIAGNOSIS — E119 Type 2 diabetes mellitus without complications: Secondary | ICD-10-CM | POA: Diagnosis not present

## 2017-05-03 DIAGNOSIS — Z23 Encounter for immunization: Secondary | ICD-10-CM | POA: Diagnosis not present

## 2017-05-07 DIAGNOSIS — M79643 Pain in unspecified hand: Secondary | ICD-10-CM | POA: Diagnosis not present

## 2017-05-07 DIAGNOSIS — Z79899 Other long term (current) drug therapy: Secondary | ICD-10-CM | POA: Diagnosis not present

## 2017-05-07 DIAGNOSIS — M2550 Pain in unspecified joint: Secondary | ICD-10-CM | POA: Diagnosis not present

## 2017-05-07 DIAGNOSIS — M1990 Unspecified osteoarthritis, unspecified site: Secondary | ICD-10-CM | POA: Diagnosis not present

## 2017-05-07 DIAGNOSIS — R768 Other specified abnormal immunological findings in serum: Secondary | ICD-10-CM | POA: Diagnosis not present

## 2017-05-30 ENCOUNTER — Ambulatory Visit (INDEPENDENT_AMBULATORY_CARE_PROVIDER_SITE_OTHER): Payer: Medicare Other | Admitting: Cardiovascular Disease

## 2017-05-30 ENCOUNTER — Encounter: Payer: Self-pay | Admitting: Cardiovascular Disease

## 2017-05-30 VITALS — BP 134/74 | HR 93 | Ht 67.0 in | Wt 141.0 lb

## 2017-05-30 DIAGNOSIS — E785 Hyperlipidemia, unspecified: Secondary | ICD-10-CM | POA: Diagnosis not present

## 2017-05-30 DIAGNOSIS — I2510 Atherosclerotic heart disease of native coronary artery without angina pectoris: Secondary | ICD-10-CM | POA: Diagnosis not present

## 2017-05-30 NOTE — Progress Notes (Signed)
Cardiology Office Note Date:  05/30/2017   ID:  Raven, [name] 03-Jun-1946, MRN [id number]  PCP:  Townsend Roger, MD  Cardiologist:  Sherren Mocha, MD    Chief Complaint  Patient presents with  . Fatigue   History of Present Illness: [name] is a 71 y.o. female who presents for follow-up of CAD.  The patient has undergone multiple PCI procedures in the past.  In April 2018 she presented with an inferior STEMI secondary to stent thrombosis and was treated with balloon angioplasty she ultimately underwent CABG because she had diffuse in-stent restenosis in all of her major coronary vessels.  Her postoperative course was complicated with atrial fibrillation with RVR and she was treated with amiodarone and started on warfarin.  She was last seen here in June 2018.  At that time amiodarone was discontinued and an event monitor was recommended.  The patient is here alone today.  She chest pain or pressure.  States that she really has not felt well since bypass surgery.  She complains of generalized fatigue and lack of energy.  Mild shortness of breath with activity.  No leg swelling, orthopnea, or PND.  She has occasional heart palpitations.   Past Medical History:  Diagnosis Date  . Arthritis   . Asthma   . CAD (coronary artery disease) 09/09/2010   S/p multiple PCIs to RCA, LAD, LCx // s/p inf STEMI 4/18 >> POBA to mRCA followed by CABG (L-LAD, S-D1, S-OM, S-PDA) // Intraoperative TEE 4/18: EF 50-55, no RWMA  . Diabetes mellitus   . History of MI (myocardial infarction) 05/2002  . Hyperlipidemia 09/09/2010      . Hypertension   . Persistent atrial fibrillation ([place]) 10/01/2016   Post op AFib after CABG 4/18 // Amiodarone and Coumadin started // Amiodarone stopped 11/2016    Past Surgical History:  Procedure Laterality Date  . CHOLECYSTECTOMY    . CORONARY ARTERY BYPASS GRAFT N/A 10/06/2016   Procedure: CORONARY ARTERY BYPASS GRAFTING (CABG) x four, using internal  mammary, and bilateral saphenous veins harvested endoscopically;  Surgeon: Melrose Nakayama, MD;  Location: [facility];  Service: Open Heart Surgery;  Laterality: N/A;  . CORONARY BALLOON ANGIOPLASTY N/A 10/01/2016   Procedure: Coronary Balloon Angioplasty;  Surgeon: Belva Crome, MD;  Location: [facility];  Service: Cardiovascular;  Laterality: N/A;  . LEFT HEART CATH AND CORONARY ANGIOGRAPHY N/A 10/01/2016   Procedure: Left Heart Cath and Coronary Angiography;  Surgeon: Belva Crome, MD;  Location: [facility];  Service: Cardiovascular;  Laterality: N/A;  . TEE WITHOUT CARDIOVERSION N/A 10/06/2016   Procedure: TRANSESOPHAGEAL ECHOCARDIOGRAM (TEE);  Surgeon: Melrose Nakayama, MD;  Location: [facility];  Service: Open Heart Surgery;  Laterality: N/A;    Current Outpatient Medications  Medication Sig Dispense Refill  . aspirin EC 81 MG EC tablet Take 1 tablet (81 mg total) by mouth daily.    Marland Kitchen atorvastatin (LIPITOR) 80 MG tablet Take 1 tablet (80 mg total) by mouth daily at 6 PM. 30 tablet 11  . cholecalciferol (VITAMIN D) 1000 units tablet Take 1,000 Units by mouth daily.     . folic acid (FOLVITE) 1 MG tablet Take 1 mg by mouth daily.    . hydroxychloroquine (PLAQUENIL) 200 MG tablet Take 200-400 mg by mouth daily as needed (arthritic pain/inflamation).    . metoprolol tartrate (LOPRESSOR) 25 MG tablet Take 1 tablet (25 mg total) by mouth 2 (two) times daily.  60 tablet 1  . metoprolol tartrate (LOPRESSOR) 25 MG tablet Take 1 tablet (25 mg total) by mouth 2 (two) times daily. 60 tablet 11  . VICTOZA 18 MG/3ML SOPN INJECT 120 UNITS INTO THE SKIN ONCE DAILY    . warfarin (COUMADIN) 2.5 MG tablet TAKE AS DIRECTED BY COUMADIN CLINIC 50 tablet 1  . warfarin (COUMADIN) 2.5 MG tablet TAKE 1 TABLET (2.5 MG TOTAL) BY MOUTH AS DIRECTED. OR AS DIRECTED BY COUMADIN CLINIC 50 tablet 2  . warfarin (COUMADIN) 2.5 MG tablet TAKE 1 TABLET (2.5 MG TOTAL) BY MOUTH AS DIRECTED. OR AS DIRECTED BY COUMADIN  CLINIC 50 tablet 2   No current facility-administered medications for this visit.     Allergies:   Ergocalciferol   Social History:  The patient  reports that she has been smoking cigarettes.  She has a 25.00 pack-year smoking history. she has never used smokeless tobacco. She reports that she does not drink alcohol or use drugs.   Family History:  The patient's  family history includes Cancer (age of onset: 31) in her mother; Cancer (age of onset: 2) in her brother; Cancer (age of onset: 10) in her father; Heart attack (age of onset: 34) in her brother; Heart disease (age of onset: 15) in her sister.    ROS:  Please see the history of present illness.  All other systems are reviewed and negative.    PHYSICAL EXAM: VS:  BP 134/74   Pulse 93   Ht 5\' 7"  (1.702 m)   Wt 141 lb (64 kg)   SpO2 95%   BMI 22.08 kg/m  , BMI Body mass index is 22.08 kg/m. GEN: Well nourished, well developed, in no acute distress  HEENT: normal  Neck: no JVD, no masses. No carotid bruits Cardiac: RRR without murmur or gallop                Respiratory:  clear to auscultation bilaterally, normal work of breathing GI: soft, nontender, nondistended, + BS MS: no deformity or atrophy  Ext: no pretibial edema, pedal pulses 2+= bilaterally Skin: warm and dry, no rash Neuro:  Strength and sensation are intact Psych: euthymic mood, full affect  EKG:  EKG is not ordered today.  Recent Labs: 10/07/2016: Magnesium 2.4 10/20/2016: BUN 12; Creatinine, Ser 1.07; Hemoglobin 9.9; Platelets 375; Potassium 3.7; Sodium 138   Lipid Panel     Component Value Date/Time   CHOL 202 (H) 10/06/2016 0242   TRIG 323 (H) 10/06/2016 0242   HDL 48 10/06/2016 0242   CHOLHDL 4.2 10/06/2016 0242   VLDL 65 (H) 10/06/2016 0242   LDLCALC 89 10/06/2016 0242      Wt Readings from Last 3 Encounters:  05/30/17 141 lb (64 kg)  12/11/16 137 lb (62.1 kg)  11/21/16 142 lb (64.4 kg)     Cardiac Studies Reviewed: Cardiac Event  Monitor: Study Highlights   Periods of atrial flutter with variable conduction  Chart reviewed. Pt anticoagulated with warfarin. Study done to assess whether she needs to remain on warfarin. Would continue anticoagulation and follow-up as planned.    ASSESSMENT AND PLAN: 1.  Coronary artery disease, native vessel, without angina: Patient is now status post multivessel CABG in April 2018.  She continues on aspirin, statin drug, and a beta-blocker.  2.  Persistent atrial fibrillation: CHADS-Vasc = 5. Amiodarone was discontinued in June.  An event monitor demonstrates sinus rhythm with paroxysms of atrial fib/flutter.  She will continue on oral anticoagulation.  The  patient did not bring her medicines today.  However, she states that she no longer takes warfarin.  She never was able to get her INR into a.  She does not know which medicine she was switched to.  I have asked her to call in with her medicines when she gets home so that we can figure out which oral anticoagulant drug she is taking.  She will continue on metoprolol.  3. Hyperlipidemia: Treated with atorvastatin 80 mg daily.  4.  Hypertension: Blood pressure is controlled with metoprolol.  5.  Hyperlipidemia: Treated with a high intensity statin drug.  6.  Type 2 diabetes: managed by her PCP.  Current medicines are reviewed with the patient today.  The patient does not have concerns regarding medicines.  Labs/ tests ordered today include:  No orders of the defined types were placed in this encounter.   Disposition:   FU 6 months with Richardson Dopp, PA-C.   Signed, Sherren Mocha, MD  05/30/2017 3:47 PM    [facility] [address], [place], [place]  [address] Phone: [phone]; Fax: [phone]

## 2017-05-30 NOTE — Patient Instructions (Signed)
Medication Instructions:  Please collect your medications when you get home and call to confirm what you are taking.  Labwork: None  Testing/Procedures: None  Follow-Up: Your provider recommends that you schedule a follow-up appointment in: 3 months with Richardson Dopp.  Any Other Special Instructions Will Be Listed Below (If Applicable).     If you need a refill on your cardiac medications before your next appointment, please call your pharmacy.

## 2017-06-01 ENCOUNTER — Telehealth: Payer: Self-pay

## 2017-06-01 NOTE — Telephone Encounter (Addendum)
Called patient to verify medications (she was unable to adequately confirm at OV this week). Patient is currently ONLY taking: 1) folic acid 1 mg daily 2) prednisone 10 mg daily 3) Xarelto 20 mg daily 4) Plaquenil 200 mg daily  Med list updated. (ASA 81 mg qd removed, Lipitor 80 mg qd removed, Vitamin D 1000 units PO qd removed, Lopressor 25 mg BID removed, Victoza removed, and warfarin removed).   OV note requested and received from Dr. Doran Durand office as well (placed in Dr. Antionette Char folder). Patient understands she will be called if Dr. Burt Knack has further medication instructions.

## 2017-06-02 NOTE — Telephone Encounter (Signed)
Reviewed - appropriate medication for anticoagulation in setting PAF. No changes recommended. thanks

## 2017-06-06 NOTE — Telephone Encounter (Signed)
Informed patient Dr. Burt Knack reviewed medications and no changes are being made. She was grateful for call and understands to contact the office if any changes are made by other doctors.

## 2017-07-21 DIAGNOSIS — I509 Heart failure, unspecified: Secondary | ICD-10-CM | POA: Diagnosis not present

## 2017-07-21 DIAGNOSIS — M542 Cervicalgia: Secondary | ICD-10-CM | POA: Diagnosis not present

## 2017-07-21 DIAGNOSIS — R05 Cough: Secondary | ICD-10-CM | POA: Diagnosis not present

## 2017-07-21 DIAGNOSIS — I110 Hypertensive heart disease with heart failure: Secondary | ICD-10-CM | POA: Diagnosis not present

## 2017-07-21 DIAGNOSIS — J440 Chronic obstructive pulmonary disease with acute lower respiratory infection: Secondary | ICD-10-CM | POA: Diagnosis not present

## 2017-07-21 DIAGNOSIS — I252 Old myocardial infarction: Secondary | ICD-10-CM | POA: Diagnosis not present

## 2017-07-21 DIAGNOSIS — J209 Acute bronchitis, unspecified: Secondary | ICD-10-CM | POA: Diagnosis not present

## 2017-07-21 DIAGNOSIS — E119 Type 2 diabetes mellitus without complications: Secondary | ICD-10-CM | POA: Diagnosis not present

## 2017-07-21 DIAGNOSIS — M5412 Radiculopathy, cervical region: Secondary | ICD-10-CM | POA: Diagnosis not present

## 2017-10-17 DIAGNOSIS — E119 Type 2 diabetes mellitus without complications: Secondary | ICD-10-CM | POA: Diagnosis not present

## 2017-10-17 DIAGNOSIS — R1084 Generalized abdominal pain: Secondary | ICD-10-CM | POA: Diagnosis not present

## 2017-10-17 DIAGNOSIS — E860 Dehydration: Secondary | ICD-10-CM | POA: Diagnosis not present

## 2017-10-17 DIAGNOSIS — R112 Nausea with vomiting, unspecified: Secondary | ICD-10-CM | POA: Diagnosis not present

## 2017-10-27 IMAGING — DX DG CHEST 2V
2 series · 2 of 2 positions shown · non-contrast
Comparison: 10/09/2016

CLINICAL DATA: Chest pain, status post coronary bypass grafting

EXAM:
CHEST  2 VIEW

[chest pa]
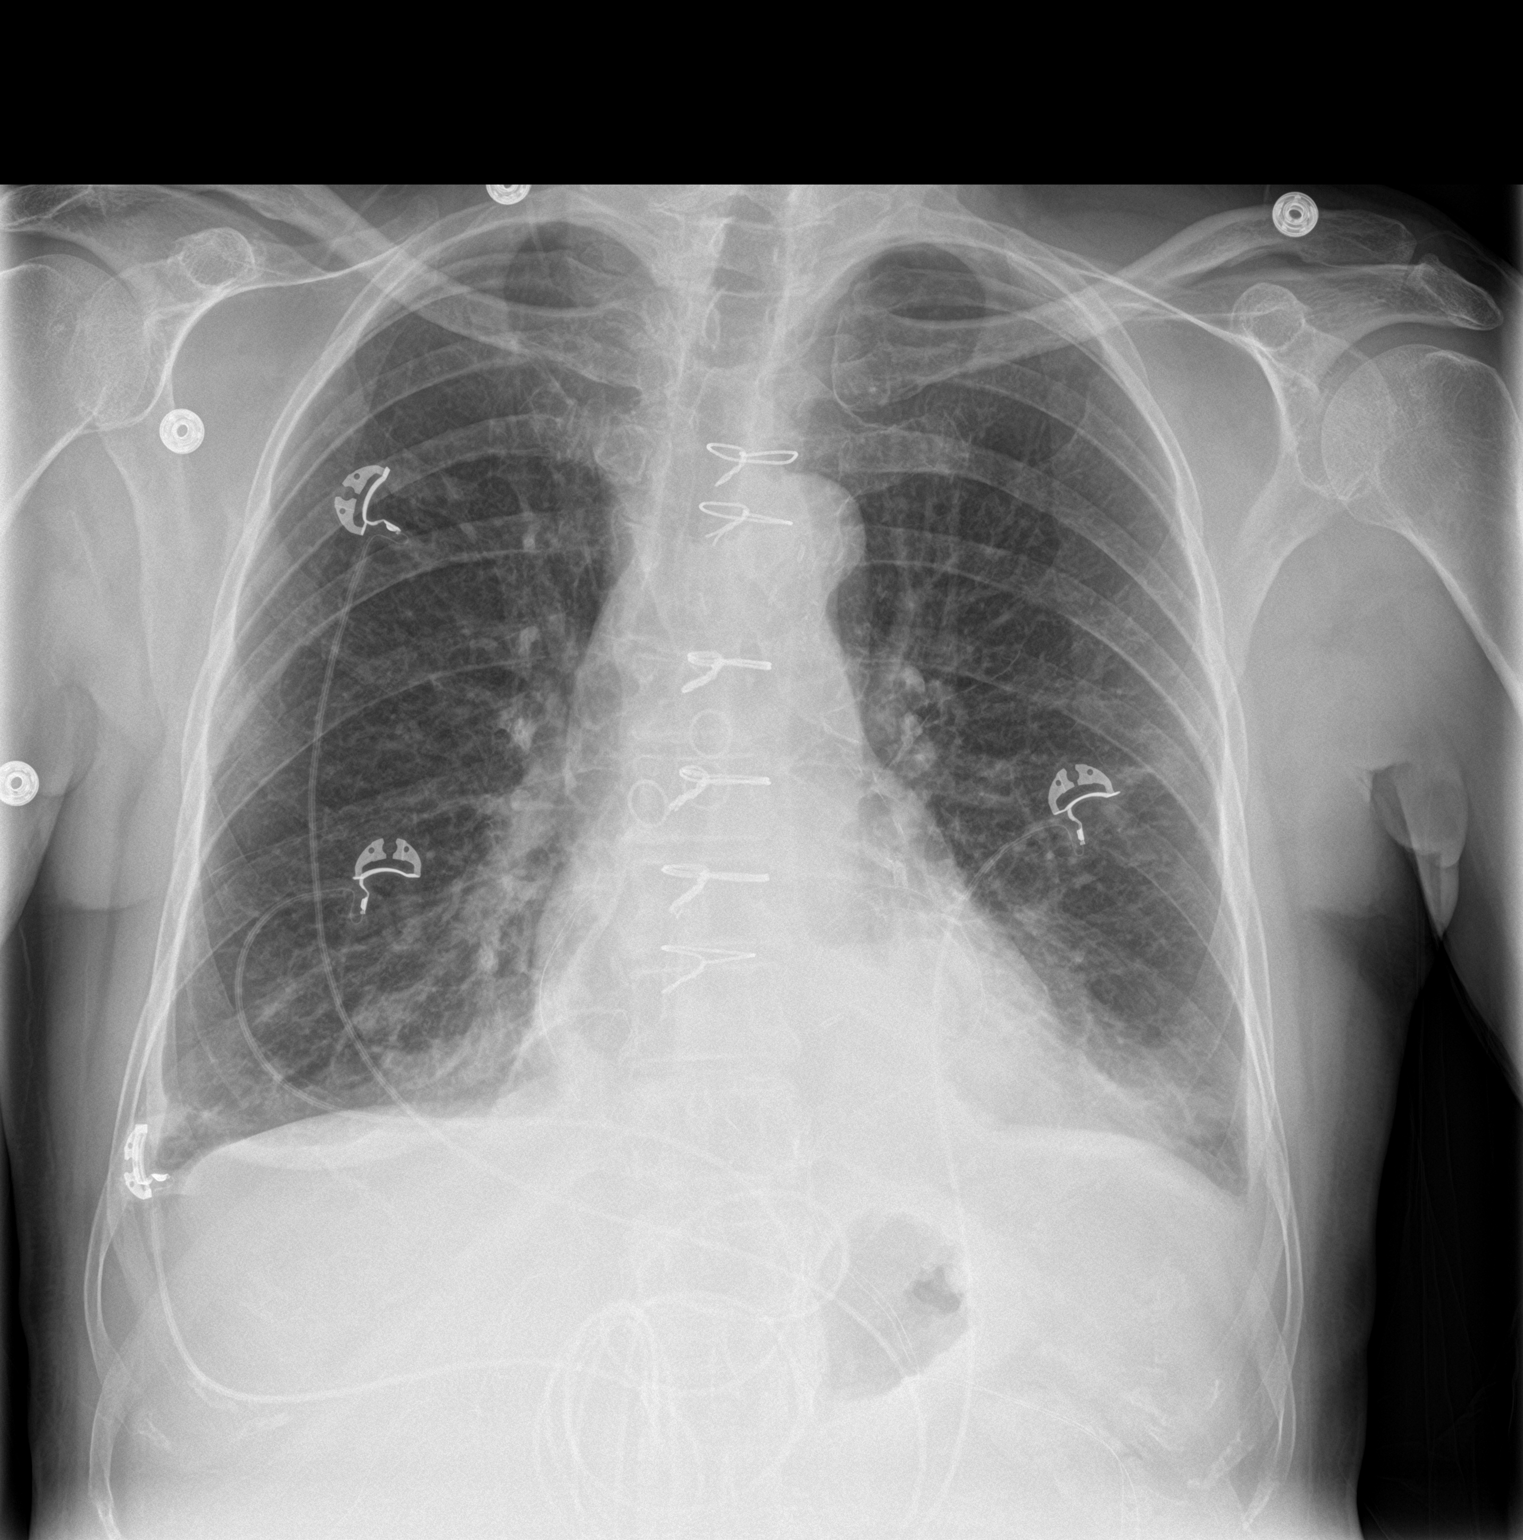

[chest lat]
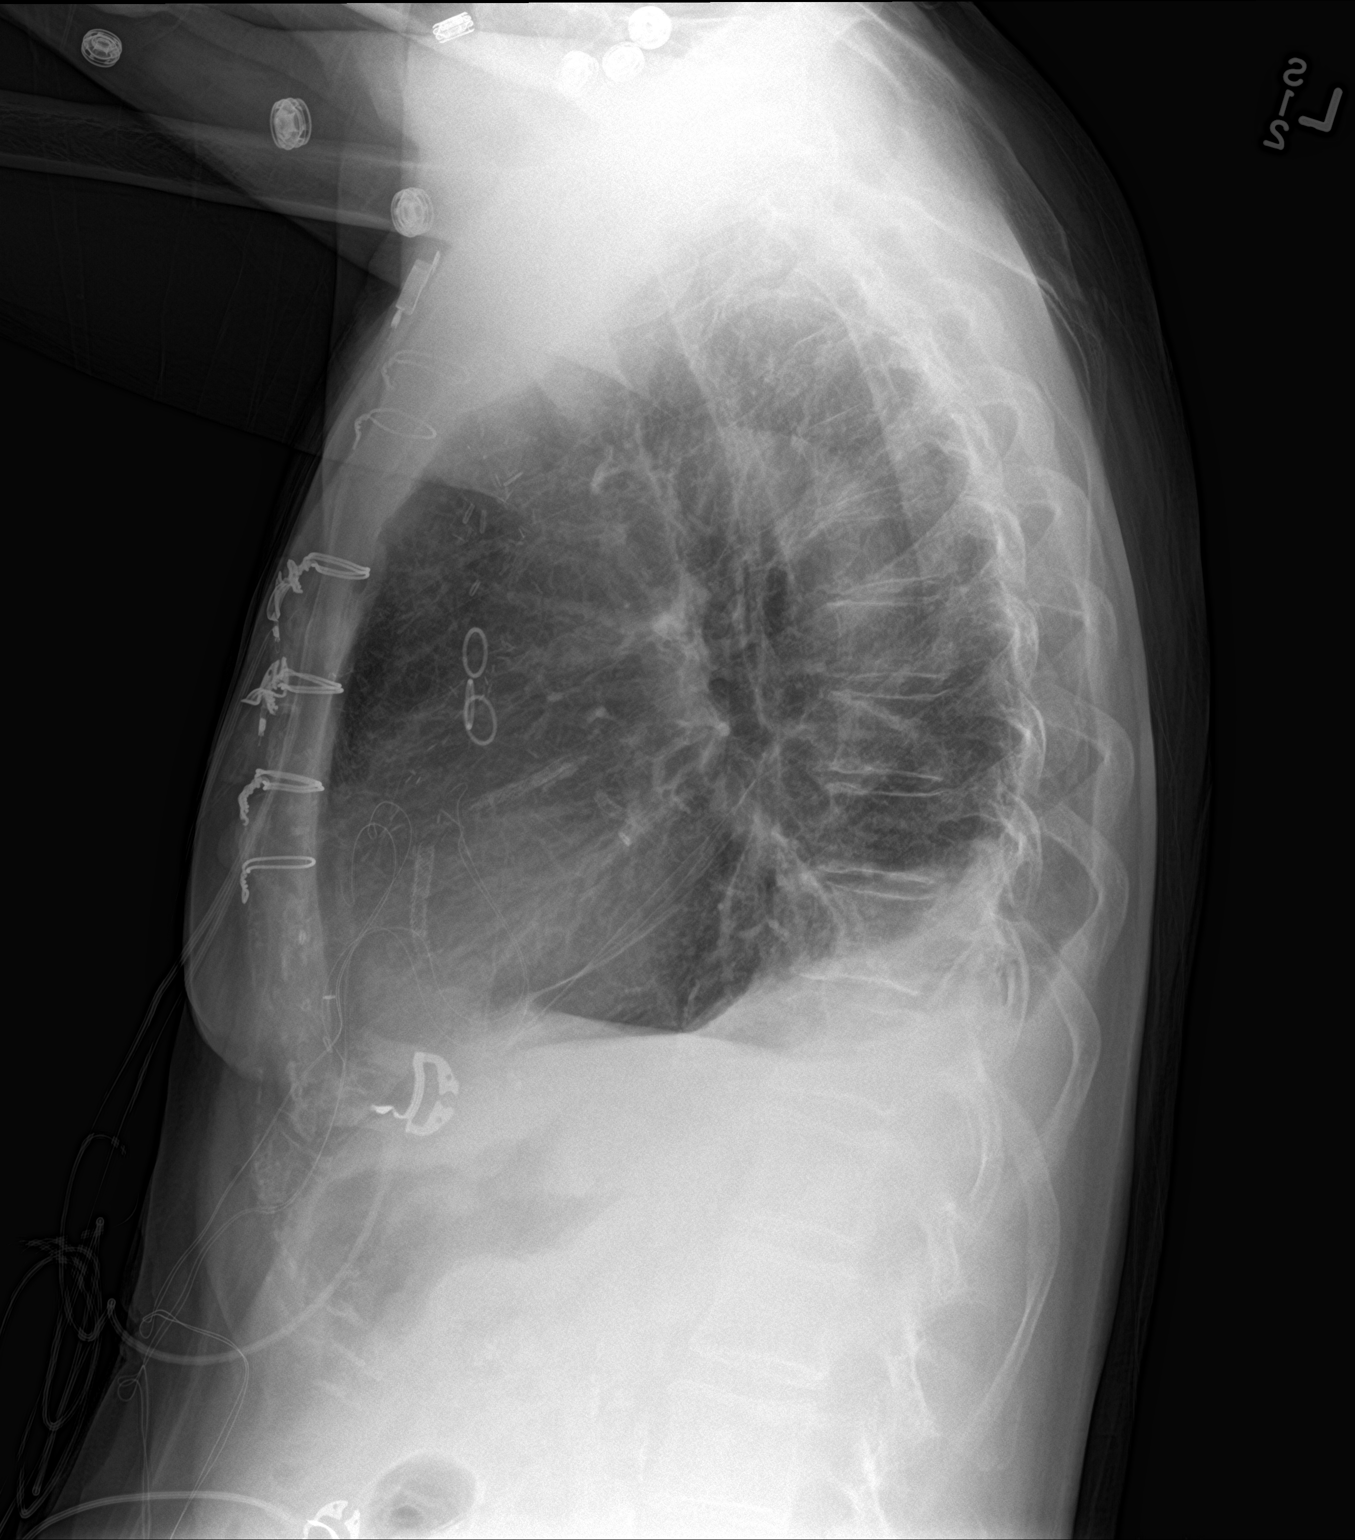

[2 of 2 positions shown; findings below may reference images not displayed]

FINDINGS: Cardiac shadow is stable. Postsurgical changes are again seen.
Continued improved aeration is noted bilaterally although persistent
bilateral lower lobe atelectasis is seen. Small effusions are noted
bilaterally as well. No pneumothorax is seen. No bony abnormality is
noted.
IMPRESSION: Persistent lower lobe changes with small effusions. The overall
appearance has improved from the prior exam however.

## 2017-12-08 IMAGING — CR DG CHEST 2V
2 series · 2 of 2 positions shown · non-contrast
Comparison: 10/20/2016

CLINICAL DATA: Follow-up CABG

EXAM:
CHEST  2 VIEW

[w chest pa]
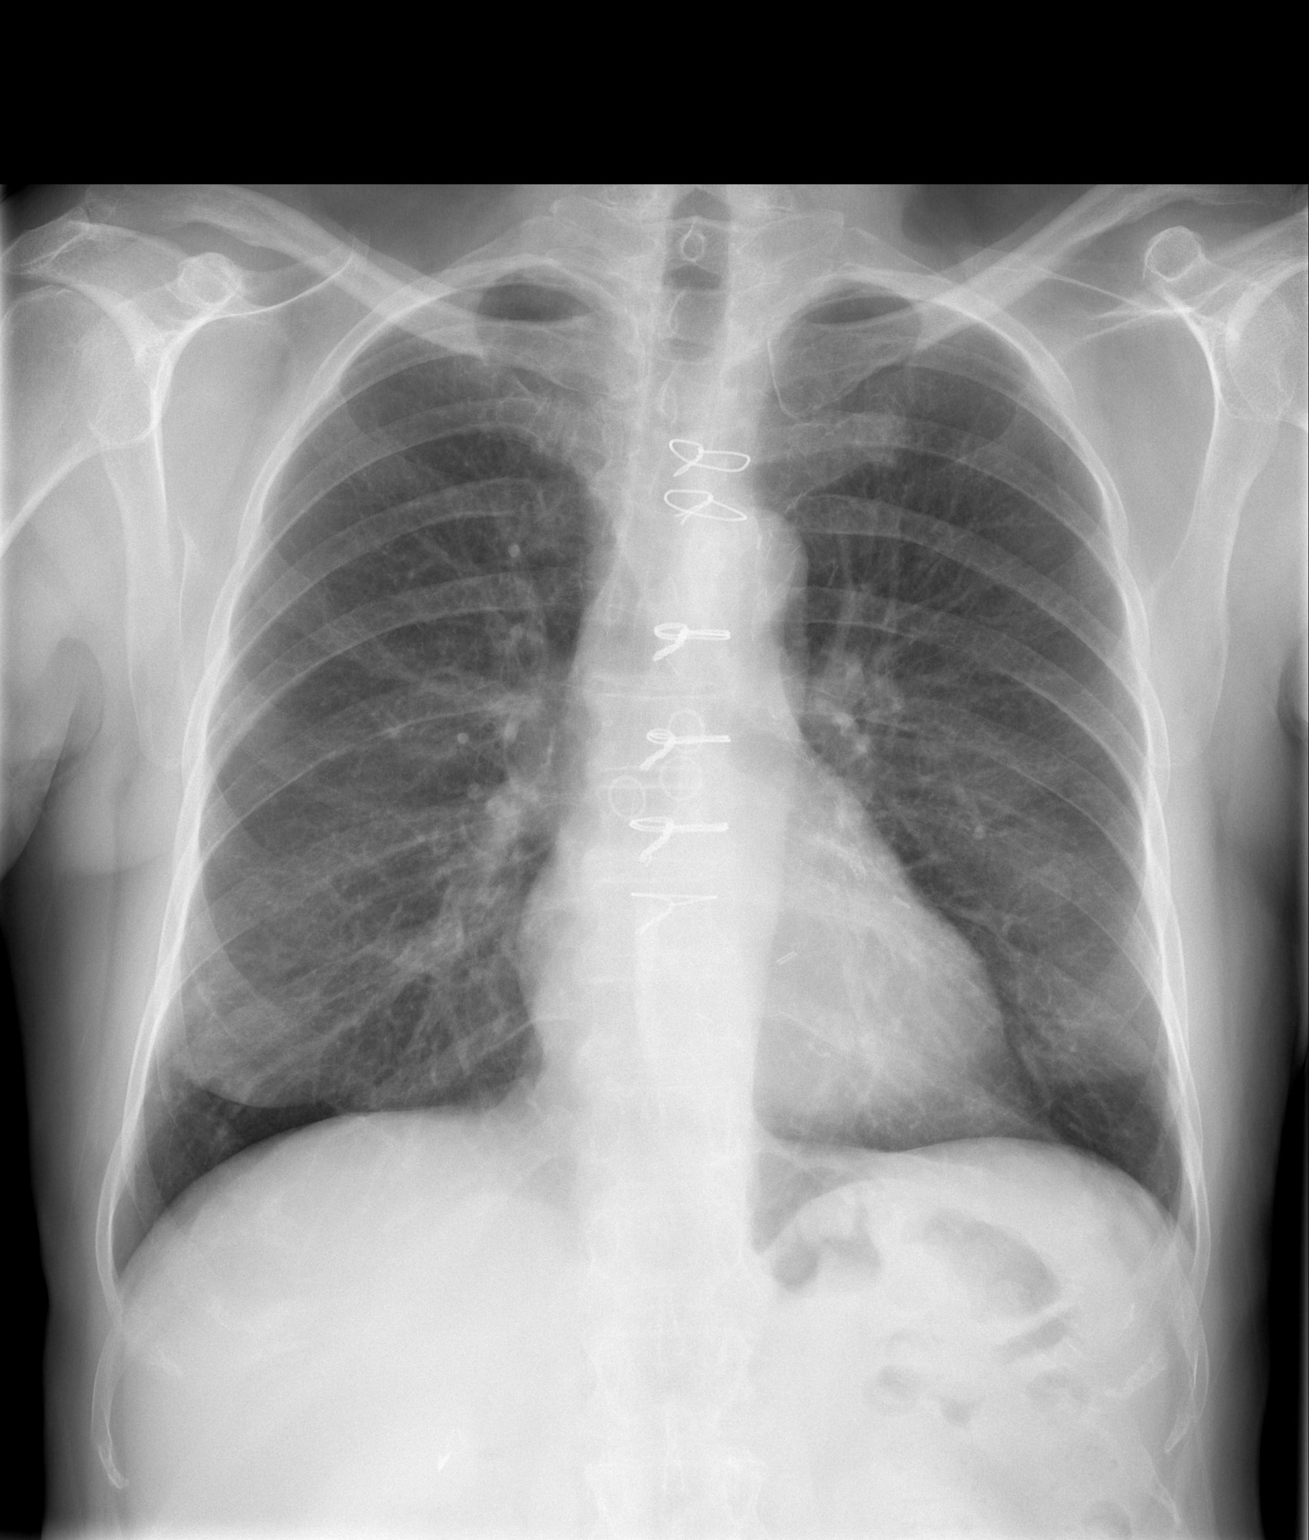

[w chest lat]
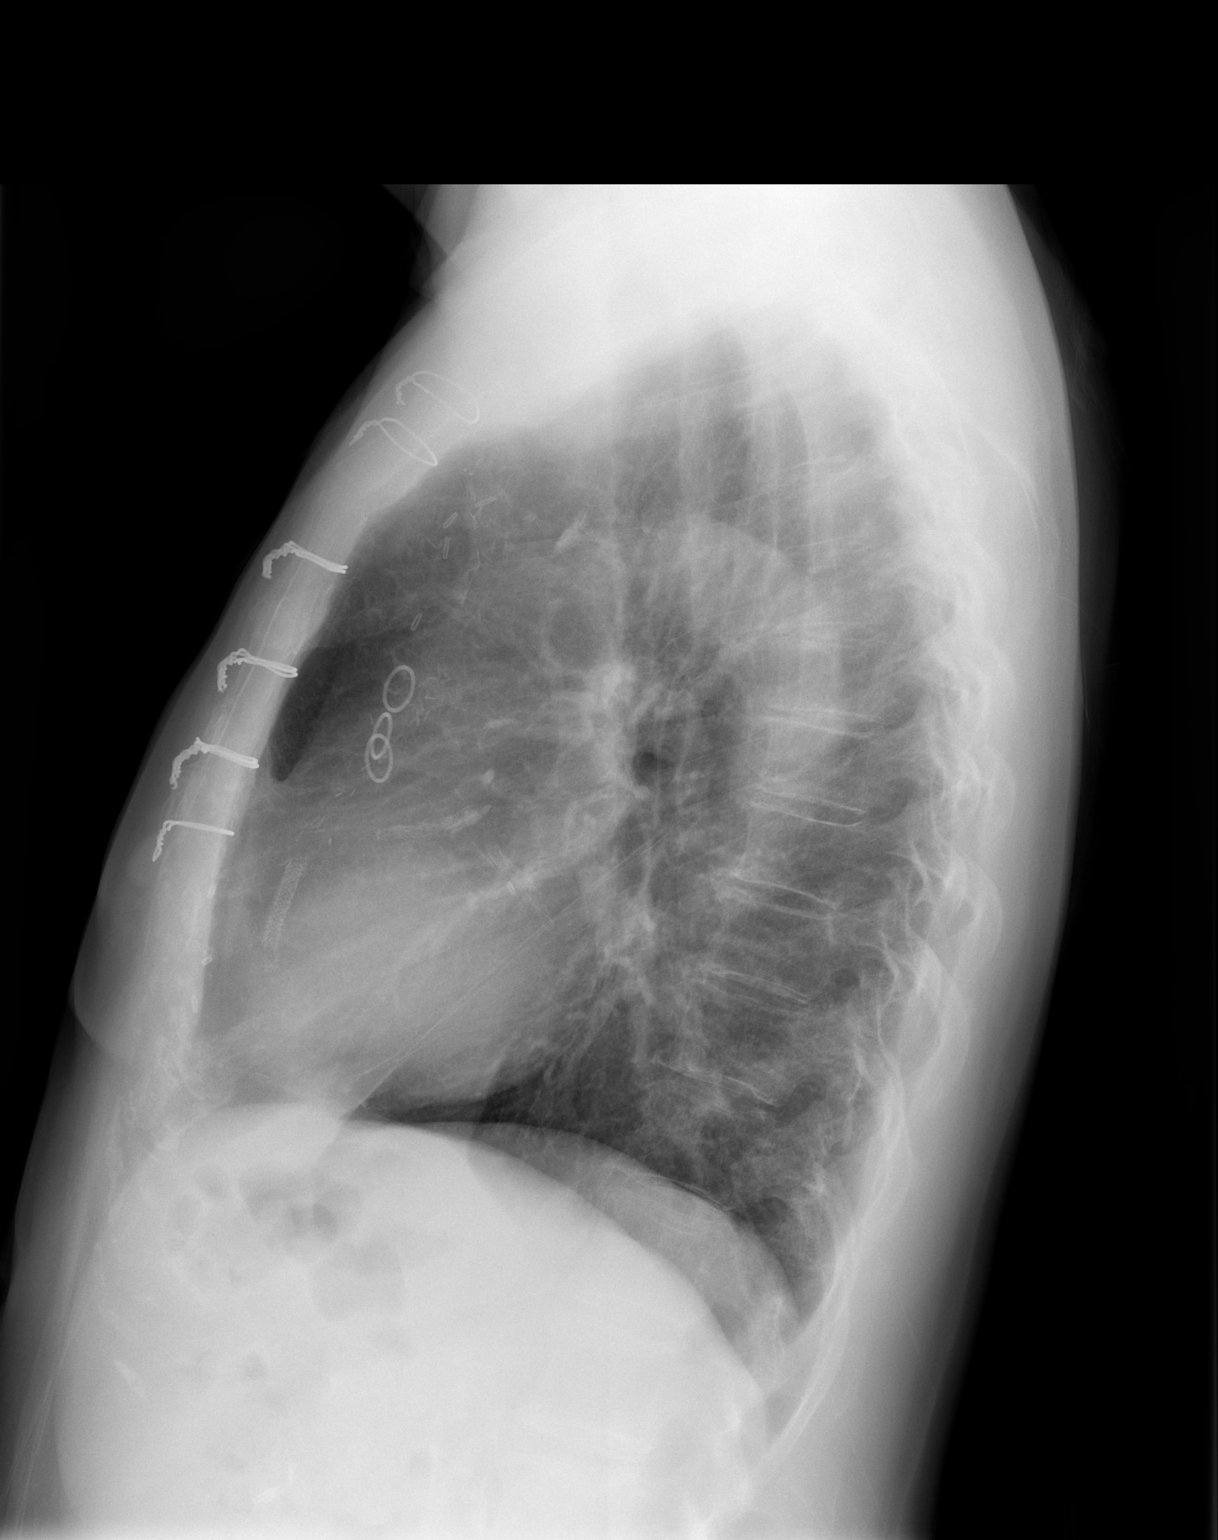

[2 of 2 positions shown; findings below may reference images not displayed]

FINDINGS: Normal heart size and mediastinal contours. Status post CABG and
coronary stenting. There is no edema, consolidation, effusion, or
pneumothorax. Peripheral reticulonodular density at the right base
that is likely post inflammatory/infectious given clustered
appearance and stability since 6222.
IMPRESSION: No evidence of active disease.

## 2017-12-14 ENCOUNTER — Other Ambulatory Visit: Payer: Self-pay | Admitting: Cardiovascular Disease

## 2017-12-14 NOTE — Telephone Encounter (Signed)
pts pharmacy is requesting a refill on atorvastatin and this medication is not on the pt medication list and I do not see where Dr. Burt Knack D/C this med. Does Dr. Burt Knack want the pt to continue taking atorvastatin? Please address.

## 2017-12-24 ENCOUNTER — Telehealth: Payer: Self-pay

## 2017-12-25 NOTE — Telephone Encounter (Signed)
According tomost recent telephone note (06/01/2017) patient D/C Lipitor. Called patient home phone number listed in chart twice and each time it is unable to complete the call because phone number does not work. I couldn't verify with patient if she is currently still taking lipitor.

## 2019-09-17 ENCOUNTER — Encounter: Payer: Self-pay | Admitting: General Practice

## 2020-07-07 DIAGNOSIS — C3430 Malignant neoplasm of lower lobe, unspecified bronchus or lung: Secondary | ICD-10-CM | POA: Insufficient documentation

## 2020-07-07 DIAGNOSIS — C7931 Secondary malignant neoplasm of brain: Secondary | ICD-10-CM | POA: Insufficient documentation

## 2021-02-03 NOTE — Progress Notes (Signed)
[facility]  [address] [place],  [place]  [address] [phone]  Clinic Day:  02/04/2021  Referring physician: Townsend Roger, MD  This document serves as a record of services personally performed by Hosie Poisson, MD. It was created on their behalf by [facility], a trained medical scribe. The creation of this record is based on the scribe's personal observations and the provider's statements to them.  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  Current Treatment:  Supportive care   HISTORY OF PRESENT ILLNESS:  [name] is a 75 y.o. female referred by Dr. 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  She states that her lung cancer first manifested as trouble of the right eye and pain which radiated to the right side of her head. MRI imaging in April 2022 revealed intrinsic hyperintense T1 signal lesions within the right cerebrum demonstrate postcontrast enhancement consistent with hemorrhagic metastases.  One lesion was in the right occipital lobe and one in the posterior right frontal lobe.  She did have evidence of vasogenic edema and was placed on dexamethasone.  She also had evidence of multiple lacunar infarctions of the basal ganglia.  PET in May confirmed a hypermetabolic nodule in the right lower lobe consistent with bronchogenic carcinoma.  No evidence of other metastatic disease was observed.  She received 1 treatment of chemotherapy (with possible cisplatin/Taxol/pembrolizumab), and stereotactic radiation to the two brain lesions.  This was completed in early June.  The patient states that she really struggled with chemotherapy and radiation and had to be hospitalized twice due to her severe weakness.  She declined to pursue further chemotherapy after that experience.  She moved from [place] to [place] in July to live with her daughter.  We only  have limited records from her oncologist in [place].  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  She has many comorbidities including diabetes, coronary artery disease, history of bypass surgery, atrial fibrillation, neuropathy and chronic kidney disease.  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  She is hoping to undergo cataract surgery soon.  She has continued weakness and numbness of the right upper extremity since her stroke in October 2020.  She notes back pain, which starts between her shoulder blades and radiates out.  This can be severe, a 9/10, but she has only been using Tylenol.  I will place her on tramadol 50 mg every 6 hours to use as needed.  She has shortness of breath, worse with exertion, and nonproductive cough.  She uses a walker to ambulate.  She reports fatigue.  She has some memory impairment.  She quit smoking 2 weeks ago.  Blood counts and chemistries are unremarkable except for a hemoglobin of 10.8, and a total protein of 6.2.  Her  appetite is good, and is eating well.  She reports initial weight loss but has started to gain back some weight.  She denies fever, chills or other signs of infection.  She denies nausea, vomiting, bowel issues, or abdominal pain.  She denies sore throat or chest pain.  She is accompanied by her daughter.    REVIEW OF SYSTEMS:  Review of Systems  Constitutional: Negative.  Negative for appetite change, chills,  fatigue, fever and unexpected weight change.  HENT:  Negative.    Eyes:  Positive for eye problems (blind of the right eye).  Respiratory:  Positive for cough and shortness of breath (worse with exertion). Negative for chest tightness, hemoptysis and wheezing.   Cardiovascular: Negative.  Negative for chest pain, leg swelling and palpitations.  Gastrointestinal: Negative.  Negative for  abdominal distention, abdominal pain, blood in stool, constipation, diarrhea, nausea and vomiting.  Endocrine: Negative.   Genitourinary: Negative.  Negative for difficulty urinating, dysuria, frequency and hematuria.   Musculoskeletal:  Positive for back pain (occasionally severe) and gait problem (uses a walker to ambulate). Negative for arthralgias, flank pain and myalgias.  Skin: Negative.   Neurological:  Positive for extremity weakness (of the right upper extremity), gait problem (uses a walker to ambulate) and numbness (of the right upper extremity). Negative for dizziness, headaches, light-headedness, seizures and speech difficulty.  Hematological: Negative.   Psychiatric/Behavioral:  Positive for confusion (some memory impairment). Negative for depression and sleep disturbance. The patient is not nervous/anxious.     VITALS:  Blood pressure (!) 149/63, pulse 70, temperature 98.2 F (36.8 C), temperature source Oral, resp. rate 18, height 5\' 7"  (1.702 m), weight 128 lb 4.8 oz (58.2 kg), SpO2 98 %.  Wt Readings from Last 3 Encounters:  02/04/21 128 lb 4.8 oz (58.2 kg)  05/30/17 141 lb (64 kg)  12/11/16 137 lb (62.1 kg)    Body mass index is 20.09 kg/m.  Performance status (ECOG): 1 - Symptomatic but completely ambulatory  PHYSICAL EXAM:  Physical Exam Constitutional:      General: She is not in acute distress.    Appearance: Normal appearance. She is normal weight.  HENT:     Head: Normocephalic and atraumatic.  Eyes:     General: No scleral icterus.    Extraocular Movements: Extraocular movements intact.     Conjunctiva/sclera: Conjunctivae normal.     Pupils: Pupils are equal, round, and reactive to light.     Comments: She has partial opacification of the right eye  Cardiovascular:     Rate and Rhythm: Normal rate and regular rhythm.     Pulses: Normal pulses.     Heart sounds: Normal heart sounds. No murmur heard.   No friction rub. No gallop.  Pulmonary:      Effort: Pulmonary effort is normal. No respiratory distress.     Breath sounds: Normal breath sounds.  Chest:     Comments: Her anterior chest has a large sternal scar from her coronary artery bypass surgery.   Abdominal:     General: Bowel sounds are normal. There is no distension.     Palpations: Abdomen is soft. There is no hepatomegaly, splenomegaly or mass.     Tenderness: There is no abdominal tenderness.     Comments: She has a right upper quadrant scar from open cholecystectomy.  Musculoskeletal:        General: Normal range of motion.     Cervical back: Normal range of motion and neck supple.     Right lower leg: Edema (trace) present.     Left lower leg: Edema (trace) present.  Lymphadenopathy:     Cervical: No cervical adenopathy.  Skin:    General: Skin is warm and dry.  Neurological:     General: No focal deficit present.     Mental Status: She is alert and oriented to person, place, and time. Mental status is at baseline.     Cranial  Nerves: Cranial nerves are intact.     Sensory: Sensation is intact.     Motor: Weakness (mild, of the right upper extremity) present.     Coordination: Romberg sign negative.  Psychiatric:        Mood and Affect: Mood normal.        Behavior: Behavior normal.        Thought Content: Thought content normal.        Judgment: Judgment normal.    LABS:   CBC Latest Ref Rng & Units 02/04/2021 10/20/2016 10/10/2016  WBC - 6.0 11.8(H) 11.5(H)  Hemoglobin 12.0 - 16.0 10.8(A) 9.9(L) 8.4(L)  Hematocrit 36 - 46 31(A) 31.0(L) 24.5(L)  Platelets 150 - 399 190 375 153   CMP Latest Ref Rng & Units 02/04/2021 10/20/2016 10/10/2016  Glucose 65 - 99 mg/dL - 83 72  BUN 4 - 21 16 12  21(H)  Creatinine 0.5 - 1.1 0.9 1.07(H) 1.05(H)  Sodium 137 - 147 140 138 139  Potassium 3.4 - 5.3 3.5 3.7 4.1  Chloride 99 - 108 108 101 103  CO2 13 - 22 24(A) 28 28  Calcium 8.7 - 10.7 9.1 9.2 8.8(L)  Total Protein 6.0 - 8.3 g/dL - - -  Total Bilirubin 0.3 - 1.2 mg/dL  - - -  Alkaline Phos 25 - 125 47 - -  AST 13 - 35 25 - -  ALT 7 - 35 14 - -     No results found for: CEA1 / No results found for: CEA1  No results found for: TIBC, FERRITIN, IRONPCTSAT   STUDIES:  No results found.   EXAM: 10/19/2020 MRI BRAIN WITH AND WITHOUT CONTRAST:   Stable exam for treatment planning purposes. Intrinsic hyperintense T1 signal lesions within the right cerebrum demonstrate postcontrast enhancement consistent with hemorrhagic metastases. One lesion was in the right occipital lobe and one in the posterior right frontal lobe.    EXAM: 05/12/022 NM PET SKULL BASE TO THIGH  Hypermetabolic nodule in the right lower lobe consistent with bronchogenic carcinoma.  No evidence of metastatic disease.  HISTORY:   Past Medical History:  Diagnosis Date   Arthritis    Asthma    CAD (coronary artery disease) 09/09/2010   S/p multiple PCIs to RCA, LAD, LCx // s/p inf STEMI 4/18 >> POBA to mRCA followed by CABG (L-LAD, S-D1, S-OM, S-PDA) // Intraoperative TEE 4/18: EF 50-55, no RWMA   Diabetes mellitus    Diabetic nephropathy (HCC)    stage II   Diabetic neuropathy (HCC)    GERD (gastroesophageal reflux disease)    History of MI (myocardial infarction) 05/2002   Hyperlipidemia 09/09/2010       Hypertension    Lung cancer ([facility])    with brain metastases   Persistent atrial fibrillation ([facility]) 10/01/2016   Post op AFib after CABG 4/18 // Amiodarone and Coumadin started // Amiodarone stopped 11/2016   Stroke [facility])     Past Surgical History:  Procedure Laterality Date   CHOLECYSTECTOMY     CORONARY ARTERY BYPASS GRAFT N/A 10/06/2016   Procedure: CORONARY ARTERY BYPASS GRAFTING (CABG) x four, using internal mammary, and bilateral saphenous veins harvested endoscopically;  Surgeon: Melrose Nakayama, MD;  Location: [facility];  Service: Open Heart Surgery;  Laterality: N/A;   CORONARY BALLOON ANGIOPLASTY N/A 10/01/2016   Procedure: Coronary Balloon Angioplasty;  Surgeon:  Belva Crome, MD;  Location: [facility];  Service: Cardiovascular;  Laterality: N/A;   LEFT HEART CATH AND  CORONARY ANGIOGRAPHY N/A 10/01/2016   Procedure: Left Heart Cath and Coronary Angiography;  Surgeon: Belva Crome, MD;  Location: [facility];  Service: Cardiovascular;  Laterality: N/A;   TEE WITHOUT CARDIOVERSION N/A 10/06/2016   Procedure: TRANSESOPHAGEAL ECHOCARDIOGRAM (TEE);  Surgeon: Melrose Nakayama, MD;  Location: [facility];  Service: Open Heart Surgery;  Laterality: N/A;    Family History  Problem Relation Age of Onset   Tuberculosis Mother    Cancer Father 62       unknown type   Leukemia Sister    Tuberculosis Sister    Cancer Sister        liver   Cancer Brother 53   Cancer Brother    Heart attack Brother 7   Cancer Brother     Social History:  reports that she quit smoking about 2 weeks ago. Her smoking use included cigarettes. She has a 60.00 pack-year smoking history. She has never used smokeless tobacco. She reports that she does not drink alcohol and does not use drugs.The patient is accompanied by her daughter today.  She is widowed and lives at home with her daughter.  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  Allergies:  Allergies  Allergen Reactions   Ergocalciferol Nausea And Vomiting    Current Medications: Current Outpatient Medications  Medication Sig Dispense Refill   atorvastatin (LIPITOR) 80 MG tablet Take 80 mg by mouth daily.     brimonidine (ALPHAGAN) 0.2 % ophthalmic solution in the morning and at bedtime.     glipiZIDE (GLUCOTROL) 5 MG tablet Take by mouth daily before breakfast.     JANUVIA 100 MG tablet Take 100 mg by mouth daily.     metoprolol tartrate (LOPRESSOR) 50 MG tablet Take 50 mg by mouth as needed.     omeprazole (PRILOSEC) 40 MG capsule Take 40 mg by mouth every morning.     rivaroxaban (XARELTO) 20 MG TABS tablet Take 20 mg by mouth daily with supper.     No current facility-administered  medications for this visit.     ASSESSMENT & PLAN:   Assessment:    Lung cancer stage IV, diagnosed in April 2022.  She received 1 dose of chemotherapy.  She describes three separate medications and so I suspect she may have received combination cisplatin/Taxol/pembrolizumab, and tolerated this very poorly.  She is now agreeable to consider treatment as long as it is a milder regimen.  2.  Brain metastases, April 2022.  This presented with right sided head pain and pain of the right eye.  She has received stereotactic radiation to the two brain lesions.   3.  History of stroke, October 2020, with residual right sided weakness and numbness of the right upper extremity.  4.  Significant family history of malignancy with her father and multiple brothers and sisters with cancer.  We reviewed potentially pursuing hereditary genetic testing to determine if she has any significant mutations, as this could affect her daughter.  We have asked her to get [place] additional family history.  5.  Significant back pain, not well controlled.  I will try her on tramadol 50 mg Q6 prn.  6.  Significant cardiac disease with atrial fibrillation and history of coronary artery bypass.  7.  Anemia, mild, and could likely be from her malignancy and/or chemotherapy.  I will look for any reversible causes such as B12, folate and iron.  Plan: This is a pleasant 75 year old female with  lung cancer with metastases to the brain, who recently moved here from [place].  We will order CT chest and MRI brain to ascertain her new disease baseline.  She did struggle with chemotherapy but I suspect she was on a fairly aggressive combination of chemotherapy and immunotherapy.  She is willing to consider a different treatment as long as it is a milder agent.  Her 1st and only treatment was in June.  We will give her some time to recover from her prior therapies and wait for the results of her imaging before pursing further palliative  treatment.  It is important that I obtain her pathology report and prior scans as well as the records of her chemotherapy.  We will need to see if she has had genomic testing of her tumor such as [facility] as this may assist in choosing future agents for treatment.  If she does not wish to pursue treatment, we will continue to provide supportive care.  In view of her severe back pain, I will try her on tramadol 50 mg Q6 prn.  I reassured her that she could pursue cataract surgery at this time.  We will bring back next month with repeat labs to formulate a plan of care.  She and her daughter understand and agree with this plan of care.  I have answered their questions and they know to call with any concerns.   Thank you for the opportunity to participate in the care of your patients   I provided 60 minutes of face-to-face time during this this encounter and > 50% was spent counseling as documented under my assessment and plan.    Derwood Kaplan, MD [facility] AT [facility] [address] [place] [address] Dept: [phone] Dept Fax: [phone]   I, Rita Ohara, am acting as scribe for Derwood Kaplan, MD  I have reviewed this report as typed by the medical scribe, and it is complete and accurate.  Hermina Barters

## 2021-02-04 ENCOUNTER — Encounter: Payer: Self-pay | Admitting: Oncology

## 2021-02-04 ENCOUNTER — Other Ambulatory Visit: Payer: Self-pay

## 2021-02-04 ENCOUNTER — Inpatient Hospital Stay (INDEPENDENT_AMBULATORY_CARE_PROVIDER_SITE_OTHER): Payer: Medicare Other | Admitting: Oncology

## 2021-02-04 ENCOUNTER — Other Ambulatory Visit: Payer: Self-pay | Admitting: Oncology

## 2021-02-04 ENCOUNTER — Other Ambulatory Visit: Payer: Self-pay | Admitting: Hematology and Oncology

## 2021-02-04 ENCOUNTER — Inpatient Hospital Stay: Payer: Medicare Other | Attending: Oncology

## 2021-02-04 VITALS — BP 149/63 | HR 70 | Temp 98.2°F | Resp 18 | Ht 67.0 in | Wt 128.3 lb

## 2021-02-04 DIAGNOSIS — C3431 Malignant neoplasm of lower lobe, right bronchus or lung: Secondary | ICD-10-CM

## 2021-02-04 DIAGNOSIS — I252 Old myocardial infarction: Secondary | ICD-10-CM | POA: Insufficient documentation

## 2021-02-04 DIAGNOSIS — C3490 Malignant neoplasm of unspecified part of unspecified bronchus or lung: Secondary | ICD-10-CM

## 2021-02-04 DIAGNOSIS — E785 Hyperlipidemia, unspecified: Secondary | ICD-10-CM | POA: Diagnosis not present

## 2021-02-04 DIAGNOSIS — Z7901 Long term (current) use of anticoagulants: Secondary | ICD-10-CM | POA: Insufficient documentation

## 2021-02-04 DIAGNOSIS — D649 Anemia, unspecified: Secondary | ICD-10-CM

## 2021-02-04 DIAGNOSIS — C7931 Secondary malignant neoplasm of brain: Secondary | ICD-10-CM

## 2021-02-04 DIAGNOSIS — Z87891 Personal history of nicotine dependence: Secondary | ICD-10-CM | POA: Insufficient documentation

## 2021-02-04 DIAGNOSIS — Z79899 Other long term (current) drug therapy: Secondary | ICD-10-CM | POA: Insufficient documentation

## 2021-02-04 DIAGNOSIS — Z7984 Long term (current) use of oral hypoglycemic drugs: Secondary | ICD-10-CM | POA: Insufficient documentation

## 2021-02-04 DIAGNOSIS — E119 Type 2 diabetes mellitus without complications: Secondary | ICD-10-CM | POA: Insufficient documentation

## 2021-02-04 DIAGNOSIS — D539 Nutritional anemia, unspecified: Secondary | ICD-10-CM

## 2021-02-04 DIAGNOSIS — Z8673 Personal history of transient ischemic attack (TIA), and cerebral infarction without residual deficits: Secondary | ICD-10-CM | POA: Diagnosis not present

## 2021-02-04 DIAGNOSIS — I4891 Unspecified atrial fibrillation: Secondary | ICD-10-CM | POA: Insufficient documentation

## 2021-02-04 DIAGNOSIS — Z808 Family history of malignant neoplasm of other organs or systems: Secondary | ICD-10-CM

## 2021-02-04 LAB — HEPATIC FUNCTION PANEL
ALT: 14 (ref 7–35)
AST: 25 (ref 13–35)
Alkaline Phosphatase: 47 (ref 25–125)
Bilirubin, Total: 0.4

## 2021-02-04 LAB — BASIC METABOLIC PANEL
BUN: 16 (ref 4–21)
CO2: 24 — AB (ref 13–22)
Chloride: 108 (ref 99–108)
Creatinine: 0.9 (ref 0.5–1.1)
Glucose: 125
Potassium: 3.5 (ref 3.4–5.3)
Sodium: 140 (ref 137–147)

## 2021-02-04 LAB — COMPREHENSIVE METABOLIC PANEL
Albumin: 3.7 (ref 3.5–5.0)
Calcium: 9.1 (ref 8.7–10.7)

## 2021-02-04 LAB — CBC AND DIFFERENTIAL
HCT: 31 — AB (ref 36–46)
Hemoglobin: 10.8 — AB (ref 12.0–16.0)
Neutrophils Absolute: 2.76
Platelets: 190 (ref 150–399)
WBC: 6

## 2021-02-04 LAB — CBC: RBC: 3.27 — AB (ref 3.87–5.11)

## 2021-02-04 MED ORDER — TRAMADOL HCL 50 MG PO TABS: 50.0000 mg | ORAL_TABLET | Freq: Four times a day (QID) | ORAL | 0 refills | Status: DC | PRN

## 2021-02-06 LAB — CEA: CEA: 1.2 ng/mL (ref 0.0–4.7)

## 2021-02-09 ENCOUNTER — Other Ambulatory Visit: Payer: Self-pay | Admitting: Oncology

## 2021-02-09 DIAGNOSIS — C3431 Malignant neoplasm of lower lobe, right bronchus or lung: Secondary | ICD-10-CM

## 2021-02-10 ENCOUNTER — Other Ambulatory Visit: Payer: Self-pay

## 2021-02-10 DIAGNOSIS — D539 Nutritional anemia, unspecified: Secondary | ICD-10-CM

## 2021-02-10 DIAGNOSIS — C3431 Malignant neoplasm of lower lobe, right bronchus or lung: Secondary | ICD-10-CM | POA: Diagnosis not present

## 2021-02-10 LAB — IRON AND TIBC
Iron: 61 ug/dL (ref 28–170)
Saturation Ratios: 17 % (ref 10.4–31.8)
TIBC: 364 ug/dL (ref 250–450)
UIBC: 303 ug/dL

## 2021-02-10 LAB — FOLATE: Folate: 14.2 ng/mL (ref >5.9)

## 2021-02-10 LAB — FERRITIN: Ferritin: 22 ng/mL (ref 11–307)

## 2021-02-10 LAB — VITAMIN B12: Vitamin B-12: 349 pg/mL (ref 180–914)

## 2021-02-14 ENCOUNTER — Telehealth: Payer: Self-pay

## 2021-02-14 NOTE — Telephone Encounter (Signed)
-----   Message from Derwood Kaplan, MD sent at 02/13/2021  8:13 PM EDT ----- Regarding: call pt Tell her vitamin levels are normal

## 2021-02-14 NOTE — Telephone Encounter (Signed)
Phone disconnected or no longer in service

## 2021-03-04 NOTE — Progress Notes (Signed)
[facility]  [address] [place],  [place]  [address] [phone]  Clinic Day:  03/11/2021  Referring physician: Townsend Roger, MD  This document serves as a record of services personally performed by Hosie Poisson, MD. It was created on their behalf by [facility], a trained medical scribe. The creation of this record is based on the scribe's personal observations and the provider's statements to them.  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  Current Treatment:  Supportive care   HISTORY OF PRESENT ILLNESS:  [name] is a 75 y.o. female referred by Dr. 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  She states that her lung cancer first manifested as trouble of the right eye and pain which radiated to the right side of her head. MRI imaging in April 2022 revealed intrinsic hyperintense T1 signal lesions within the right cerebrum demonstrate postcontrast enhancement consistent with hemorrhagic metastases.  One lesion was in the right occipital lobe and one in the posterior right frontal lobe.  She did have evidence of vasogenic edema and was placed on dexamethasone.  She also had evidence of multiple lacunar infarctions of the basal ganglia.  PET in May confirmed a hypermetabolic nodule in the right lower lobe consistent with bronchogenic carcinoma.  No evidence of other metastatic disease was observed.  She received 1 treatment of chemotherapy (with possible cisplatin/Taxol/pembrolizumab), and stereotactic radiation to the two brain lesions.  This was completed in early June.  The patient states that she really struggled with chemotherapy and radiation and had to be hospitalized twice due to her severe weakness.  She declined to pursue further chemotherapy after that experience.  She moved from [place] to [place] in July to live with her daughter.  We only  have limited records from her oncologist in [place].  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  She has many comorbidities including diabetes, coronary artery disease, history of bypass surgery, atrial fibrillation, neuropathy and chronic kidney disease. 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  She is hoping to undergo cataract surgery soon.  She has continued weakness and numbness of the right upper extremity since her stroke in October 2020.  She was found to be anemic with a hemoglobin of 10.8 at the last check.  INTERVAL HISTORY:  [name] is here for routine follow up and to review recent imaging results. CT chest from September 14th revealed a spiculated right lower lobe pulmonary nodule measuring 2.0 x 1.9 cm, initially 1.8 cm, which likely represents the site of known bronchogenic neoplasm. Also with findings suspicious for right infrahilar nodal involvement despite small size. Smaller nodular foci in the right lower lobe suspicious for multifocal involvement, of uncertain significance in the absence of priors. Small nodules also in the contralateral lung near the apex are indeterminate. There is a 13 mm left adrenal nodule which is well-circumscribed and within 1-2 mm of its size in 2018 not present in 2011. Findings more likely reflect adrenal adenoma. She continues to report fatigue and weight loss. Her back pain is well controlled on tramadol 50 mg and she does not even require this daily. She report insomnia and so I will try her on Ativan 0.5 mg at bedtime. Blood counts and chemistries are unremarkable.  Her  appetite is good, but she has lost 5 pounds since her last visit.  She denies fever, chills or other signs of infection.  She denies nausea, vomiting, bowel issues, or abdominal pain.  She denies sore throat, cough, dyspnea, or chest pain.  REVIEW OF SYSTEMS:  Review of Systems   Constitutional:  Positive for fatigue and unexpected weight change (weight loss). Negative for appetite change, chills and fever.  HENT:  Negative.    Eyes: Negative.   Respiratory: Negative.  Negative for chest tightness, cough, hemoptysis, shortness of breath and wheezing.   Cardiovascular: Negative.  Negative for chest pain, leg swelling and palpitations.  Gastrointestinal: Negative.  Negative for abdominal distention, abdominal pain, blood in stool, constipation, diarrhea, nausea and vomiting.  Endocrine: Negative.   Genitourinary: Negative.  Negative for difficulty urinating, dysuria, frequency and hematuria.   Musculoskeletal: Negative.  Negative for arthralgias, back pain, flank pain, gait problem and myalgias.  Skin: Negative.   Neurological: Negative.  Negative for dizziness, extremity weakness, gait problem, headaches, light-headedness, numbness, seizures and speech difficulty.  Hematological: Negative.   Psychiatric/Behavioral:  Positive for sleep disturbance (insomnia). Negative for depression. The patient is not nervous/anxious.     VITALS:  Blood pressure 132/63, pulse (!) 55, temperature 98.3 F (36.8 C), temperature source Oral, resp. rate 18, height 5\' 7"  (1.702 m), weight 123 lb 3.2 oz (55.9 kg), SpO2 98 %.  Wt Readings from Last 3 Encounters:  03/11/21 123 lb 3.2 oz (55.9 kg)  02/04/21 128 lb 4.8 oz (58.2 kg)  05/30/17 141 lb (64 kg)    Body mass index is 19.3 kg/m.  Performance status (ECOG): 1 - Symptomatic but completely ambulatory  PHYSICAL EXAM:  Physical Exam Constitutional:      General: She is not in acute distress.    Appearance: Normal appearance. She is normal weight.  HENT:     Head: Normocephalic and atraumatic.  Eyes:     General: No scleral icterus.    Extraocular Movements: Extraocular movements intact.     Conjunctiva/sclera: Conjunctivae normal.     Pupils: Pupils are equal, round, and reactive to light.  Cardiovascular:     Rate and  Rhythm: Normal rate and regular rhythm.     Pulses: Normal pulses.     Heart sounds: Normal heart sounds. No murmur heard.   No friction rub. No gallop.  Pulmonary:     Effort: Pulmonary effort is normal. No respiratory distress.     Breath sounds: Normal breath sounds.  Abdominal:     General: Bowel sounds are normal. There is no distension.     Palpations: Abdomen is soft. There is no hepatomegaly, splenomegaly or mass.     Tenderness: There is no abdominal tenderness.  Musculoskeletal:        General: Normal range of motion.     Cervical back: Normal range of motion and neck supple.     Right lower leg: No edema.     Left lower leg: No edema.  Lymphadenopathy:     Cervical: No cervical adenopathy.  Skin:    General: Skin is warm and dry.  Neurological:     General: No focal deficit present.     Mental Status: She is alert and oriented to person, place, and time. Mental status is at baseline.  Psychiatric:        Mood and Affect: Mood normal.        Behavior: Behavior normal.        Thought  Content: Thought content normal.        Judgment: Judgment normal.    LABS:   CBC Latest Ref Rng & Units 03/09/2021 02/04/2021 10/20/2016  WBC - 4.7 6.0 11.8(H)  Hemoglobin 12.0 - 16.0 12.6 10.8(A) 9.9(L)  Hematocrit 36 - 46 38 31(A) 31.0(L)  Platelets 150 - 399 190 190 375   CMP Latest Ref Rng & Units 03/09/2021 02/04/2021 10/20/2016  Glucose 65 - 99 mg/dL - - 83  BUN 4 - 21 16 16 12   Creatinine 0.5 - 1.1 0.8 0.9 1.07(H)  Sodium 137 - 147 138 140 138  Potassium 3.4 - 5.3 3.6 3.5 3.7  Chloride 99 - 108 102 108 101  CO2 13 - 22 25(A) 24(A) 28  Calcium 8.7 - 10.7 9.6 9.1 9.2  Total Protein 6.0 - 8.3 g/dL - - -  Total Bilirubin 0.3 - 1.2 mg/dL - - -  Alkaline Phos 25 - 125 63 47 -  AST 13 - 35 28 25 -  ALT 7 - 35 15 14 -     Lab Results  Component Value Date   CEA1 1.2 02/04/2021   /  CEA  Date Value Ref Range Status  02/04/2021 1.2 0.0 - 4.7 ng/mL Final    Comment:     (NOTE)                             Nonsmokers          <3.9                             Smokers             <5.6 [employer] Electrochemiluminescence Immunoassay (ECLIA) Values obtained with different assay methods or kits cannot be used interchangeably.  Results cannot be interpreted as absolute evidence of the presence or absence of malignant disease. Performed At: [facility] [place], [place] [id number] Rush Farmer MD DQ:[id number]     Lab Results  Component Value Date   TIBC 364 02/10/2021   FERRITIN 22 02/10/2021   IRONPCTSAT 17 02/10/2021     STUDIES:  No results found.   EXAM: 03/09/2021 CT CHEST WITH CONTRAST  TECHNIQUE: Multidetector CT imaging of the chest was performed during intravenous contrast administration.  CONTRAST:  60 cc Isovue 370  COMPARISON:  No recent comparison imaging of the chest aside from chest x-rays from 2019.  FINDINGS: Cardiovascular: Calcified atheromatous plaque of the thoracic aorta, noncalcified plaque as well. Signs of CABG and coronary artery disease of native coronary vasculature. Normal heart size without substantial pericardial effusion. Normal caliber of the central pulmonary vasculature.  Mediastinum/Nodes: Esophagus grossly unremarkable. No thoracic inlet lymphadenopathy. No axillary lymphadenopathy. No mediastinal adenopathy. Mild fullness of LEFT hilar lymph nodes (image 60/2) not definitely seen on remote imaging from 2011 measuring 7 mm. RIGHT infrahilar lymph node on image 57/2 measuring 8-9 mm.  Lungs/Pleura: Signs of pulmonary emphysema. Spiculated RIGHT lower lobe pulmonary nodule measuring 2.0 x 1.9 cm likely represents the site of known bronchogenic neoplasm. Smaller nodular foci in the RIGHT lower lobe suspicious for multifocal involvement, of uncertain significance in the absence of priors.  (Image 79/4) 5 mm RIGHT lower lobe pulmonary nodule.  (Image 77/4) 3 mm RIGHT  lower lobe pulmonary nodule.  (Image 72/4) a 10 x 6 mm RIGHT lower lobe irregular pulmonary nodule with adjacent small nodules and  scattered smaller nodules throughout the superior segment of the RIGHT lower lobe. No effusion. No consolidation. Scattered nodules also in the LEFT chest (image 35/4) 3 mm LEFT upper lobe pulmonary nodule. Other smaller nodules towards the LEFT lung apex at the periphery of the LEFT lung.  Upper Abdomen: Imaged portions the liver are unremarkable. Post cholecystectomy without gross biliary duct distension. 13 mm LEFT adrenal nodule is well-circumscribed and within 1-2 mm of its size in 2018 not present in 2011. The RIGHT adrenal gland is normal. Cyst arises from the upper pole of the LEFT kidney. No acute gastrointestinal findings in the upper abdomen.  Musculoskeletal: No acute bone finding or destructive bone process. Changes of median sternotomy.  IMPRESSION: Spiculated RIGHT lower lobe pulmonary nodule measuring 2.0 x 1.9 cm likely represents the site of known bronchogenic neoplasm. Also with findings suspicious for RIGHT infrahilar nodal involvement despite small size.  Smaller nodular foci in the RIGHT lower lobe suspicious for multifocal involvement, of uncertain significance in the absence of priors. Small nodules also in the contralateral lung near the apex are indeterminate.  13 mm LEFT adrenal nodule is well-circumscribed and within 1-2 mm of its size in 2018 not present in 2011. Findings more likely reflect adrenal adenoma. This is however new based on comparison with 2011. Consider adrenal protocol CT for confirmation patient with history of lung cancer.  Signs of CABG and coronary artery disease of native coronary vasculature.  Aortic Atherosclerosis (ICD10-I70.0) and Emphysema (ICD10-J43.9).  HISTORY:   Allergies:  Allergies  Allergen Reactions   Ergocalciferol Nausea And Vomiting    Current Medications: Current Outpatient  Medications  Medication Sig Dispense Refill   atorvastatin (LIPITOR) 80 MG tablet Take 80 mg by mouth daily.     brimonidine (ALPHAGAN) 0.2 % ophthalmic solution in the morning and at bedtime.     brimonidine-timolol (COMBIGAN) 0.2-0.5 % ophthalmic solution Place 1 drop into the right eye 2 (two) times daily.     dexamethasone (DECADRON) 4 MG tablet Take 4 mg by mouth daily as needed.     glipiZIDE (GLUCOTROL) 5 MG tablet Take by mouth daily before breakfast.     HUMULIN R 100 UNIT/ML injection      JANUVIA 100 MG tablet Take 100 mg by mouth daily.     LORazepam (ATIVAN) 0.5 MG tablet Take 1 tablet (0.5 mg total) by mouth at bedtime. 30 tablet 0   losartan (COZAAR) 25 MG tablet Take 25 mg by mouth daily.     metoprolol tartrate (LOPRESSOR) 50 MG tablet Take 50 mg by mouth as needed.     mupirocin cream (BACTROBAN) 2 % Apply topically.     ofloxacin (OCUFLOX) 0.3 % ophthalmic solution Place into the left eye.     omeprazole (PRILOSEC) 40 MG capsule Take 40 mg by mouth every morning.     prednisoLONE acetate (PRED FORTE) 1 % ophthalmic suspension Place into the left eye.     rivaroxaban (XARELTO) 20 MG TABS tablet Take 20 mg by mouth daily with supper.     traMADol (ULTRAM) 50 MG tablet Take 1 tablet (50 mg total) by mouth every 6 (six) hours as needed. 30 tablet 0   No current facility-administered medications for this visit.     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  She received 1 dose of chemotherapy in June.  She describes three separate medications and so I suspect she may  have received combination cisplatin/Taxol/pembrolizumab, and tolerated this very poorly.  She is now agreeable to consider treatment as long as it is a milder regimen.  2.  Brain metastases, April 2022.  This presented with right sided head pain and pain of the right eye.  She has received stereotactic radiation to the two brain lesions.  She is scheduled for MRI brain on September 26th.  3.  History of stroke, October 2020, with residual right sided weakness and numbness of the right upper extremity.  4.  Significant family history of malignancy with her father and multiple brothers and sisters with cancer.  We reviewed potentially pursuing hereditary genetic testing to determine if she has any significant mutations, as this could affect her daughter.  We have asked her to get [place] additional family history.  5.  Significant back pain, better controlled on tramadol 50 mg Q6 prn. She does not require this daily. I will refill this today.  6.  Significant cardiac disease with atrial fibrillation and history of coronary artery bypass.  7.  Anemia, mild, and could likely be from her malignancy and/or chemotherapy.  Her vitamin levels were normal.  8. Insomnia. I will try her on Ativan 0.5 mg at bedtime.  Plan: Recent CT chest revealed a spiculated right lower lobe pulmonary nodule measuring 2.0 x 1.9 cm which likely represents the site of known bronchogenic neoplasm. Also with findings suspicious for right infrahilar nodal involvement despite small size. Smaller nodular foci in the right lower lobe suspicious for multifocal involvement, of uncertain significance. Small nodules also in the contralateral lung near the apex are indeterminate. Thankfully there is no evidence of metastatic disease to the liver. She is scheduled for MRI brain on September 26th. 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 We will plan to order genomic testing through [facility] or [facility] to assess for targeted therapy. She did struggle with chemotherapy but she was on a fairly aggressive combination of chemotherapy and immunotherapy.  She is willing to consider a  different treatment as long as it is a milder agent.  I would recommend immunotherapy. She would need to undergo port placement prior to initiating. We will bring back in 2 weeks to formulate a plan of care.  I will refill tramadol 50 mg Q6H prn. She and her daughter understand and agree with this plan of care.  I have answered their questions and they know to call with any concerns.    I provided 20 minutes of face-to-face time during this this encounter and > 50% was spent counseling as documented under my assessment and plan.    Derwood Kaplan, MD [facility] AT [facility] [address] [place] [address] Dept: [phone] Dept Fax: [phone]   I, Rita Ohara, am acting as scribe for Derwood Kaplan, MD  I have reviewed this report as typed by the medical scribe, and it is complete and accurate.  Hermina Barters

## 2021-03-09 LAB — HEPATIC FUNCTION PANEL
ALT: 15 (ref 7–35)
AST: 28 (ref 13–35)
Alkaline Phosphatase: 63 (ref 25–125)
Bilirubin, Total: 0.3

## 2021-03-09 LAB — BASIC METABOLIC PANEL
BUN: 16 (ref 4–21)
CO2: 25 — AB (ref 13–22)
Chloride: 102 (ref 99–108)
Creatinine: 0.8 (ref 0.5–1.1)
Glucose: 114
Potassium: 3.6 (ref 3.4–5.3)
Sodium: 138 (ref 137–147)

## 2021-03-09 LAB — CBC AND DIFFERENTIAL
HCT: 38 (ref 36–46)
Hemoglobin: 12.6 (ref 12.0–16.0)
Neutrophils Absolute: 2.35
Platelets: 190 (ref 150–399)
WBC: 4.7

## 2021-03-09 LAB — COMPREHENSIVE METABOLIC PANEL
Albumin: 4.2 (ref 3.5–5.0)
Calcium: 9.6 (ref 8.7–10.7)

## 2021-03-09 LAB — CBC: RBC: 4.1 (ref 3.87–5.11)

## 2021-03-11 ENCOUNTER — Inpatient Hospital Stay: Payer: Medicare Other | Attending: Oncology | Admitting: Oncology

## 2021-03-11 ENCOUNTER — Other Ambulatory Visit: Payer: Self-pay | Admitting: Hematology and Oncology

## 2021-03-11 ENCOUNTER — Encounter: Payer: Self-pay | Admitting: Oncology

## 2021-03-11 ENCOUNTER — Telehealth: Payer: Self-pay | Admitting: Oncology

## 2021-03-11 ENCOUNTER — Other Ambulatory Visit: Payer: Medicare Other

## 2021-03-11 VITALS — BP 132/63 | HR 55 | Temp 98.3°F | Resp 18 | Ht 67.0 in | Wt 123.2 lb

## 2021-03-11 DIAGNOSIS — C3431 Malignant neoplasm of lower lobe, right bronchus or lung: Secondary | ICD-10-CM

## 2021-03-11 DIAGNOSIS — C7931 Secondary malignant neoplasm of brain: Secondary | ICD-10-CM

## 2021-03-11 MED ORDER — TRAMADOL HCL 50 MG PO TABS: 50.0000 mg | ORAL_TABLET | Freq: Four times a day (QID) | ORAL | 0 refills | Status: DC | PRN

## 2021-03-11 MED ORDER — LORAZEPAM 0.5 MG PO TABS: 0.5000 mg | ORAL_TABLET | Freq: Every day | ORAL | 0 refills | Status: DC

## 2021-03-11 NOTE — Telephone Encounter (Signed)
Per 9/16 LOS, patient scheduled for 9/30 Follow Up Appt.  Gave patient Appt Calendar

## 2021-03-18 NOTE — Progress Notes (Addendum)
This is a Telehealth visit and the patient and her daughter [name] have consented to this consult by phone.  This lasted from 2:40 pm to 3 pm (20 minutes). Daughter has requested we call on her mobile [phone] or home [phone]  LOCATION:    Patient: Home Provider: [facility]  [address] [place],  [place]  [address] [phone]  Clinic Day:  03/25/2021  Referring physician: Townsend Roger, MD  This document serves as a record of services personally performed by Hosie Poisson, MD. It was created on their behalf by [facility], a trained medical scribe. The creation of this record is based on the scribe's personal observations and the provider's statements to them.  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  Current Treatment:  Immunotherapy   HISTORY OF PRESENT ILLNESS:  [name] is a 75 y.o. female referred by Dr. 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  She states that her lung cancer first manifested as trouble of the right eye and pain which radiated to the right side of her head. MRI imaging in April 2022 revealed intrinsic hyperintense T1 signal lesions within the right cerebrum demonstrate postcontrast enhancement consistent with hemorrhagic metastases.  One lesion was in the right occipital lobe and one in the posterior right frontal lobe.  She did have evidence of vasogenic edema and was placed on dexamethasone.  She also had evidence of multiple lacunar infarctions of the basal ganglia.  PET in May confirmed a hypermetabolic nodule in the right lower lobe consistent with bronchogenic carcinoma.  No evidence of other metastatic disease was observed.  She received 1 treatment of chemotherapy (with possible cisplatin/Taxol/pembrolizumab), and stereotactic radiation to the two brain lesions.  This  was completed in early June.  The patient states that she really struggled with chemotherapy and radiation and had to be hospitalized twice due to her severe weakness.  She declined to pursue further chemotherapy after that experience.  She moved from [place] to [place] in July to live with her daughter.  We only have limited records from her oncologist in [place]. Review of her pathology revealed poorly differentiated carcinoma consistent with lung primary and favoring squamous cell histology. The results of her Guardant testing were tracked down which were negative for any targetable mutations.   CT chest from September 14th 2022 revealed a spiculated right lower lobe pulmonary nodule measuring 2.0 x 1.9 cm, initially 1.8 cm, which likely represents the site of known bronchogenic neoplasm. Also with findings suspicious for right infrahilar nodal involvement despite small size. Smaller nodular foci in the right lower lobe suspicious for multifocal involvement, of uncertain significance in the absence of priors. Small nodules also in the contralateral lung near the apex are indeterminate. There is a 13 mm left adrenal nodule which is well-circumscribed and within 1-2 mm of its size in 2018 not present in 2011. Findings more likely reflect adrenal adenoma.  She was found to be anemic with a hemoglobin of 10.8 at the last check. She was complaining of insomnia when I saw her in mid September and I added Ativan 0.5 mg to use at bedtime. She does have a history of prior stroke in 2020 with some residual weakness of the right side.  INTERVAL HISTORY:  Payge was present for Telehealth visit along with  her daughter, [name]. I reviewed the results of her current imaging as well as the information obtained from her [place] records.  CT chest from September 14th revealed the spiculated right lower lobe pulmonary nodule measuring 2.0 x 1.9 cm which likely represents the site of known bronchogenic neoplasm. Also  with findings suspicious for right infrahilar nodal involvement despite small size. Smaller nodular foci in the right lower lobe suspicious for multifocal involvement, of uncertain significance in the absence of priors. Small nodules also in the contralateral lung near the apex are indeterminate. 13 mm left adrenal nodule is well-circumscribed and within 1-2 mm of its size in 2018, not present in 2011. These findings more likely reflect adrenal adenoma. MRI brain from September 26th revealed four metastatic deposits in the brain, 7 mm or less. Several show mild associated hemorrhage and mild edema. No mass-effect or midline shift. These results were reviewed with the patient. The patient says her appetite is good, but she does have occasional dysphagia to solid foods. Her main complaint is severe fatigue. Her daughter describes an episode of expressive aphagia and asks whether that could be a recurrent stroke. I do think that is the case, as her MRI does show some chronic lacunar infarctions of the left side. She is already on anticoagulation with Xarelto. She still is having problems with insomnia and the Ativan 0.5 mg only helps her to sleep about 2 hours. I instructed her that she could increase this to 2 at bedtime. She denies fever, chills or other signs of infection.  She denies nausea, vomiting, bowel issues, or abdominal pain.  She denies sore throat, cough, dyspnea, or chest pain.  REVIEW OF SYSTEMS:  Review of Systems  Constitutional:  Positive for fatigue. Negative for appetite change, chills, fever and unexpected weight change.  HENT:   Positive for trouble swallowing (with solid foods).   Eyes: Negative.   Respiratory: Negative.  Negative for chest tightness, cough, hemoptysis, shortness of breath and wheezing.   Cardiovascular: Negative.  Negative for chest pain, leg swelling and palpitations.  Gastrointestinal: Negative.  Negative for abdominal distention, abdominal pain, blood in stool,  constipation, diarrhea, nausea and vomiting.  Endocrine: Negative.   Genitourinary: Negative.  Negative for difficulty urinating, dysuria, frequency and hematuria.   Musculoskeletal: Negative.  Negative for arthralgias, back pain, flank pain, gait problem and myalgias.  Skin: Negative.   Neurological: Negative.  Negative for dizziness, extremity weakness, gait problem, headaches, light-headedness, numbness, seizures and speech difficulty.  Hematological: Negative.   Psychiatric/Behavioral:  Positive for sleep disturbance (insomnia). Negative for depression. The patient is not nervous/anxious.     VITALS:  There were no vitals taken for this visit.  Wt Readings from Last 3 Encounters:  03/11/21 123 lb 3.2 oz (55.9 kg)  02/04/21 128 lb 4.8 oz (58.2 kg)  05/30/17 141 lb (64 kg)    There is no height or weight on file to calculate BMI.  Performance status (ECOG): 1 - Symptomatic but completely ambulatory  PHYSICAL EXAM:  Physical Exam was not conducted as this visit was through Telehealth  LABS:   CBC Latest Ref Rng & Units 03/09/2021 02/04/2021 10/20/2016  WBC - 4.7 6.0 11.8(H)  Hemoglobin 12.0 - 16.0 12.6 10.8(A) 9.9(L)  Hematocrit 36 - 46 38 31(A) 31.0(L)  Platelets 150 - 399 190 190 375   CMP Latest Ref Rng & Units 03/09/2021 02/04/2021 10/20/2016  Glucose 65 - 99 mg/dL - - 83  BUN 4 - 21 16 16 12   Creatinine  0.5 - 1.1 0.8 0.9 1.07(H)  Sodium 137 - 147 138 140 138  Potassium 3.4 - 5.3 3.6 3.5 3.7  Chloride 99 - 108 102 108 101  CO2 13 - 22 25(A) 24(A) 28  Calcium 8.7 - 10.7 9.6 9.1 9.2  Total Protein 6.0 - 8.3 g/dL - - -  Total Bilirubin 0.3 - 1.2 mg/dL - - -  Alkaline Phos 25 - 125 63 47 -  AST 13 - 35 28 25 -  ALT 7 - 35 15 14 -     Lab Results  Component Value Date   CEA1 1.2 02/04/2021   /  CEA  Date Value Ref Range Status  02/04/2021 1.2 0.0 - 4.7 ng/mL Final    Comment:    (NOTE)                             Nonsmokers          <3.9                              Smokers             <5.6 [employer] Electrochemiluminescence Immunoassay (ECLIA) Values obtained with different assay methods or kits cannot be used interchangeably.  Results cannot be interpreted as absolute evidence of the presence or absence of malignant disease. Performed At: [facility] [place], [place] [id number] Rush Farmer MD DU:[id number]     Lab Results  Component Value Date   TIBC 364 02/10/2021   FERRITIN 22 02/10/2021   IRONPCTSAT 17 02/10/2021     STUDIES:  No results found.   EXAM: 03/09/2021 CT CHEST WITH CONTRAST  TECHNIQUE: Multidetector CT imaging of the chest was performed during intravenous contrast administration.  CONTRAST: 60 cc Isovue 370  COMPARISON: No recent comparison imaging of the chest aside from chest x-rays from 2019.  FINDINGS: Cardiovascular: Calcified atheromatous plaque of the thoracic aorta, noncalcified plaque as well. Signs of CABG and coronary artery disease of native coronary vasculature. Normal heart size without substantial pericardial effusion. Normal caliber of the central pulmonary vasculature. Mediastinum/Nodes: Esophagus grossly unremarkable. No thoracic inlet lymphadenopathy. No axillary lymphadenopathy. No mediastinal adenopathy. Mild fullness of LEFT hilar lymph nodes (image 60/2) not definitely seen on remote imaging from 2011 measuring 7 mm. RIGHT infrahilar lymph node on image 57/2 measuring 8-9 mm. Lungs/Pleura: Signs of pulmonary emphysema. Spiculated RIGHT lower lobe pulmonary nodule measuring 2.0 x 1.9 cm likely represents the site of known bronchogenic neoplasm. Smaller nodular foci in the RIGHT lower lobe suspicious for multifocal involvement, of uncertain significance in the absence of priors. (Image 79/4) 5 mm RIGHT lower lobe pulmonary nodule. (Image 77/4) 3 mm RIGHT lower lobe pulmonary nodule. (Image 72/4) a 10 x 6 mm RIGHT lower lobe irregular pulmonary  nodule with adjacent small nodules and scattered smaller nodules throughout the superior segment of the RIGHT lower lobe. No effusion. No consolidation. Scattered nodules also in the LEFT chest (image 35/4) 3 mm LEFT upper lobe pulmonary nodule. Other smaller nodules towards the LEFT lung apex at the periphery of the LEFT lung. Upper Abdomen: Imaged portions the liver are unremarkable. Post cholecystectomy without gross biliary duct distension. 13 mm LEFT adrenal nodule is well-circumscribed and within 1-2 mm of its size in 2018 not present in 2011. The RIGHT adrenal gland is normal. Cyst arises from the upper pole of the  LEFT kidney. No acute gastrointestinal findings in the upper abdomen. Musculoskeletal: No acute bone finding or destructive bone process. Changes of median sternotomy.  IMPRESSION: Spiculated RIGHT lower lobe pulmonary nodule measuring 2.0 x 1.9 cm likely represents the site of known bronchogenic neoplasm. Also with findings suspicious for RIGHT infrahilar nodal involvement despite small size. Smaller nodular foci in the RIGHT lower lobe suspicious for multifocal involvement, of uncertain significance in the absence of priors. Small nodules also in the contralateral lung near the apex are indeterminate. 13 mm LEFT adrenal nodule is well-circumscribed and within 1-2 mm of its size in 2018 not present in 2011. Findings more likely reflect adrenal adenoma. This is however new based on comparison with 2011. Consider adrenal protocol CT for confirmation patient with history of lung cancer. Signs of CABG and coronary artery disease of native coronary vasculature. Aortic Atherosclerosis (ICD10-I70.0) and Emphysema (ICD10-J43.9).  EXAM: 03/21/2021 MRI HEAD WITHOUT AND WITH CONTRAST  TECHNIQUE: Multiplanar, multiecho pulse sequences of the brain and surrounding structures were obtained without and with intravenous contrast. CONTRAST: 5 mL Gadovist IV  COMPARISON: CT  head 04/12/2005  FINDINGS: Brain: 7 mm hemorrhagic lesion with enhancement in the right posterior frontal lobe with mild edema. Axial image 154. 4 mm ring-enhancing lesion left frontal convexity axial image 190. Slight hemorrhage. 6 mm ring-enhancing lesion right occipital lobe with mild edema. Axial image 101 3 mm slightly hemorrhagic lesion left frontal operculum axial image 122 Generalized atrophy. Moderate white matter changes consistent with chronic microvascular ischemia. Chronic lacunar infarcts in the left basal ganglia. Negative for acute infarct. Vascular: Normal arterial flow voids Skull and upper cervical spine: No worrisome skeletal lesion identified. Sinuses/Orbits: Negative Other: None  IMPRESSION: Four metastatic deposits in the brain. Several show mild associated hemorrhage. Mild edema. No mass-effect or midline shift Atrophy and moderate chronic microvascular ischemia. No acute infarct.  HISTORY:   Allergies:  Allergies  Allergen Reactions   Ergocalciferol Nausea And Vomiting    Current Medications: Current Outpatient Medications  Medication Sig Dispense Refill   atorvastatin (LIPITOR) 80 MG tablet Take 80 mg by mouth daily.     brimonidine (ALPHAGAN) 0.2 % ophthalmic solution in the morning and at bedtime.     brimonidine-timolol (COMBIGAN) 0.2-0.5 % ophthalmic solution Place 1 drop into the right eye 2 (two) times daily.     dexamethasone (DECADRON) 4 MG tablet Take 4 mg by mouth daily as needed.     glipiZIDE (GLUCOTROL XL) 5 MG 24 hr tablet Take 5 mg by mouth daily.     HUMULIN R 100 UNIT/ML injection      JANUVIA 100 MG tablet Take 100 mg by mouth daily.     LORazepam (ATIVAN) 0.5 MG tablet Take 1 tablet (0.5 mg total) by mouth at bedtime. 30 tablet 0   losartan (COZAAR) 25 MG tablet Take 25 mg by mouth daily.     metoprolol tartrate (LOPRESSOR) 50 MG tablet Take 50 mg by mouth as needed.     mupirocin cream (BACTROBAN) 2 % Apply topically.      ofloxacin (OCUFLOX) 0.3 % ophthalmic solution Place into the left eye.     omeprazole (PRILOSEC) 40 MG capsule Take 40 mg by mouth every morning.     prednisoLONE acetate (PRED FORTE) 1 % ophthalmic suspension Place into the left eye.     rivaroxaban (XARELTO) 20 MG TABS tablet Take 20 mg by mouth daily with supper.     traMADol (ULTRAM) 50 MG tablet Take 1 tablet (50  mg total) by mouth every 6 (six) hours as needed. 30 tablet 0   No current facility-administered medications for this visit.     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  She received 1 dose of chemotherapy in June, with combination cisplatin/Taxol/pembrolizumab, and tolerated this very poorly and ended up in the hospital twice.  She is now agreeable to consider treatment as long as it is a milder regimen. I therefore recommend immunotherapy with pembrolizumab every 3 weeks. I have explained this is palliative. We will plan to repeat scans of lung and brain every 3 months.  2.  Brain metastases, April 2022.  This presented with right sided head pain and pain of the right eye.  She has received   stereotactic radiation to the two brain lesions. MRI brain from September 26th revealed four metastatic deposits in the brain. Several show mild associated hemorrhage. Mild edema. No mass-effect or midline shift.  3.  History of stroke, October 2020, with residual right sided weakness and numbness of the right upper extremity.  4.  Significant family history of malignancy with her father and multiple brothers and sisters with cancer.  We reviewed potentially pursuing hereditary genetic testing to determine if she has any significant mutations, as this could affect her daughter.  We have asked her to get [place] additional family history.  5.  Significant back pain, better controlled on tramadol 50 mg Q6 prn. She does not require this  daily.  6.  Significant cardiac disease with atrial fibrillation and history of coronary artery bypass. She is on anticoagulation.  7.  Anemia, mild, and could likely be from her malignancy and/or chemotherapy.  Her vitamin levels were normal.  8. Insomnia despite Ativan 0.5 mg at bedtime. I advised that she increase this to 1 mg at bedtime.  Plan: I reviewed the CT and MRI brain imaging results with the patient and her daughter. We tracked down the results of her Guardant testing which was negative for any targetable mutations. Therefore, I recommend treatment with immunotherapy, pembrolizumab IV every 3 weeks. The schedule and potential toxicities were reviewed, and we will schedule her for an education session with Melissa. She may need to undergo port placement prior to initiating, or we can start out using her peripheral veins and place a port if she tolerates treatment well. We will aim to start therapy on October 10th, and follow up 10 days later to see how she tolerated this. She and her daughter understand and agree with this plan of care.  I have answered their questions and they know to call with any concerns.    I provided 20 minutes of face-to-face time during this this encounter and > 50% was spent counseling as documented under my assessment and plan.    Derwood Kaplan, MD [facility] AT [facility] [address] [place] [address] Dept: [phone] Dept Fax: [phone]   I, Rita Ohara, am acting as scribe for Derwood Kaplan, MD  I have reviewed this report as typed by the medical scribe, and it is complete and accurate.  Hermina Barters

## 2021-03-25 ENCOUNTER — Encounter: Payer: Self-pay | Admitting: Oncology

## 2021-03-25 ENCOUNTER — Other Ambulatory Visit: Payer: Medicare Other

## 2021-03-25 ENCOUNTER — Inpatient Hospital Stay (INDEPENDENT_AMBULATORY_CARE_PROVIDER_SITE_OTHER): Payer: Medicare Other | Admitting: Oncology

## 2021-03-25 ENCOUNTER — Inpatient Hospital Stay: Payer: Medicare Other

## 2021-03-25 DIAGNOSIS — C7931 Secondary malignant neoplasm of brain: Secondary | ICD-10-CM

## 2021-03-25 DIAGNOSIS — C3431 Malignant neoplasm of lower lobe, right bronchus or lung: Secondary | ICD-10-CM

## 2021-03-31 ENCOUNTER — Other Ambulatory Visit: Payer: Self-pay | Admitting: Oncology

## 2021-03-31 NOTE — Progress Notes (Signed)
START OFF PATHWAY REGIMEN - Non-Small Cell Lung   [id number]:Pembrolizumab 200 mg IV D1 q21 Days:   A cycle is every 21 days:     Pembrolizumab   **Always confirm dose/schedule in your pharmacy ordering system**  **Administration Notes: She only received 1 cycle of Cisplatin/Taxol/Keytruda & didn't tolerate  Patient Characteristics: Stage IV Metastatic, Squamous, Molecular Analysis Completed, Alteration Present and Targeted Therapy Exhausted or EGFR Exon 20 Insertion or KRAS G12C or HER2 Present, and No Prior Chemo/Immunotherapy or No Alteration Present, PS = 2, Second Line -  Chemotherapy/Immunotherapy, Prior PD-1/PD-L1 Inhibitor and Immunotherapy Candidate Therapeutic Status: Stage IV Metastatic Histology: Squamous Cell Molecular Analysis Results: No Alteration Present ECOG Performance Status: 2 Chemotherapy/Immunotherapy Line of Therapy: Second Line Chemotherapy/Immunotherapy Immunotherapy Candidate Status: Candidate for Immunotherapy Prior Immunotherapy Status: Prior PD-1/PD-L1 Inhibitor + Platinum-Based Chemotherapy Intent of Therapy: Non-Curative / Palliative Intent, Discussed with Patient

## 2021-04-01 ENCOUNTER — Telehealth: Payer: Self-pay | Admitting: Hematology and Oncology

## 2021-04-01 ENCOUNTER — Other Ambulatory Visit: Payer: Self-pay | Admitting: Hematology and Oncology

## 2021-04-01 ENCOUNTER — Inpatient Hospital Stay: Payer: Medicare Other

## 2021-04-01 ENCOUNTER — Inpatient Hospital Stay: Payer: Medicare Other | Attending: Oncology | Admitting: Hematology and Oncology

## 2021-04-01 ENCOUNTER — Encounter: Payer: Self-pay | Admitting: Hematology and Oncology

## 2021-04-01 VITALS — BP 122/73 | HR 73 | Temp 98.4°F | Resp 18 | Ht 67.0 in | Wt 122.7 lb

## 2021-04-01 DIAGNOSIS — C7931 Secondary malignant neoplasm of brain: Secondary | ICD-10-CM | POA: Insufficient documentation

## 2021-04-01 DIAGNOSIS — Z79899 Other long term (current) drug therapy: Secondary | ICD-10-CM | POA: Insufficient documentation

## 2021-04-01 DIAGNOSIS — Z5112 Encounter for antineoplastic immunotherapy: Secondary | ICD-10-CM | POA: Insufficient documentation

## 2021-04-01 DIAGNOSIS — C3431 Malignant neoplasm of lower lobe, right bronchus or lung: Secondary | ICD-10-CM | POA: Insufficient documentation

## 2021-04-01 LAB — BASIC METABOLIC PANEL
BUN: 16 (ref 4–21)
CO2: 23 — AB (ref 13–22)
Chloride: 102 (ref 99–108)
Creatinine: 1 (ref 0.5–1.1)
Glucose: 118
Potassium: 4.2 (ref 3.4–5.3)
Sodium: 140 (ref 137–147)

## 2021-04-01 LAB — HEPATIC FUNCTION PANEL
ALT: 14 (ref 7–35)
AST: 18 (ref 13–35)
Alkaline Phosphatase: 76 (ref 25–125)
Bilirubin, Total: 0.4

## 2021-04-01 LAB — COMPREHENSIVE METABOLIC PANEL
Albumin: 4.5 (ref 3.5–5.0)
Calcium: 9.9 (ref 8.7–10.7)

## 2021-04-01 LAB — CBC: RBC: 4.6 (ref 3.87–5.11)

## 2021-04-01 MED ORDER — PROCHLORPERAZINE MALEATE 10 MG PO TABS: 10.0000 mg | ORAL_TABLET | Freq: Four times a day (QID) | ORAL | 3 refills | Status: AC | PRN

## 2021-04-01 MED ORDER — ONDANSETRON HCL 4 MG PO TABS: 4.0000 mg | ORAL_TABLET | ORAL | 3 refills | Status: AC | PRN

## 2021-04-01 MED ORDER — TRAMADOL HCL 50 MG PO TABS: 50.0000 mg | ORAL_TABLET | Freq: Four times a day (QID) | ORAL | 0 refills | Status: DC | PRN

## 2021-04-01 MED ORDER — ALPRAZOLAM 0.5 MG PO TABS: 0.5000 mg | ORAL_TABLET | Freq: Three times a day (TID) | ORAL | 0 refills | Status: DC | PRN

## 2021-04-01 NOTE — Progress Notes (Signed)
[place]  Telephone:([phone] [phone]  Patient Care Team: Nona Dell, Corene Cornea, MD as PCP - General (Internal Medicine) Sharmon Revere as Physician Assistant (Cardiology)   Name of the patient: [name]  [id number]  04-04-1946   Date of visit: 04/01/21  Diagnosis- Lung Cancer  Chief complaint/Reason for visit- Initial Meeting for [facility], preparing for starting chemotherapy    Heme/Onc history:  Oncology History  Lung cancer, lower lobe ([place])  07/07/2020 Initial Diagnosis   Lung cancer, lower lobe ([place])   10/15/2020 Cancer Staging   Staging form: Lung, AJCC 8th Edition - Clinical stage from 10/15/2020: Stage IV (cT1b, cN0, cM1) - Signed by Derwood Kaplan, MD on 03/31/2021 Histopathologic type: Squamous cell carcinoma, NOS Stage prefix: Initial diagnosis Histologic grade (G): G3 Histologic grading system: 4 grade system Laterality: Right Tumor size (mm): 18 Sites of metastasis: Brain Lymph-vascular invasion (LVI): Presence of LVI unknown/indeterminate Diagnostic confirmation: Positive histology Specimen type: Core Needle Biopsy Staged by: Managing physician Type of lung cancer: Locally advanced or metastatic non-small cell lung cancer Stage used in treatment planning: Yes National guidelines used in treatment planning: Yes Type of national guideline used in treatment planning: NCCN   04/05/2021 -  Chemotherapy   Patient is on Treatment Plan : LUNG NSCLC flat dose Pembrolizumab Q21D       Interval history-  Patient presents to chemo care clinic today for initial meeting in preparation for starting chemotherapy. I introduced the chemo care clinic and we discussed that the role of the clinic is to assist those who are at an increased risk of emergency room visits and/or complications during the course of chemotherapy treatment. We discussed that the increased risk takes into account  factors such as age, performance status, and co-morbidities. We also discussed that for some, this might include barriers to care such as not having a primary care provider, lack of insurance/transportation, or not being able to afford medications. We discussed that the goal of the program is to help prevent unplanned ER visits and help reduce complications during chemotherapy. We do this by discussing specific risk factors to each individual and identifying ways that we can help improve these risk factors and reduce barriers to care.   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   Persistent atrial fibrillation ([place]) 10/01/2016   Post op AFib after CABG 4/18 // Amiodarone and Coumadin started // Amiodarone stopped 11/2016   Stroke [facility])     Past Surgical History:  Procedure Laterality Date   CHOLECYSTECTOMY     CORONARY ARTERY BYPASS GRAFT N/A 10/06/2016   Procedure: CORONARY ARTERY BYPASS GRAFTING (CABG) x four, using internal mammary, and bilateral saphenous veins harvested endoscopically;  Surgeon: Melrose Nakayama, MD;  Location: [facility]  OR;  Service: Open Heart Surgery;  Laterality: N/A;   CORONARY BALLOON ANGIOPLASTY N/A 10/01/2016   Procedure: Coronary Balloon Angioplasty;  Surgeon: Belva Crome, MD;  Location: [place] CV LAB;  Service: Cardiovascular;  Laterality: N/A;   LEFT HEART CATH AND CORONARY ANGIOGRAPHY  N/A 10/01/2016   Procedure: Left Heart Cath and Coronary Angiography;  Surgeon: Belva Crome, MD;  Location: [place] CV LAB;  Service: Cardiovascular;  Laterality: N/A;   TEE WITHOUT CARDIOVERSION N/A 10/06/2016   Procedure: TRANSESOPHAGEAL ECHOCARDIOGRAM (TEE);  Surgeon: Melrose Nakayama, MD;  Location: [place];  Service: Open Heart Surgery;  Laterality: N/A;    Social History   Socioeconomic History   Marital status: Divorced    Spouse name: Not on file   Number of children: Not on file   Years of education: Not on file   Highest education level: Not on file  Occupational History   Not on file  Tobacco Use   Smoking status: Every Day    Packs/day: 1.00    Years: 60.00    Pack years: 60.00    Types: Cigarettes    Last attempt to quit: 01/21/2021    Years since quitting: 0.1   Smokeless tobacco: Never  Vaping Use   Vaping Use: Never used  Substance and Sexual Activity   Alcohol use: No   Drug use: No   Sexual activity: Not Currently  Other Topics Concern   Not on file  Social History Narrative   Not on file   Social Determinants of Health   Financial Resource Strain: Not on file  Food Insecurity: Not on file  Transportation Needs: Not on file  Physical Activity: Not on file  Stress: Not on file  Social Connections: Not on file  Intimate Partner Violence: Not on file    Family History  Problem Relation Age of Onset   Tuberculosis Mother    Cancer Father 25       unknown type   Leukemia Sister    Tuberculosis Sister    Cancer Sister        liver   Cancer Brother 95   Cancer Brother    Heart attack Brother 72   Cancer Brother      Current Outpatient Medications:    ALPRAZolam (XANAX) 0.5 MG tablet, Take 1 tablet (0.5 mg total) by mouth 3 (three) times daily as needed for anxiety., Disp: 90 tablet, Rfl: 0   atorvastatin (LIPITOR) 80 MG tablet, Take 80 mg by mouth daily., Disp: , Rfl:    brimonidine (ALPHAGAN) 0.2 % ophthalmic solution, in the morning  and at bedtime., Disp: , Rfl:    brimonidine-timolol (COMBIGAN) 0.2-0.5 % ophthalmic solution, Place 1 drop into the right eye 2 (two) times daily., Disp: , Rfl:    dexamethasone (DECADRON) 4 MG tablet, Take 4 mg by mouth daily as needed., Disp: , Rfl:    glipiZIDE (GLUCOTROL XL) 5 MG 24 hr tablet, Take 5 mg by mouth daily., Disp: , Rfl:    HUMULIN R 100 UNIT/ML injection, , Disp: , Rfl:    JANUVIA 100 MG tablet, Take 100 mg by mouth daily., Disp: , Rfl:    LORazepam (ATIVAN) 0.5 MG tablet, Take 1 tablet (0.5 mg total) by mouth at bedtime., Disp: 30 tablet, Rfl: 0   losartan (COZAAR) 25 MG tablet, Take 25 mg by mouth daily., Disp: , Rfl:    metoprolol tartrate (LOPRESSOR) 50 MG tablet, Take 50 mg by mouth as needed., Disp: ,  Rfl:    mupirocin cream (BACTROBAN) 2 %, Apply topically., Disp: , Rfl:    ofloxacin (OCUFLOX) 0.3 % ophthalmic solution, Place into the left eye., Disp: , Rfl:    omeprazole (PRILOSEC) 40 MG capsule, Take 40 mg by mouth every morning., Disp: , Rfl:    ondansetron (ZOFRAN) 4 MG tablet, Take 1 tablet (4 mg total) by mouth every 4 (four) hours as needed for nausea., Disp: 90 tablet, Rfl: 3   prednisoLONE acetate (PRED FORTE) 1 % ophthalmic suspension, Place into the left eye., Disp: , Rfl:    prochlorperazine (COMPAZINE) 10 MG tablet, Take 1 tablet (10 mg total) by mouth every 6 (six) hours as needed for nausea or vomiting., Disp: 90 tablet, Rfl: 3   rivaroxaban (XARELTO) 20 MG TABS tablet, Take 20 mg by mouth daily with supper., Disp: , Rfl:    traMADol (ULTRAM) 50 MG tablet, Take 1 tablet (50 mg total) by mouth every 6 (six) hours as needed., Disp: 30 tablet, Rfl: 0  CMP Latest Ref Rng & Units 03/09/2021  Glucose 65 - 99 mg/dL -  BUN 4 - 21 16  Creatinine 0.5 - 1.1 0.8  Sodium 137 - 147 138  Potassium 3.4 - 5.3 3.6  Chloride 99 - 108 102  CO2 13 - 22 25(A)  Calcium 8.7 - 10.7 9.6  Total Protein 6.0 - 8.3 g/dL -  Total Bilirubin 0.3 - 1.2 mg/dL -  Alkaline Phos 25 -  125 63  AST 13 - 35 28  ALT 7 - 35 15   CBC Latest Ref Rng & Units 03/09/2021  WBC - 4.7  Hemoglobin 12.0 - 16.0 12.6  Hematocrit 36 - 46 38  Platelets 150 - 399 190    No images are attached to the encounter.  No results found.   Assessment and plan- Patient is a 75 y.o. female who presents to [place] [facility] for initial meeting in preparation for starting chemotherapy for the treatment of lung cancer.   Chemo Care Clinic/High Risk for ER/Hospitalization during chemotherapy- We discussed the role of the chemo care clinic and identified patient specific risk factors. I discussed that patient was identified as high risk primarily based on:  Patient has past medical history positive for: Past Medical History:  Diagnosis Date   Arthritis    Asthma    CAD (coronary artery disease) 09/09/2010   S/p multiple PCIs to RCA, LAD, LCx // s/p inf STEMI 4/18 >> POBA to mRCA followed by CABG (L-LAD, S-D1, S-OM, S-PDA) // Intraoperative TEE 4/18: EF 50-55, no RWMA   Diabetes mellitus    Diabetic nephropathy (HCC)    stage II   Diabetic neuropathy (HCC)    GERD (gastroesophageal reflux disease)    History of MI (myocardial infarction) 05/2002   Hyperlipidemia 09/09/2010       Hypertension    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   Persistent atrial fibrillation ([place]) 10/01/2016   Post op AFib after CABG 4/18 // Amiodarone and Coumadin started // Amiodarone stopped 11/2016   Stroke [facility])     Patient has past surgical history positive for: Past Surgical History:  Procedure Laterality Date   CHOLECYSTECTOMY     CORONARY ARTERY BYPASS GRAFT N/A 10/06/2016   Procedure: CORONARY ARTERY BYPASS GRAFTING (CABG) x four, using internal mammary, and bilateral saphenous veins harvested endoscopically;  Surgeon: Melrose Nakayama, MD;  Location: [place];  Service: Open Heart Surgery;  Laterality: N/A;   CORONARY BALLOON  ANGIOPLASTY N/A 10/01/2016   Procedure: Coronary Balloon Angioplasty;   Surgeon: Belva Crome, MD;  Location: [place] CV LAB;  Service: Cardiovascular;  Laterality: N/A;   LEFT HEART CATH AND CORONARY ANGIOGRAPHY N/A 10/01/2016   Procedure: Left Heart Cath and Coronary Angiography;  Surgeon: Belva Crome, MD;  Location: [place] CV LAB;  Service: Cardiovascular;  Laterality: N/A;   TEE WITHOUT CARDIOVERSION N/A 10/06/2016   Procedure: TRANSESOPHAGEAL ECHOCARDIOGRAM (TEE);  Surgeon: Melrose Nakayama, MD;  Location: [place];  Service: Open Heart Surgery;  Laterality: N/A;   The patient is a with newly diagnosed lung cancer.  Patient presents to clinic today for chemotherapy education and palliative care consult.  We will start pembrolizumab.  We will send in prescriptions for prochlorperazine and ondansetron.  The patient verbalizes understanding of and agreement to the plan as discussed today.  Provided general information including the following: 1.  Date of education: 04/01/2021 2.  Physician name: Dr. Hinton Rao 3.  Diagnosis: Lung Cancer 4.  Stage: Stage IV 5.  Palliative 6.  Chemotherapy plan including drugs and how often: Pembrolizumab IV every 3 weeks 7.  Start date: 04/08/2021 8.  Other referrals: None at this time 9.  The patient is to call our office with any questions or concerns.  Our office number [phone], if after hours or on the weekend, call the same number and wait for the answering service.  There is always an oncologist on call 10.  Medications prescribed: Ondansetron, Prochlorperazine, Xanax, Tramadol 11.  The patient has verbalized understanding of the treatment plan and has no barriers to adherence or understanding.  Obtained signed consent from patient.  Discussed symptoms including 1.  Low blood counts including red blood cells, white blood cells and platelets. 2. Infection including to avoid large crowds, wash hands frequently, and stay away from people who were sick.  If fever develops of 100.4 or higher, call our office. 3.   Mucositis-given instructions on mouth rinse (baking soda and salt mixture).  Keep mouth clean.  Use soft bristle toothbrush.  If mouth sores develop, call our clinic. 4.  Nausea/vomiting-gave prescriptions for ondansetron 4 mg every 4 hours as needed for nausea, may take around the clock if persistent.  Compazine 10 mg every 6 hours, may take around the clock if persistent. 5.  Diarrhea-use over-the-counter Imodium.  Call clinic if not controlled. 6.  Constipation-use senna, 1 to 2 tablets twice a day.  If no BM in 2 to 3 days call the clinic. 7.  Loss of appetite-try to eat small meals every 2-3 hours.  Call clinic if not eating. 8.  Taste changes-zinc 500 mg daily.  If becomes severe call clinic. 9.  Alcoholic beverages. 10.  Drink 2 to 3 quarts of water per day. 11.  Peripheral neuropathy-patient to call if numbness or tingling in hands or feet is persistent   Answered questions to patient satisfaction.  Patient is to call with any further questions or concerns.   The medication prescribed to the patient will be printed out from Epic This will give the following information: Name of your medication Approved uses Dose and schedule Storage and handling Handling body fluids and waste Drug and food interactions Possible side effects and management Pregnancy, sexual activity, and contraception Obtaining medication   We discussed that social determinants of health may have significant impacts on health and outcomes for cancer patients.  Today we discussed specific social determinants of performance status, alcohol use, depression, financial needs,  food insecurity, housing, interpersonal violence, social connections, stress, tobacco use, and transportation.    After lengthy discussion the following were identified as areas of need:   Outpatient services: We discussed options including home based and outpatient services, DME and care program. We discusssed that patients who participate in  regular physical activity report fewer negative impacts of cancer and treatments and report less fatigue.   Financial Concerns: We discussed that living with cancer can create tremendous financial burden.  We discussed options for assistance. I asked that if assistance is needed in affording medications or paying bills to please let us know so that we can provide assistance. We discussed options for food including social services, [employer] ($50 every 2 weeks) and onsite food pantry.  We will also notify [facility] to see if cancer center can provide additional support.  Referral to Social work: Introduced Education officer, museum Elease Etienne and the services he can provide such as support with utility bill, cell phone and gas vouchers.   Support groups: We discussed options for support groups at the cancer center. If interested, please notify nurse navigator to enroll. We discussed options for managing stress including healthy eating, exercise as well as participating in no charge counseling services at the cancer center and support groups.  If these are of interest, patient can notify either myself or primary nursing team.We discussed options for management including medications and referral to quit Smart program  Transportation: We discussed options for transportation including [employer], paratransit, bus routes, link transit, taxi/uber/lyft, and cancer center van.  I have notified primary oncology team who will help assist with arranging Lucianne Lei transportation for appointments when/if needed. We also discussed options for transportation on short notice/acute visits.  Palliative care services: We have palliative care services available in the cancer center to discuss goals of care and advanced care planning.  Please let us know if you have any questions or would like to speak to our palliative nurse practitioner.  Symptom Management Clinic: We discussed our symptom management clinic which is available for  acute concerns while receiving treatment such as nausea, vomiting or diarrhea.  We can be reached via telephone at [phone] or through my chart.  We are available for virtual or in person visits on the same day from 830 to 4 PM Monday through Friday. She denies needing specific assistance at this time and She will be followed by Dr. Hinton Rao clinical team.  Plan: Discussed symptom management clinic. Discussed palliative care services. Discussed resources that are available here at the cancer center. Discussed medications and new prescriptions to begin treatment such as anti-nausea or steroids.   Disposition: RTC on   Visit Diagnosis No diagnosis found.  Patient expressed understanding and was in agreement with this plan. She also understands that She can call clinic at any time with any questions, concerns, or complaints.   I provided 30 minutes of  face to face  during this encounter, and > 50% was spent counseling as documented under my assessment & plan.   Dayton Scrape, FNP- [facility]

## 2021-04-01 NOTE — Telephone Encounter (Signed)
Chemo Edu

## 2021-04-03 LAB — CBC AND DIFFERENTIAL
HCT: 42 (ref 36–46)
Hemoglobin: 13.6 (ref 12.0–16.0)
Neutrophils Absolute: 3.7
Platelets: 209 (ref 150–399)
WBC: 7.2

## 2021-04-04 ENCOUNTER — Ambulatory Visit: Payer: Medicare Other

## 2021-04-04 ENCOUNTER — Other Ambulatory Visit: Payer: Self-pay | Admitting: Pharmacist

## 2021-04-04 DIAGNOSIS — C3431 Malignant neoplasm of lower lobe, right bronchus or lung: Secondary | ICD-10-CM

## 2021-04-05 ENCOUNTER — Ambulatory Visit: Payer: Medicare Other

## 2021-04-07 ENCOUNTER — Encounter: Payer: Self-pay | Admitting: Oncology

## 2021-04-07 ENCOUNTER — Telehealth: Payer: Self-pay | Admitting: Oncology

## 2021-04-07 NOTE — Telephone Encounter (Signed)
Patient's daughter called in to Verify 10/14 Labs, Inf - All at [facility] per Manuela Schwartz

## 2021-04-08 ENCOUNTER — Other Ambulatory Visit: Payer: Self-pay | Admitting: Pharmacist

## 2021-04-08 ENCOUNTER — Inpatient Hospital Stay: Payer: Medicare Other

## 2021-04-08 ENCOUNTER — Other Ambulatory Visit: Payer: Self-pay | Admitting: Hematology and Oncology

## 2021-04-08 ENCOUNTER — Other Ambulatory Visit: Payer: Self-pay

## 2021-04-08 VITALS — BP 141/74 | HR 68 | Temp 98.1°F | Resp 18 | Ht 67.0 in | Wt 123.0 lb

## 2021-04-08 DIAGNOSIS — Z79899 Other long term (current) drug therapy: Secondary | ICD-10-CM | POA: Diagnosis not present

## 2021-04-08 DIAGNOSIS — C7931 Secondary malignant neoplasm of brain: Secondary | ICD-10-CM | POA: Diagnosis present

## 2021-04-08 DIAGNOSIS — C3431 Malignant neoplasm of lower lobe, right bronchus or lung: Secondary | ICD-10-CM

## 2021-04-08 DIAGNOSIS — Z5112 Encounter for antineoplastic immunotherapy: Secondary | ICD-10-CM | POA: Diagnosis not present

## 2021-04-08 LAB — HEPATIC FUNCTION PANEL
ALT: 18 (ref 7–35)
AST: 32 (ref 13–35)
Alkaline Phosphatase: 73 (ref 25–125)
Bilirubin, Total: 0.5

## 2021-04-08 LAB — BASIC METABOLIC PANEL
BUN: 12 (ref 4–21)
CO2: 26 — AB (ref 13–22)
Chloride: 105 (ref 99–108)
Creatinine: 0.8 (ref 0.5–1.1)
Glucose: 172
Potassium: 3.9 (ref 3.4–5.3)
Sodium: 140 (ref 137–147)

## 2021-04-08 LAB — TSH: TSH: 2.235 u[IU]/mL (ref 0.350–4.500)

## 2021-04-08 LAB — COMPREHENSIVE METABOLIC PANEL
Albumin: 4.4 (ref 3.5–5.0)
Calcium: 9.5 (ref 8.7–10.7)

## 2021-04-08 LAB — CBC AND DIFFERENTIAL
HCT: 41 (ref 36–46)
Hemoglobin: 13.5 (ref 12.0–16.0)
Neutrophils Absolute: 3.43
Platelets: 209 (ref 150–399)
WBC: 7.3

## 2021-04-08 LAB — CBC
MCV: 90 (ref 81–99)
RBC: 4.51 (ref 3.87–5.11)

## 2021-04-08 MED ORDER — SODIUM CHLORIDE 0.9 % IV SOLN: Freq: Once | INTRAVENOUS | Status: AC

## 2021-04-08 MED ORDER — SODIUM CHLORIDE 0.9 % IV SOLN
200.0000 mg | Freq: Once | INTRAVENOUS | Status: AC
  Administered 2021-04-08: 200 mg via INTRAVENOUS
  Filled 2021-04-08: qty 8

## 2021-04-08 MED ORDER — HEPARIN SOD (PORK) LOCK FLUSH 100 UNIT/ML IV SOLN: 500.0000 [IU] | Freq: Once | INTRAVENOUS | Status: DC | PRN

## 2021-04-08 MED ORDER — SODIUM CHLORIDE 0.9% FLUSH: 10.0000 mL | INTRAVENOUS | Status: DC | PRN

## 2021-04-08 NOTE — Patient Instructions (Signed)
Pembrolizumab injection What is this medication? PEMBROLIZUMAB (pem broe liz ue mab) is a monoclonal antibody. It is used to treat certain types of cancer. This medicine may be used for other purposes; ask your health care provider or pharmacist if you have questions. COMMON BRAND NAME(S): Keytruda What should I tell my care team before I take this medication? They need to know if you have any of these conditions: autoimmune diseases like Crohn's disease, ulcerative colitis, or lupus have had or planning to have an allogeneic stem cell transplant (uses someone else's stem cells) history of organ transplant history of chest radiation nervous system problems like myasthenia gravis or Guillain-Barre syndrome an unusual or allergic reaction to pembrolizumab, other medicines, foods, dyes, or preservatives pregnant or trying to get pregnant breast-feeding How should I use this medication? This medicine is for infusion into a vein. It is given by a health care professional in a hospital or clinic setting. A special MedGuide will be given to you before each treatment. Be sure to read this information carefully each time. Talk to your pediatrician regarding the use of this medicine in children. While this drug may be prescribed for children as young as 6 months for selected conditions, precautions do apply. Overdosage: If you think you have taken too much of this medicine contact a poison control center or emergency room at once. NOTE: This medicine is only for you. Do not share this medicine with others. What if I miss a dose? It is important not to miss your dose. Call your doctor or health care professional if you are unable to keep an appointment. What may interact with this medication? Interactions have not been studied. This list may not describe all possible interactions. Give your health care provider a list of all the medicines, herbs, non-prescription drugs, or dietary supplements you use.  Also tell them if you smoke, drink alcohol, or use illegal drugs. Some items may interact with your medicine. What should I watch for while using this medication? Your condition will be monitored carefully while you are receiving this medicine. You may need blood work done while you are taking this medicine. Do not become pregnant while taking this medicine or for 4 months after stopping it. Women should inform their doctor if they wish to become pregnant or think they might be pregnant. There is a potential for serious side effects to an unborn child. Talk to your health care professional or pharmacist for more information. Do not breast-feed an infant while taking this medicine or for 4 months after the last dose. What side effects may I notice from receiving this medication? Side effects that you should report to your doctor or health care professional as soon as possible: allergic reactions like skin rash, itching or hives, swelling of the face, lips, or tongue bloody or black, tarry breathing problems changes in vision chest pain chills confusion constipation cough diarrhea dizziness or feeling faint or lightheaded fast or irregular heartbeat fever flushing joint pain low blood counts - this medicine may decrease the number of white blood cells, red blood cells and platelets. You may be at increased risk for infections and bleeding. muscle pain muscle weakness pain, tingling, numbness in the hands or feet persistent headache redness, blistering, peeling or loosening of the skin, including inside the mouth signs and symptoms of high blood sugar such as dizziness; dry mouth; dry skin; fruity breath; nausea; stomach pain; increased hunger or thirst; increased urination signs and symptoms of kidney injury like trouble  passing urine or change in the amount of urine signs and symptoms of liver injury like dark urine, light-colored stools, loss of appetite, nausea, right upper belly pain,  yellowing of the eyes or skin sweating swollen lymph nodes weight loss Side effects that usually do not require medical attention (report to your doctor or health care professional if they continue or are bothersome): decreased appetite hair loss tiredness This list may not describe all possible side effects. Call your doctor for medical advice about side effects. You may report side effects to FDA at 1-800-FDA-1088. Where should I keep my medication? This drug is given in a hospital or clinic and will not be stored at home. NOTE: This sheet is a summary. It may not cover all possible information. If you have questions about this medicine, talk to your doctor, pharmacist, or health care provider.  2022 Elsevier/Gold Standard (2019-05-14 21:44:53)

## 2021-04-10 LAB — T4: T4, Total: 8 ug/dL (ref 4.5–12.0)

## 2021-04-11 ENCOUNTER — Telehealth: Payer: Self-pay

## 2021-04-11 NOTE — Telephone Encounter (Signed)
I spoke with pt's daughter. She states her mom hasn't had any side effects at all from the infusion on Friday. "She has been a little tired, but she has that daily". No N/V, diarrhea, nor fevers. I encouraged the daughter to call us if her mom develops any signs of infection of any kind (respiratory, urinary trach,et). I reminded her of the importance of calling us with temp of 100.4 or higher, day or night. She verbalized understanding. I confirmed next appt.

## 2021-04-14 ENCOUNTER — Ambulatory Visit: Payer: Medicare Other | Admitting: Hematology and Oncology

## 2021-04-14 ENCOUNTER — Other Ambulatory Visit: Payer: Medicare Other

## 2021-04-14 NOTE — Progress Notes (Signed)
[facility]  [address] [place],  [place]  [address] [phone]  Clinic Day:  04/19/2021  Referring physician: Townsend Roger, MD  ASSESSMENT & PLAN:   Assessment & Plan: Lung cancer, lower lobe [facility]) She is receiving palliative pembrolizumab and tolerated her 1st cycle well. We will plan to see her back on October 31st with a CBC and comprehensive metabolic panel prior to a second cycle of pembrolizumab.   Brain metastases [facility]) Brain metastases diagnosed in April 2022.  This presented with right sided head pain and pain of the right eye.  She received stereotactic radiation to the two brain lesions. MRI brain from September 26th revealed four metastatic deposits in the brain. Several show mild associated hemorrhage. Mild edema. No mass-effect or midline shift. We will continue to monitor these.  Constipation She reports severe constipation with no bowel movement for a month. She doesn't have signs of obstruction, so I will have her using magnesium citrate today to get her bowels moving as soon as possible. I would then recommend using senna-S 2 tabs at least at bedtime and twice daily if needed to keep her bowels regular.  Shoulder pain, right Worsening right shoulder pain. 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 We will evaluate with x-ray today and if not diagnostic, will proceed with MRI shoulder.  She reports severe itching with hydrocodone and is on anticoagulation so will avoid NSAID's. As her pain is severe, I will have her stop tramadol and we will try oxycodone 5 mg every 4 hours as needed. I advised her against taking more than 2 Tylenol 3 times a day. She knows to be careful ambulating when she takes the oxycodone as it can make her sleepy. If she has trouble tolerating this, she will contact us.   Right shoulder x-ray reveals diffusely decreased mineralization of the bones. No acute fracture or  dislocation. Hypertrophic degenerative changes present at the acromioclavicular joint. Mild degenerative changes at the glenohumeral joint. Sternotomy wires are noted over the midline. The soft tissues are within normal limits.   As we do not feel the degenerative changes explain her severe pain, we recommend proceeding with MRI right shoulder.   The patient understands the plans discussed today and is in agreement with them.  She knows to contact our office if she develops concerns prior to her next appointment.      Raven Pickles, PA-C  [facility] [address] [place] [address] Dept: [phone] Dept Fax: [phone]   Orders Placed This Encounter  Procedures   DG Shoulder Right    Standing Status:   Future    Standing Expiration Date:   04/18/2022    Order Specific Question:   Reason for Exam (SYMPTOM  OR DIAGNOSIS REQUIRED)    Answer:   right shoulder pain, h/o lung cancer    Order Specific Question:   Preferred imaging location?    Answer:   External       CHIEF COMPLAINT:  CC: Metastatic lung cancer to brain  Current Treatment:  Palliative pembrolizumab   HISTORY OF PRESENT ILLNESS:  [name] is a 75 y.o. female referred by Dr. Vassie Loll [name] for a transfer of care and continued management of metastatic lung cancer to the brain.  She states that her lung cancer first manifested as trouble of the right eye and pain which  radiated to the right side of her head. MRI imaging in April 2022 revealed intrinsic hyperintense T1 signal lesions within the right cerebrum demonstrate postcontrast enhancement consistent with hemorrhagic metastases.  One lesion was in the right occipital lobe and one in the posterior right frontal lobe.  She did have evidence of vasogenic edema and was placed on dexamethasone.  She also had evidence of multiple lacunar infarctions of the basal ganglia.  PET in May confirmed  a hypermetabolic nodule in the right lower lobe consistent with bronchogenic carcinoma.  No evidence of other metastatic disease was observed.  She received 1 treatment of chemotherapy (possibly cisplatin/paclitaxel/pembrolizumab). She also underwent stereotactic radiation to the two brain lesions, which was completed in early June.  The patient struggled with chemotherapy and radiation and had to be hospitalized twice due to her severe weakness.  She declined to pursue further chemotherapy after that experience.  She moved from [place] to [place] in July to live with her daughter.  We only have limited records from her oncologist in [place]. Review of her pathology revealed poorly differentiated carcinoma consistent with lung primary and favoring squamous cell histology. The results of her Guardant testing were tracked down, which was negative for any targetable mutations. She has slowly improved to the point we felt she could consider additional therapy.   CT chest from September 14th revealed a spiculated right lower lobe pulmonary nodule measuring 2.0 x 1.9 cm, initially 1.8 cm, which likely represents the site of known bronchogenic neoplasm. Also with findings suspicious for right infrahilar nodal involvement despite small size. Smaller nodular foci in the right lower lobe suspicious for multifocal involvement, of uncertain significance in the absence of priors. Small nodules also in the contralateral lung near the apex are indeterminate. There is a 13 mm left adrenal nodule which is well-circumscribed and within 1-2 mm of its size in 2018 not present in 2011. Findings more likely reflect adrenal adenoma.  MRI brain from September 26th revealed four metastatic deposits in the brain, 7 mm or less. Several show mild associated hemorrhage and mild edema. No mass-effect or midline shift. These results were reviewed with the patient. Chronic lacunar infarctions of the left side were seen. She is on  anticoagulation with rivaroxaban. She was placed on palliative pembrolizumb and had her 1st cycle on October 14th.   Oncology History  Lung cancer, lower lobe ([place])  07/07/2020 Initial Diagnosis   Lung cancer, lower lobe ([place])   10/15/2020 Cancer Staging   Staging form: Lung, AJCC 8th Edition - Clinical stage from 10/15/2020: Stage IV (cT1b, cN0, cM1) - Signed by Derwood Kaplan, MD on 03/31/2021 Histopathologic type: Squamous cell carcinoma, NOS Stage prefix: Initial diagnosis Histologic grade (G): G3 Histologic grading system: 4 grade system Laterality: Right Tumor size (mm): 18 Sites of metastasis: Brain Lymph-vascular invasion (LVI): Presence of LVI unknown/indeterminate Diagnostic confirmation: Positive histology Specimen type: Core Needle Biopsy Staged by: Managing physician Type of lung cancer: Locally advanced or metastatic non-small cell lung cancer Stage used in treatment planning: Yes National guidelines used in treatment planning: Yes Type of national guideline used in treatment planning: NCCN    04/08/2021 -  Chemotherapy   Patient is on Treatment Plan : LUNG NSCLC flat dose Pembrolizumab Q21D        INTERVAL HISTORY:  Aspin is here today for repeat clinical assessment after receiving her 1st cycle of pembrolizumab. She states she tolerated her 1st cycle without significant difficulty.  Her only complaint today is severe  right shoulder pain, which has been increasing, as well as severe constipation. She states she has had right shoulder pain for about 3 months, as well as decreased range of motion and weakness since her stroke. She has increased her tramadol to 2 tablets every 6 hours without relief of her pain. She is also using 2 to 4 Tylenol with the tramadol. I advised her against using more than 2 Tylenol 3 times a day. She has taken senna 2 tabs on several occasions without relief of the constipation. She denies fevers or chills. She reports right shoulder pain  down her arm, which she rates 8/10 today. Her appetite is decreased. Her weight has increased 4 pounds over last 10 days .  REVIEW OF SYSTEMS:  Review of Systems  Constitutional:  Positive for appetite change (decreased). Negative for chills, fatigue, fever and unexpected weight change.  HENT:   Negative for lump/mass, mouth sores and sore throat.   Respiratory:  Negative for cough and shortness of breath.   Cardiovascular:  Negative for chest pain and leg swelling.  Gastrointestinal:  Positive for constipation (states no BM for a month). Negative for abdominal pain, diarrhea, nausea and vomiting.  Endocrine: Negative for hot flashes.  Genitourinary:  Negative for difficulty urinating, dysuria, frequency and hematuria.   Musculoskeletal:  Positive for arthralgias (right shoulder). Negative for back pain and myalgias.  Skin:  Negative for rash.  Neurological:  Negative for dizziness and headaches.  Hematological:  Negative for adenopathy. Does not bruise/bleed easily.  Psychiatric/Behavioral:  Negative for depression and sleep disturbance. The patient is not nervous/anxious.     VITALS:  Blood pressure (!) 170/74, pulse 64, temperature 98.2 F (36.8 C), temperature source Oral, resp. rate 20, height 5\' 7"  (1.702 m), weight 127 lb 3.2 oz (57.7 kg), SpO2 96 %.  Wt Readings from Last 3 Encounters:  04/18/21 127 lb 3.2 oz (57.7 kg)  04/08/21 123 lb (55.8 kg)  04/01/21 122 lb 11.2 oz (55.7 kg)    Body mass index is 19.92 kg/m.  Performance status (ECOG): 2 - Symptomatic, <50% confined to bed  PHYSICAL EXAM:  Physical Exam Vitals and nursing note reviewed.  Constitutional:      General: She is not in acute distress.    Appearance: Normal appearance.  HENT:     Head: Normocephalic and atraumatic.     Mouth/Throat:     Mouth: Mucous membranes are moist.     Pharynx: Oropharynx is clear. No oropharyngeal exudate or posterior oropharyngeal erythema.  Eyes:     General: No scleral  icterus.    Extraocular Movements: Extraocular movements intact.     Conjunctiva/sclera: Conjunctivae normal.     Pupils: Pupils are equal, round, and reactive to light.  Cardiovascular:     Rate and Rhythm: Normal rate and regular rhythm.     Heart sounds: Normal heart sounds. No murmur heard.   No friction rub. No gallop.  Pulmonary:     Effort: Pulmonary effort is normal.     Breath sounds: Normal breath sounds. No wheezing, rhonchi or rales.  Abdominal:     General: There is no distension.     Palpations: Abdomen is soft. There is no hepatomegaly, splenomegaly or mass.     Tenderness: There is no abdominal tenderness.  Musculoskeletal:     Right shoulder: Tenderness: acromioclavicular joint. Decreased range of motion.     Cervical back: Normal range of motion and neck supple. No tenderness.     Right lower leg:  No edema.     Left lower leg: No edema.  Lymphadenopathy:     Cervical: No cervical adenopathy.     Upper Body:     Right upper body: No supraclavicular or axillary adenopathy.     Left upper body: No supraclavicular or axillary adenopathy.     Lower Body: No right inguinal adenopathy. No left inguinal adenopathy.  Skin:    General: Skin is warm and dry.     Coloration: Skin is not jaundiced.     Findings: No rash.  Neurological:     Mental Status: She is alert and oriented to person, place, and time.     Cranial Nerves: No cranial nerve deficit.  Psychiatric:        Mood and Affect: Mood normal.        Behavior: Behavior normal.        Thought Content: Thought content normal.    LABS:   CBC Latest Ref Rng & Units 04/18/2021 04/08/2021 03/09/2021  WBC - 6.8 7.3 4.7  Hemoglobin 12.0 - 16.0 12.7 13.5 12.6  Hematocrit 36 - 46 38 41 38  Platelets 150 - 399 192 209 190   CMP Latest Ref Rng & Units 04/18/2021 04/08/2021 03/09/2021  Glucose 65 - 99 mg/dL - - -  BUN 4 - 21 15 12 16   Creatinine 0.5 - 1.1 0.9 0.8 0.8  Sodium 137 - 147 140 140 138  Potassium 3.4 -  5.3 4.1 3.9 3.6  Chloride 99 - 108 107 105 102  CO2 13 - 22 25(A) 26(A) 25(A)  Calcium 8.7 - 10.7 9.2 9.5 9.6  Total Protein 6.0 - 8.3 g/dL - - -  Total Bilirubin 0.3 - 1.2 mg/dL - - -  Alkaline Phos 25 - 125 76 73 63  AST 13 - 35 37(A) 32 28  ALT 7 - 35 16 18 15      Lab Results  Component Value Date   CEA1 1.2 02/04/2021   /  CEA  Date Value Ref Range Status  02/04/2021 1.2 0.0 - 4.7 ng/mL Final    Comment:    (NOTE)                             Nonsmokers          <3.9                             Smokers             <5.6 [employer] Electrochemiluminescence Immunoassay (ECLIA) Values obtained with different assay methods or kits cannot be used interchangeably.  Results cannot be interpreted as absolute evidence of the presence or absence of malignant disease. Performed At: [facility] [place], [place] [id number] Rush Farmer MD IF:[id number]    No results found for: PSA1 No results found for: CAN199 No results found for: CAN125  No results found for: TOTALPROTELP, ALBUMINELP, A1GS, A2GS, BETS, BETA2SER, GAMS, MSPIKE, SPEI Lab Results  Component Value Date   TIBC 364 02/10/2021   FERRITIN 22 02/10/2021   IRONPCTSAT 17 02/10/2021   No results found for: LDH  STUDIES:  No results found.    HISTORY:   Past Medical History:  Diagnosis Date   Arthritis    Asthma    CAD (coronary artery disease) 09/09/2010   S/p multiple PCIs to RCA, LAD, LCx // s/p inf STEMI  4/18 >> POBA to mRCA followed by CABG (L-LAD, S-D1, S-OM, S-PDA) // Intraoperative TEE 4/18: EF 50-55, no RWMA   Constipation 04/18/2021   Diabetes mellitus    Diabetic nephropathy (HCC)    stage II   Diabetic neuropathy (HCC)    GERD (gastroesophageal reflux disease)    History of MI (myocardial infarction) 05/2002   Hyperlipidemia 09/09/2010       Hypertension    Lung cancer ([place])    with brain metastases   Persistent atrial fibrillation ([place]) 10/01/2016   Post op  AFib after CABG 4/18 // Amiodarone and Coumadin started // Amiodarone stopped 11/2016   Shoulder pain, right 04/18/2021   Stroke [facility])     Past Surgical History:  Procedure Laterality Date   CHOLECYSTECTOMY     CORONARY ARTERY BYPASS GRAFT N/A 10/06/2016   Procedure: CORONARY ARTERY BYPASS GRAFTING (CABG) x four, using internal mammary, and bilateral saphenous veins harvested endoscopically;  Surgeon: Melrose Nakayama, MD;  Location: [facility];  Service: Open Heart Surgery;  Laterality: N/A;   CORONARY BALLOON ANGIOPLASTY N/A 10/01/2016   Procedure: Coronary Balloon Angioplasty;  Surgeon: Belva Crome, MD;  Location: [employer] CV LAB;  Service: Cardiovascular;  Laterality: N/A;   LEFT HEART CATH AND CORONARY ANGIOGRAPHY N/A 10/01/2016   Procedure: Left Heart Cath and Coronary Angiography;  Surgeon: Belva Crome, MD;  Location: [facility] CV LAB;  Service: Cardiovascular;  Laterality: N/A;   TEE WITHOUT CARDIOVERSION N/A 10/06/2016   Procedure: TRANSESOPHAGEAL ECHOCARDIOGRAM (TEE);  Surgeon: Melrose Nakayama, MD;  Location: [facility];  Service: Open Heart Surgery;  Laterality: N/A;    Family History  Problem Relation Age of Onset   Tuberculosis Mother    Cancer Father 28       unknown type   Leukemia Sister    Tuberculosis Sister    Cancer Sister        liver   Cancer Brother 71   Cancer Brother    Heart attack Brother 29   Cancer Brother     Social History:  reports that she has been smoking cigarettes. She has a 60.00 pack-year smoking history. She has never used smokeless tobacco. She reports that she does not drink alcohol and does not use drugs.The patient is accompanied by her daughter today.  Allergies:  Allergies  Allergen Reactions   Ergocalciferol Nausea And Vomiting    Current Medications: Current Outpatient Medications  Medication Sig Dispense Refill   oxyCODONE (OXY IR/ROXICODONE) 5 MG immediate release tablet Take 1 tablet (5 mg total) by mouth every 4 (four)  hours as needed for severe pain. 30 tablet 0   ALPRAZolam (XANAX) 0.5 MG tablet Take 1 tablet (0.5 mg total) by mouth 3 (three) times daily as needed for anxiety. 90 tablet 0   atorvastatin (LIPITOR) 80 MG tablet Take 80 mg by mouth daily.     brimonidine (ALPHAGAN) 0.2 % ophthalmic solution in the morning and at bedtime. (Patient not taking: Reported on 04/18/2021)     brimonidine-timolol (COMBIGAN) 0.2-0.5 % ophthalmic solution Place 1 drop into the right eye 2 (two) times daily. (Patient not taking: Reported on 04/18/2021)     dexamethasone (DECADRON) 4 MG tablet Take 4 mg by mouth daily as needed.     glipiZIDE (GLUCOTROL XL) 5 MG 24 hr tablet Take 5 mg by mouth daily.     HUMULIN R 100 UNIT/ML injection      JANUVIA 100 MG tablet Take 100 mg by mouth daily.  LORazepam (ATIVAN) 0.5 MG tablet Take 1 tablet (0.5 mg total) by mouth at bedtime. 30 tablet 0   losartan (COZAAR) 25 MG tablet Take 25 mg by mouth daily.     metoprolol tartrate (LOPRESSOR) 50 MG tablet Take 50 mg by mouth as needed.     mupirocin cream (BACTROBAN) 2 % Apply topically.     ofloxacin (OCUFLOX) 0.3 % ophthalmic solution Place into the left eye. (Patient not taking: Reported on 04/18/2021)     omeprazole (PRILOSEC) 40 MG capsule Take 40 mg by mouth every morning.     ondansetron (ZOFRAN) 4 MG tablet Take 1 tablet (4 mg total) by mouth every 4 (four) hours as needed for nausea. 90 tablet 3   prednisoLONE acetate (PRED FORTE) 1 % ophthalmic suspension Place into the left eye.     prochlorperazine (COMPAZINE) 10 MG tablet Take 1 tablet (10 mg total) by mouth every 6 (six) hours as needed for nausea or vomiting. 90 tablet 3   rivaroxaban (XARELTO) 20 MG TABS tablet Take 20 mg by mouth daily with supper.     No current facility-administered medications for this visit.

## 2021-04-14 NOTE — Assessment & Plan Note (Signed)
Brain metastases diagnosed in April 2022.  This presented with right sided head pain and pain of the right eye.  She received stereotactic radiation to the two brain lesions. MRI brain from September 26th revealed four metastatic deposits in the brain. Several show mild associated hemorrhage. Mild edema. No mass-effect or midline shift. We will continue to monitor these.

## 2021-04-14 NOTE — Assessment & Plan Note (Signed)
She is receiving palliative pembrolizumab and tolerated her 1st cycle well. We will plan to see her back on October 31st with a CBC and comprehensive metabolic panel prior to a second cycle of pembrolizumab.

## 2021-04-18 ENCOUNTER — Other Ambulatory Visit: Payer: Self-pay | Admitting: Hematology and Oncology

## 2021-04-18 ENCOUNTER — Other Ambulatory Visit: Payer: Self-pay | Admitting: Pharmacist

## 2021-04-18 ENCOUNTER — Inpatient Hospital Stay: Payer: Medicare Other

## 2021-04-18 ENCOUNTER — Encounter: Payer: Self-pay | Admitting: Hematology and Oncology

## 2021-04-18 ENCOUNTER — Other Ambulatory Visit: Payer: Self-pay

## 2021-04-18 ENCOUNTER — Inpatient Hospital Stay (INDEPENDENT_AMBULATORY_CARE_PROVIDER_SITE_OTHER): Payer: Medicare Other | Admitting: Hematology and Oncology

## 2021-04-18 ENCOUNTER — Telehealth: Payer: Self-pay | Admitting: Hematology and Oncology

## 2021-04-18 VITALS — BP 170/74 | HR 64 | Temp 98.2°F | Resp 20 | Ht 67.0 in | Wt 127.2 lb

## 2021-04-18 DIAGNOSIS — C7931 Secondary malignant neoplasm of brain: Secondary | ICD-10-CM

## 2021-04-18 DIAGNOSIS — Z5112 Encounter for antineoplastic immunotherapy: Secondary | ICD-10-CM | POA: Diagnosis not present

## 2021-04-18 DIAGNOSIS — C3431 Malignant neoplasm of lower lobe, right bronchus or lung: Secondary | ICD-10-CM | POA: Diagnosis not present

## 2021-04-18 DIAGNOSIS — K5903 Drug induced constipation: Secondary | ICD-10-CM

## 2021-04-18 DIAGNOSIS — M25511 Pain in right shoulder: Secondary | ICD-10-CM

## 2021-04-18 DIAGNOSIS — G8929 Other chronic pain: Secondary | ICD-10-CM

## 2021-04-18 DIAGNOSIS — K5900 Constipation, unspecified: Secondary | ICD-10-CM

## 2021-04-18 HISTORY — DX: Constipation, unspecified: K59.00

## 2021-04-18 HISTORY — DX: Pain in right shoulder: M25.511

## 2021-04-18 LAB — COMPREHENSIVE METABOLIC PANEL
Albumin: 4.1 (ref 3.5–5.0)
Calcium: 9.2 (ref 8.7–10.7)

## 2021-04-18 LAB — BASIC METABOLIC PANEL
BUN: 15 (ref 4–21)
CO2: 25 — AB (ref 13–22)
Chloride: 107 (ref 99–108)
Creatinine: 0.9 (ref 0.5–1.1)
Glucose: 102
Potassium: 4.1 (ref 3.4–5.3)
Sodium: 140 (ref 137–147)

## 2021-04-18 LAB — CBC AND DIFFERENTIAL
HCT: 38 (ref 36–46)
Hemoglobin: 12.7 (ref 12.0–16.0)
Neutrophils Absolute: 3.2
Platelets: 192 (ref 150–399)
WBC: 6.8

## 2021-04-18 LAB — CBC: RBC: 4.34 (ref 3.87–5.11)

## 2021-04-18 LAB — HEPATIC FUNCTION PANEL
ALT: 16 (ref 7–35)
AST: 37 — AB (ref 13–35)
Alkaline Phosphatase: 76 (ref 25–125)
Bilirubin, Total: 0.4

## 2021-04-18 LAB — TSH: TSH: 2.207 u[IU]/mL (ref 0.350–4.500)

## 2021-04-18 MED ORDER — OXYCODONE HCL 5 MG PO TABS: 5.0000 mg | ORAL_TABLET | ORAL | 0 refills | Status: DC | PRN

## 2021-04-18 NOTE — Assessment & Plan Note (Addendum)
Worsening right shoulder pain. 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 We will evaluate with x-ray today and if not diagnostic, will proceed with MRI shoulder.  She reports severe itching with hydrocodone and is on anticoagulation so will avoid NSAID's. As her pain is severe, I will have her stop tramadol and we will try oxycodone 5 mg every 4 hours as needed. I advised her against taking more than 2 Tylenol 3 times a day. She knows to be careful ambulating when she takes the oxycodone as it can make her sleepy. If she has trouble tolerating this, she will contact us.   Right shoulder x-ray reveals diffusely decreased mineralization of the bones. No acute fracture or dislocation. Hypertrophic degenerative changes present at the acromioclavicular joint. Mild degenerative changes at the glenohumeral joint. Sternotomy wires are noted over the midline. The soft tissues are within normal limits.   As we do not feel the degenerative changes explain her severe pain, we recommend proceeding with MRI right shoulder.

## 2021-04-18 NOTE — Assessment & Plan Note (Signed)
She reports severe constipation with no bowel movement for a month. She doesn't have signs of obstruction, so I will have her using magnesium citrate today to get her bowels moving as soon as possible. I would then recommend using senna-S 2 tabs at least at bedtime and twice daily if needed to keep her bowels regular.

## 2021-04-18 NOTE — Telephone Encounter (Signed)
Per 10/24 LOS, patient given order for X-Ray Shoulder - 10/28 Lab Appt.  Patient given Updated Appt Summary

## 2021-04-19 LAB — T4: T4, Total: 8.4 ug/dL (ref 4.5–12.0)

## 2021-04-19 NOTE — Progress Notes (Signed)
[facility]  [address] [place],  [place]  [address] [phone]  Clinic Day:  04/25/2021  Referring physician: Townsend Roger, MD  This document serves as a record of services personally performed by Hosie Poisson, MD. It was created on their behalf by [facility] E, a trained medical scribe. The creation of this record is based on the scribe's personal observations and the provider's statements to them.  ASSESSMENT & PLAN:   Assessment & Plan:  Lung cancer, lower lobe ([place]), diagnosed in April 2022 She received 1 dose of chemotherapy in June, with combination cisplatin/Taxol/pembrolizumab, and tolerated this very poorly and ended up in the hospital twice. She is now receiving palliative pembrolizumab and tolerated her 1st cycle well.   Brain metastases [facility]) Brain metastases diagnosed in April 2022.  This presented with right sided head pain and pain of the right eye.  She received stereotactic radiation to the two brain lesions. MRI brain from September 26th revealed four metastatic deposits in the brain. Several show mild associated hemorrhage. Mild edema. No mass-effect or midline shift. We will continue to monitor these.  History of stroke, October 2020 She has residual right sided weakness and numbness of the right upper extremity.  Significant cardiac disease with atrial fibrillation and history of coronary artery bypass She is on anticoagulation.  Constipation I recommended using senna-S 2 tabs at least at bedtime and twice daily if needed to keep her bowels regular.  Shoulder pain, right Worsening right shoulder pain. 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 Right shoulder x-ray revealed diffusely decreased mineralization of the bones. No acute fracture or dislocation. Hypertrophic degenerative changes present at the acromioclavicular joint. Mild degenerative changes at the glenohumeral joint.  Sternotomy wires are noted over the midline. The soft tissues are within normal limits. She has been scheduled for MRI shoulder on November 2nd.  I have tried to reassure her that I doubt it is caused by her malignancy, and rather more likely from degenerative changes. She has been placed on oxycodone 5 mg every 4 hours as needed. I advised her against taking more than 2 Tylenol 3 times a day.  Poor appetite and weight loss I will try her on Remeron 7.5 mg at bedtime to see if this improves.  She will proceed with a 2nd cycle of pembrolizumab on November 4th. She is scheduled for MRI of the right shoulder on Wednesday, and I will contact her with the results. As she continues to have decreased appetite and weight loss, I will try her on Remeron 7.5 mg at bedtime. Otherwise, we will plan to see her back in 3 weeks with a CBC and comprehensive metabolic panel prior to a 3rd cycle of pembrolizumab. The patient understands the plans discussed today and is in agreement with them.  She knows to contact our office if she develops concerns prior to her next appointment.   I provided 15 minutes of face-to-face time during this this encounter and > 50% was spent counseling as documented under my assessment and plan.    [facility] [address] [place] [address] Dept: [phone] Dept Fax: [phone]   No orders of the defined types were placed in this encounter.    CHIEF COMPLAINT:  CC: Metastatic lung cancer to brain  Current Treatment:  Palliative pembrolizumab   HISTORY OF PRESENT ILLNESS:  Keiarah Orlowski is a  75 y.o. female referred by Dr. 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  She states that her lung cancer first manifested as trouble of the right eye and pain which radiated to the right side of her head. MRI imaging in April 2022 revealed intrinsic  hyperintense T1 signal lesions within the right cerebrum demonstrate postcontrast enhancement consistent with hemorrhagic metastases.  One lesion was in the right occipital lobe and one in the posterior right frontal lobe.  She did have evidence of vasogenic edema and was placed on dexamethasone.  She also had evidence of multiple lacunar infarctions of the basal ganglia.  PET in May confirmed a hypermetabolic nodule in the right lower lobe consistent with bronchogenic carcinoma.  No evidence of other metastatic disease was observed.  She received 1 treatment of chemotherapy (possibly cisplatin/paclitaxel/pembrolizumab). She also underwent stereotactic radiation to the two brain lesions, which was completed in early June.  The patient struggled with chemotherapy and radiation and had to be hospitalized twice due to her severe weakness.  She declined to pursue further chemotherapy after that experience.  She moved from [place] to [place] in July to live with her daughter.  We only have limited records from her oncologist in [place]. Review of her pathology revealed poorly differentiated carcinoma consistent with lung primary and favoring squamous cell histology. The results of her Guardant testing were tracked down, which was negative for any targetable mutations. She has slowly improved to the point we felt she could consider additional therapy.   CT chest from September 14th revealed a spiculated right lower lobe pulmonary nodule measuring 2.0 x 1.9 cm, initially 1.8 cm, which likely represents the site of known bronchogenic neoplasm. Also with findings suspicious for right infrahilar nodal involvement despite small size. Smaller nodular foci in the right lower lobe suspicious for multifocal involvement, of uncertain significance in the absence of priors. Small nodules also in the contralateral lung near the apex are indeterminate. There is a 13 mm left adrenal nodule which is well-circumscribed and  within 1-2 mm of its size in 2018 not present in 2011. Findings more likely reflect adrenal adenoma.  MRI brain from September 26th revealed four metastatic deposits in the brain, 7 mm or less. Several show mild associated hemorrhage and mild edema. No mass-effect or midline shift. These results were reviewed with the patient. Chronic lacunar infarctions of the left side were seen. She is on anticoagulation with rivaroxaban. She was placed on palliative pembrolizumb and had her 1st cycle on October 14th.   Oncology History  Lung cancer, lower lobe ([place])  07/07/2020 Initial Diagnosis   Lung cancer, lower lobe ([place])   10/15/2020 Cancer Staging   Staging form: Lung, AJCC 8th Edition - Clinical stage from 10/15/2020: Stage IV (cT1b, cN0, cM1) - Signed by Derwood Kaplan, MD on 03/31/2021 Histopathologic type: Squamous cell carcinoma, NOS Stage prefix: Initial diagnosis Histologic grade (G): G3 Histologic grading system: 4 grade system Laterality: Right Tumor size (mm): 18 Sites of metastasis: Brain Lymph-vascular invasion (LVI): Presence of LVI unknown/indeterminate Diagnostic confirmation: Positive histology Specimen type: Core Needle Biopsy Staged by: Managing physician Type of lung cancer: Locally advanced or metastatic non-small cell lung cancer Stage used in treatment planning: Yes National guidelines used in treatment planning: Yes Type of national guideline used in treatment planning: NCCN    04/08/2021 -  Chemotherapy   Patient is on Treatment Plan : LUNG NSCLC flat dose Pembrolizumab Q21D  INTERVAL HISTORY:  [name] is here for follow up prior to a 2nd cycle of pembrolizumab. She tolerated her 1st dose without difficulty. She states that she is doing well, but continues to have pain of the right shoulder. She required increasing doses of tramadol without relief and so was placed on oxycodone 5 mg prn. She is scheduled for MRI on November 2nd. She also notes poor appetite  and has lost another 1 and 1/2 pounds. I will try her on low dose Remeron. Blood counts and chemistries are unremarkable. She denies fever, chills or other signs of infection.  She denies nausea, vomiting, bowel issues, or abdominal pain.  She denies sore throat, cough, dyspnea, or chest pain.  REVIEW OF SYSTEMS:  Review of Systems  Constitutional:  Positive for appetite change (poor appetite) and unexpected weight change (weight loss). Negative for chills, fatigue and fever.  HENT:  Negative.    Eyes: Negative.   Respiratory: Negative.  Negative for chest tightness, cough, hemoptysis, shortness of breath and wheezing.   Cardiovascular: Negative.  Negative for chest pain, leg swelling and palpitations.  Gastrointestinal: Negative.  Negative for abdominal distention, abdominal pain, blood in stool, constipation, diarrhea, nausea and vomiting.  Endocrine: Negative.   Genitourinary: Negative.  Negative for difficulty urinating, dysuria, frequency and hematuria.   Musculoskeletal:  Negative for arthralgias, back pain, flank pain, gait problem and myalgias.       Pain of the right shoulder  Skin: Negative.   Neurological:  Positive for extremity weakness (of the right side, chronic since prior stroke). Negative for dizziness, gait problem, headaches, light-headedness, numbness, seizures and speech difficulty.  Hematological: Negative.   Psychiatric/Behavioral: Negative.  Negative for depression and sleep disturbance. The patient is not nervous/anxious.     VITALS:  Blood pressure (!) 158/70, pulse 62, temperature 98.5 F (36.9 C), temperature source Oral, resp. rate 18, height 5\' 7"  (1.702 m), weight 125 lb 6.4 oz (56.9 kg), SpO2 97 %.  Wt Readings from Last 3 Encounters:  04/25/21 125 lb 6.4 oz (56.9 kg)  04/18/21 127 lb 3.2 oz (57.7 kg)  04/08/21 123 lb (55.8 kg)    Body mass index is 19.64 kg/m.  Performance status (ECOG): 1 - Symptomatic but completely ambulatory  PHYSICAL EXAM:   Physical Exam Constitutional:      General: She is not in acute distress.    Appearance: Normal appearance. She is normal weight.  HENT:     Head: Normocephalic and atraumatic.  Eyes:     General: No scleral icterus.    Extraocular Movements: Extraocular movements intact.     Conjunctiva/sclera: Conjunctivae normal.     Pupils: Pupils are equal, round, and reactive to light.  Cardiovascular:     Rate and Rhythm: Normal rate and regular rhythm.     Pulses: Normal pulses.     Heart sounds: Normal heart sounds. No murmur heard.   No friction rub. No gallop.  Pulmonary:     Effort: Pulmonary effort is normal. No respiratory distress.     Breath sounds: Normal breath sounds.  Abdominal:     General: Bowel sounds are normal. There is no distension.     Palpations: Abdomen is soft. There is no hepatomegaly, splenomegaly or mass.     Tenderness: There is no abdominal tenderness.  Musculoskeletal:        General: Normal range of motion.     Cervical back: Normal range of motion and neck supple.     Right lower leg: No  edema.     Left lower leg: No edema.     Comments: Limited range of motion of the right shoulder with pain  Lymphadenopathy:     Cervical: No cervical adenopathy.  Skin:    General: Skin is warm and dry.  Neurological:     General: No focal deficit present.     Mental Status: She is alert and oriented to person, place, and time. Mental status is at baseline.     Motor: Weakness (mild right hemiparesis) present.  Psychiatric:        Mood and Affect: Mood normal.        Behavior: Behavior normal.        Thought Content: Thought content normal.        Judgment: Judgment normal.   LABS:   CBC Latest Ref Rng & Units 04/22/2021 04/18/2021 04/08/2021  WBC - 6.0 6.8 7.3  Hemoglobin 12.0 - 16.0 12.7 12.7 13.5  Hematocrit 36 - 46 39 38 41  Platelets 150 - 399 176 192 209   CMP Latest Ref Rng & Units 04/22/2021 04/18/2021 04/08/2021  Glucose 65 - 99 mg/dL - - -  BUN 4  - 21 14 15 12   Creatinine 0.5 - 1.1 0.9 0.9 0.8  Sodium 137 - 147 140 140 140  Potassium 3.4 - 5.3 4.1 4.1 3.9  Chloride 99 - 108 107 107 105  CO2 13 - 22 27(A) 25(A) 26(A)  Calcium 8.7 - 10.7 9.2 9.2 9.5  Total Protein 6.0 - 8.3 g/dL - - -  Total Bilirubin 0.3 - 1.2 mg/dL - - -  Alkaline Phos 25 - 125 78 76 73  AST 13 - 35 25 37(A) 32  ALT 7 - 35 16 16 18      Lab Results  Component Value Date   CEA1 1.2 02/04/2021   /  CEA  Date Value Ref Range Status  02/04/2021 1.2 0.0 - 4.7 ng/mL Final    Comment:    (NOTE)                             Nonsmokers          <3.9                             Smokers             <5.6 Roche [employer]ostics Electrochemiluminescence Immunoassay (ECLIA) Values obtained with different assay methods or kits cannot be used interchangeably.  Results cannot be interpreted as absolute evidence of the presence or absence of malignant disease. Performed At: [facility] [place], [place] [id number] Rush Farmer MD DU:[id number]     Lab Results  Component Value Date   TIBC 364 02/10/2021   FERRITIN 22 02/10/2021   IRONPCTSAT 17 02/10/2021   No results found for: LDH  STUDIES:  No results found.    HISTORY:   Allergies:  Allergies  Allergen Reactions  . Ergocalciferol Nausea And Vomiting    Current Medications: Current Outpatient Medications  Medication Sig Dispense Refill  . ALPRAZolam (XANAX) 0.5 MG tablet Take 1 tablet (0.5 mg total) by mouth 3 (three) times daily as needed for anxiety. 90 tablet 0  . atorvastatin (LIPITOR) 80 MG tablet Take 80 mg by mouth daily.    . brimonidine (ALPHAGAN) 0.2 % ophthalmic solution in the morning and at bedtime. (Patient not taking: Reported on  04/18/2021)    . brimonidine-timolol (COMBIGAN) 0.2-0.5 % ophthalmic solution Place 1 drop into the right eye 2 (two) times daily. (Patient not taking: Reported on 04/18/2021)    . dexamethasone (DECADRON) 4 MG tablet Take 4 mg by mouth  daily as needed.    Marland Kitchen glipiZIDE (GLUCOTROL XL) 5 MG 24 hr tablet Take 5 mg by mouth daily.    Marland Kitchen HUMULIN R 100 UNIT/ML injection     . JANUVIA 100 MG tablet Take 100 mg by mouth daily.    Marland Kitchen LORazepam (ATIVAN) 0.5 MG tablet Take 1 tablet (0.5 mg total) by mouth at bedtime. 30 tablet 0  . losartan (COZAAR) 25 MG tablet Take 25 mg by mouth daily.    . metoprolol tartrate (LOPRESSOR) 50 MG tablet Take 50 mg by mouth as needed.    . mupirocin cream (BACTROBAN) 2 % Apply topically.    Marland Kitchen ofloxacin (OCUFLOX) 0.3 % ophthalmic solution Place into the left eye. (Patient not taking: Reported on 04/18/2021)    . omeprazole (PRILOSEC) 40 MG capsule Take 40 mg by mouth every morning.    . ondansetron (ZOFRAN) 4 MG tablet Take 1 tablet (4 mg total) by mouth every 4 (four) hours as needed for nausea. 90 tablet 3  . oxyCODONE (OXY IR/ROXICODONE) 5 MG immediate release tablet Take 1 tablet (5 mg total) by mouth every 4 (four) hours as needed for severe pain. 30 tablet 0  . prednisoLONE acetate (PRED FORTE) 1 % ophthalmic suspension Place into the left eye.    . prochlorperazine (COMPAZINE) 10 MG tablet Take 1 tablet (10 mg total) by mouth every 6 (six) hours as needed for nausea or vomiting. 90 tablet 3  . rivaroxaban (XARELTO) 20 MG TABS tablet Take 20 mg by mouth daily with supper.     No current facility-administered medications for this visit.     I, Rita Ohara, am acting as scribe for Derwood Kaplan, MD  I have reviewed this report as typed by the medical scribe, and it is complete and accurate.

## 2021-04-20 ENCOUNTER — Encounter: Payer: Self-pay | Admitting: Oncology

## 2021-04-22 ENCOUNTER — Other Ambulatory Visit: Payer: Self-pay | Admitting: Hematology and Oncology

## 2021-04-22 ENCOUNTER — Inpatient Hospital Stay: Payer: Medicare Other

## 2021-04-22 DIAGNOSIS — C3431 Malignant neoplasm of lower lobe, right bronchus or lung: Secondary | ICD-10-CM

## 2021-04-22 DIAGNOSIS — Z5112 Encounter for antineoplastic immunotherapy: Secondary | ICD-10-CM | POA: Diagnosis not present

## 2021-04-22 LAB — COMPREHENSIVE METABOLIC PANEL
Albumin: 3.9 (ref 3.5–5.0)
Calcium: 9.2 (ref 8.7–10.7)

## 2021-04-22 LAB — BASIC METABOLIC PANEL
BUN: 14 (ref 4–21)
CO2: 27 — AB (ref 13–22)
Chloride: 107 (ref 99–108)
Creatinine: 0.9 (ref 0.5–1.1)
Glucose: 121
Potassium: 4.1 (ref 3.4–5.3)
Sodium: 140 (ref 137–147)

## 2021-04-22 LAB — HEPATIC FUNCTION PANEL
ALT: 16 (ref 7–35)
AST: 25 (ref 13–35)
Alkaline Phosphatase: 78 (ref 25–125)
Bilirubin, Total: 0.3

## 2021-04-22 LAB — CBC: RBC: 4.36 (ref 3.87–5.11)

## 2021-04-22 LAB — CBC AND DIFFERENTIAL
HCT: 39 (ref 36–46)
Hemoglobin: 12.7 (ref 12.0–16.0)
Neutrophils Absolute: 2.82
Platelets: 176 (ref 150–399)
WBC: 6

## 2021-04-22 LAB — TSH: TSH: 2.897 u[IU]/mL (ref 0.350–4.500)

## 2021-04-25 ENCOUNTER — Other Ambulatory Visit: Payer: Self-pay

## 2021-04-25 ENCOUNTER — Inpatient Hospital Stay (INDEPENDENT_AMBULATORY_CARE_PROVIDER_SITE_OTHER): Payer: Medicare Other | Admitting: Oncology

## 2021-04-25 ENCOUNTER — Encounter: Payer: Self-pay | Admitting: Oncology

## 2021-04-25 ENCOUNTER — Other Ambulatory Visit: Payer: Self-pay | Admitting: Oncology

## 2021-04-25 VITALS — BP 158/70 | HR 62 | Temp 98.5°F | Resp 18 | Ht 67.0 in | Wt 125.4 lb

## 2021-04-25 DIAGNOSIS — C3431 Malignant neoplasm of lower lobe, right bronchus or lung: Secondary | ICD-10-CM | POA: Diagnosis not present

## 2021-04-25 DIAGNOSIS — C7931 Secondary malignant neoplasm of brain: Secondary | ICD-10-CM

## 2021-04-25 DIAGNOSIS — F32A Depression, unspecified: Secondary | ICD-10-CM

## 2021-04-25 LAB — T4: T4, Total: 8.1 ug/dL (ref 4.5–12.0)

## 2021-04-25 MED ORDER — MIRTAZAPINE 7.5 MG PO TABS: 7.5000 mg | ORAL_TABLET | Freq: Every day | ORAL | 5 refills | Status: DC

## 2021-04-26 ENCOUNTER — Ambulatory Visit: Payer: Medicare Other

## 2021-04-28 ENCOUNTER — Encounter: Payer: Self-pay | Admitting: Oncology

## 2021-04-28 MED FILL — Pembrolizumab IV Soln 100 MG/4ML (25 MG/ML): INTRAVENOUS | Qty: 8 | Status: AC

## 2021-04-29 ENCOUNTER — Other Ambulatory Visit: Payer: Self-pay

## 2021-04-29 ENCOUNTER — Inpatient Hospital Stay: Payer: Medicare Other | Attending: Oncology

## 2021-04-29 VITALS — BP 165/71 | HR 67 | Temp 98.2°F | Resp 18 | Ht 67.0 in | Wt 129.0 lb

## 2021-04-29 DIAGNOSIS — Z79899 Other long term (current) drug therapy: Secondary | ICD-10-CM | POA: Insufficient documentation

## 2021-04-29 DIAGNOSIS — Z5112 Encounter for antineoplastic immunotherapy: Secondary | ICD-10-CM | POA: Insufficient documentation

## 2021-04-29 DIAGNOSIS — C3431 Malignant neoplasm of lower lobe, right bronchus or lung: Secondary | ICD-10-CM | POA: Insufficient documentation

## 2021-04-29 DIAGNOSIS — C7931 Secondary malignant neoplasm of brain: Secondary | ICD-10-CM | POA: Insufficient documentation

## 2021-04-29 MED ORDER — SODIUM CHLORIDE 0.9 % IV SOLN
Freq: Once | INTRAVENOUS | Status: AC
Start: 2021-04-29 — End: 2021-04-29

## 2021-04-29 MED ORDER — SODIUM CHLORIDE 0.9 % IV SOLN
200.0000 mg | Freq: Once | INTRAVENOUS | Status: AC
  Administered 2021-04-29: 200 mg via INTRAVENOUS
  Filled 2021-04-29: qty 8

## 2021-04-29 NOTE — Patient Instructions (Signed)
Pembrolizumab injection What is this medication? PEMBROLIZUMAB (pem broe liz ue mab) is a monoclonal antibody. It is used to treat certain types of cancer. This medicine may be used for other purposes; ask your health care provider or pharmacist if you have questions. COMMON BRAND NAME(S): Keytruda What should I tell my care team before I take this medication? They need to know if you have any of these conditions: autoimmune diseases like Crohn's disease, ulcerative colitis, or lupus have had or planning to have an allogeneic stem cell transplant (uses someone else's stem cells) history of organ transplant history of chest radiation nervous system problems like myasthenia gravis or Guillain-Barre syndrome an unusual or allergic reaction to pembrolizumab, other medicines, foods, dyes, or preservatives pregnant or trying to get pregnant breast-feeding How should I use this medication? This medicine is for infusion into a vein. It is given by a health care professional in a hospital or clinic setting. A special MedGuide will be given to you before each treatment. Be sure to read this information carefully each time. Talk to your pediatrician regarding the use of this medicine in children. While this drug may be prescribed for children as young as 6 months for selected conditions, precautions do apply. Overdosage: If you think you have taken too much of this medicine contact a poison control center or emergency room at once. NOTE: This medicine is only for you. Do not share this medicine with others. What if I miss a dose? It is important not to miss your dose. Call your doctor or health care professional if you are unable to keep an appointment. What may interact with this medication? Interactions have not been studied. This list may not describe all possible interactions. Give your health care provider a list of all the medicines, herbs, non-prescription drugs, or dietary supplements you use.  Also tell them if you smoke, drink alcohol, or use illegal drugs. Some items may interact with your medicine. What should I watch for while using this medication? Your condition will be monitored carefully while you are receiving this medicine. You may need blood work done while you are taking this medicine. Do not become pregnant while taking this medicine or for 4 months after stopping it. Women should inform their doctor if they wish to become pregnant or think they might be pregnant. There is a potential for serious side effects to an unborn child. Talk to your health care professional or pharmacist for more information. Do not breast-feed an infant while taking this medicine or for 4 months after the last dose. What side effects may I notice from receiving this medication? Side effects that you should report to your doctor or health care professional as soon as possible: allergic reactions like skin rash, itching or hives, swelling of the face, lips, or tongue bloody or black, tarry breathing problems changes in vision chest pain chills confusion constipation cough diarrhea dizziness or feeling faint or lightheaded fast or irregular heartbeat fever flushing joint pain low blood counts - this medicine may decrease the number of white blood cells, red blood cells and platelets. You may be at increased risk for infections and bleeding. muscle pain muscle weakness pain, tingling, numbness in the hands or feet persistent headache redness, blistering, peeling or loosening of the skin, including inside the mouth signs and symptoms of high blood sugar such as dizziness; dry mouth; dry skin; fruity breath; nausea; stomach pain; increased hunger or thirst; increased urination signs and symptoms of kidney injury like trouble  passing urine or change in the amount of urine signs and symptoms of liver injury like dark urine, light-colored stools, loss of appetite, nausea, right upper belly pain,  yellowing of the eyes or skin sweating swollen lymph nodes weight loss Side effects that usually do not require medical attention (report to your doctor or health care professional if they continue or are bothersome): decreased appetite hair loss tiredness This list may not describe all possible side effects. Call your doctor for medical advice about side effects. You may report side effects to FDA at 1-800-FDA-1088. Where should I keep my medication? This drug is given in a hospital or clinic and will not be stored at home. NOTE: This sheet is a summary. It may not cover all possible information. If you have questions about this medicine, talk to your doctor, pharmacist, or health care provider.  2022 Elsevier/Gold Standard (2021-03-01 00:00:00)

## 2021-05-02 ENCOUNTER — Telehealth: Payer: Self-pay

## 2021-05-02 NOTE — Telephone Encounter (Signed)
-----   Message from Derwood Kaplan, MD sent at 05/02/2021  1:33 PM EST ----- Regarding: referral I called daughter and explained extensive degen changes of shoulder on MRI and partial rotator cuff tears, bone spurs, bursitis.  I rec referral to Ortho, will give you the report, then scan it in

## 2021-05-02 NOTE — Telephone Encounter (Signed)
Faxed referral to [facility] and Sports Medicine.

## 2021-05-03 ENCOUNTER — Encounter: Payer: Self-pay | Admitting: Oncology

## 2021-05-18 ENCOUNTER — Other Ambulatory Visit: Payer: Self-pay | Admitting: Oncology

## 2021-05-18 ENCOUNTER — Encounter: Payer: Self-pay | Admitting: Hematology and Oncology

## 2021-05-18 DIAGNOSIS — R634 Abnormal weight loss: Secondary | ICD-10-CM

## 2021-05-18 DIAGNOSIS — F32A Depression, unspecified: Secondary | ICD-10-CM

## 2021-05-18 HISTORY — DX: Abnormal weight loss: R63.4

## 2021-05-18 NOTE — Assessment & Plan Note (Signed)
Brain metastases diagnosed in April 2022. This presented with right sided head pain and pain of the right eye. She received stereotactic radiation to the two brain lesions.  MRI brain inSeptember revealed four subcentimeter metastatic deposits in the brain. Several show mild associated hemorrhage. Mild edema. No mass-effect or midline shift. We will continue to monitor these.

## 2021-05-18 NOTE — Assessment & Plan Note (Addendum)
She had severe right shoulder pain and x-ray revealed degenerative changes. She ws oxycodone 5 mg every 4 hours as needed due to the severity. She advised her against taking more than 2 Tylenol 3 times a day. MRI shoulder on November 3rd revealed moderate rotator cuff tendinopathy/tendinosis mainly involving the infraspinatus supraspinatus junction region posteriorly. There were interstitial tears and shallow articular surface tears. No full-thickness retracted tear. Intact long head biceps tendon and glenoid labrum. Moderate lateral downsloping and mild subacromial spurring from a type 1 acromion was seen. There was moderate thickening of the capsular structures in the axillary recess likely reflecting adhesive capsulitis. Synovitis was also possible. Mild subacromial/subdeltoid bursitis was seen. She was referred to orthopedics and received a steroid injection with improvement in her pain.  She is undergoing physical therapy.

## 2021-05-18 NOTE — Telephone Encounter (Signed)
Ok for 90 day supply.  

## 2021-05-18 NOTE — Assessment & Plan Note (Signed)
Stage IV lung cancer diagnosed in April 2022. She received a single cycle of chemotherapy with cisplatin/paclitaxel/pembrolizumab in June, and tolerated this very poorly. She was hospitalizedtwice. She is now receiving palliative pembrolizumab, which she is tolerating well. She will proceed with a 3rd cycle tomorrow. We will plan to see her back in 3 weeks prior to a 4th cycle.

## 2021-05-18 NOTE — Progress Notes (Signed)
[facility]  [address] [place],  [place]  [address] [phone]  Clinic Day:  05/24/2021  Referring physician: Townsend Roger, MD  ASSESSMENT & PLAN:   Assessment & Plan: Lung cancer, lower lobe ([place]) Stage IV lung cancer diagnosed in April 2022. She received a single cycle of chemotherapy with cisplatin/paclitaxel/pembrolizumab in June, and tolerated this very poorly. She was hospitalized twice. She is now receiving palliative pembrolizumab, which she is tolerating well. She will proceed with a 3rd cycle tomorrow. We will plan to see her back in 3 weeks prior to a 4th cycle.   Brain metastases [facility]) Brain metastases diagnosed in April 2022.  This presented with right sided head pain and pain of the right eye.  She received stereotactic radiation to the two brain lesions.  MRI brain in September revealed four subcentimeter metastatic deposits in the brain. Several show mild associated hemorrhage. Mild edema. No mass-effect or midline shift. We will continue to monitor these.  Shoulder pain, right She had severe right shoulder pain and x-ray revealed degenerative changes. She ws oxycodone 5 mg every 4 hours as needed due to the severity. She advised her against taking more than 2 Tylenol 3 times a day. MRI shoulder on November 3rd revealed moderate rotator cuff tendinopathy/tendinosis mainly involving the infraspinatus supraspinatus junction region posteriorly. There were interstitial tears and shallow articular surface tears. No full-thickness retracted tear. Intact long head biceps tendon and glenoid labrum. Moderate lateral downsloping and mild subacromial spurring from a type 1 acromion was seen. There was moderate thickening of the capsular structures in the axillary recess likely reflecting adhesive capsulitis. Synovitis was also possible. Mild subacromial/subdeltoid bursitis was seen. She was referred to orthopedics and received a steroid injection  with improvement in her pain.  She is undergoing physical therapy.  Weight loss Due to unintentional weight loss, she was placed on mirtazapine 7.5 mg at bedtime, apparently, she is not taking this. She continues to slowly lose weight. I encouraged her to try the mirtazapine.   The patient understands the plans discussed today and is in agreement with them.  She knows to contact our office if she develops concerns prior to her next appointment.      Raven Pickles, PA-C  [facility] AT [facility] [address] [place] [address] Dept: [phone] Dept Fax: [phone]   No orders of the defined types were placed in this encounter.     CHIEF COMPLAINT:  CC: Stage IV lung cancer  Current Treatment: Palliative pembrolizumab every 3 weeks   HISTORY OF PRESENT ILLNESS:   Oncology History  Lung cancer, lower lobe ([place])  07/07/2020 Initial Diagnosis   Lung cancer, lower lobe ([place])   10/15/2020 Cancer Staging   Staging form: Lung, AJCC 8th Edition - Clinical stage from 10/15/2020: Stage IV (cT1b, cN0, cM1) - Signed by Derwood Kaplan, MD on 03/31/2021 Histopathologic type: Squamous cell carcinoma, NOS Stage prefix: Initial diagnosis Histologic grade (G): G3 Histologic grading system: 4 grade system Laterality: Right Tumor size (mm): 18 Sites of metastasis: Brain Lymph-vascular invasion (LVI): Presence of LVI unknown/indeterminate Diagnostic confirmation: Positive histology Specimen type: Core Needle Biopsy Staged by: Managing physician Type of lung cancer: Locally advanced or metastatic non-small cell lung cancer Stage used in treatment planning: Yes National guidelines used in treatment planning: Yes Type of national guideline used in treatment planning: NCCN    04/08/2021 -  Chemotherapy   Patient is on  Treatment Plan : LUNG NSCLC flat dose Pembrolizumab Q21D        INTERVAL HISTORY:  [name] is here today  for repeat clinical assessment prior to a 3rd cycle of pembrolizumab. She continues to tolerate this without significant difficulty.  She is not taking mirtazapine.   She denies fevers or chills. She denies pain. Her appetite is good. Her weight has decreased 3 pounds over last 3 weeks .  REVIEW OF SYSTEMS:  Review of Systems  Constitutional:  Positive for appetite change and fatigue. Negative for chills, fever and unexpected weight change.  HENT:   Negative for lump/mass, mouth sores and sore throat.   Eyes:  Positive for eye problems (vision poor in right eye, stable).  Respiratory:  Negative for cough and shortness of breath.   Cardiovascular:  Negative for chest pain and leg swelling.  Gastrointestinal:  Negative for abdominal pain, constipation, diarrhea, nausea and vomiting.  Genitourinary:  Negative for difficulty urinating, dysuria, frequency and hematuria.   Musculoskeletal:  Positive for arthralgias (right shoulder, improved) and gait problem (unsteady, stable). Negative for back pain and myalgias.  Skin:  Negative for rash.  Neurological:  Positive for gait problem (unsteady, stable). Negative for dizziness, extremity weakness, headaches, numbness, seizures and speech difficulty.  Hematological:  Negative for adenopathy. Does not bruise/bleed easily.  Psychiatric/Behavioral:  Positive for sleep disturbance. Negative for depression. The patient is not nervous/anxious.     VITALS:  Blood pressure (!) 162/69, pulse 83, temperature 98.2 F (36.8 C), temperature source Oral, resp. rate 20, height 5\' 7"  (1.702 m), weight 123 lb 9.6 oz (56.1 kg), SpO2 95 %.  Wt Readings from Last 3 Encounters:  05/24/21 126 lb 12 oz (57.5 kg)  05/23/21 123 lb 9.6 oz (56.1 kg)  04/29/21 129 lb (58.5 kg)    Body mass index is 19.36 kg/m.  Performance status (ECOG): 1 - Symptomatic but completely ambulatory  PHYSICAL EXAM:  Physical Exam Vitals and nursing note reviewed.  Constitutional:       General: She is not in acute distress.    Appearance: Normal appearance.  HENT:     Head: Normocephalic and atraumatic.     Mouth/Throat:     Mouth: Mucous membranes are moist.     Pharynx: Oropharynx is clear. No oropharyngeal exudate or posterior oropharyngeal erythema.  Eyes:     General: No scleral icterus.    Extraocular Movements: Extraocular movements intact.     Conjunctiva/sclera: Conjunctivae normal.     Pupils: Pupils are equal, round, and reactive to light.  Cardiovascular:     Rate and Rhythm: Normal rate and regular rhythm.     Heart sounds: Normal heart sounds. No murmur heard.   No friction rub. No gallop.  Pulmonary:     Effort: Pulmonary effort is normal.     Breath sounds: Normal breath sounds. No wheezing, rhonchi or rales.  Abdominal:     General: There is no distension.     Palpations: Abdomen is soft. There is no hepatomegaly, splenomegaly or mass.     Tenderness: There is no abdominal tenderness.  Musculoskeletal:        General: Normal range of motion.     Cervical back: Normal range of motion and neck supple. No tenderness.     Right lower leg: No edema.     Left lower leg: No edema.  Lymphadenopathy:     Cervical: No cervical adenopathy.     Upper Body:     Right upper body:  No supraclavicular or axillary adenopathy.     Left upper body: No supraclavicular or axillary adenopathy.     Lower Body: No right inguinal adenopathy. No left inguinal adenopathy.  Skin:    General: Skin is warm and dry.     Coloration: Skin is not jaundiced.     Findings: No rash.  Neurological:     Mental Status: She is alert and oriented to person, place, and time.     Cranial Nerves: No cranial nerve deficit.     Sensory: Sensation is intact.     Motor: Motor function is intact.     Gait: Gait abnormal (unsteady, stable).  Psychiatric:        Mood and Affect: Mood normal.        Behavior: Behavior normal.        Thought Content: Thought content normal.    LABS:    CBC Latest Ref Rng & Units 05/23/2021 04/22/2021 04/18/2021  WBC - 8.4 6.0 6.8  Hemoglobin 12.0 - 16.0 12.4 12.7 12.7  Hematocrit 36 - 46 38 39 38  Platelets 150 - 399 146(A) 176 192   CMP Latest Ref Rng & Units 05/23/2021 04/22/2021 04/18/2021  Glucose 65 - 99 mg/dL - - -  BUN 4 - 21 20 14 15   Creatinine 0.5 - 1.1 1.0 0.9 0.9  Sodium 137 - 147 143 140 140  Potassium 3.4 - 5.3 3.7 4.1 4.1  Chloride 99 - 108 113(A) 107 107  CO2 13 - 22 23(A) 27(A) 25(A)  Calcium 8.7 - 10.7 8.7 9.2 9.2  Total Protein 6.0 - 8.3 g/dL - - -  Total Bilirubin 0.3 - 1.2 mg/dL - - -  Alkaline Phos 25 - 125 72 78 76  AST 13 - 35 20 25 37(A)  ALT 7 - 35 19 16 16      Lab Results  Component Value Date   CEA1 1.2 02/04/2021   /  CEA  Date Value Ref Range Status  02/04/2021 1.2 0.0 - 4.7 ng/mL Final    Comment:    (NOTE)                             Nonsmokers          <3.9                             Smokers             <5.6 Roche [employer] Electrochemiluminescence Immunoassay (ECLIA) Values obtained with different assay methods or kits cannot be used interchangeably.  Results cannot be interpreted as absolute evidence of the presence or absence of malignant disease. Performed At: [facility] [place], [place] [id number] Rush Farmer MD DQ:[id number]    No results found for: PSA1 No results found for: CAN199 No results found for: CAN125  No results found for: TOTALPROTELP, ALBUMINELP, A1GS, A2GS, BETS, BETA2SER, GAMS, MSPIKE, SPEI Lab Results  Component Value Date   TIBC 364 02/10/2021   FERRITIN 22 02/10/2021   IRONPCTSAT 17 02/10/2021   No results found for: LDH  STUDIES:  No results found.    HISTORY:   Past Medical History:  Diagnosis Date   Arthritis    Asthma    CAD (coronary artery disease) 09/09/2010   S/p multiple PCIs to RCA, LAD, LCx // s/p inf STEMI 4/18 >> POBA to mRCA followed by CABG (  L-LAD, S-D1, S-OM, S-PDA) // Intraoperative TEE  4/18: EF 50-55, no RWMA   Constipation 04/18/2021   Diabetes mellitus    Diabetic nephropathy (HCC)    stage II   Diabetic neuropathy (HCC)    GERD (gastroesophageal reflux disease)    History of MI (myocardial infarction) 05/2002   Hyperlipidemia 09/09/2010       Hypertension    Lung cancer ([place])    with brain metastases   Persistent atrial fibrillation ([place]) 10/01/2016   Post op AFib after CABG 4/18 // Amiodarone and Coumadin started // Amiodarone stopped 11/2016   Shoulder pain, right 04/18/2021   Stroke ([employer])    Weight loss 05/18/2021    Past Surgical History:  Procedure Laterality Date   CHOLECYSTECTOMY     CORONARY ARTERY BYPASS GRAFT N/A 10/06/2016   Procedure: CORONARY ARTERY BYPASS GRAFTING (CABG) x four, using internal mammary, and bilateral saphenous veins harvested endoscopically;  Surgeon: Melrose Nakayama, MD;  Location: [facility];  Service: Open Heart Surgery;  Laterality: N/A;   CORONARY BALLOON ANGIOPLASTY N/A 10/01/2016   Procedure: Coronary Balloon Angioplasty;  Surgeon: Belva Crome, MD;  Location: [facility];  Service: Cardiovascular;  Laterality: N/A;   LEFT HEART CATH AND CORONARY ANGIOGRAPHY N/A 10/01/2016   Procedure: Left Heart Cath and Coronary Angiography;  Surgeon: Belva Crome, MD;  Location: [facility];  Service: Cardiovascular;  Laterality: N/A;   TEE WITHOUT CARDIOVERSION N/A 10/06/2016   Procedure: TRANSESOPHAGEAL ECHOCARDIOGRAM (TEE);  Surgeon: Melrose Nakayama, MD;  Location: [facility];  Service: Open Heart Surgery;  Laterality: N/A;    Family History  Problem Relation Age of Onset   Tuberculosis Mother    Cancer Father 40       unknown type   Leukemia Sister    Tuberculosis Sister    Cancer Sister        liver   Cancer Brother 24   Cancer Brother    Heart attack Brother 61   Cancer Brother     Social History:  reports that she has been smoking cigarettes. She has a 60.00 pack-year smoking history. She has never used  smokeless tobacco. She reports that she does not drink alcohol and does not use drugs.The patient is accompanied by her daughter today.  Allergies:  Allergies  Allergen Reactions   Ergocalciferol Nausea And Vomiting    Current Medications: Current Outpatient Medications  Medication Sig Dispense Refill   ALPRAZolam (XANAX) 0.5 MG tablet Take 1 tablet (0.5 mg total) by mouth 3 (three) times daily as needed for anxiety. 90 tablet 0   atorvastatin (LIPITOR) 80 MG tablet Take 80 mg by mouth daily.     brimonidine (ALPHAGAN) 0.2 % ophthalmic solution in the morning and at bedtime. (Patient not taking: Reported on 04/18/2021)     brimonidine-timolol (COMBIGAN) 0.2-0.5 % ophthalmic solution Place 1 drop into the right eye 2 (two) times daily. (Patient not taking: Reported on 04/18/2021)     dexamethasone (DECADRON) 4 MG tablet Take 4 mg by mouth daily as needed.     glipiZIDE (GLUCOTROL XL) 5 MG 24 hr tablet Take 5 mg by mouth daily.     HUMULIN R 100 UNIT/ML injection      JANUVIA 100 MG tablet Take 100 mg by mouth daily.     LORazepam (ATIVAN) 0.5 MG tablet Take 1 tablet (0.5 mg total) by mouth at bedtime. 30 tablet 0   losartan (COZAAR) 25 MG tablet Take 25 mg by  mouth daily.     metoprolol tartrate (LOPRESSOR) 50 MG tablet Take 50 mg by mouth as needed.     mirtazapine (REMERON) 7.5 MG tablet TAKE 1 TABLET BY MOUTH AT BEDTIME. 90 tablet 2   mupirocin cream (BACTROBAN) 2 % Apply topically.     ofloxacin (OCUFLOX) 0.3 % ophthalmic solution Place into the left eye. (Patient not taking: Reported on 04/18/2021)     omeprazole (PRILOSEC) 40 MG capsule Take 40 mg by mouth every morning.     ondansetron (ZOFRAN) 4 MG tablet Take 1 tablet (4 mg total) by mouth every 4 (four) hours as needed for nausea. 90 tablet 3   oxyCODONE (OXY IR/ROXICODONE) 5 MG immediate release tablet Take 1 tablet (5 mg total) by mouth every 4 (four) hours as needed for severe pain. 30 tablet 0   prednisoLONE acetate (PRED  FORTE) 1 % ophthalmic suspension Place into the left eye.     prochlorperazine (COMPAZINE) 10 MG tablet Take 1 tablet (10 mg total) by mouth every 6 (six) hours as needed for nausea or vomiting. 90 tablet 3   rivaroxaban (XARELTO) 20 MG TABS tablet Take 20 mg by mouth daily with supper.     No current facility-administered medications for this visit.   Facility-Administered Medications Ordered in Other Visits  Medication Dose Route Frequency Provider Last Rate Last Admin   sodium chloride flush (NS) 0.9 % injection 10 mL  10 mL Intracatheter PRN Derwood Kaplan, MD   10 mL at 05/24/21 1009

## 2021-05-18 NOTE — Assessment & Plan Note (Addendum)
Due to unintentional weight loss, she was placed on mirtazapine 7.5 mg at bedtime, apparently, she is not taking this. She continues to slowly lose weight. I encouraged her to try the mirtazapine.

## 2021-05-23 ENCOUNTER — Inpatient Hospital Stay: Payer: Medicare Other

## 2021-05-23 ENCOUNTER — Inpatient Hospital Stay (INDEPENDENT_AMBULATORY_CARE_PROVIDER_SITE_OTHER): Payer: Medicare Other | Admitting: Hematology and Oncology

## 2021-05-23 ENCOUNTER — Other Ambulatory Visit: Payer: Self-pay

## 2021-05-23 ENCOUNTER — Other Ambulatory Visit: Payer: Self-pay | Admitting: Hematology and Oncology

## 2021-05-23 ENCOUNTER — Encounter: Payer: Self-pay | Admitting: Hematology and Oncology

## 2021-05-23 DIAGNOSIS — C3431 Malignant neoplasm of lower lobe, right bronchus or lung: Secondary | ICD-10-CM

## 2021-05-23 DIAGNOSIS — C7931 Secondary malignant neoplasm of brain: Secondary | ICD-10-CM

## 2021-05-23 DIAGNOSIS — M25511 Pain in right shoulder: Secondary | ICD-10-CM

## 2021-05-23 DIAGNOSIS — R634 Abnormal weight loss: Secondary | ICD-10-CM

## 2021-05-23 DIAGNOSIS — Z5112 Encounter for antineoplastic immunotherapy: Secondary | ICD-10-CM | POA: Diagnosis not present

## 2021-05-23 LAB — HEPATIC FUNCTION PANEL
ALT: 19 (ref 7–35)
AST: 20 (ref 13–35)
Alkaline Phosphatase: 72 (ref 25–125)
Bilirubin, Total: 0.5

## 2021-05-23 LAB — TSH: TSH: 1.347 u[IU]/mL (ref 0.350–4.500)

## 2021-05-23 LAB — CBC AND DIFFERENTIAL
HCT: 38 (ref 36–46)
Hemoglobin: 12.4 (ref 12.0–16.0)
Neutrophils Absolute: 5.21
Platelets: 146 — AB (ref 150–399)
WBC: 8.4

## 2021-05-23 LAB — BASIC METABOLIC PANEL
BUN: 20 (ref 4–21)
CO2: 23 — AB (ref 13–22)
Chloride: 113 — AB (ref 99–108)
Creatinine: 1 (ref 0.5–1.1)
Glucose: 142
Potassium: 3.7 (ref 3.4–5.3)
Sodium: 143 (ref 137–147)

## 2021-05-23 LAB — CBC: RBC: 4.2 (ref 3.87–5.11)

## 2021-05-23 LAB — COMPREHENSIVE METABOLIC PANEL
Albumin: 3.6 (ref 3.5–5.0)
Calcium: 8.7 (ref 8.7–10.7)

## 2021-05-23 MED ORDER — OXYCODONE HCL 5 MG PO TABS: 5.0000 mg | ORAL_TABLET | ORAL | 0 refills | Status: DC | PRN

## 2021-05-24 ENCOUNTER — Telehealth: Payer: Self-pay | Admitting: Hematology and Oncology

## 2021-05-24 ENCOUNTER — Encounter: Payer: Self-pay | Admitting: Oncology

## 2021-05-24 ENCOUNTER — Inpatient Hospital Stay: Payer: Medicare Other

## 2021-05-24 VITALS — BP 142/57 | HR 66 | Temp 98.1°F | Resp 18 | Ht 67.0 in | Wt 126.8 lb

## 2021-05-24 DIAGNOSIS — C3431 Malignant neoplasm of lower lobe, right bronchus or lung: Secondary | ICD-10-CM

## 2021-05-24 DIAGNOSIS — Z5112 Encounter for antineoplastic immunotherapy: Secondary | ICD-10-CM | POA: Diagnosis not present

## 2021-05-24 MED ORDER — SODIUM CHLORIDE 0.9 % IV SOLN
200.0000 mg | Freq: Once | INTRAVENOUS | Status: AC
  Administered 2021-05-24: 200 mg via INTRAVENOUS
  Filled 2021-05-24: qty 8

## 2021-05-24 MED ORDER — SODIUM CHLORIDE 0.9 % IV SOLN: Freq: Once | INTRAVENOUS | Status: AC

## 2021-05-24 MED ORDER — SODIUM CHLORIDE 0.9% FLUSH
10.0000 mL | INTRAVENOUS | Status: DC | PRN
  Administered 2021-05-24: 10 mL

## 2021-05-24 NOTE — Patient Instructions (Signed)
[facility]  Discharge Instructions: Thank you for choosing [facility] to provide your oncology and hematology care.  If you have a lab appointment with the [facility], please go directly to the [facility] and check in at the registration area.   Wear comfortable clothing and clothing appropriate for easy access to any Portacath or PICC line.   We strive to give you quality time with your provider. You may need to reschedule your appointment if you arrive late (15 or more minutes).  Arriving late affects you and other patients whose appointments are after yours.  Also, if you miss three or more appointments without notifying the office, you may be dismissed from the clinic at the provider's discretion.      For prescription refill requests, have your pharmacy contact our office and allow 72 hours for refills to be completed.    Today you received the following chemotherapy and/or immunotherapy agents PEMBROLIZUMAB      To help prevent nausea and vomiting after your treatment, we encourage you to take your nausea medication as directed.  BELOW ARE SYMPTOMS THAT SHOULD BE REPORTED IMMEDIATELY: *FEVER GREATER THAN 100.4 F (38 C) OR HIGHER *CHILLS OR SWEATING *NAUSEA AND VOMITING THAT IS NOT CONTROLLED WITH YOUR NAUSEA MEDICATION *UNUSUAL SHORTNESS OF BREATH *UNUSUAL BRUISING OR BLEEDING *URINARY PROBLEMS (pain or burning when urinating, or frequent urination) *BOWEL PROBLEMS (unusual diarrhea, constipation, pain near the anus) TENDERNESS IN MOUTH AND THROAT WITH OR WITHOUT PRESENCE OF ULCERS (sore throat, sores in mouth, or a toothache) UNUSUAL RASH, SWELLING OR PAIN  UNUSUAL VAGINAL DISCHARGE OR ITCHING   Items with * indicate a potential emergency and should be followed up as soon as possible or go to the Emergency Department if any problems should occur.  Please show the CHEMOTHERAPY ALERT CARD or IMMUNOTHERAPY ALERT CARD at check-in to the  Emergency Department and triage nurse.  Should you have questions after your visit or need to cancel or reschedule your appointment, please contact [facility]  Dept: [phone]  and follow the prompts.  Office hours are 8:00 a.m. to 4:30 p.m. Monday - Friday. Please note that voicemails left after 4:00 p.m. may not be returned until the following business day.  We are closed weekends and major holidays. You have access to a nurse at all times for urgent questions. Please call the main number to the clinic Dept: [phone] and follow the prompts.  For any non-urgent questions, you may also contact your provider using MyChart. We now offer e-Visits for anyone 66 and older to request care online for non-urgent symptoms. For details visit [email or web address].   Also download the MyChart app! Go to the app store, search "MyChart", open the app, select [facility], and log in with your MyChart username and password.  Due to Covid, a mask is required upon entering the hospital/clinic. If you do not have a mask, one will be given to you upon arrival. For doctor visits, patients may have 1 support person aged 43 or older with them. For treatment visits, patients cannot have anyone with them due to current Covid guidelines and our immunocompromised population.

## 2021-05-24 NOTE — Telephone Encounter (Signed)
Per 11/28 los next appt scheduled and given to patient

## 2021-05-30 ENCOUNTER — Encounter: Payer: Self-pay | Admitting: Oncology

## 2021-05-31 ENCOUNTER — Encounter: Payer: Self-pay | Admitting: Oncology

## 2021-06-01 ENCOUNTER — Other Ambulatory Visit: Payer: Self-pay | Admitting: Hematology and Oncology

## 2021-06-01 LAB — T4: Thyroxine (T4): 8.4

## 2021-06-02 LAB — T4: T4, Total: 8.4 ug/dL (ref 4.5–12.0)

## 2021-06-08 ENCOUNTER — Other Ambulatory Visit: Payer: Self-pay | Admitting: Pharmacist

## 2021-06-10 ENCOUNTER — Encounter: Payer: Self-pay | Admitting: Hematology and Oncology

## 2021-06-10 ENCOUNTER — Inpatient Hospital Stay: Payer: Medicare Other

## 2021-06-10 ENCOUNTER — Inpatient Hospital Stay: Payer: Medicare Other | Attending: Oncology | Admitting: Hematology and Oncology

## 2021-06-10 VITALS — BP 160/67 | HR 64 | Temp 98.4°F | Resp 18 | Ht 67.0 in | Wt 127.8 lb

## 2021-06-10 DIAGNOSIS — C7931 Secondary malignant neoplasm of brain: Secondary | ICD-10-CM | POA: Insufficient documentation

## 2021-06-10 DIAGNOSIS — C3431 Malignant neoplasm of lower lobe, right bronchus or lung: Secondary | ICD-10-CM | POA: Insufficient documentation

## 2021-06-10 DIAGNOSIS — C3490 Malignant neoplasm of unspecified part of unspecified bronchus or lung: Secondary | ICD-10-CM | POA: Diagnosis not present

## 2021-06-10 DIAGNOSIS — Z79899 Other long term (current) drug therapy: Secondary | ICD-10-CM | POA: Diagnosis not present

## 2021-06-10 DIAGNOSIS — Z5112 Encounter for antineoplastic immunotherapy: Secondary | ICD-10-CM | POA: Diagnosis not present

## 2021-06-10 LAB — CBC
MCV: 89 (ref 81–99)
RBC: 4.18 (ref 3.87–5.11)

## 2021-06-10 LAB — TSH: TSH: 2.012 u[IU]/mL (ref 0.350–4.500)

## 2021-06-10 LAB — BASIC METABOLIC PANEL
BUN: 15 (ref 4–21)
CO2: 22 (ref 13–22)
Chloride: 108 (ref 99–108)
Creatinine: 0.8 (ref 0.5–1.1)
Glucose: 120
Potassium: 4.2 (ref 3.4–5.3)
Sodium: 139 (ref 137–147)

## 2021-06-10 LAB — CBC AND DIFFERENTIAL
HCT: 37 (ref 36–46)
Hemoglobin: 12.6 (ref 12.0–16.0)
Neutrophils Absolute: 4.72
Platelets: 195 (ref 150–399)
WBC: 8.9

## 2021-06-10 LAB — HEPATIC FUNCTION PANEL
ALT: 21 (ref 7–35)
AST: 34 (ref 13–35)
Alkaline Phosphatase: 68 (ref 25–125)
Bilirubin, Total: 0.6

## 2021-06-10 LAB — COMPREHENSIVE METABOLIC PANEL
Albumin: 4.1 (ref 3.5–5.0)
Calcium: 9.3 (ref 8.7–10.7)

## 2021-06-10 MED FILL — Pembrolizumab IV Soln 100 MG/4ML (25 MG/ML): INTRAVENOUS | Qty: 8 | Status: AC

## 2021-06-10 NOTE — Assessment & Plan Note (Signed)
Stage IV lung cancer with brain metastases diagnosed in April 2022. She received a single cycle of chemotherapy with cisplatin/paclitaxel/pembrolizumab in June, and tolerated this very poorly. She was hospitalizedtwice. She is now receiving palliative pembrolizumab, which she is tolerating well. She will proceed with a 4th cycle on December 19th. We will plan to see her back in 3 weeks with a CBC, comprehensive metabolic panel, TSH, T4, CT chest and MRI brain to reassess her disease baseline prior to a 5th cycle.

## 2021-06-10 NOTE — Assessment & Plan Note (Signed)
Brain metastases diagnosed in April 2022. This presented with right sided head pain and pain of the right eye. She received stereotactic radiation to the two brain lesions.  MRI brain inSeptember revealed four subcentimeter metastatic deposits in the brain. Several show mild associated hemorrhage. Mild edema. No mass-effect or midline shift.  We will plan to repeat MRI brain prior to her next appointment.

## 2021-06-10 NOTE — Progress Notes (Signed)
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 She received a single cycle of chemotherapy with cisplatin/paclitaxel/pembrolizumab in June, and tolerated this very poorly. She was hospitalized twice. She is now receiving palliative pembrolizumab, which she is tolerating well. She will proceed with a 4th cycle on December 19th. We will plan to see her back in 3 weeks with a CBC, comprehensive metabolic panel, TSH, T4, CT chest and MRI brain to reassess her disease baseline prior to a 5th cycle.   Brain metastases [facility]) Brain metastases diagnosed in April 2022.  This presented with right sided head pain and pain of the right eye.  She received stereotactic radiation to the two brain lesions.  MRI brain in September revealed four subcentimeter metastatic deposits in the brain. Several show mild associated hemorrhage. Mild edema. No mass-effect or midline shift.  We will plan to repeat MRI brain prior to her next appointment.   The patient understands the plans discussed today and is in agreement with them.  She knows to contact our office if she develops concerns prior to her next appointment.      [name], PA-C  [facility] AT [facility] [address] [place] [address] Dept: [phone] Dept Fax: [phone]   Orders Placed This Encounter  Procedures   CT Chest W Contrast    Standing Status:   Future    Standing Expiration Date:   06/10/2022    Order Specific Question:   If indicated for the ordered procedure, I authorize the administration of contrast media per Radiology protocol    Answer:   Yes    Order Specific Question:   Preferred imaging location?     Answer:   External   MR Brain W Wo Contrast    Standing Status:   Future    Standing Expiration Date:   06/10/2022    Order Specific Question:   If indicated for the ordered procedure, I authorize the administration of contrast media per Radiology protocol    Answer:   Yes    Order Specific Question:   What is the patient's sedation requirement?    Answer:   No Sedation    Order Specific Question:   Does the patient have a pacemaker or implanted devices?    Answer:   No    Order Specific Question:   Use SRS Protocol?    Answer:   No    Order Specific Question:   Preferred imaging location?    Answer:   External   CBC and differential    This external order was created through the Results Console.   CBC    This external order was created through the Results Console.   Basic metabolic panel    This external order was created through the Results Console.   Comprehensive metabolic panel    This external order was created through the Results Console.   Hepatic function panel    This external order was created through the Results Console.   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  Current Treatment:  Palliative pembrolizumab   HISTORY OF PRESENT ILLNESS:   Oncology History  Lung cancer, lower lobe ([place])  07/07/2020 Initial Diagnosis   Lung cancer, lower lobe ([place])   10/15/2020 Cancer Staging   Staging form: Lung, AJCC 8th Edition - Clinical stage from 10/15/2020: Stage IV (cT1b, cN0, cM1) - Signed by Derwood Kaplan, MD on 03/31/2021 Histopathologic type: Squamous cell carcinoma, NOS Stage prefix: Initial diagnosis Histologic grade (G): G3 Histologic grading system: 4 grade system Laterality: Right Tumor size (mm): 18 Sites of metastasis: Brain Lymph-vascular invasion (LVI): Presence of LVI unknown/indeterminate Diagnostic confirmation: Positive histology Specimen  type: Core Needle Biopsy Staged by: Managing physician Type of lung cancer: Locally advanced or metastatic non-small cell lung cancer Stage used in treatment planning: Yes National guidelines used in treatment planning: Yes Type of national guideline used in treatment planning: NCCN    04/08/2021 -  Chemotherapy   Patient is on Treatment Plan : LUNG NSCLC flat dose Pembrolizumab Q21D        INTERVAL HISTORY:  Anora is here today for repeat clinical assessment prior to her 4th cycle of pembrolizumab.  She continues to tolerate this without significant difficulty.  She has mild fatigue.  She reports chronic clear nasal drainage, likely due to perennial allergies.  She reports intermittent cough, especially in the mornings, which is stable.  She continues to smoke about half a pack per day.  She reports occasional shortness of breath, which is stable.  She denies other adverse effects such as itching, rash or diarrhea.  She denies fevers or chills. She states her right shoulder pain improved with the injection.  She continues physical therapy  for her right shoulder. Her appetite is fairly good. Her weight has increased 1 pounds over last 3 weeks .  REVIEW OF SYSTEMS:  Review of Systems  Constitutional:  Negative for appetite change, chills, fatigue, fever and unexpected weight change.  HENT:   Negative for lump/mass, mouth sores and sore throat.   Respiratory:  Negative for cough and shortness of breath.   Cardiovascular:  Negative for chest pain and leg swelling.  Gastrointestinal:  Negative for abdominal pain, constipation, diarrhea, nausea and vomiting.  Endocrine: Negative for hot flashes.  Genitourinary:  Negative for difficulty urinating, dysuria, frequency and hematuria.   Musculoskeletal:  Negative for arthralgias, back pain and myalgias.  Skin:  Negative for rash.  Neurological:  Negative for dizziness and headaches.  Hematological:  Negative for adenopathy. Does not bruise/bleed  easily.  Psychiatric/Behavioral:  Negative for depression and sleep disturbance. The patient is not nervous/anxious.     VITALS:  Blood pressure (!) 160/67, pulse 64, temperature 98.4 F (36.9 C), temperature source Oral, resp. rate 18, height 5\' 7"  (1.702 m), weight 127 lb 12.8 oz (58 kg), SpO2 98 %.  Wt Readings from Last 3 Encounters:  06/10/21 127 lb 12.8 oz (58 kg)  05/24/21 126 lb 12 oz (57.5 kg)  05/23/21 123 lb 9.6 oz (56.1 kg)    Body mass index is 20.02 kg/m.  Performance status (ECOG): 1 - Symptomatic but completely ambulatory  PHYSICAL EXAM:  Physical Exam Vitals and nursing note reviewed.  Constitutional:      General: She is not in acute distress.    Appearance: Normal appearance.  HENT:     Head: Normocephalic and atraumatic.     Mouth/Throat:     Mouth: Mucous membranes are moist.     Pharynx: Oropharynx is clear. No oropharyngeal exudate or posterior oropharyngeal erythema.  Eyes:  General: No scleral icterus.    Extraocular Movements: Extraocular movements intact.     Conjunctiva/sclera: Conjunctivae normal.     Pupils: Pupils are equal, round, and reactive to light.  Cardiovascular:     Rate and Rhythm: Normal rate and regular rhythm.     Heart sounds: Normal heart sounds. No murmur heard.   No friction rub. No gallop.  Pulmonary:     Effort: Pulmonary effort is normal.     Breath sounds: Normal breath sounds. No wheezing, rhonchi or rales.  Abdominal:     General: There is no distension.     Palpations: Abdomen is soft. There is no hepatomegaly, splenomegaly or mass.     Tenderness: There is no abdominal tenderness.  Musculoskeletal:        General: Normal range of motion.     Cervical back: Normal range of motion and neck supple. No tenderness.     Right lower leg: No edema.     Left lower leg: No edema.  Lymphadenopathy:     Cervical: No cervical adenopathy.     Upper Body:     Right upper body: No supraclavicular or axillary adenopathy.      Left upper body: No supraclavicular or axillary adenopathy.     Lower Body: No right inguinal adenopathy. No left inguinal adenopathy.  Skin:    General: Skin is warm and dry.     Coloration: Skin is not jaundiced.     Findings: No rash.  Neurological:     Mental Status: She is alert and oriented to person, place, and time.     Cranial Nerves: No cranial nerve deficit.  Psychiatric:        Mood and Affect: Mood normal.        Behavior: Behavior normal.        Thought Content: Thought content normal.    LABS:   CBC Latest Ref Rng & Units 06/10/2021 05/23/2021 04/22/2021  WBC - 8.9 8.4 6.0  Hemoglobin 12.0 - 16.0 12.6 12.4 12.7  Hematocrit 36 - 46 37 38 39  Platelets 150 - 399 195 146(A) 176   CMP Latest Ref Rng & Units 06/10/2021 05/23/2021 04/22/2021  Glucose 65 - 99 mg/dL - - -  BUN 4 - 21 15 20 14   Creatinine 0.5 - 1.1 0.8 1.0 0.9  Sodium 137 - 147 139 143 140  Potassium 3.4 - 5.3 4.2 3.7 4.1  Chloride 99 - 108 108 113(A) 107  CO2 13 - 22 22 23(A) 27(A)  Calcium 8.7 - 10.7 9.3 8.7 9.2  Total Protein 6.0 - 8.3 g/dL - - -  Total Bilirubin 0.3 - 1.2 mg/dL - - -  Alkaline Phos 25 - 125 68 72 78  AST 13 - 35 34 20 25  ALT 7 - 35 21 19 16      Lab Results  Component Value Date   CEA1 1.2 02/04/2021   /  CEA  Date Value Ref Range Status  02/04/2021 1.2 0.0 - 4.7 ng/mL Final    Comment:    (NOTE)                             Nonsmokers          <3.9                             Smokers             <  5.6 [employer] Electrochemiluminescence Immunoassay (ECLIA) Values obtained with different assay methods or kits cannot be used interchangeably.  Results cannot be interpreted as absolute evidence of the presence or absence of malignant disease. Performed At: [facility] [place], [place] [id number] Rush Farmer MD WU:[id number]    No results found for: PSA1 No results found for: CAN199 No results found for: CAN125  No results found  for: TOTALPROTELP, ALBUMINELP, A1GS, A2GS, BETS, BETA2SER, GAMS, MSPIKE, SPEI Lab Results  Component Value Date   TIBC 364 02/10/2021   FERRITIN 22 02/10/2021   IRONPCTSAT 17 02/10/2021   No results found for: LDH  STUDIES:  No results found.    HISTORY:   Past Medical History:  Diagnosis Date   Arthritis    Asthma    CAD (coronary artery disease) 09/09/2010   S/p multiple PCIs to RCA, LAD, LCx // s/p inf STEMI 4/18 >> POBA to mRCA followed by CABG (L-LAD, S-D1, S-OM, S-PDA) // Intraoperative TEE 4/18: EF 50-55, no RWMA   Constipation 04/18/2021   Diabetes mellitus    Diabetic nephropathy (HCC)    stage II   Diabetic neuropathy (HCC)    GERD (gastroesophageal reflux disease)    History of MI (myocardial infarction) 05/2002   Hyperlipidemia 09/09/2010       Hypertension    Lung cancer ([place])    with brain metastases   Persistent atrial fibrillation ([place]) 10/01/2016   Post op AFib after CABG 4/18 // Amiodarone and Coumadin started // Amiodarone stopped 11/2016   Shoulder pain, right 04/18/2021   Stroke ([place])    Weight loss 05/18/2021    Past Surgical History:  Procedure Laterality Date   CHOLECYSTECTOMY     CORONARY ARTERY BYPASS GRAFT N/A 10/06/2016   Procedure: CORONARY ARTERY BYPASS GRAFTING (CABG) x four, using internal mammary, and bilateral saphenous veins harvested endoscopically;  Surgeon: Melrose Nakayama, MD;  Location: [facility];  Service: Open Heart Surgery;  Laterality: N/A;   CORONARY BALLOON ANGIOPLASTY N/A 10/01/2016   Procedure: Coronary Balloon Angioplasty;  Surgeon: Belva Crome, MD;  Location: [facility];  Service: Cardiovascular;  Laterality: N/A;   LEFT HEART CATH AND CORONARY ANGIOGRAPHY N/A 10/01/2016   Procedure: Left Heart Cath and Coronary Angiography;  Surgeon: Belva Crome, MD;  Location: [facility];  Service: Cardiovascular;  Laterality: N/A;   TEE WITHOUT CARDIOVERSION N/A 10/06/2016   Procedure: TRANSESOPHAGEAL ECHOCARDIOGRAM  (TEE);  Surgeon: Melrose Nakayama, MD;  Location: [facility];  Service: Open Heart Surgery;  Laterality: N/A;    Family History  Problem Relation Age of Onset   Tuberculosis Mother    Cancer Father 26       unknown type   Leukemia Sister    Tuberculosis Sister    Cancer Sister        liver   Cancer Brother 26   Cancer Brother    Heart attack Brother 79   Cancer Brother     Social History:  reports that she has been smoking cigarettes. She has a 60.00 pack-year smoking history. She has never used smokeless tobacco. She reports that she does not drink alcohol and does not use drugs.The patient is alone today.  Allergies:  Allergies  Allergen Reactions   Ergocalciferol Nausea And Vomiting    Current Medications: Current Outpatient Medications  Medication Sig Dispense Refill   ALPRAZolam (XANAX) 0.5 MG tablet Take 1 tablet (0.5 mg total) by mouth 3 (three) times daily as  needed for anxiety. 90 tablet 0   atorvastatin (LIPITOR) 80 MG tablet Take 80 mg by mouth daily.     brimonidine (ALPHAGAN) 0.2 % ophthalmic solution in the morning and at bedtime. (Patient not taking: Reported on 04/18/2021)     brimonidine-timolol (COMBIGAN) 0.2-0.5 % ophthalmic solution Place 1 drop into the right eye 2 (two) times daily. (Patient not taking: Reported on 04/18/2021)     dexamethasone (DECADRON) 4 MG tablet Take 4 mg by mouth daily as needed.     gabapentin (NEURONTIN) 100 MG capsule Take by mouth.     glipiZIDE (GLUCOTROL XL) 5 MG 24 hr tablet Take 5 mg by mouth daily.     HUMULIN R 100 UNIT/ML injection      JANUVIA 100 MG tablet Take 100 mg by mouth daily.     LORazepam (ATIVAN) 0.5 MG tablet Take 1 tablet (0.5 mg total) by mouth at bedtime. 30 tablet 0   losartan (COZAAR) 25 MG tablet Take 25 mg by mouth daily.     metoprolol tartrate (LOPRESSOR) 50 MG tablet Take 50 mg by mouth as needed.     mirtazapine (REMERON) 7.5 MG tablet TAKE 1 TABLET BY MOUTH AT BEDTIME. 90 tablet 2   mupirocin  cream (BACTROBAN) 2 % Apply topically.     ofloxacin (OCUFLOX) 0.3 % ophthalmic solution Place into the left eye. (Patient not taking: Reported on 04/18/2021)     omeprazole (PRILOSEC) 40 MG capsule Take 40 mg by mouth every morning.     ondansetron (ZOFRAN) 4 MG tablet Take 1 tablet (4 mg total) by mouth every 4 (four) hours as needed for nausea. 90 tablet 3   oxyCODONE (OXY IR/ROXICODONE) 5 MG immediate release tablet Take 1 tablet (5 mg total) by mouth every 4 (four) hours as needed for severe pain. 30 tablet 0   prednisoLONE acetate (PRED FORTE) 1 % ophthalmic suspension Place into the left eye.     prochlorperazine (COMPAZINE) 10 MG tablet Take 1 tablet (10 mg total) by mouth every 6 (six) hours as needed for nausea or vomiting. 90 tablet 3   rivaroxaban (XARELTO) 20 MG TABS tablet Take 20 mg by mouth daily with supper.     No current facility-administered medications for this visit.

## 2021-06-11 ENCOUNTER — Other Ambulatory Visit: Payer: Self-pay

## 2021-06-11 LAB — T4: T4, Total: 8 ug/dL (ref 4.5–12.0)

## 2021-06-13 ENCOUNTER — Other Ambulatory Visit: Payer: Self-pay

## 2021-06-13 ENCOUNTER — Inpatient Hospital Stay: Payer: Medicare Other

## 2021-06-13 VITALS — BP 120/59 | HR 56 | Temp 98.7°F | Resp 18 | Ht 67.0 in | Wt 128.0 lb

## 2021-06-13 DIAGNOSIS — Z5112 Encounter for antineoplastic immunotherapy: Secondary | ICD-10-CM | POA: Diagnosis not present

## 2021-06-13 DIAGNOSIS — C3431 Malignant neoplasm of lower lobe, right bronchus or lung: Secondary | ICD-10-CM

## 2021-06-13 MED ORDER — HEPARIN SOD (PORK) LOCK FLUSH 100 UNIT/ML IV SOLN: 500.0000 [IU] | Freq: Once | INTRAVENOUS | Status: DC | PRN

## 2021-06-13 MED ORDER — SODIUM CHLORIDE 0.9 % IV SOLN: Freq: Once | INTRAVENOUS | Status: AC

## 2021-06-13 MED ORDER — SODIUM CHLORIDE 0.9 % IV SOLN
200.0000 mg | Freq: Once | INTRAVENOUS | Status: AC
  Administered 2021-06-13: 200 mg via INTRAVENOUS
  Filled 2021-06-13: qty 8

## 2021-06-13 MED ORDER — SODIUM CHLORIDE 0.9% FLUSH: 10.0000 mL | INTRAVENOUS | Status: DC | PRN

## 2021-06-13 NOTE — Patient Instructions (Signed)
[facility]  Discharge Instructions: Thank you for choosing [facility] to provide your oncology and hematology care.  If you have a lab appointment with the [facility], please go directly to the [facility] and check in at the registration area.   Wear comfortable clothing and clothing appropriate for easy access to any Portacath or PICC line.   We strive to give you quality time with your provider. You may need to reschedule your appointment if you arrive late (15 or more minutes).  Arriving late affects you and other patients whose appointments are after yours.  Also, if you miss three or more appointments without notifying the office, you may be dismissed from the clinic at the providers discretion.      For prescription refill requests, have your pharmacy contact our office and allow 72 hours for refills to be completed.    Today you received the following chemotherapy and/or immunotherapy agents Pembrolizumab     To help prevent nausea and vomiting after your treatment, we encourage you to take your nausea medication as directed.  BELOW ARE SYMPTOMS THAT SHOULD BE REPORTED IMMEDIATELY: *FEVER GREATER THAN 100.4 F (38 C) OR HIGHER *CHILLS OR SWEATING *NAUSEA AND VOMITING THAT IS NOT CONTROLLED WITH YOUR NAUSEA MEDICATION *UNUSUAL SHORTNESS OF BREATH *UNUSUAL BRUISING OR BLEEDING *URINARY PROBLEMS (pain or burning when urinating, or frequent urination) *BOWEL PROBLEMS (unusual diarrhea, constipation, pain near the anus) TENDERNESS IN MOUTH AND THROAT WITH OR WITHOUT PRESENCE OF ULCERS (sore throat, sores in mouth, or a toothache) UNUSUAL RASH, SWELLING OR PAIN  UNUSUAL VAGINAL DISCHARGE OR ITCHING   Items with * indicate a potential emergency and should be followed up as soon as possible or go to the Emergency Department if any problems should occur.  Please show the CHEMOTHERAPY ALERT CARD or IMMUNOTHERAPY ALERT CARD at check-in to the  Emergency Department and triage nurse.  Should you have questions after your visit or need to cancel or reschedule your appointment, please contact [facility]  Dept: [phone]  and follow the prompts.  Office hours are 8:00 a.m. to 4:30 p.m. Monday - Friday. Please note that voicemails left after 4:00 p.m. may not be returned until the following business day.  We are closed weekends and major holidays. You have access to a nurse at all times for urgent questions. Please call the main number to the clinic Dept: [phone] and follow the prompts.  For any non-urgent questions, you may also contact your provider using MyChart. We now offer e-Visits for anyone 78 and older to request care online for non-urgent symptoms. For details visit [email or web address].   Also download the MyChart app! Go to the app store, search "MyChart", open the app, select [facility], and log in with your MyChart username and password.  Due to Covid, a mask is required upon entering the hospital/clinic. If you do not have a mask, one will be given to you upon arrival. For doctor visits, patients may have 1 support person aged 90 or older with them. For treatment visits, patients cannot have anyone with them due to current Covid guidelines and our immunocompromised population.

## 2021-06-13 NOTE — Progress Notes (Signed)
1115 PT STABLE AT TIME OF DISCHARGE. 

## 2021-06-14 ENCOUNTER — Encounter: Payer: Self-pay | Admitting: Oncology

## 2021-06-17 ENCOUNTER — Telehealth: Payer: Self-pay | Admitting: Hematology and Oncology

## 2021-06-17 NOTE — Telephone Encounter (Signed)
06/17/21 left msg upcoming ct scans and mri scans

## 2021-06-24 NOTE — Progress Notes (Signed)
[name]  [address] [place],  [place]  [address] [phone]  Clinic Day:  07/01/2021  Referring physician: Townsend Roger, MD  This document serves as a record of services personally performed by Raven Poisson, MD. It was created on their behalf by [name], a trained medical scribe. The creation of this record is based on the scribe's personal observations and the provider's statements to them.  ASSESSMENT & PLAN:   Assessment & Plan:  Lung cancer, lower lobe ([place]), diagnosed in April 2022 She received 1 dose of chemotherapy in June 2022, with combination cisplatin/Taxol/pembrolizumab, and tolerated this very poorly and ended up in the hospital twice. She is now receiving palliative pembrolizumab since October, and tolerating well. CT chest from January is stable to mildly improved.   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  This presented with right sided head pain and pain of the right eye.  She received stereotactic radiation to the two brain lesions. MRI brain from September 26th revealed four metastatic deposits in the brain. Several show mild associated hemorrhage. Mild edema. No mass-effect or midline shift. We will continue to monitor these. MRI brain from January revealed a mixed response with one new enhancing lesion in the right cerebellar hemisphere measuring 3 mm. One lesion in the left frontal lobe has resolved, one lesion in the right occipital lobe is stable, and two lesions are mildly decreased in size.  History of stroke, October 2020 She has residual right sided weakness and numbness of the right upper extremity.  Significant cardiac disease with atrial fibrillation and history of coronary artery bypass She is on anticoagulation.  Constipation I recommended using senna-S 2 tabs at least at bedtime and twice daily if needed to keep her bowels regular.  Shoulder pain, right Worsening right  shoulder pain. 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 Right shoulder x-ray revealed diffusely decreased mineralization of the bones. No acute fracture or dislocation. Hypertrophic degenerative changes present at the acromioclavicular joint. Mild degenerative changes at the glenohumeral joint. Sternotomy wires are noted over the midline. The soft tissues are within normal limits. She has been placed on oxycodone 5 mg every 4 hours as needed. I advised her against taking more than 2 Tylenol 3 times a day.   Recent CT and MRI imaging shows that her current regimen is keeping her disease under control. She will be going out of town for the next few weeks and so we will postpone her 5th cycle of pembrolizumab that was scheduled for next week. She says she is going to see her son, who has also been diagnosed with cancer. We will plan to see her back in 3 weeks with a CBC and comprehensive metabolic panel prior to a 5th cycle of pembrolizumab. The patient understands the plans discussed today and is in agreement with them.  She knows to contact our office if she develops concerns prior to her next appointment.   I provided 15 minutes of face-to-face time during this this encounter and > 50% was spent counseling as documented under my assessment and plan.    [facility] [address] [place] [address] Dept: [phone] Dept Fax: [phone]   No orders of the defined types were placed in this encounter.     CHIEF COMPLAINT:  CC: Metastatic lung cancer to brain  Current Treatment:  Palliative pembrolizumab  HISTORY OF PRESENT ILLNESS:  [name] is a 75 y.o. female referred by Dr. Vassie Loll [name] for a transfer of care and continued management of metastatic lung cancer to the brain.  She states that her lung cancer first manifested as trouble of the right eye and pain which radiated  to the right side of her head. MRI imaging in April 2022 revealed intrinsic hyperintense T1 signal lesions within the right cerebrum demonstrate postcontrast enhancement consistent with hemorrhagic metastases.  One lesion was in the right occipital lobe and one in the posterior right frontal lobe.  She did have evidence of vasogenic edema and was placed on dexamethasone.  She also had evidence of multiple lacunar infarctions of the basal ganglia.  PET in May confirmed a hypermetabolic nodule in the right lower lobe consistent with bronchogenic carcinoma.  No evidence of other metastatic disease was observed.  She received 1 treatment of chemotherapy (possibly cisplatin/paclitaxel/pembrolizumab). She also underwent stereotactic radiation to the two brain lesions, which was completed in early June.  The patient struggled with chemotherapy and radiation and had to be hospitalized twice due to her severe weakness.  She declined to pursue further chemotherapy after that experience.  She moved from [place] to [place] in July to live with her daughter.  We only have limited records from her oncologist in [place]. Review of her pathology revealed poorly differentiated carcinoma consistent with lung primary and favoring squamous cell histology. The results of her Guardant testing were tracked down, which was negative for any targetable mutations. She has slowly improved to the point we felt she could consider additional therapy.   CT chest from September 14th revealed a spiculated right lower lobe pulmonary nodule measuring 2.0 x 1.9 cm, initially 1.8 cm, which likely represents the site of known bronchogenic neoplasm. Also with findings suspicious for right infrahilar nodal involvement despite small size. Smaller nodular foci in the right lower lobe suspicious for multifocal involvement, of uncertain significance in the absence of priors. Small nodules also in the contralateral lung near the apex are  indeterminate. There is a 13 mm left adrenal nodule which is well-circumscribed and within 1-2 mm of its size in 2018 not present in 2011. Findings more likely reflect adrenal adenoma.  MRI brain from September 26th revealed four metastatic deposits in the brain, 7 mm or less. Several show mild associated hemorrhage and mild edema. No mass-effect or midline shift. These results were reviewed with the patient. Chronic lacunar infarctions of the left side were seen. She is on anticoagulation with rivaroxaban. She was placed on palliative pembrolizumb and had her 1st cycle on October 14th.   Oncology History  Lung cancer, lower lobe ([place])  07/07/2020 Initial Diagnosis   Lung cancer, lower lobe ([place])   10/15/2020 Cancer Staging   Staging form: Lung, AJCC 8th Edition - Clinical stage from 10/15/2020: Stage IV (cT1b, cN0, cM1) - Signed by Derwood Kaplan, MD on 03/31/2021 Histopathologic type: Squamous cell carcinoma, NOS Stage prefix: Initial diagnosis Histologic grade (G): G3 Histologic grading system: 4 grade system Laterality: Right Tumor size (mm): 18 Sites of metastasis: Brain Lymph-vascular invasion (LVI): Presence of LVI unknown/indeterminate Diagnostic confirmation: Positive histology Specimen type: Core Needle Biopsy Staged by: Managing physician Type of lung cancer: Locally advanced or metastatic non-small cell lung cancer Stage used in treatment planning: Yes National guidelines used in treatment planning: Yes Type of national guideline used in treatment planning: NCCN    04/08/2021 -  Chemotherapy   Patient  is on Treatment Plan : LUNG NSCLC flat dose Pembrolizumab Q21D        INTERVAL HISTORY:  Zakayla is here for routine follow up prior to a 5th cycle of pembrolizumab. CT chest from January 4th revealed stable to slight decrease in size of the dominant spiculated nodule within the right lower lobe, now measuring 2.0 x 1.6 cm, previously 2.0 x 1.9 cm. Additional small nodules  within the right lung are unchanged in the interval. No new or progressive disease identified. Stable left adrenal gland nodule. MRI brain revealed mixed treatment response noting one new enhancing lesion in the right cerebellar hemisphere measuring 3 mm. One lesion in the left frontal lobe has resolved, one lesion in the right occipital lobe is stable, and two lesions are mildly decreased in size. I reviewed the images with her and her family member. She states that she is tolerating treatment well, and plans to continue, but will be going out of town for 3-4 weeks to see  her son, who has also been diagnosed with cancer. She denies complaints today. Blood counts and chemistries are unremarkable except for a hemoglobin of 11.7, down from 12.6. Her  appetite is good, and she has gained 5 pounds since her last visit.  She denies fever, chills or other signs of infection.  She denies nausea, vomiting, bowel issues, or abdominal pain.  She denies sore throat, cough, dyspnea, or chest pain.  REVIEW OF SYSTEMS:  Review of Systems  Constitutional: Negative.  Negative for appetite change, chills, fatigue, fever and unexpected weight change.  HENT:  Negative.    Eyes: Negative.   Respiratory: Negative.  Negative for chest tightness, cough, hemoptysis, shortness of breath and wheezing.   Cardiovascular: Negative.  Negative for chest pain, leg swelling and palpitations.  Gastrointestinal: Negative.  Negative for abdominal distention, abdominal pain, blood in stool, constipation, diarrhea, nausea and vomiting.  Endocrine: Negative.   Genitourinary: Negative.  Negative for difficulty urinating, dysuria, frequency and hematuria.   Musculoskeletal:  Negative for arthralgias, back pain, flank pain, gait problem and myalgias.  Skin: Negative.   Neurological:  Positive for extremity weakness (of the right side, chronic since prior stroke). Negative for dizziness, gait problem, headaches, light-headedness, numbness,  seizures and speech difficulty.  Hematological: Negative.   Psychiatric/Behavioral: Negative.  Negative for depression and sleep disturbance. The patient is not nervous/anxious.     VITALS:  Blood pressure (!) 141/70, pulse 72, temperature 98 F (36.7 C), temperature source Oral, resp. rate 20, height 5\' 7"  (1.702 m), weight 133 lb (60.3 kg), SpO2 97 %.  Wt Readings from Last 3 Encounters:  07/01/21 133 lb (60.3 kg)  06/13/21 128 lb (58.1 kg)  06/10/21 127 lb 12.8 oz (58 kg)    Body mass index is 20.83 kg/m.  Performance status (ECOG): 1 - Symptomatic but completely ambulatory  PHYSICAL EXAM:  Physical Exam Constitutional:      General: She is not in acute distress.    Appearance: Normal appearance. She is normal weight.  HENT:     Head: Normocephalic and atraumatic.  Eyes:     General: No scleral icterus.    Extraocular Movements: Extraocular movements intact.     Conjunctiva/sclera: Conjunctivae normal.     Pupils: Pupils are equal, round, and reactive to light.  Cardiovascular:     Rate and Rhythm: Normal rate and regular rhythm.     Pulses: Normal pulses.     Heart sounds: Normal heart sounds. No murmur heard.  No friction rub. No gallop.  Pulmonary:     Effort: Pulmonary effort is normal. No respiratory distress.     Breath sounds: Normal breath sounds.  Abdominal:     General: Bowel sounds are normal. There is no distension.     Palpations: Abdomen is soft. There is no hepatomegaly, splenomegaly or mass.     Tenderness: There is no abdominal tenderness.  Musculoskeletal:        General: Normal range of motion.     Cervical back: Normal range of motion and neck supple.     Right lower leg: No edema.     Left lower leg: No edema.  Lymphadenopathy:     Cervical: No cervical adenopathy.  Skin:    General: Skin is warm and dry.  Neurological:     General: No focal deficit present.     Mental Status: She is alert and oriented to person, place, and time. Mental  status is at baseline.  Psychiatric:        Mood and Affect: Mood normal.        Behavior: Behavior normal.        Thought Content: Thought content normal.        Judgment: Judgment normal.   LABS:   CBC Latest Ref Rng & Units 07/01/2021 06/10/2021 05/23/2021  WBC - 7.4 8.9 8.4  Hemoglobin 12.0 - 16.0 11.7(A) 12.6 12.4  Hematocrit 36 - 46 35(A) 37 38  Platelets 150 - 399 196 195 146(A)   CMP Latest Ref Rng & Units 07/01/2021 06/10/2021 05/23/2021  Glucose 65 - 99 mg/dL - - -  BUN 4 - 21 12 15 20   Creatinine 0.5 - 1.1 0.8 0.8 1.0  Sodium 137 - 147 139 139 143  Potassium 3.4 - 5.3 4.3 4.2 3.7  Chloride 99 - 108 108 108 113(A)  CO2 13 - 22 25(A) 22 23(A)  Calcium 8.7 - 10.7 8.9 9.3 8.7  Total Protein 6.0 - 8.3 g/dL - - -  Total Bilirubin 0.3 - 1.2 mg/dL - - -  Alkaline Phos 25 - 125 66 68 72  AST 13 - 35 26 34 20  ALT 7 - 35 19 21 19      Lab Results  Component Value Date   CEA1 1.2 02/04/2021   /  CEA  Date Value Ref Range Status  02/04/2021 1.2 0.0 - 4.7 ng/mL Final    Comment:    (NOTE)                             Nonsmokers          <3.9                             Smokers             <5.6 [employer] Electrochemiluminescence Immunoassay (ECLIA) Values obtained with different assay methods or kits cannot be used interchangeably.  Results cannot be interpreted as absolute evidence of the presence or absence of malignant disease. Performed At: [facility] [place], [place] [id number] Rush Farmer MD [id number]     Lab Results  Component Value Date   TIBC 364 02/10/2021   FERRITIN 22 02/10/2021   IRONPCTSAT 17 02/10/2021   No results found for: LDH  STUDIES:  No results found.   EXAM: 06/29/2021 CT CHEST WITH CONTRAST   TECHNIQUE:  Multidetector  CT imaging of the chest was performed during  intravenous contrast administration.   CONTRAST: 75 cc Isovue 370   COMPARISON: 03/09/2021.   FINDINGS:  Cardiovascular:  Normal heart size. No pericardial effusion. Aortic  atherosclerosis. Previous median sternotomy and CABG procedure.  Mediastinum/Nodes: Hypodense nodule within the inferior pole of left  lobe of thyroid gland measures 1.7 cm, image 51/601. Recommend  thyroid US (ref: J Am Coll Radiol. 2015 Feb;12(2): 143-50). The  trachea appears patent and is midline. Normal appearance of the  esophagus. No enlarged supraclavicular, axillary, mediastinal or  hilar adenopathy.  Lungs/Pleura: No pleural effusion or airspace consolidation.  Respiratory motion artifact diminishes exam detail within the upper  and lower lung zones. Within this limitation, multiple lung nodules  are again noted predominantly in the right lower lobe, including:  -The spiculated nodule within the right lower lobe measures 2.0 by  1.6 cm, image 61/4. Formally 2.0 x 1.9 cm.  -6 mm nodule within the lateral right lower lobe, image 76/4.  Previously this measured the same.  -The adjacent nodule measures approximately 7 mm, image 76/4. Also  unchanged from previous exam.  -4 mm peripheral right lower lobe lung nodule is noted, image 76/4.  Unchanged from previous study. -posterior subpleural nodule within the periphery of the right lower  lobe measures 3 mm, image 78/4 and appears unchanged.  -5 mm nodule within the posteromedial right lower lobe is stable,  image 82/4.  -Lastly, there is a 6 8 mm subpleural nodule within the periphery of  the right lower lobe which is stable in the interval, image 70/4.  -Tiny nodule within the anterior left upper lobe is stable measuring  3 mm, image 46/4.  No signs of new or progressive disease identified.  Upper Abdomen: No acute abnormality within the imaged portions of  the upper abdomen. Status post cholecystectomy. Nonobstructing stone  within upper pole of right kidney measures 4 mm. Exophytic cyst  measuring 2.2 cm arises off the lateral cortex of the upper pole of  left kidney. Within  the left adrenal gland there is a 1.5 cm nodule,  image 108/2. This is unchanged when compared with the previous exam.  Musculoskeletal: No chest wall abnormality. No acute or significant  osseous findings.   IMPRESSION:  1. Exam detail is diminished due to respiratory motion artifact.  Within this limitation there is stable to slight decrease in size of  the dominant spiculated nodule within the right lower lobe.  Additional small nodules within the right lung are unchanged in the  interval. No new or progressive disease identified.  2. Stable left adrenal gland nodule.  3. Nonobstructing right renal calculus.  4. Hypodense nodule within the inferior pole of left lobe of thyroid  gland measures 1.7 cm. Recommend thyroid US (ref: J Am Coll Radiol.  2015 Feb;12(2): 143-50).  5. Aortic Atherosclerosis (ICD10-I70.0).   EXAM: 06/29/2021 MRI HEAD WITHOUT AND WITH CONTRAST   TECHNIQUE:  Multiplanar, multiecho pulse sequences of the brain and surrounding  structures were obtained without and with intravenous contrast.   COMPARISON: MRI of the brain March 21, 2021.   FINDINGS:  Brain: No acute infarction, hemorrhage, hydrocephalus or extra-axial  collection.  A new focus of contrast enhancement is seen in the inferior right  cerebellar hemisphere measuring approximately 3 mm (series 11, image  53. Other 3 enhancing lesions identified on prior study are again  seen on current study:  - 6 mm in the right occipital lobe (series 11, image  98), unchanged;  - 2 mm in the left frontal operculum with hemosiderin deposit  (series 11, image 131), 3 mm on prior;  -5 mm in the posterior right frontal lobe with hemosiderin deposit  (series 11, image 160), 6 mm prior.  Enhancing lesion in the left frontal high convexity described on  prior study is no longer seen.  Remote lacunar infarcts in the left basal ganglia region extending  to the corona radiata and internal capsule with wallerian   degeneration extending into the left cerebral peduncle, unchanged.  Scattered and confluent foci of T2 hyperintensity are seen within  the white matter of the cerebral hemispheres are nonspecific, most  likely related to chronic small vessel ischemia.  Vascular: Normal flow voids. Skull and upper cervical spine: Normal marrow signal.  Sinuses/Orbits: Left lens surgery. Paranasal sinuses are clear.  Other: None.   IMPRESSION:  Mixed treatment response noting a 1 new enhancing lesion in the  right cerebellar hemisphere. One lesion in the left frontal lobe has  resolved, 1 lesion in the right occipital lobe is stable and two  lesions are mildly decreased in size.   HISTORY:   Allergies:  Allergies  Allergen Reactions   Ergocalciferol Nausea And Vomiting    Current Medications: Current Outpatient Medications  Medication Sig Dispense Refill   ALPRAZolam (XANAX) 0.5 MG tablet Take 1 tablet (0.5 mg total) by mouth 3 (three) times daily as needed for anxiety. 90 tablet 0   atorvastatin (LIPITOR) 80 MG tablet Take 80 mg by mouth daily.     brimonidine (ALPHAGAN) 0.2 % ophthalmic solution in the morning and at bedtime. (Patient not taking: Reported on 04/18/2021)     brimonidine-timolol (COMBIGAN) 0.2-0.5 % ophthalmic solution Place 1 drop into the right eye 2 (two) times daily. (Patient not taking: Reported on 04/18/2021)     dexamethasone (DECADRON) 4 MG tablet Take 4 mg by mouth daily as needed.     gabapentin (NEURONTIN) 100 MG capsule Take by mouth.     glipiZIDE (GLUCOTROL XL) 5 MG 24 hr tablet Take 5 mg by mouth daily.     HUMULIN R 100 UNIT/ML injection      JANUVIA 100 MG tablet Take 100 mg by mouth daily.     LORazepam (ATIVAN) 0.5 MG tablet Take 1 tablet (0.5 mg total) by mouth at bedtime. 30 tablet 0   losartan (COZAAR) 25 MG tablet Take 25 mg by mouth daily.     metoprolol tartrate (LOPRESSOR) 50 MG tablet Take 50 mg by mouth as needed.     mirtazapine (REMERON) 7.5 MG  tablet TAKE 1 TABLET BY MOUTH AT BEDTIME. 90 tablet 2   mupirocin cream (BACTROBAN) 2 % Apply topically.     ofloxacin (OCUFLOX) 0.3 % ophthalmic solution Place into the left eye. (Patient not taking: Reported on 04/18/2021)     omeprazole (PRILOSEC) 40 MG capsule Take 40 mg by mouth every morning.     ondansetron (ZOFRAN) 4 MG tablet Take 1 tablet (4 mg total) by mouth every 4 (four) hours as needed for nausea. 90 tablet 3   oxyCODONE (OXY IR/ROXICODONE) 5 MG immediate release tablet Take 1 tablet (5 mg total) by mouth every 4 (four) hours as needed for severe pain. 30 tablet 0   prednisoLONE acetate (PRED FORTE) 1 % ophthalmic suspension Place into the left eye.     prochlorperazine (COMPAZINE) 10 MG tablet Take 1 tablet (10 mg total) by mouth every 6 (six) hours as needed for nausea or  vomiting. 90 tablet 3   rivaroxaban (XARELTO) 20 MG TABS tablet Take 20 mg by mouth daily with supper.     No current facility-administered medications for this visit.     I, Rita Ohara, am acting as scribe for Derwood Kaplan, MD  I have reviewed this report as typed by the medical scribe, and it is complete and accurate.

## 2021-06-26 ENCOUNTER — Encounter: Payer: Self-pay | Admitting: Oncology

## 2021-06-27 ENCOUNTER — Other Ambulatory Visit: Payer: Self-pay | Admitting: Oncology

## 2021-06-29 DIAGNOSIS — I700 Atherosclerosis of aorta: Secondary | ICD-10-CM | POA: Diagnosis not present

## 2021-06-29 DIAGNOSIS — G9389 Other specified disorders of brain: Secondary | ICD-10-CM | POA: Diagnosis not present

## 2021-06-29 DIAGNOSIS — C3431 Malignant neoplasm of lower lobe, right bronchus or lung: Secondary | ICD-10-CM | POA: Diagnosis not present

## 2021-06-29 DIAGNOSIS — R911 Solitary pulmonary nodule: Secondary | ICD-10-CM | POA: Diagnosis not present

## 2021-06-29 DIAGNOSIS — C3490 Malignant neoplasm of unspecified part of unspecified bronchus or lung: Secondary | ICD-10-CM | POA: Diagnosis not present

## 2021-06-29 DIAGNOSIS — C7931 Secondary malignant neoplasm of brain: Secondary | ICD-10-CM | POA: Diagnosis not present

## 2021-07-01 ENCOUNTER — Other Ambulatory Visit: Payer: Self-pay | Admitting: Oncology

## 2021-07-01 ENCOUNTER — Inpatient Hospital Stay: Payer: Medicare Other | Attending: Oncology | Admitting: Oncology

## 2021-07-01 ENCOUNTER — Encounter: Payer: Self-pay | Admitting: Oncology

## 2021-07-01 ENCOUNTER — Other Ambulatory Visit: Payer: Self-pay | Admitting: Hematology and Oncology

## 2021-07-01 ENCOUNTER — Inpatient Hospital Stay: Payer: Medicare Other

## 2021-07-01 VITALS — BP 141/70 | HR 72 | Temp 98.0°F | Resp 20 | Ht 67.0 in | Wt 133.0 lb

## 2021-07-01 DIAGNOSIS — C3431 Malignant neoplasm of lower lobe, right bronchus or lung: Secondary | ICD-10-CM | POA: Diagnosis not present

## 2021-07-01 DIAGNOSIS — Z79899 Other long term (current) drug therapy: Secondary | ICD-10-CM | POA: Insufficient documentation

## 2021-07-01 DIAGNOSIS — C7931 Secondary malignant neoplasm of brain: Secondary | ICD-10-CM | POA: Diagnosis not present

## 2021-07-01 DIAGNOSIS — C3490 Malignant neoplasm of unspecified part of unspecified bronchus or lung: Secondary | ICD-10-CM | POA: Diagnosis not present

## 2021-07-01 LAB — CBC AND DIFFERENTIAL
HCT: 35 — AB (ref 36–46)
Hemoglobin: 11.7 — AB (ref 12.0–16.0)
Neutrophils Absolute: 3.92
Platelets: 196 (ref 150–399)
WBC: 7.4

## 2021-07-01 LAB — HEPATIC FUNCTION PANEL
ALT: 19 (ref 7–35)
AST: 26 (ref 13–35)
Alkaline Phosphatase: 66 (ref 25–125)
Bilirubin, Total: 0.7

## 2021-07-01 LAB — BASIC METABOLIC PANEL
BUN: 12 (ref 4–21)
CO2: 25 — AB (ref 13–22)
Chloride: 108 (ref 99–108)
Creatinine: 0.8 (ref 0.5–1.1)
Glucose: 145
Potassium: 4.3 (ref 3.4–5.3)
Sodium: 139 (ref 137–147)

## 2021-07-01 LAB — CBC: RBC: 3.92 (ref 3.87–5.11)

## 2021-07-01 LAB — COMPREHENSIVE METABOLIC PANEL
Albumin: 3.9 (ref 3.5–5.0)
Calcium: 8.9 (ref 8.7–10.7)

## 2021-07-01 LAB — TSH: TSH: 1.558 u[IU]/mL (ref 0.350–4.500)

## 2021-07-01 MED FILL — Pembrolizumab IV Soln 100 MG/4ML (25 MG/ML): INTRAVENOUS | Qty: 8 | Status: AC

## 2021-07-04 ENCOUNTER — Inpatient Hospital Stay: Payer: Medicare Other

## 2021-07-04 LAB — T4: T4, Total: 8.2 ug/dL (ref 4.5–12.0)

## 2021-07-05 ENCOUNTER — Encounter: Payer: Self-pay | Admitting: Oncology

## 2021-07-06 ENCOUNTER — Encounter: Payer: Self-pay | Admitting: Oncology

## 2021-07-21 ENCOUNTER — Encounter: Payer: Self-pay | Admitting: Oncology

## 2021-07-22 NOTE — Progress Notes (Incomplete)
[facility]  [address] [place],  [place]  [address] [phone]  Clinic Day:  07/22/2021  Referring physician: Townsend Roger, MD  This document serves as a record of services personally performed by Hosie Poisson, MD. It was created on their behalf by [facility] E, a trained medical scribe. The creation of this record is based on the scribe's personal observations and the provider's statements to them.  ASSESSMENT & PLAN:   Assessment & Plan:  Lung cancer, lower lobe ([place]), diagnosed in April 2022 She received 1 dose of chemotherapy in June 2022, with combination cisplatin/Taxol/pembrolizumab, and tolerated this very poorly and ended up in the hospital twice. She is now receiving palliative pembrolizumab since October, and tolerating well. CT chest from January is stable to mildly improved.   Brain metastases [facility]) Brain metastases diagnosed in April 2022.  This presented with right sided head pain and pain of the right eye.  She received stereotactic radiation to the two brain lesions. MRI brain from September 26th revealed four metastatic deposits in the brain. Several show mild associated hemorrhage. Mild edema. No mass-effect or midline shift. We will continue to monitor these. MRI brain from January revealed a mixed response with one new enhancing lesion in the right cerebellar hemisphere measuring 3 mm. One lesion in the left frontal lobe has resolved, one lesion in the right occipital lobe is stable, and two lesions are mildly decreased in size.  History of stroke, October 2020 She has residual right sided weakness and numbness of the right upper extremity.  Significant cardiac disease with atrial fibrillation and history of coronary artery bypass She is on anticoagulation.  Constipation I recommended using senna-S 2 tabs at least at bedtime and twice daily if needed to keep her bowels regular.  Shoulder pain, right Worsening right  shoulder pain. 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 Right shoulder x-ray revealed diffusely decreased mineralization of the bones. No acute fracture or dislocation. Hypertrophic degenerative changes present at the acromioclavicular joint. Mild degenerative changes at the glenohumeral joint. Sternotomy wires are noted over the midline. The soft tissues are within normal limits. She has been placed on oxycodone 5 mg every 4 hours as needed. I advised her against taking more than 2 Tylenol 3 times a day.   She will proceed with a 5th cycle of pembrolizumab. We will plan to see her back in 3 weeks with a CBC and comprehensive metabolic panel prior to a 6th cycle of pembrolizumab. The patient understands the plans discussed today and is in agreement with them.  She knows to contact our office if she develops concerns prior to her next appointment.   I provided 15 minutes of face-to-face time during this this encounter and > 50% was spent counseling as documented under my assessment and plan.    [facility] [address] [place] [address] Dept: [phone] Dept Fax: [phone]   No orders of the defined types were placed in this encounter.     CHIEF COMPLAINT:  CC: Metastatic lung cancer to brain  Current Treatment:  Palliative pembrolizumab   HISTORY OF PRESENT ILLNESS:  [name] is a 76 y.o. female referred by Dr. 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  She states that her lung cancer first manifested as trouble of the right eye  and pain which radiated to the right side of her head. MRI imaging in April 2022 revealed intrinsic hyperintense T1 signal lesions within the right cerebrum demonstrate postcontrast enhancement consistent with hemorrhagic metastases.  One lesion was in the right occipital lobe and one  in the posterior right frontal lobe.  She did have evidence of vasogenic edema and was placed on dexamethasone.  She also had evidence of multiple lacunar infarctions of the basal ganglia.  PET in May confirmed a hypermetabolic nodule in the right lower lobe consistent with bronchogenic carcinoma.  No evidence of other metastatic disease was observed.  She received 1 treatment of chemotherapy (possibly cisplatin/paclitaxel/pembrolizumab). She also underwent stereotactic radiation to the two brain lesions, which was completed in early June.  The patient struggled with chemotherapy and radiation and had to be hospitalized twice due to her severe weakness.  She declined to pursue further chemotherapy after that experience.  She moved from [place] to [place] in July to live with her daughter.  We only have limited records from her oncologist in [place]. Review of her pathology revealed poorly differentiated carcinoma consistent with lung primary and favoring squamous cell histology. The results of her Guardant testing were tracked down, which was negative for any targetable mutations. She has slowly improved to the point we felt she could consider additional therapy.   CT chest from September 14th revealed a spiculated right lower lobe pulmonary nodule measuring 2.0 x 1.9 cm, initially 1.8 cm, which likely represents the site of known bronchogenic neoplasm. Also with findings suspicious for right infrahilar nodal involvement despite small size. Smaller nodular foci in the right lower lobe suspicious for multifocal involvement, of uncertain significance in the absence of priors. Small nodules also in the contralateral lung near the apex are indeterminate. There is a 13 mm left adrenal nodule which is well-circumscribed and within 1-2 mm of its size in 2018 not present in 2011. Findings more likely reflect adrenal adenoma.  MRI brain from September 26th revealed four metastatic deposits in the brain, 7 mm or  less. Several show mild associated hemorrhage and mild edema. No mass-effect or midline shift. These results were reviewed with the patient. Chronic lacunar infarctions of the left side were seen. She is on anticoagulation with rivaroxaban. She was placed on palliative pembrolizumb and had her 1st cycle on October 14th.  CT chest from January 2023 revealed stable to slight decrease in size of the dominant spiculated nodule within the right lower lobe, now measuring 2.0 x 1.6 cm, previously 2.0 x 1.9 cm. Additional small nodules within the right lung are unchanged in the interval. No new or progressive disease identified. Stable left adrenal gland nodule. MRI brain revealed mixed treatment response noting one new enhancing lesion in the right cerebellar hemisphere measuring 3 mm. One lesion in the left frontal lobe has resolved, one lesion in the right occipital lobe is stable, and two lesions are mildly decreased in size.    Oncology History  Lung cancer, lower lobe ([place])  07/07/2020 Initial Diagnosis   Lung cancer, lower lobe ([place])   10/15/2020 Cancer Staging   Staging form: Lung, AJCC 8th Edition - Clinical stage from 10/15/2020: Stage IV (cT1b, cN0, cM1) - Signed by Derwood Kaplan, MD on 03/31/2021 Histopathologic type: Squamous cell carcinoma, NOS Stage prefix: Initial diagnosis Histologic grade (G): G3 Histologic grading system: 4 grade system Laterality: Right Tumor size (mm): 18 Sites of metastasis: Brain Lymph-vascular invasion (LVI): Presence of LVI unknown/indeterminate Diagnostic confirmation: Positive histology  Specimen type: Core Needle Biopsy Staged by: Managing physician Type of lung cancer: Locally advanced or metastatic non-small cell lung cancer Stage used in treatment planning: Yes National guidelines used in treatment planning: Yes Type of national guideline used in treatment planning: NCCN   04/08/2021 -  Chemotherapy   Patient is on Treatment Plan : LUNG NSCLC  flat dose Pembrolizumab Q21D        INTERVAL HISTORY:  [name] is here for routine follow up prior to a 5th cycle of pembrolizumab. CT chest from January 4th revealed stable to slight decrease in size of the dominant spiculated nodule within the right lower lobe, now measuring 2.0 x 1.6 cm, previously 2.0 x 1.9 cm. Additional small nodules within the right lung are unchanged in the interval. No new or progressive disease identified. Stable left adrenal gland nodule. MRI brain revealed mixed treatment response noting one new enhancing lesion in the right cerebellar hemisphere measuring 3 mm. One lesion in the left frontal lobe has resolved, one lesion in the right occipital lobe is stable, and two lesions are mildly decreased in size. I reviewed the images with her and her family member. She states that she is tolerating treatment well, and plans to continue, but will be going out of town for 3-4 weeks to see  her son, who has also been diagnosed with cancer. She denies complaints today. Blood counts and chemistries are unremarkable except for a hemoglobin of 11.7, down from 12.6. Her  appetite is good, and she has gained 5 pounds since her last visit.  She denies fever, chills or other signs of infection.  She denies nausea, vomiting, bowel issues, or abdominal pain.  She denies sore throat, cough, dyspnea, or chest pain.  Dorethia is here for routine follow up prior to a 5th cycle of pembrolizumab. This was postponed as the patient had to go out of town.   Her  appetite is good, and she has gained/lost _ pounds since her last visit.  She denies fever, chills or other signs of infection.  She denies nausea, vomiting, bowel issues, or abdominal pain.  She denies sore throat, cough, dyspnea, or chest pain.  REVIEW OF SYSTEMS:  Review of Systems  Constitutional: Negative.  Negative for appetite change, chills, fatigue, fever and unexpected weight change.  HENT:  Negative.    Eyes: Negative.   Respiratory:  Negative.  Negative for chest tightness, cough, hemoptysis, shortness of breath and wheezing.   Cardiovascular: Negative.  Negative for chest pain, leg swelling and palpitations.  Gastrointestinal: Negative.  Negative for abdominal distention, abdominal pain, blood in stool, constipation, diarrhea, nausea and vomiting.  Endocrine: Negative.   Genitourinary: Negative.  Negative for difficulty urinating, dysuria, frequency and hematuria.   Musculoskeletal:  Negative for arthralgias, back pain, flank pain, gait problem and myalgias.  Skin: Negative.   Neurological:  Positive for extremity weakness (of the right side, chronic since prior stroke). Negative for dizziness, gait problem, headaches, light-headedness, numbness, seizures and speech difficulty.  Hematological: Negative.   Psychiatric/Behavioral: Negative.  Negative for depression and sleep disturbance. The patient is not nervous/anxious.     VITALS:  There were no vitals taken for this visit.  Wt Readings from Last 3 Encounters:  07/01/21 133 lb (60.3 kg)  06/13/21 128 lb (58.1 kg)  06/10/21 127 lb 12.8 oz (58 kg)    There is no height or weight on file to calculate BMI.  Performance status (ECOG): 1 - Symptomatic but completely ambulatory  PHYSICAL EXAM:  Physical Exam Constitutional:      General: She is not in acute distress.    Appearance: Normal appearance. She is normal weight.  HENT:     Head: Normocephalic and atraumatic.  Eyes:     General: No scleral icterus.    Extraocular Movements: Extraocular movements intact.     Conjunctiva/sclera: Conjunctivae normal.     Pupils: Pupils are equal, round, and reactive to light.  Cardiovascular:     Rate and Rhythm: Normal rate and regular rhythm.     Pulses: Normal pulses.     Heart sounds: Normal heart sounds. No murmur heard.   No friction rub. No gallop.  Pulmonary:     Effort: Pulmonary effort is normal. No respiratory distress.     Breath sounds: Normal breath  sounds.  Abdominal:     General: Bowel sounds are normal. There is no distension.     Palpations: Abdomen is soft. There is no hepatomegaly, splenomegaly or mass.     Tenderness: There is no abdominal tenderness.  Musculoskeletal:        General: Normal range of motion.     Cervical back: Normal range of motion and neck supple.     Right lower leg: No edema.     Left lower leg: No edema.  Lymphadenopathy:     Cervical: No cervical adenopathy.  Skin:    General: Skin is warm and dry.  Neurological:     General: No focal deficit present.     Mental Status: She is alert and oriented to person, place, and time. Mental status is at baseline.  Psychiatric:        Mood and Affect: Mood normal.        Behavior: Behavior normal.        Thought Content: Thought content normal.        Judgment: Judgment normal.   LABS:   CBC Latest Ref Rng & Units 07/01/2021 06/10/2021 05/23/2021  WBC - 7.4 8.9 8.4  Hemoglobin 12.0 - 16.0 11.7(A) 12.6 12.4  Hematocrit 36 - 46 35(A) 37 38  Platelets 150 - 399 196 195 146(A)   CMP Latest Ref Rng & Units 07/01/2021 06/10/2021 05/23/2021  Glucose 65 - 99 mg/dL - - -  BUN 4 - 21 12 15 20   Creatinine 0.5 - 1.1 0.8 0.8 1.0  Sodium 137 - 147 139 139 143  Potassium 3.4 - 5.3 4.3 4.2 3.7  Chloride 99 - 108 108 108 113(A)  CO2 13 - 22 25(A) 22 23(A)  Calcium 8.7 - 10.7 8.9 9.3 8.7  Total Protein 6.0 - 8.3 g/dL - - -  Total Bilirubin 0.3 - 1.2 mg/dL - - -  Alkaline Phos 25 - 125 66 68 72  AST 13 - 35 26 34 20  ALT 7 - 35 19 21 19      Lab Results  Component Value Date   CEA1 1.2 02/04/2021   /  CEA  Date Value Ref Range Status  02/04/2021 1.2 0.0 - 4.7 ng/mL Final    Comment:    (NOTE)                             Nonsmokers          <3.9                             Smokers             <  5.6 [employer] Electrochemiluminescence Immunoassay (ECLIA) Values obtained with different assay methods or kits cannot be used interchangeably.  Results  cannot be interpreted as absolute evidence of the presence or absence of malignant disease. Performed At: [facility] [place], [place] [id number] Rush Farmer MD IO:[id number]     Lab Results  Component Value Date   TIBC 364 02/10/2021   FERRITIN 22 02/10/2021   IRONPCTSAT 17 02/10/2021   No results found for: LDH  STUDIES:  No results found.    HISTORY:   Allergies:  Allergies  Allergen Reactions   Ergocalciferol Nausea And Vomiting    Current Medications: Current Outpatient Medications  Medication Sig Dispense Refill   ALPRAZolam (XANAX) 0.5 MG tablet Take 1 tablet (0.5 mg total) by mouth 3 (three) times daily as needed for anxiety. 90 tablet 0   atorvastatin (LIPITOR) 80 MG tablet Take 80 mg by mouth daily.     brimonidine (ALPHAGAN) 0.2 % ophthalmic solution in the morning and at bedtime. (Patient not taking: Reported on 04/18/2021)     brimonidine-timolol (COMBIGAN) 0.2-0.5 % ophthalmic solution Place 1 drop into the right eye 2 (two) times daily. (Patient not taking: Reported on 04/18/2021)     dexamethasone (DECADRON) 4 MG tablet Take 4 mg by mouth daily as needed.     gabapentin (NEURONTIN) 100 MG capsule Take by mouth.     glipiZIDE (GLUCOTROL XL) 5 MG 24 hr tablet Take 5 mg by mouth daily.     HUMULIN R 100 UNIT/ML injection      JANUVIA 100 MG tablet Take 100 mg by mouth daily.     LORazepam (ATIVAN) 0.5 MG tablet Take 1 tablet (0.5 mg total) by mouth at bedtime. 30 tablet 0   losartan (COZAAR) 25 MG tablet Take 25 mg by mouth daily.     metoprolol tartrate (LOPRESSOR) 50 MG tablet Take 50 mg by mouth as needed.     mirtazapine (REMERON) 7.5 MG tablet TAKE 1 TABLET BY MOUTH AT BEDTIME. 90 tablet 2   mupirocin cream (BACTROBAN) 2 % Apply topically.     ofloxacin (OCUFLOX) 0.3 % ophthalmic solution Place into the left eye. (Patient not taking: Reported on 04/18/2021)     omeprazole (PRILOSEC) 40 MG capsule Take 40 mg by mouth every  morning.     ondansetron (ZOFRAN) 4 MG tablet Take 1 tablet (4 mg total) by mouth every 4 (four) hours as needed for nausea. 90 tablet 3   oxyCODONE (OXY IR/ROXICODONE) 5 MG immediate release tablet Take 1 tablet (5 mg total) by mouth every 4 (four) hours as needed for severe pain. 30 tablet 0   prednisoLONE acetate (PRED FORTE) 1 % ophthalmic suspension Place into the left eye.     prochlorperazine (COMPAZINE) 10 MG tablet Take 1 tablet (10 mg total) by mouth every 6 (six) hours as needed for nausea or vomiting. 90 tablet 3   rivaroxaban (XARELTO) 20 MG TABS tablet Take 20 mg by mouth daily with supper.     No current facility-administered medications for this visit.     I, Rita Ohara, am acting as scribe for Derwood Kaplan, MD  I have reviewed this report as typed by the medical scribe, and it is complete and accurate.

## 2021-07-25 ENCOUNTER — Telehealth: Payer: Self-pay | Admitting: Oncology

## 2021-07-25 NOTE — Telephone Encounter (Signed)
07/25/21 Spoke with daughter who stated that her mother will not be back in town until Friday morning .Rescheduled appt to Friday afternoon.

## 2021-07-25 NOTE — Progress Notes (Signed)
[facility]  [address] [place],  [place]  [address] [phone]  Clinic Day:  07/29/2021  Referring physician: Townsend Roger, MD  This document serves as a record of services personally performed by Hosie Poisson, MD. It was created on their behalf by [facility], a trained medical scribe. The creation of this record is based on the scribe's personal observations and the provider's statements to them.  ASSESSMENT & PLAN:   Assessment & Plan:  Lung cancer, lower lobe ([place]), diagnosed in April 2022 She received 1 dose of chemotherapy in June 2022, with combination cisplatin/Taxol/pembrolizumab, and tolerated this very poorly and ended up in the hospital twice. She is now receiving palliative pembrolizumab since October, and tolerating well. CT chest from January 2023 is stable to mildly improved. She is ready to resume therapy and we will plan to rescan her after another 3 months.  Brain metastases [facility]) Brain metastases diagnosed in April 2022.  This presented with right sided head pain and pain of the right eye.  She received stereotactic radiation to the two brain lesions. MRI brain from September 26th revealed four metastatic deposits in the brain. Several show mild associated hemorrhage. Mild edema. No mass-effect or midline shift. We will continue to monitor these. MRI brain from January revealed a mixed response with one new enhancing lesion in the right cerebellar hemisphere measuring 3 mm. One lesion in the left frontal lobe has resolved, one lesion in the right occipital lobe is stable, and two lesions are mildly decreased in size. We will rescan in early May.  History of stroke, October 2020 She has residual right sided weakness and numbness of the right upper extremity.  Significant cardiac disease with atrial fibrillation and history of coronary artery bypass She is on anticoagulation.  Constipation I recommended using senna-S 2  tabs at least at bedtime and twice daily if needed to keep her bowels regular.  Shoulder pain, right Worsening right shoulder pain. 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 Right shoulder x-ray revealed diffusely decreased mineralization of the bones. No acute fracture or dislocation. Hypertrophic degenerative changes present at the acromioclavicular joint. Mild degenerative changes at the glenohumeral joint. Sternotomy wires are noted over the midline. The soft tissues are within normal limits. She has been placed on oxycodone 5 mg every 4 hours as needed. I advised her against taking more than 2 Tylenol 3 times a day.   She will proceed with her 5th cycle of pembrolizumab. We will plan to see her back in 3 weeks with a CBC and comprehensive metabolic panel prior to a 6th cycle of pembrolizumab. We will plan to repeat scans in early May. The patient understands the plans discussed today and is in agreement with them.  She knows to contact our office if she develops concerns prior to her next appointment.   I provided 15 minutes of face-to-face time during this this encounter and > 50% was spent counseling as documented under my assessment and plan.    [facility] [address] [place] [address] Dept: [phone] Dept Fax: [phone]   No orders of the defined types were placed in this encounter.     CHIEF COMPLAINT:  CC: Metastatic lung cancer to brain  Current Treatment:  Palliative pembrolizumab   HISTORY OF PRESENT ILLNESS:  [name] is a 76 y.o. female referred by Dr. Corene Cornea  Nona Dell for a transfer of care and continued management of metastatic lung cancer to the brain.  She states that her lung cancer first manifested as trouble of the right eye and pain which radiated to the right side of her head. MRI imaging in April 2022 revealed intrinsic hyperintense T1 signal  lesions within the right cerebrum demonstrate postcontrast enhancement consistent with hemorrhagic metastases.  One lesion was in the right occipital lobe and one in the posterior right frontal lobe.  She did have evidence of vasogenic edema and was placed on dexamethasone.  She also had evidence of multiple lacunar infarctions of the basal ganglia.  PET in May confirmed a hypermetabolic nodule in the right lower lobe consistent with bronchogenic carcinoma.  No evidence of other metastatic disease was observed.  She received 1 treatment of chemotherapy (possibly cisplatin/paclitaxel/pembrolizumab). She also underwent stereotactic radiation to the two brain lesions, which was completed in early June.  The patient struggled with chemotherapy and radiation and had to be hospitalized twice due to her severe weakness.  She declined to pursue further chemotherapy after that experience.  She moved from [place] to [place] in July to live with her daughter.  We only have limited records from her oncologist in [place]. Review of her pathology revealed poorly differentiated carcinoma consistent with lung primary and favoring squamous cell histology. The results of her Guardant testing were tracked down, which was negative for any targetable mutations. She has slowly improved to the point we felt she could consider additional therapy.   CT chest from September 14th revealed a spiculated right lower lobe pulmonary nodule measuring 2.0 x 1.9 cm, initially 1.8 cm, which likely represents the site of known bronchogenic neoplasm. Also with findings suspicious for right infrahilar nodal involvement despite small size. Smaller nodular foci in the right lower lobe suspicious for multifocal involvement, of uncertain significance in the absence of priors. Small nodules also in the contralateral lung near the apex are indeterminate. There is a 13 mm left adrenal nodule which is well-circumscribed and within 1-2 mm of its size  in 2018 not present in 2011. Findings more likely reflect adrenal adenoma.  MRI brain from September 26th revealed four metastatic deposits in the brain, 7 mm or less. Several show mild associated hemorrhage and mild edema. No mass-effect or midline shift. These results were reviewed with the patient. Chronic lacunar infarctions of the left side were seen. She is on anticoagulation with rivaroxaban. She was placed on palliative pembrolizumb and had her 1st cycle on October 14th.  CT chest from January 4th revealed stable to slight decrease in size of the dominant spiculated nodule within the right lower lobe, now measuring 2.0 x 1.6 cm, previously 2.0 x 1.9 cm. Additional small nodules within the right lung are unchanged in the interval. No new or progressive disease identified. Stable left adrenal gland nodule. MRI brain revealed mixed treatment response noting one new enhancing lesion in the right cerebellar hemisphere measuring 3 mm. One lesion in the left frontal lobe has resolved, one lesion in the right occipital lobe is stable, and two lesions are mildly decreased in size.   Oncology History  Lung cancer, lower lobe ([place])  07/07/2020 Initial Diagnosis   Lung cancer, lower lobe ([place])   10/15/2020 Cancer Staging   Staging form: Lung, AJCC 8th Edition - Clinical stage from 10/15/2020: Stage IV (cT1b, cN0, cM1) - Signed by Derwood Kaplan, MD on 03/31/2021 Histopathologic type: Squamous cell carcinoma, NOS Stage prefix: Initial diagnosis  Histologic grade (G): G3 Histologic grading system: 4 grade system Laterality: Right Tumor size (mm): 18 Sites of metastasis: Brain Lymph-vascular invasion (LVI): Presence of LVI unknown/indeterminate Diagnostic confirmation: Positive histology Specimen type: Core Needle Biopsy Staged by: Managing physician Type of lung cancer: Locally advanced or metastatic non-small cell lung cancer Stage used in treatment planning: Yes National guidelines used in  treatment planning: Yes Type of national guideline used in treatment planning: NCCN    04/08/2021 -  Chemotherapy   Patient is on Treatment Plan : LUNG NSCLC flat dose Pembrolizumab Q21D        INTERVAL HISTORY:  Najae is here for routine follow up prior to a 5th cycle of pembrolizumab. This dose was postponed 1 month to allow the patient to care for her son in [place]. She states that she is doing fairly well, but does note arthritis of the back which she rates as a 9/10, from traveling all day today. Blood counts and chemistries are unremarkable, including a hemoglobin of 12.5, previously 11.7. Her  appetite is good, and she has lost 4 pounds since her last visit.  She denies fever, chills or other signs of infection.  She denies nausea, vomiting, bowel issues, or abdominal pain.  She denies sore throat, cough, dyspnea, or chest pain. She is accompanied by her son Legrand Como today. Unfortunately, he has a very large basal cell carcinoma of the left posterior shoulder.  REVIEW OF SYSTEMS:  Review of Systems  Constitutional: Negative.  Negative for appetite change, chills, fatigue, fever and unexpected weight change.  HENT:  Negative.    Eyes: Negative.   Respiratory: Negative.  Negative for chest tightness, cough, hemoptysis, shortness of breath and wheezing.   Cardiovascular: Negative.  Negative for chest pain, leg swelling and palpitations.  Gastrointestinal: Negative.  Negative for abdominal distention, abdominal pain, blood in stool, constipation, diarrhea, nausea and vomiting.  Endocrine: Negative.   Genitourinary: Negative.  Negative for difficulty urinating, dysuria, frequency and hematuria.   Musculoskeletal:  Positive for arthralgias and back pain. Negative for flank pain, gait problem and myalgias.  Skin: Negative.   Neurological:  Positive for extremity weakness (of the right side, chronic since prior stroke). Negative for dizziness, gait problem, headaches, light-headedness,  numbness, seizures and speech difficulty.  Hematological: Negative.   Psychiatric/Behavioral: Negative.  Negative for depression and sleep disturbance. The patient is not nervous/anxious.     VITALS:  Blood pressure (!) 167/75, pulse 67, temperature 97.9 F (36.6 C), temperature source Oral, resp. rate 18, height 5\' 7"  (1.702 m), weight 129 lb 1.6 oz (58.6 kg), SpO2 97 %.  Wt Readings from Last 3 Encounters:  07/29/21 129 lb 1.6 oz (58.6 kg)  07/01/21 133 lb (60.3 kg)  06/13/21 128 lb (58.1 kg)    Body mass index is 20.22 kg/m.  Performance status (ECOG): 1 - Symptomatic but completely ambulatory  PHYSICAL EXAM:  Physical Exam Constitutional:      General: She is not in acute distress.    Appearance: Normal appearance. She is normal weight.  HENT:     Head: Normocephalic and atraumatic.  Eyes:     General: No scleral icterus.    Extraocular Movements: Extraocular movements intact.     Conjunctiva/sclera: Conjunctivae normal.     Pupils: Pupils are equal, round, and reactive to light.  Cardiovascular:     Rate and Rhythm: Normal rate and regular rhythm.     Pulses: Normal pulses.     Heart sounds: Normal heart sounds. No murmur  heard.   No friction rub. No gallop.  Pulmonary:     Effort: Pulmonary effort is normal. No respiratory distress.     Breath sounds: Rhonchi (mild, expiratory) present.  Abdominal:     General: Bowel sounds are normal. There is no distension.     Palpations: Abdomen is soft. There is no hepatomegaly, splenomegaly or mass.     Tenderness: There is no abdominal tenderness.  Musculoskeletal:        General: Normal range of motion.     Cervical back: Normal range of motion and neck supple.     Right lower leg: No edema.     Left lower leg: No edema.  Lymphadenopathy:     Cervical: No cervical adenopathy.  Skin:    General: Skin is warm and dry.  Neurological:     General: No focal deficit present.     Mental Status: She is alert and oriented to  person, place, and time. Mental status is at baseline.     Motor: Weakness (Right hemiparesis mainly affecting her upper extremity) present.  Psychiatric:        Mood and Affect: Mood normal.        Behavior: Behavior normal.        Thought Content: Thought content normal.        Judgment: Judgment normal.   LABS:   CBC Latest Ref Rng & Units 07/29/2021 07/01/2021 06/10/2021  WBC - 6.8 7.4 8.9  Hemoglobin 12.0 - 16.0 12.5 11.7(A) 12.6  Hematocrit 36 - 46 37 35(A) 37  Platelets 150 - 399 191 196 195   CMP Latest Ref Rng & Units 07/29/2021 07/01/2021 06/10/2021  Glucose 65 - 99 mg/dL - - -  BUN 4 - 21 14 12 15   Creatinine 0.5 - 1.1 0.9 0.8 0.8  Sodium 137 - 147 140 139 139  Potassium 3.4 - 5.3 4.0 4.3 4.2  Chloride 99 - 108 107 108 108  CO2 13 - 22 28(A) 25(A) 22  Calcium 8.7 - 10.7 9.2 8.9 9.3  Total Protein 6.0 - 8.3 g/dL - - -  Total Bilirubin 0.3 - 1.2 mg/dL - - -  Alkaline Phos 25 - 125 67 66 68  AST 13 - 35 23 26 34  ALT 7 - 35 17 19 21      Lab Results  Component Value Date   CEA1 1.2 02/04/2021   /  CEA  Date Value Ref Range Status  02/04/2021 1.2 0.0 - 4.7 ng/mL Final    Comment:    (NOTE)                             Nonsmokers          <3.9                             Smokers             <5.6 [employer] Electrochemiluminescence Immunoassay (ECLIA) Values obtained with different assay methods or kits cannot be used interchangeably.  Results cannot be interpreted as absolute evidence of the presence or absence of malignant disease. Performed At: [facility] [place], [place] [id number] Rush Farmer MD [id number]     Lab Results  Component Value Date   TIBC 364 02/10/2021   FERRITIN 22 02/10/2021   IRONPCTSAT 17 02/10/2021   No results found for: LDH  STUDIES:  No results found.    HISTORY:   Allergies:  Allergies  Allergen Reactions   Ergocalciferol Nausea And Vomiting    Current Medications: Current  Outpatient Medications  Medication Sig Dispense Refill   ALPRAZolam (XANAX) 0.5 MG tablet Take 1 tablet (0.5 mg total) by mouth 3 (three) times daily as needed for anxiety. 90 tablet 0   atorvastatin (LIPITOR) 80 MG tablet Take 80 mg by mouth daily.     brimonidine (ALPHAGAN) 0.2 % ophthalmic solution in the morning and at bedtime. (Patient not taking: Reported on 04/18/2021)     brimonidine-timolol (COMBIGAN) 0.2-0.5 % ophthalmic solution Place 1 drop into the right eye 2 (two) times daily. (Patient not taking: Reported on 04/18/2021)     dexamethasone (DECADRON) 4 MG tablet Take 4 mg by mouth daily as needed.     gabapentin (NEURONTIN) 100 MG capsule Take by mouth.     glipiZIDE (GLUCOTROL XL) 5 MG 24 hr tablet Take 5 mg by mouth daily.     HUMULIN R 100 UNIT/ML injection      JANUVIA 100 MG tablet Take 100 mg by mouth daily.     LORazepam (ATIVAN) 0.5 MG tablet Take 1 tablet (0.5 mg total) by mouth at bedtime. 30 tablet 0   losartan (COZAAR) 25 MG tablet Take 25 mg by mouth daily.     metoprolol tartrate (LOPRESSOR) 50 MG tablet Take 50 mg by mouth as needed.     mirtazapine (REMERON) 7.5 MG tablet TAKE 1 TABLET BY MOUTH AT BEDTIME. 90 tablet 2   mupirocin cream (BACTROBAN) 2 % Apply topically.     ofloxacin (OCUFLOX) 0.3 % ophthalmic solution Place into the left eye. (Patient not taking: Reported on 04/18/2021)     omeprazole (PRILOSEC) 40 MG capsule Take 40 mg by mouth every morning.     ondansetron (ZOFRAN) 4 MG tablet Take 1 tablet (4 mg total) by mouth every 4 (four) hours as needed for nausea. 90 tablet 3   oxyCODONE (OXY IR/ROXICODONE) 5 MG immediate release tablet Take 1 tablet (5 mg total) by mouth every 4 (four) hours as needed for severe pain. 30 tablet 0   prednisoLONE acetate (PRED FORTE) 1 % ophthalmic suspension Place into the left eye.     prochlorperazine (COMPAZINE) 10 MG tablet Take 1 tablet (10 mg total) by mouth every 6 (six) hours as needed for nausea or vomiting. 90  tablet 3   rivaroxaban (XARELTO) 20 MG TABS tablet Take 20 mg by mouth daily with supper.     No current facility-administered medications for this visit.     I, Rita Ohara, am acting as scribe for Derwood Kaplan, MD  I have reviewed this report as typed by the medical scribe, and it is complete and accurate.

## 2021-07-28 ENCOUNTER — Ambulatory Visit: Payer: Commercial Managed Care - HMO | Admitting: Oncology

## 2021-07-28 ENCOUNTER — Other Ambulatory Visit: Payer: Commercial Managed Care - HMO

## 2021-07-29 ENCOUNTER — Telehealth: Payer: Self-pay | Admitting: Oncology

## 2021-07-29 ENCOUNTER — Encounter: Payer: Self-pay | Admitting: Oncology

## 2021-07-29 ENCOUNTER — Inpatient Hospital Stay (INDEPENDENT_AMBULATORY_CARE_PROVIDER_SITE_OTHER): Payer: Medicare Other | Admitting: Oncology

## 2021-07-29 ENCOUNTER — Inpatient Hospital Stay: Payer: Medicare Other | Attending: Oncology

## 2021-07-29 VITALS — BP 167/75 | HR 67 | Temp 97.9°F | Resp 18 | Ht 67.0 in | Wt 129.1 lb

## 2021-07-29 DIAGNOSIS — D649 Anemia, unspecified: Secondary | ICD-10-CM | POA: Diagnosis not present

## 2021-07-29 DIAGNOSIS — Z8673 Personal history of transient ischemic attack (TIA), and cerebral infarction without residual deficits: Secondary | ICD-10-CM | POA: Diagnosis not present

## 2021-07-29 DIAGNOSIS — K5900 Constipation, unspecified: Secondary | ICD-10-CM

## 2021-07-29 DIAGNOSIS — M25511 Pain in right shoulder: Secondary | ICD-10-CM | POA: Diagnosis not present

## 2021-07-29 DIAGNOSIS — Z79899 Other long term (current) drug therapy: Secondary | ICD-10-CM | POA: Diagnosis not present

## 2021-07-29 DIAGNOSIS — I4891 Unspecified atrial fibrillation: Secondary | ICD-10-CM | POA: Diagnosis not present

## 2021-07-29 DIAGNOSIS — C7931 Secondary malignant neoplasm of brain: Secondary | ICD-10-CM | POA: Insufficient documentation

## 2021-07-29 DIAGNOSIS — Z5112 Encounter for antineoplastic immunotherapy: Secondary | ICD-10-CM | POA: Diagnosis not present

## 2021-07-29 DIAGNOSIS — C3431 Malignant neoplasm of lower lobe, right bronchus or lung: Secondary | ICD-10-CM

## 2021-07-29 DIAGNOSIS — C3490 Malignant neoplasm of unspecified part of unspecified bronchus or lung: Secondary | ICD-10-CM | POA: Diagnosis not present

## 2021-07-29 LAB — TSH: TSH: 1.501 u[IU]/mL (ref 0.350–4.500)

## 2021-07-29 LAB — COMPREHENSIVE METABOLIC PANEL
Albumin: 3.9 (ref 3.5–5.0)
Calcium: 9.2 (ref 8.7–10.7)

## 2021-07-29 LAB — BASIC METABOLIC PANEL
BUN: 14 (ref 4–21)
CO2: 28 — AB (ref 13–22)
Chloride: 107 (ref 99–108)
Creatinine: 0.9 (ref 0.5–1.1)
Glucose: 174
Potassium: 4 (ref 3.4–5.3)
Sodium: 140 (ref 137–147)

## 2021-07-29 LAB — HEPATIC FUNCTION PANEL
ALT: 17 (ref 7–35)
AST: 23 (ref 13–35)
Alkaline Phosphatase: 67 (ref 25–125)
Bilirubin, Total: 0.6

## 2021-07-29 LAB — CBC AND DIFFERENTIAL
HCT: 37 (ref 36–46)
Hemoglobin: 12.5 (ref 12.0–16.0)
Neutrophils Absolute: 3.81
Platelets: 191 (ref 150–399)
WBC: 6.8

## 2021-07-29 LAB — CBC: RBC: 4.22 (ref 3.87–5.11)

## 2021-07-29 MED FILL — Pembrolizumab IV Soln 100 MG/4ML (25 MG/ML): INTRAVENOUS | Qty: 8 | Status: AC

## 2021-07-29 NOTE — Telephone Encounter (Signed)
Per 07/29/21 los next appt confirmed with patient

## 2021-07-30 LAB — T4: T4, Total: 8.2 ug/dL (ref 4.5–12.0)

## 2021-08-01 ENCOUNTER — Other Ambulatory Visit: Payer: Self-pay

## 2021-08-01 ENCOUNTER — Inpatient Hospital Stay: Payer: Medicare Other

## 2021-08-01 VITALS — BP 132/64 | HR 65 | Temp 98.1°F | Resp 18 | Ht 67.0 in | Wt 130.0 lb

## 2021-08-01 DIAGNOSIS — Z5112 Encounter for antineoplastic immunotherapy: Secondary | ICD-10-CM | POA: Diagnosis not present

## 2021-08-01 DIAGNOSIS — C7931 Secondary malignant neoplasm of brain: Secondary | ICD-10-CM | POA: Diagnosis not present

## 2021-08-01 DIAGNOSIS — Z79899 Other long term (current) drug therapy: Secondary | ICD-10-CM | POA: Diagnosis not present

## 2021-08-01 DIAGNOSIS — C3431 Malignant neoplasm of lower lobe, right bronchus or lung: Secondary | ICD-10-CM

## 2021-08-01 MED ORDER — SODIUM CHLORIDE 0.9% FLUSH: 10.0000 mL | INTRAVENOUS | Status: DC | PRN

## 2021-08-01 MED ORDER — SODIUM CHLORIDE 0.9 % IV SOLN: Freq: Once | INTRAVENOUS | Status: AC

## 2021-08-01 MED ORDER — HEPARIN SOD (PORK) LOCK FLUSH 100 UNIT/ML IV SOLN: 500.0000 [IU] | Freq: Once | INTRAVENOUS | Status: DC | PRN

## 2021-08-01 MED ORDER — SODIUM CHLORIDE 0.9 % IV SOLN
200.0000 mg | Freq: Once | INTRAVENOUS | Status: AC
  Administered 2021-08-01: 200 mg via INTRAVENOUS
  Filled 2021-08-01: qty 8

## 2021-08-01 NOTE — Patient Instructions (Signed)
Pembrolizumab injection What is this medication? PEMBROLIZUMAB (pem broe liz ue mab) is a monoclonal antibody. It is used to treat certain types of cancer. This medicine may be used for other purposes; ask your health care provider or pharmacist if you have questions. COMMON BRAND NAME(S): Keytruda What should I tell my care team before I take this medication? They need to know if you have any of these conditions: autoimmune diseases like Crohn's disease, ulcerative colitis, or lupus have had or planning to have an allogeneic stem cell transplant (uses someone else's stem cells) history of organ transplant history of chest radiation nervous system problems like myasthenia gravis or Guillain-Barre syndrome an unusual or allergic reaction to pembrolizumab, other medicines, foods, dyes, or preservatives pregnant or trying to get pregnant breast-feeding How should I use this medication? This medicine is for infusion into a vein. It is given by a health care professional in a hospital or clinic setting. A special MedGuide will be given to you before each treatment. Be sure to read this information carefully each time. Talk to your pediatrician regarding the use of this medicine in children. While this drug may be prescribed for children as young as 6 months for selected conditions, precautions do apply. Overdosage: If you think you have taken too much of this medicine contact a poison control center or emergency room at once. NOTE: This medicine is only for you. Do not share this medicine with others. What if I miss a dose? It is important not to miss your dose. Call your doctor or health care professional if you are unable to keep an appointment. What may interact with this medication? Interactions have not been studied. This list may not describe all possible interactions. Give your health care provider a list of all the medicines, herbs, non-prescription drugs, or dietary supplements you use.  Also tell them if you smoke, drink alcohol, or use illegal drugs. Some items may interact with your medicine. What should I watch for while using this medication? Your condition will be monitored carefully while you are receiving this medicine. You may need blood work done while you are taking this medicine. Do not become pregnant while taking this medicine or for 4 months after stopping it. Women should inform their doctor if they wish to become pregnant or think they might be pregnant. There is a potential for serious side effects to an unborn child. Talk to your health care professional or pharmacist for more information. Do not breast-feed an infant while taking this medicine or for 4 months after the last dose. What side effects may I notice from receiving this medication? Side effects that you should report to your doctor or health care professional as soon as possible: allergic reactions like skin rash, itching or hives, swelling of the face, lips, or tongue bloody or black, tarry breathing problems changes in vision chest pain chills confusion constipation cough diarrhea dizziness or feeling faint or lightheaded fast or irregular heartbeat fever flushing joint pain low blood counts - this medicine may decrease the number of white blood cells, red blood cells and platelets. You may be at increased risk for infections and bleeding. muscle pain muscle weakness pain, tingling, numbness in the hands or feet persistent headache redness, blistering, peeling or loosening of the skin, including inside the mouth signs and symptoms of high blood sugar such as dizziness; dry mouth; dry skin; fruity breath; nausea; stomach pain; increased hunger or thirst; increased urination signs and symptoms of kidney injury like trouble  passing urine or change in the amount of urine signs and symptoms of liver injury like dark urine, light-colored stools, loss of appetite, nausea, right upper belly pain,  yellowing of the eyes or skin sweating swollen lymph nodes weight loss Side effects that usually do not require medical attention (report to your doctor or health care professional if they continue or are bothersome): decreased appetite hair loss tiredness This list may not describe all possible side effects. Call your doctor for medical advice about side effects. You may report side effects to FDA at 1-800-FDA-1088. Where should I keep my medication? This drug is given in a hospital or clinic and will not be stored at home. NOTE: This sheet is a summary. It may not cover all possible information. If you have questions about this medicine, talk to your doctor, pharmacist, or health care provider.  2022 Elsevier/Gold Standard (2021-03-01 00:00:00)

## 2021-08-05 ENCOUNTER — Encounter: Payer: Self-pay | Admitting: Oncology

## 2021-08-07 ENCOUNTER — Encounter: Payer: Self-pay | Admitting: Oncology

## 2021-08-19 ENCOUNTER — Inpatient Hospital Stay (INDEPENDENT_AMBULATORY_CARE_PROVIDER_SITE_OTHER): Payer: Medicare Other | Admitting: Hematology and Oncology

## 2021-08-19 ENCOUNTER — Inpatient Hospital Stay: Payer: Medicare Other

## 2021-08-19 ENCOUNTER — Telehealth: Payer: Self-pay | Admitting: Hematology and Oncology

## 2021-08-19 ENCOUNTER — Other Ambulatory Visit: Payer: Self-pay

## 2021-08-19 ENCOUNTER — Encounter: Payer: Self-pay | Admitting: Hematology and Oncology

## 2021-08-19 DIAGNOSIS — M25511 Pain in right shoulder: Secondary | ICD-10-CM | POA: Diagnosis not present

## 2021-08-19 DIAGNOSIS — C3431 Malignant neoplasm of lower lobe, right bronchus or lung: Secondary | ICD-10-CM | POA: Diagnosis not present

## 2021-08-19 DIAGNOSIS — Z79899 Other long term (current) drug therapy: Secondary | ICD-10-CM | POA: Diagnosis not present

## 2021-08-19 DIAGNOSIS — C7931 Secondary malignant neoplasm of brain: Secondary | ICD-10-CM | POA: Diagnosis not present

## 2021-08-19 DIAGNOSIS — Z5112 Encounter for antineoplastic immunotherapy: Secondary | ICD-10-CM | POA: Diagnosis not present

## 2021-08-19 LAB — HEPATIC FUNCTION PANEL
ALT: 16 (ref 7–35)
AST: 21 (ref 13–35)
Alkaline Phosphatase: 71 (ref 25–125)
Bilirubin, Total: 0.7

## 2021-08-19 LAB — CBC AND DIFFERENTIAL
HCT: 35 — AB (ref 36–46)
Hemoglobin: 11.7 — AB (ref 12.0–16.0)
Neutrophils Absolute: 3.4
Platelets: 189 (ref 150–399)
WBC: 6.3

## 2021-08-19 LAB — BASIC METABOLIC PANEL
BUN: 16 (ref 4–21)
CO2: 25 — AB (ref 13–22)
Chloride: 104 (ref 99–108)
Creatinine: 0.8 (ref 0.5–1.1)
Glucose: 136
Potassium: 4.2 (ref 3.4–5.3)
Sodium: 137 (ref 137–147)

## 2021-08-19 LAB — COMPREHENSIVE METABOLIC PANEL
Albumin: 4 (ref 3.5–5.0)
Calcium: 8.8 (ref 8.7–10.7)

## 2021-08-19 LAB — CBC: RBC: 4.06 (ref 3.87–5.11)

## 2021-08-19 LAB — TSH: TSH: 1.721 u[IU]/mL (ref 0.350–4.500)

## 2021-08-19 MED ORDER — ALPRAZOLAM 0.5 MG PO TABS
0.5000 mg | ORAL_TABLET | Freq: Three times a day (TID) | ORAL | 0 refills | Status: AC | PRN
Start: 2021-08-19 — End: ?

## 2021-08-19 MED ORDER — OXYCODONE HCL 10 MG PO TABS: 10.0000 mg | ORAL_TABLET | ORAL | 0 refills | Status: DC | PRN

## 2021-08-19 MED FILL — Pembrolizumab IV Soln 100 MG/4ML (25 MG/ML): INTRAVENOUS | Qty: 8 | Status: AC

## 2021-08-19 NOTE — Assessment & Plan Note (Signed)
She received 1 dose of chemotherapy in June 2022, withcombination cisplatin/Taxol/pembrolizumab, and tolerated this very poorlyand ended up in the hospitaltwice. She is now receiving palliative pembrolizumab since October, and tolerating well. CT chest from January 2023 is stable to mildly improved. She is ready to resume therapy and we will plan to rescan her after another 3 months. She is tolerating pembrolizumab well. She will proceed with cycle 6 Monday. She will return to clinic in 3 weeks for repeat evaluation.

## 2021-08-19 NOTE — Assessment & Plan Note (Signed)
Brain metastases diagnosed in April 2022. This presented with right sided head pain and pain of the right eye. She received stereotactic radiation to the two brain lesions. MRI brain fromSeptember 26th revealed four metastatic deposits in the brain. Several show mild associated hemorrhage. Mild edema. No mass-effect or midline shift. We will continue to monitor these. MRI brain from January revealed a mixed response with one new enhancing lesion in the right cerebellar hemisphere measuring 3 mm. One lesion in the left frontal lobe has resolved, one lesion in the right occipital lobe is stable, and two lesions are mildly decreased in size. We will rescan in early May.

## 2021-08-19 NOTE — Assessment & Plan Note (Signed)
Worsening right shoulder pain. 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 Right shoulder x-ray revealed diffusely decreased mineralization of the bones. No acute fracture or dislocation. Hypertrophic degenerative changes present at the acromioclavicular joint. Mild degenerative changes at the glenohumeral joint. Sternotomy wires are noted over the midline. The soft tissues are within normal limits.I will increase her oxycodone today to 10 mg as she states the 5 mg is not relieving her pain.

## 2021-08-19 NOTE — Progress Notes (Signed)
Patient Care Team: Townsend Roger, MD as PCP - General (Internal Medicine) Sharmon Revere as Physician Assistant (Cardiology)  Clinic Day:  08/19/2021  Referring physician: Townsend Roger, MD  ASSESSMENT & PLAN:   Assessment & Plan: Lung cancer, lower lobe [facility]) She received 1 dose of chemotherapy in June 2022, with combination cisplatin/Taxol/pembrolizumab, and tolerated this very poorly and ended up in the hospital twice. She is now receiving palliative pembrolizumab since October, and tolerating well. CT chest from January 2023 is stable to mildly improved. She is ready to resume therapy and we will plan to rescan her after another 3 months. She is tolerating pembrolizumab well. She will proceed with cycle 6 Monday. She will return to clinic in 3 weeks for repeat evaluation.  Brain metastases [facility]) Brain metastases diagnosed in April 2022.  This presented with right sided head pain and pain of the right eye.  She received stereotactic radiation to the two brain lesions. MRI brain from September 26th revealed four metastatic deposits in the brain. Several show mild associated hemorrhage. Mild edema. No mass-effect or midline shift. We will continue to monitor these. MRI brain from January revealed a mixed response with one new enhancing lesion in the right cerebellar hemisphere measuring 3 mm. One lesion in the left frontal lobe has resolved, one lesion in the right occipital lobe is stable, and two lesions are mildly decreased in size. We will rescan in early May.  Shoulder pain, right Worsening right shoulder pain. 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 Right shoulder x-ray revealed diffusely decreased mineralization of the bones. No acute fracture or dislocation. Hypertrophic degenerative changes present at the acromioclavicular joint. Mild degenerative changes at the glenohumeral joint. Sternotomy wires are noted over the midline. The soft  tissues are within normal limits.I will increase her oxycodone today to 10 mg as she states the 5 mg is not relieving her pain.     The patient understands the plans discussed today and is in agreement with them.  She knows to contact our office if she develops concerns prior to her next appointment.    [name], NP  [place] [address] [place] [address] Dept: [phone] Dept Fax: [phone]   No orders of the defined types were placed in this encounter.     CHIEF COMPLAINT:  CC: A 76 year old female with history of lung cancer here for 3 week evalutation  Current Treatment:  Pembrolizumab  INTERVAL HISTORY:  [name] is here today for repeat clinical assessment. She denies fevers or chills. She denies pain. Her appetite is good. Her weight has been stable.  I have reviewed the past medical history, past surgical history, social history and family history with the patient and they are unchanged from previous note.  ALLERGIES:  is allergic to ergocalciferol.  MEDICATIONS:  Current Outpatient Medications  Medication Sig Dispense Refill   Oxycodone HCl 10 MG TABS Take 1 tablet (10 mg total) by mouth every 4 (four) hours as needed. 180 tablet 0   ALPRAZolam (XANAX) 0.5 MG tablet Take 1 tablet (0.5 mg total) by mouth 3 (three) times daily as needed for anxiety. 90 tablet 0   atorvastatin (LIPITOR) 80 MG tablet Take 80 mg by mouth daily.     brimonidine (ALPHAGAN) 0.2 % ophthalmic solution in the morning and at bedtime. (Patient not taking: Reported on 04/18/2021)  brimonidine-timolol (COMBIGAN) 0.2-0.5 % ophthalmic solution Place 1 drop into the right eye 2 (two) times daily. (Patient not taking: Reported on 04/18/2021)     dexamethasone (DECADRON) 4 MG tablet Take 4 mg by mouth daily as needed.     gabapentin (NEURONTIN) 100 MG capsule Take by mouth.     glipiZIDE (GLUCOTROL XL) 5 MG 24 hr  tablet Take 5 mg by mouth daily.     HUMULIN R 100 UNIT/ML injection      JANUVIA 100 MG tablet Take 100 mg by mouth daily.     LORazepam (ATIVAN) 0.5 MG tablet Take 1 tablet (0.5 mg total) by mouth at bedtime. 30 tablet 0   losartan (COZAAR) 25 MG tablet Take 25 mg by mouth daily.     metoprolol tartrate (LOPRESSOR) 50 MG tablet Take 50 mg by mouth as needed.     mirtazapine (REMERON) 7.5 MG tablet TAKE 1 TABLET BY MOUTH AT BEDTIME. 90 tablet 2   mupirocin cream (BACTROBAN) 2 % Apply topically.     ofloxacin (OCUFLOX) 0.3 % ophthalmic solution Place into the left eye. (Patient not taking: Reported on 04/18/2021)     omeprazole (PRILOSEC) 40 MG capsule Take 40 mg by mouth every morning.     ondansetron (ZOFRAN) 4 MG tablet Take 1 tablet (4 mg total) by mouth every 4 (four) hours as needed for nausea. 90 tablet 3   prednisoLONE acetate (PRED FORTE) 1 % ophthalmic suspension Place into the left eye.     prochlorperazine (COMPAZINE) 10 MG tablet Take 1 tablet (10 mg total) by mouth every 6 (six) hours as needed for nausea or vomiting. 90 tablet 3   rivaroxaban (XARELTO) 20 MG TABS tablet Take 20 mg by mouth daily with supper.     No current facility-administered medications for this visit.    HISTORY OF PRESENT ILLNESS:   Oncology History  Lung cancer, lower lobe ([place])  07/07/2020 Initial Diagnosis   Lung cancer, lower lobe ([place])   10/15/2020 Cancer Staging   Staging form: Lung, AJCC 8th Edition - Clinical stage from 10/15/2020: Stage IV (cT1b, cN0, cM1) - Signed by Derwood Kaplan, MD on 03/31/2021 Histopathologic type: Squamous cell carcinoma, NOS Stage prefix: Initial diagnosis Histologic grade (G): G3 Histologic grading system: 4 grade system Laterality: Right Tumor size (mm): 18 Sites of metastasis: Brain Lymph-vascular invasion (LVI): Presence of LVI unknown/indeterminate Diagnostic confirmation: Positive histology Specimen type: Core Needle Biopsy Staged by: Managing  physician Type of lung cancer: Locally advanced or metastatic non-small cell lung cancer Stage used in treatment planning: Yes National guidelines used in treatment planning: Yes Type of national guideline used in treatment planning: NCCN    04/08/2021 -  Chemotherapy   Patient is on Treatment Plan : LUNG NSCLC flat dose Pembrolizumab Q21D         REVIEW OF SYSTEMS:   Constitutional: Denies fevers, chills or abnormal weight loss Eyes: Denies blurriness of vision Ears, nose, mouth, throat, and face: Denies mucositis or sore throat Respiratory: Denies cough, dyspnea or wheezes Cardiovascular: Denies palpitation, chest discomfort or lower extremity swelling Gastrointestinal:  Denies nausea, heartburn or change in bowel habits Skin: Denies abnormal skin rashes Lymphatics: Denies new lymphadenopathy or easy bruising Neurological:Denies numbness, tingling or new weaknesses Behavioral/Psych: Mood is stable, no new changes  All other systems were reviewed with the patient and are negative.   VITALS:  Blood pressure (!) 152/63, pulse 63, temperature 98.4 F (36.9 C), temperature source Oral, resp. rate 18, height 5'  6.25" (1.683 m), weight 129 lb 12.8 oz (58.9 kg), SpO2 95 %.  Wt Readings from Last 3 Encounters:  08/19/21 129 lb 12.8 oz (58.9 kg)  08/01/21 130 lb 0.6 oz (59 kg)  07/29/21 129 lb 1.6 oz (58.6 kg)    Body mass index is 20.79 kg/m.  Performance status (ECOG): 1 - Symptomatic but completely ambulatory  PHYSICAL EXAM:   GENERAL:alert, no distress and comfortable SKIN: skin color, texture, turgor are normal, no rashes or significant lesions EYES: normal, Conjunctiva are pink and non-injected, sclera clear OROPHARYNX:no exudate, no erythema and lips, buccal mucosa, and tongue normal  NECK: supple, thyroid normal size, non-tender, without nodularity LYMPH:  no palpable lymphadenopathy in the cervical, axillary or inguinal LUNGS: clear to auscultation and percussion  with normal breathing effort HEART: regular rate & rhythm and no murmurs and no lower extremity edema ABDOMEN:abdomen soft, non-tender and normal bowel sounds Musculoskeletal:no cyanosis of digits and no clubbing  NEURO: alert & oriented x 3 with fluent speech, no focal motor/sensory deficits  LABORATORY DATA:  I have reviewed the data as listed    Component Value Date/Time   NA 137 08/19/2021 0000   K 4.2 08/19/2021 0000   CL 104 08/19/2021 0000   CO2 25 (A) 08/19/2021 0000   GLUCOSE 83 10/20/2016 1618   BUN 16 08/19/2021 0000   CREATININE 0.8 08/19/2021 0000   CREATININE 1.07 (H) 10/20/2016 1618   CALCIUM 8.8 08/19/2021 0000   PROT 6.7 08/26/2010 0051   ALBUMIN 4.0 08/19/2021 0000   AST 21 08/19/2021 0000   ALT 16 08/19/2021 0000   ALKPHOS 71 08/19/2021 0000   BILITOT 0.5 08/26/2010 0051   GFRNONAA 51 (L) 10/20/2016 1618   GFRAA 59 (L) 10/20/2016 1618    No results found for: SPEP, UPEP  Lab Results  Component Value Date   WBC 6.3 08/19/2021   NEUTROABS 3.40 08/19/2021   HGB 11.7 (A) 08/19/2021   HCT 35 (A) 08/19/2021   MCV 89 06/10/2021   PLT 189 08/19/2021      Chemistry      Component Value Date/Time   NA 137 08/19/2021 0000   K 4.2 08/19/2021 0000   CL 104 08/19/2021 0000   CO2 25 (A) 08/19/2021 0000   BUN 16 08/19/2021 0000   CREATININE 0.8 08/19/2021 0000   CREATININE 1.07 (H) 10/20/2016 1618   GLU 136 08/19/2021 0000      Component Value Date/Time   CALCIUM 8.8 08/19/2021 0000   ALKPHOS 71 08/19/2021 0000   AST 21 08/19/2021 0000   ALT 16 08/19/2021 0000   BILITOT 0.5 08/26/2010 0051       RADIOGRAPHIC STUDIES: I have personally reviewed the radiological images as listed and agreed with the findings in the report. No results found.

## 2021-08-19 NOTE — Telephone Encounter (Signed)
Per 08/19/21 los next appt scheduled and confirmed with patient

## 2021-08-20 LAB — T4: T4, Total: 7.9 ug/dL (ref 4.5–12.0)

## 2021-08-22 ENCOUNTER — Inpatient Hospital Stay: Payer: Medicare Other

## 2021-08-22 ENCOUNTER — Other Ambulatory Visit: Payer: Self-pay

## 2021-08-22 ENCOUNTER — Encounter: Payer: Self-pay | Admitting: Oncology

## 2021-08-22 VITALS — BP 144/64 | HR 57 | Temp 98.2°F | Resp 18 | Ht 65.95 in | Wt 127.0 lb

## 2021-08-22 DIAGNOSIS — C7931 Secondary malignant neoplasm of brain: Secondary | ICD-10-CM | POA: Diagnosis not present

## 2021-08-22 DIAGNOSIS — C3431 Malignant neoplasm of lower lobe, right bronchus or lung: Secondary | ICD-10-CM

## 2021-08-22 DIAGNOSIS — Z79899 Other long term (current) drug therapy: Secondary | ICD-10-CM | POA: Diagnosis not present

## 2021-08-22 DIAGNOSIS — Z5112 Encounter for antineoplastic immunotherapy: Secondary | ICD-10-CM | POA: Diagnosis not present

## 2021-08-22 MED ORDER — SODIUM CHLORIDE 0.9% FLUSH: 10.0000 mL | INTRAVENOUS | Status: DC | PRN

## 2021-08-22 MED ORDER — HEPARIN SOD (PORK) LOCK FLUSH 100 UNIT/ML IV SOLN: 500.0000 [IU] | Freq: Once | INTRAVENOUS | Status: DC | PRN

## 2021-08-22 MED ORDER — SODIUM CHLORIDE 0.9 % IV SOLN
200.0000 mg | Freq: Once | INTRAVENOUS | Status: AC
  Administered 2021-08-22: 200 mg via INTRAVENOUS
  Filled 2021-08-22: qty 8

## 2021-08-22 MED ORDER — SODIUM CHLORIDE 0.9 % IV SOLN: Freq: Once | INTRAVENOUS | Status: AC

## 2021-08-22 NOTE — Patient Instructions (Signed)
Pembrolizumab injection What is this medication? PEMBROLIZUMAB (pem broe liz ue mab) is a monoclonal antibody. It is used to treat certain types of cancer. This medicine may be used for other purposes; ask your health care provider or pharmacist if you have questions. COMMON BRAND NAME(S): Keytruda What should I tell my care team before I take this medication? They need to know if you have any of these conditions: autoimmune diseases like Crohn's disease, ulcerative colitis, or lupus have had or planning to have an allogeneic stem cell transplant (uses someone else's stem cells) history of organ transplant history of chest radiation nervous system problems like myasthenia gravis or Guillain-Barre syndrome an unusual or allergic reaction to pembrolizumab, other medicines, foods, dyes, or preservatives pregnant or trying to get pregnant breast-feeding How should I use this medication? This medicine is for infusion into a vein. It is given by a health care professional in a hospital or clinic setting. A special MedGuide will be given to you before each treatment. Be sure to read this information carefully each time. Talk to your pediatrician regarding the use of this medicine in children. While this drug may be prescribed for children as young as 6 months for selected conditions, precautions do apply. Overdosage: If you think you have taken too much of this medicine contact a poison control center or emergency room at once. NOTE: This medicine is only for you. Do not share this medicine with others. What if I miss a dose? It is important not to miss your dose. Call your doctor or health care professional if you are unable to keep an appointment. What may interact with this medication? Interactions have not been studied. This list may not describe all possible interactions. Give your health care provider a list of all the medicines, herbs, non-prescription drugs, or dietary supplements you use.  Also tell them if you smoke, drink alcohol, or use illegal drugs. Some items may interact with your medicine. What should I watch for while using this medication? Your condition will be monitored carefully while you are receiving this medicine. You may need blood work done while you are taking this medicine. Do not become pregnant while taking this medicine or for 4 months after stopping it. Women should inform their doctor if they wish to become pregnant or think they might be pregnant. There is a potential for serious side effects to an unborn child. Talk to your health care professional or pharmacist for more information. Do not breast-feed an infant while taking this medicine or for 4 months after the last dose. What side effects may I notice from receiving this medication? Side effects that you should report to your doctor or health care professional as soon as possible: allergic reactions like skin rash, itching or hives, swelling of the face, lips, or tongue bloody or black, tarry breathing problems changes in vision chest pain chills confusion constipation cough diarrhea dizziness or feeling faint or lightheaded fast or irregular heartbeat fever flushing joint pain low blood counts - this medicine may decrease the number of white blood cells, red blood cells and platelets. You may be at increased risk for infections and bleeding. muscle pain muscle weakness pain, tingling, numbness in the hands or feet persistent headache redness, blistering, peeling or loosening of the skin, including inside the mouth signs and symptoms of high blood sugar such as dizziness; dry mouth; dry skin; fruity breath; nausea; stomach pain; increased hunger or thirst; increased urination signs and symptoms of kidney injury like trouble  passing urine or change in the amount of urine signs and symptoms of liver injury like dark urine, light-colored stools, loss of appetite, nausea, right upper belly pain,  yellowing of the eyes or skin sweating swollen lymph nodes weight loss Side effects that usually do not require medical attention (report to your doctor or health care professional if they continue or are bothersome): decreased appetite hair loss tiredness This list may not describe all possible side effects. Call your doctor for medical advice about side effects. You may report side effects to FDA at 1-800-FDA-1088. Where should I keep my medication? This drug is given in a hospital or clinic and will not be stored at home. NOTE: This sheet is a summary. It may not cover all possible information. If you have questions about this medicine, talk to your doctor, pharmacist, or health care provider.  2022 Elsevier/Gold Standard (2021-03-01 00:00:00)

## 2021-09-09 ENCOUNTER — Encounter: Payer: Self-pay | Admitting: Hematology and Oncology

## 2021-09-09 ENCOUNTER — Inpatient Hospital Stay: Payer: Medicare Other | Attending: Oncology | Admitting: Hematology and Oncology

## 2021-09-09 ENCOUNTER — Other Ambulatory Visit: Payer: Self-pay

## 2021-09-09 ENCOUNTER — Inpatient Hospital Stay: Payer: Medicare Other

## 2021-09-09 DIAGNOSIS — Z79899 Other long term (current) drug therapy: Secondary | ICD-10-CM | POA: Insufficient documentation

## 2021-09-09 DIAGNOSIS — C3431 Malignant neoplasm of lower lobe, right bronchus or lung: Secondary | ICD-10-CM | POA: Insufficient documentation

## 2021-09-09 DIAGNOSIS — Z5112 Encounter for antineoplastic immunotherapy: Secondary | ICD-10-CM | POA: Insufficient documentation

## 2021-09-09 DIAGNOSIS — M25511 Pain in right shoulder: Secondary | ICD-10-CM

## 2021-09-09 DIAGNOSIS — C7931 Secondary malignant neoplasm of brain: Secondary | ICD-10-CM | POA: Diagnosis not present

## 2021-09-09 LAB — HEPATIC FUNCTION PANEL
ALT: 21 U/L (ref 7–35)
AST: 25 (ref 13–35)
Alkaline Phosphatase: 109 (ref 25–125)
Bilirubin, Total: 1

## 2021-09-09 LAB — COMPREHENSIVE METABOLIC PANEL
Albumin: 4.1 (ref 3.5–5.0)
Calcium: 9.4 (ref 8.7–10.7)

## 2021-09-09 LAB — BASIC METABOLIC PANEL
BUN: 17 (ref 4–21)
CO2: 26 — AB (ref 13–22)
Chloride: 102 (ref 99–108)
Creatinine: 0.9 (ref 0.5–1.1)
Glucose: 255
Potassium: 3.5 mEq/L (ref 3.5–5.1)
Sodium: 137 (ref 137–147)

## 2021-09-09 LAB — CBC AND DIFFERENTIAL
HCT: 39 (ref 36–46)
Hemoglobin: 12.3 (ref 12.0–16.0)
Neutrophils Absolute: 6.07
Platelets: 249 10*3/uL (ref 150–400)
WBC: 8.2

## 2021-09-09 LAB — CBC: RBC: 4.6 (ref 3.87–5.11)

## 2021-09-09 LAB — TSH: TSH: 1.382 u[IU]/mL (ref 0.350–4.500)

## 2021-09-09 MED ORDER — OXYCODONE HCL 10 MG PO TABS: 10.0000 mg | ORAL_TABLET | ORAL | 0 refills | Status: DC | PRN

## 2021-09-09 NOTE — Assessment & Plan Note (Signed)
Worsening right shoulder pain. 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 Right shoulder x-ray revealed diffusely decreased mineralization of the bones. No acute fracture or dislocation. Hypertrophic degenerative changes present at the acromioclavicular joint. Mild degenerative changes at the glenohumeral joint. Sternotomy wires are noted over the midline. The soft tissues are within normal limits.I will increase her oxycodone today to 10 mg as she states the 5 mg is not relieving her pain. ? ?

## 2021-09-09 NOTE — Assessment & Plan Note (Addendum)
She received 1 dose of chemotherapy in June 2022, with?combination cisplatin/Taxol/pembrolizumab, and tolerated this very poorly?and ended up in the hospital?twice. She is now receiving palliative pembrolizumab since October, and tolerating well. CT chest from January 2023 is stable to mildly improved. We will plan to rescan her after another 3 months. She is tolerating pembrolizumab well. She will proceed with cycle 7 Monday. She did hurt her back trying to move her couch last week. I will refill her pain medication and advised not to engage in any heavy lifting. She will return to clinic in 3 weeks for repeat evaluation. ?

## 2021-09-09 NOTE — Progress Notes (Signed)
?Patient Care Team: ?Townsend Roger, MD as PCP - General (Internal Medicine) ?Sharmon Revere as Physician Assistant (Cardiology) ? ?Clinic Day:  09/09/2021 ? ?Referring physician: Townsend Roger, MD ? ?ASSESSMENT & PLAN:  ? ?Assessment & Plan: ?Lung cancer, lower lobe ([place]) ?She received 1 dose of chemotherapy in June 2022, with combination cisplatin/Taxol/pembrolizumab, and tolerated this very poorly and ended up in the hospital twice. She is now receiving palliative pembrolizumab since October, and tolerating well. CT chest from January 2023 is stable to mildly improved. We will plan to rescan her after another 3 months. She is tolerating pembrolizumab well. She will proceed with cycle 7 Monday. She did hurt her back trying to move her couch last week. I will refill her pain medication and advised not to engage in any heavy lifting. She will return to clinic in 3 weeks for repeat evaluation. ? ?Brain metastases ([place]) ?Brain metastases diagnosed in April 2022.  This presented with right sided head pain and pain of the right eye.  She received stereotactic radiation to the two brain lesions. MRI brain from September 26th revealed four metastatic deposits in the brain. Several show mild associated hemorrhage. Mild edema. No mass-effect or midline shift. We will continue to monitor these. MRI brain from January revealed a mixed response with one new enhancing lesion in the right cerebellar hemisphere measuring 3 mm. One lesion in the left frontal lobe has resolved, one lesion in the right occipital lobe is stable, and two lesions are mildly decreased in size. We will rescan in early May. ? ?Shoulder pain, right ?Worsening right shoulder pain. 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 Right shoulder x-ray revealed diffusely decreased mineralization of the bones. No acute fracture or dislocation. Hypertrophic degenerative changes present at the acromioclavicular joint. Mild  degenerative changes at the glenohumeral joint. Sternotomy wires are noted over the midline. The soft tissues are within normal limits.I will increase her oxycodone today to 10 mg as she states the 5 mg is not relieving her pain.   ?  ? ?The patient understands the plans discussed today and is in agreement with them.  She knows to contact our office if she develops concerns prior to her next appointment. ? ? ? ?Raven Ped, NP  ?[place] [address] ?[place] [address] ?Dept: [phone] ?Dept Fax: [phone]  ? ?No orders of the defined types were placed in this encounter. ?  ? ? ?CHIEF COMPLAINT:  ?CC: A 76 year old female with history of lung cancer here for 3 week evaluation ? ?Current Treatment:  Pembrolizumab ? ?INTERVAL HISTORY:  ?[name] is here today for repeat clinical assessment. She denies fevers or chills. She denies pain. Her appetite is good. Her weight has decreased 4 pounds over last 3 weeks . ? ?I have reviewed the past medical history, past surgical history, social history and family history with the patient and they are unchanged from previous note. ? ?ALLERGIES:  is allergic to ergocalciferol. ? ?MEDICATIONS:  ?Current Outpatient Medications  ?Medication Sig Dispense Refill  ? ALPRAZolam (XANAX) 0.5 MG tablet Take 1 tablet (0.5 mg total) by mouth 3 (three) times daily as needed for anxiety. 90 tablet 0  ? atorvastatin (LIPITOR) 80 MG tablet Take 80 mg by mouth daily.    ? brimonidine (ALPHAGAN) 0.2 % ophthalmic solution in the morning and at bedtime. (Patient not taking: Reported on 04/18/2021)    ?  brimonidine-timolol (COMBIGAN) 0.2-0.5 % ophthalmic solution Place 1 drop into the right eye 2 (two) times daily. (Patient not taking: Reported on 04/18/2021)    ? dexamethasone (DECADRON) 4 MG tablet Take 4 mg by mouth daily as needed.    ? gabapentin (NEURONTIN) 100 MG capsule Take by mouth.    ? glipiZIDE (GLUCOTROL  XL) 5 MG 24 hr tablet Take 5 mg by mouth daily.    ? HUMULIN R 100 UNIT/ML injection     ? JANUVIA 100 MG tablet Take 100 mg by mouth daily.    ? LORazepam (ATIVAN) 0.5 MG tablet Take 1 tablet (0.5 mg total) by mouth at bedtime. 30 tablet 0  ? losartan (COZAAR) 25 MG tablet Take 25 mg by mouth daily.    ? metoprolol tartrate (LOPRESSOR) 50 MG tablet Take 50 mg by mouth as needed.    ? mupirocin cream (BACTROBAN) 2 % Apply topically.    ? ofloxacin (OCUFLOX) 0.3 % ophthalmic solution Place into the left eye. (Patient not taking: Reported on 04/18/2021)    ? ondansetron (ZOFRAN) 4 MG tablet Take 1 tablet (4 mg total) by mouth every 4 (four) hours as needed for nausea. 90 tablet 3  ? Oxycodone HCl 10 MG TABS Take 1 tablet (10 mg total) by mouth every 4 (four) hours as needed. 180 tablet 0  ? prednisoLONE acetate (PRED FORTE) 1 % ophthalmic suspension Place into the left eye.    ? prochlorperazine (COMPAZINE) 10 MG tablet Take 1 tablet (10 mg total) by mouth every 6 (six) hours as needed for nausea or vomiting. 90 tablet 3  ? rivaroxaban (XARELTO) 20 MG TABS tablet Take 20 mg by mouth daily with supper.    ? ?No current facility-administered medications for this visit.  ? ? ?HISTORY OF PRESENT ILLNESS:  ? ?Oncology History  ?Lung cancer, lower lobe ([place])  ?07/07/2020 Initial Diagnosis  ? Lung cancer, lower lobe ([place]) ?  ?10/15/2020 Cancer Staging  ? Staging form: Lung, AJCC 8th Edition ?- Clinical stage from 10/15/2020: Stage IV (cT1b, cN0, cM1) - Signed by Derwood Kaplan, MD on 03/31/2021 ?Histopathologic type: Squamous cell carcinoma, NOS ?Stage prefix: Initial diagnosis ?Histologic grade (G): G3 ?Histologic grading system: 4 grade system ?Laterality: Right ?Tumor size (mm): 18 ?Sites of metastasis: Brain ?Lymph-vascular invasion (LVI): Presence of LVI unknown/indeterminate ?Diagnostic confirmation: Positive histology ?Specimen type: Core Needle Biopsy ?Staged by: Managing physician ?Type of lung cancer: Locally  advanced or metastatic non-small cell lung cancer ?Stage used in treatment planning: Yes ?National guidelines used in treatment planning: Yes ?Type of national guideline used in treatment planning: NCCN ? ?  ?04/08/2021 -  Chemotherapy  ? Patient is on Treatment Plan : LUNG NSCLC flat dose Pembrolizumab Q21D  ?   ?  ? ? ?REVIEW OF SYSTEMS:  ? ?Constitutional: Denies fevers, chills or abnormal weight loss ?Eyes: Denies blurriness of vision ?Ears, nose, mouth, throat, and face: Denies mucositis or sore throat ?Respiratory: Denies cough, dyspnea or wheezes ?Cardiovascular: Denies palpitation, chest discomfort or lower extremity swelling ?Gastrointestinal:  Denies nausea, heartburn or change in bowel habits ?Skin: Denies abnormal skin rashes ?Lymphatics: Denies new lymphadenopathy or easy bruising ?Neurological:Denies numbness, tingling or new weaknesses ?Behavioral/Psych: Mood is stable, no new changes  ?All other systems were reviewed with the patient and are negative. ? ? ?VITALS:  ?Blood pressure 139/65, pulse 84, temperature 97.9 ?F (36.6 ?C), temperature source Oral, resp. rate 20, height 5\' 5"  (1.651 m), weight 123 lb 12.8 oz (56.2 kg),  SpO2 98 %.  ?Wt Readings from Last 3 Encounters:  ?09/09/21 123 lb 12.8 oz (56.2 kg)  ?08/22/21 127 lb (57.6 kg)  ?08/19/21 129 lb 12.8 oz (58.9 kg)  ?  ?Body mass index is 20.6 kg/m?. ? ?Performance status (ECOG): 1 - Symptomatic but completely ambulatory ? ?PHYSICAL EXAM:  ? ?GENERAL:alert, no distress and comfortable ?SKIN: skin color, texture, turgor are normal, no rashes or significant lesions ?EYES: normal, Conjunctiva are pink and non-injected, sclera clear ?OROPHARYNX:no exudate, no erythema and lips, buccal mucosa, and tongue normal  ?NECK: supple, thyroid normal size, non-tender, without nodularity ?LYMPH:  no palpable lymphadenopathy in the cervical, axillary or inguinal ?LUNGS: clear to auscultation and percussion with normal breathing effort ?HEART: regular rate &  rhythm and no murmurs and no lower extremity edema ?ABDOMEN:abdomen soft, non-tender and normal bowel sounds ?Musculoskeletal:no cyanosis of digits and no clubbing  ?NEURO: alert & oriented x 3 with fluent spe

## 2021-09-09 NOTE — Assessment & Plan Note (Signed)
Brain metastases diagnosed in April 2022. ?This presented with right sided head pain and pain of the right eye. ?She received stereotactic radiation to the two brain lesions. MRI brain from?September 26th revealed four metastatic deposits in the brain. Several show mild associated hemorrhage. Mild edema. No mass-effect or midline shift. We will continue to monitor these. MRI brain from January revealed a mixed response with one new enhancing lesion in the right cerebellar hemisphere measuring 3 mm. One lesion in the left frontal lobe has resolved, one lesion in the right occipital lobe is stable, and two lesions are mildly decreased in size. We will rescan in early May. ?

## 2021-09-10 LAB — T4: T4, Total: 8.7 ug/dL (ref 4.5–12.0)

## 2021-09-12 ENCOUNTER — Other Ambulatory Visit: Payer: Self-pay

## 2021-09-12 ENCOUNTER — Inpatient Hospital Stay: Payer: Medicare Other

## 2021-09-12 VITALS — BP 164/67 | HR 79 | Temp 98.3°F | Resp 18 | Ht 65.0 in | Wt 119.0 lb

## 2021-09-12 DIAGNOSIS — I10 Essential (primary) hypertension: Secondary | ICD-10-CM | POA: Diagnosis not present

## 2021-09-12 DIAGNOSIS — E1140 Type 2 diabetes mellitus with diabetic neuropathy, unspecified: Secondary | ICD-10-CM | POA: Diagnosis not present

## 2021-09-12 DIAGNOSIS — C7931 Secondary malignant neoplasm of brain: Secondary | ICD-10-CM | POA: Diagnosis not present

## 2021-09-12 DIAGNOSIS — C3431 Malignant neoplasm of lower lobe, right bronchus or lung: Secondary | ICD-10-CM

## 2021-09-12 DIAGNOSIS — Z79899 Other long term (current) drug therapy: Secondary | ICD-10-CM | POA: Diagnosis not present

## 2021-09-12 DIAGNOSIS — E1121 Type 2 diabetes mellitus with diabetic nephropathy: Secondary | ICD-10-CM | POA: Diagnosis not present

## 2021-09-12 DIAGNOSIS — C3490 Malignant neoplasm of unspecified part of unspecified bronchus or lung: Secondary | ICD-10-CM | POA: Diagnosis not present

## 2021-09-12 DIAGNOSIS — N1831 Chronic kidney disease, stage 3a: Secondary | ICD-10-CM | POA: Diagnosis not present

## 2021-09-12 DIAGNOSIS — Z5112 Encounter for antineoplastic immunotherapy: Secondary | ICD-10-CM | POA: Diagnosis not present

## 2021-09-12 MED ORDER — SODIUM CHLORIDE 0.9% FLUSH: 10.0000 mL | INTRAVENOUS | Status: DC | PRN

## 2021-09-12 MED ORDER — SODIUM CHLORIDE 0.9 % IV SOLN
200.0000 mg | Freq: Once | INTRAVENOUS | Status: AC
  Administered 2021-09-12: 200 mg via INTRAVENOUS
  Filled 2021-09-12: qty 8

## 2021-09-12 MED ORDER — HEPARIN SOD (PORK) LOCK FLUSH 100 UNIT/ML IV SOLN: 500.0000 [IU] | Freq: Once | INTRAVENOUS | Status: DC | PRN

## 2021-09-12 MED ORDER — SODIUM CHLORIDE 0.9 % IV SOLN: Freq: Once | INTRAVENOUS | Status: AC

## 2021-09-12 NOTE — Patient Instructions (Signed)
Pembrolizumab injection ?What is this medication? ?PEMBROLIZUMAB (pem broe liz ue mab) is a monoclonal antibody. It is used to treat certain types of cancer. ?This medicine may be used for other purposes; ask your health care provider or pharmacist if you have questions. ?COMMON BRAND NAME(S): Keytruda ?What should I tell my care team before I take this medication? ?They need to know if you have any of these conditions: ?autoimmune diseases like Crohn's disease, ulcerative colitis, or lupus ?have had or planning to have an allogeneic stem cell transplant (uses someone else's stem cells) ?history of organ transplant ?history of chest radiation ?nervous system problems like myasthenia gravis or Guillain-Barre syndrome ?an unusual or allergic reaction to pembrolizumab, other medicines, foods, dyes, or preservatives ?pregnant or trying to get pregnant ?breast-feeding ?How should I use this medication? ?This medicine is for infusion into a vein. It is given by a health care professional in a hospital or clinic setting. ?A special MedGuide will be given to you before each treatment. Be sure to read this information carefully each time. ?Talk to your pediatrician regarding the use of this medicine in children. While this drug may be prescribed for children as young as 6 months for selected conditions, precautions do apply. ?Overdosage: If you think you have taken too much of this medicine contact a poison control center or emergency room at once. ?NOTE: This medicine is only for you. Do not share this medicine with others. ?What if I miss a dose? ?It is important not to miss your dose. Call your doctor or health care professional if you are unable to keep an appointment. ?What may interact with this medication? ?Interactions have not been studied. ?This list may not describe all possible interactions. Give your health care provider a list of all the medicines, herbs, non-prescription drugs, or dietary supplements you use.  Also tell them if you smoke, drink alcohol, or use illegal drugs. Some items may interact with your medicine. ?What should I watch for while using this medication? ?Your condition will be monitored carefully while you are receiving this medicine. ?You may need blood work done while you are taking this medicine. ?Do not become pregnant while taking this medicine or for 4 months after stopping it. Women should inform their doctor if they wish to become pregnant or think they might be pregnant. There is a potential for serious side effects to an unborn child. Talk to your health care professional or pharmacist for more information. Do not breast-feed an infant while taking this medicine or for 4 months after the last dose. ?What side effects may I notice from receiving this medication? ?Side effects that you should report to your doctor or health care professional as soon as possible: ?allergic reactions like skin rash, itching or hives, swelling of the face, lips, or tongue ?bloody or black, tarry ?breathing problems ?changes in vision ?chest pain ?chills ?confusion ?constipation ?cough ?diarrhea ?dizziness or feeling faint or lightheaded ?fast or irregular heartbeat ?fever ?flushing ?joint pain ?low blood counts - this medicine may decrease the number of white blood cells, red blood cells and platelets. You may be at increased risk for infections and bleeding. ?muscle pain ?muscle weakness ?pain, tingling, numbness in the hands or feet ?persistent headache ?redness, blistering, peeling or loosening of the skin, including inside the mouth ?signs and symptoms of high blood sugar such as dizziness; dry mouth; dry skin; fruity breath; nausea; stomach pain; increased hunger or thirst; increased urination ?signs and symptoms of kidney injury like trouble  passing urine or change in the amount of urine ?signs and symptoms of liver injury like dark urine, light-colored stools, loss of appetite, nausea, right upper belly pain,  yellowing of the eyes or skin ?sweating ?swollen lymph nodes ?weight loss ?Side effects that usually do not require medical attention (report to your doctor or health care professional if they continue or are bothersome): ?decreased appetite ?hair loss ?tiredness ?This list may not describe all possible side effects. Call your doctor for medical advice about side effects. You may report side effects to FDA at 1-800-FDA-1088. ?Where should I keep my medication? ?This drug is given in a hospital or clinic and will not be stored at home. ?NOTE: This sheet is a summary. It may not cover all possible information. If you have questions about this medicine, talk to your doctor, pharmacist, or health care provider. ?? 2022 Elsevier/Gold Standard (2021-03-01 00:00:00) ? ?

## 2021-09-13 ENCOUNTER — Telehealth: Payer: Self-pay | Admitting: Hematology and Oncology

## 2021-09-13 NOTE — Telephone Encounter (Signed)
Patient's came in to Reschedule 4/7 to 4/14 Labs, Follow Up - 4/10 Inf to 4/17 ?

## 2021-09-19 ENCOUNTER — Encounter: Payer: Self-pay | Admitting: Gastroenterology

## 2021-09-26 ENCOUNTER — Encounter: Payer: Self-pay | Admitting: Oncology

## 2021-09-28 DIAGNOSIS — Z7984 Long term (current) use of oral hypoglycemic drugs: Secondary | ICD-10-CM | POA: Diagnosis not present

## 2021-09-28 DIAGNOSIS — D500 Iron deficiency anemia secondary to blood loss (chronic): Secondary | ICD-10-CM | POA: Diagnosis not present

## 2021-09-28 DIAGNOSIS — W19XXXA Unspecified fall, initial encounter: Secondary | ICD-10-CM | POA: Diagnosis not present

## 2021-09-28 DIAGNOSIS — R2689 Other abnormalities of gait and mobility: Secondary | ICD-10-CM | POA: Diagnosis not present

## 2021-09-28 DIAGNOSIS — I10 Essential (primary) hypertension: Secondary | ICD-10-CM | POA: Diagnosis not present

## 2021-09-28 DIAGNOSIS — S79912A Unspecified injury of left hip, initial encounter: Secondary | ICD-10-CM | POA: Diagnosis not present

## 2021-09-28 DIAGNOSIS — S72009A Fracture of unspecified part of neck of unspecified femur, initial encounter for closed fracture: Secondary | ICD-10-CM | POA: Diagnosis not present

## 2021-09-28 DIAGNOSIS — I252 Old myocardial infarction: Secondary | ICD-10-CM | POA: Diagnosis not present

## 2021-09-28 DIAGNOSIS — I340 Nonrheumatic mitral (valve) insufficiency: Secondary | ICD-10-CM | POA: Diagnosis not present

## 2021-09-28 DIAGNOSIS — F17200 Nicotine dependence, unspecified, uncomplicated: Secondary | ICD-10-CM | POA: Diagnosis not present

## 2021-09-28 DIAGNOSIS — Z85841 Personal history of malignant neoplasm of brain: Secondary | ICD-10-CM | POA: Diagnosis not present

## 2021-09-28 DIAGNOSIS — I361 Nonrheumatic tricuspid (valve) insufficiency: Secondary | ICD-10-CM | POA: Diagnosis not present

## 2021-09-28 DIAGNOSIS — R791 Abnormal coagulation profile: Secondary | ICD-10-CM | POA: Diagnosis not present

## 2021-09-28 DIAGNOSIS — I517 Cardiomegaly: Secondary | ICD-10-CM | POA: Diagnosis not present

## 2021-09-28 DIAGNOSIS — D62 Acute posthemorrhagic anemia: Secondary | ICD-10-CM | POA: Diagnosis not present

## 2021-09-28 DIAGNOSIS — Z951 Presence of aortocoronary bypass graft: Secondary | ICD-10-CM | POA: Diagnosis not present

## 2021-09-28 DIAGNOSIS — S72002D Fracture of unspecified part of neck of left femur, subsequent encounter for closed fracture with routine healing: Secondary | ICD-10-CM | POA: Diagnosis not present

## 2021-09-28 DIAGNOSIS — Y92019 Unspecified place in single-family (private) house as the place of occurrence of the external cause: Secondary | ICD-10-CM | POA: Diagnosis not present

## 2021-09-28 DIAGNOSIS — I272 Pulmonary hypertension, unspecified: Secondary | ICD-10-CM | POA: Diagnosis not present

## 2021-09-28 DIAGNOSIS — R918 Other nonspecific abnormal finding of lung field: Secondary | ICD-10-CM | POA: Diagnosis not present

## 2021-09-28 DIAGNOSIS — J9811 Atelectasis: Secondary | ICD-10-CM | POA: Diagnosis not present

## 2021-09-28 DIAGNOSIS — D649 Anemia, unspecified: Secondary | ICD-10-CM | POA: Diagnosis not present

## 2021-09-28 DIAGNOSIS — E785 Hyperlipidemia, unspecified: Secondary | ICD-10-CM | POA: Diagnosis not present

## 2021-09-28 DIAGNOSIS — J9601 Acute respiratory failure with hypoxia: Secondary | ICD-10-CM | POA: Diagnosis not present

## 2021-09-28 DIAGNOSIS — R279 Unspecified lack of coordination: Secondary | ICD-10-CM | POA: Diagnosis not present

## 2021-09-28 DIAGNOSIS — R0602 Shortness of breath: Secondary | ICD-10-CM | POA: Diagnosis not present

## 2021-09-28 DIAGNOSIS — E8809 Other disorders of plasma-protein metabolism, not elsewhere classified: Secondary | ICD-10-CM | POA: Diagnosis not present

## 2021-09-28 DIAGNOSIS — R0902 Hypoxemia: Secondary | ICD-10-CM | POA: Diagnosis not present

## 2021-09-28 DIAGNOSIS — J449 Chronic obstructive pulmonary disease, unspecified: Secondary | ICD-10-CM | POA: Diagnosis not present

## 2021-09-28 DIAGNOSIS — Z7982 Long term (current) use of aspirin: Secondary | ICD-10-CM | POA: Diagnosis not present

## 2021-09-28 DIAGNOSIS — I4891 Unspecified atrial fibrillation: Secondary | ICD-10-CM | POA: Diagnosis not present

## 2021-09-28 DIAGNOSIS — E860 Dehydration: Secondary | ICD-10-CM | POA: Diagnosis not present

## 2021-09-28 DIAGNOSIS — Z01810 Encounter for preprocedural cardiovascular examination: Secondary | ICD-10-CM | POA: Diagnosis not present

## 2021-09-28 DIAGNOSIS — I2699 Other pulmonary embolism without acute cor pulmonale: Secondary | ICD-10-CM | POA: Diagnosis not present

## 2021-09-28 DIAGNOSIS — E119 Type 2 diabetes mellitus without complications: Secondary | ICD-10-CM | POA: Diagnosis not present

## 2021-09-28 DIAGNOSIS — Z85118 Personal history of other malignant neoplasm of bronchus and lung: Secondary | ICD-10-CM | POA: Diagnosis not present

## 2021-09-28 DIAGNOSIS — Z9181 History of falling: Secondary | ICD-10-CM | POA: Diagnosis not present

## 2021-09-28 DIAGNOSIS — Z794 Long term (current) use of insulin: Secondary | ICD-10-CM | POA: Diagnosis not present

## 2021-09-28 DIAGNOSIS — M810 Age-related osteoporosis without current pathological fracture: Secondary | ICD-10-CM | POA: Diagnosis not present

## 2021-09-28 DIAGNOSIS — M6281 Muscle weakness (generalized): Secondary | ICD-10-CM | POA: Diagnosis not present

## 2021-09-28 DIAGNOSIS — I2510 Atherosclerotic heart disease of native coronary artery without angina pectoris: Secondary | ICD-10-CM | POA: Diagnosis not present

## 2021-09-28 DIAGNOSIS — C3490 Malignant neoplasm of unspecified part of unspecified bronchus or lung: Secondary | ICD-10-CM | POA: Diagnosis not present

## 2021-09-28 DIAGNOSIS — I480 Paroxysmal atrial fibrillation: Secondary | ICD-10-CM | POA: Diagnosis not present

## 2021-09-28 DIAGNOSIS — Z0489 Encounter for examination and observation for other specified reasons: Secondary | ICD-10-CM | POA: Diagnosis not present

## 2021-09-28 DIAGNOSIS — E1165 Type 2 diabetes mellitus with hyperglycemia: Secondary | ICD-10-CM | POA: Diagnosis not present

## 2021-09-28 DIAGNOSIS — Z8673 Personal history of transient ischemic attack (TIA), and cerebral infarction without residual deficits: Secondary | ICD-10-CM | POA: Diagnosis not present

## 2021-09-28 DIAGNOSIS — J9691 Respiratory failure, unspecified with hypoxia: Secondary | ICD-10-CM | POA: Diagnosis not present

## 2021-09-28 DIAGNOSIS — C7931 Secondary malignant neoplasm of brain: Secondary | ICD-10-CM | POA: Diagnosis not present

## 2021-09-28 DIAGNOSIS — M5030 Other cervical disc degeneration, unspecified cervical region: Secondary | ICD-10-CM | POA: Diagnosis not present

## 2021-09-28 DIAGNOSIS — Z4789 Encounter for other orthopedic aftercare: Secondary | ICD-10-CM | POA: Diagnosis not present

## 2021-09-28 DIAGNOSIS — G8911 Acute pain due to trauma: Secondary | ICD-10-CM | POA: Diagnosis not present

## 2021-09-28 DIAGNOSIS — S72142A Displaced intertrochanteric fracture of left femur, initial encounter for closed fracture: Secondary | ICD-10-CM | POA: Diagnosis not present

## 2021-09-28 DIAGNOSIS — S72141A Displaced intertrochanteric fracture of right femur, initial encounter for closed fracture: Secondary | ICD-10-CM | POA: Diagnosis not present

## 2021-09-30 ENCOUNTER — Ambulatory Visit: Payer: Medicare Other | Admitting: Hematology and Oncology

## 2021-09-30 ENCOUNTER — Other Ambulatory Visit: Payer: Medicare Other

## 2021-10-03 ENCOUNTER — Ambulatory Visit: Payer: Medicare Other

## 2021-10-03 ENCOUNTER — Other Ambulatory Visit: Payer: Medicare Other

## 2021-10-03 ENCOUNTER — Ambulatory Visit: Payer: Medicare Other | Admitting: Hematology and Oncology

## 2021-10-03 DIAGNOSIS — S72141A Displaced intertrochanteric fracture of right femur, initial encounter for closed fracture: Secondary | ICD-10-CM | POA: Diagnosis not present

## 2021-10-03 DIAGNOSIS — I10 Essential (primary) hypertension: Secondary | ICD-10-CM | POA: Diagnosis not present

## 2021-10-03 DIAGNOSIS — E119 Type 2 diabetes mellitus without complications: Secondary | ICD-10-CM | POA: Diagnosis not present

## 2021-10-03 DIAGNOSIS — J449 Chronic obstructive pulmonary disease, unspecified: Secondary | ICD-10-CM | POA: Diagnosis not present

## 2021-10-07 ENCOUNTER — Telehealth: Payer: Self-pay | Admitting: Oncology

## 2021-10-07 ENCOUNTER — Other Ambulatory Visit: Payer: Self-pay | Admitting: Pharmacist

## 2021-10-07 ENCOUNTER — Other Ambulatory Visit: Payer: Medicare Other

## 2021-10-07 ENCOUNTER — Ambulatory Visit: Payer: Medicare Other | Admitting: Hematology and Oncology

## 2021-10-07 MED FILL — Pembrolizumab IV Soln 100 MG/4ML (25 MG/ML): INTRAVENOUS | Qty: 8 | Status: AC

## 2021-10-07 NOTE — Telephone Encounter (Signed)
Patient has been scheduled. Daughter is aware of appt date and time  ? ?Scheduling Message ?Entered by Ulice Dash M on 10/07/2021 at 12:35 PM ?Priority: Routine ?<No visit type provided>  ?Department: [facility]  ?Provider:   ?Appointment Notes:  ?Please contact patient's daughter and schedule [name] for f/u and labs with Dr. Hinton Rao in about 3 weeks.  She recently broke her hip and will be in rehab for a while (Traveling from [place] to [place] on 4/17 to rehab here in town).  Dr. Hinton Rao thinks that  ?we won't be able to give the keytruda while in rehab, but Dr. Hinton Rao wants to see her in a few weeks to assess her progress either way.  ?Scheduling Notes:  ? ?

## 2021-10-10 ENCOUNTER — Ambulatory Visit: Payer: Medicare Other

## 2021-10-10 DIAGNOSIS — M6281 Muscle weakness (generalized): Secondary | ICD-10-CM | POA: Diagnosis not present

## 2021-10-10 DIAGNOSIS — E1165 Type 2 diabetes mellitus with hyperglycemia: Secondary | ICD-10-CM | POA: Diagnosis not present

## 2021-10-10 DIAGNOSIS — I10 Essential (primary) hypertension: Secondary | ICD-10-CM | POA: Diagnosis not present

## 2021-10-10 DIAGNOSIS — Z8673 Personal history of transient ischemic attack (TIA), and cerebral infarction without residual deficits: Secondary | ICD-10-CM | POA: Diagnosis not present

## 2021-10-10 DIAGNOSIS — I2510 Atherosclerotic heart disease of native coronary artery without angina pectoris: Secondary | ICD-10-CM | POA: Diagnosis not present

## 2021-10-10 DIAGNOSIS — E119 Type 2 diabetes mellitus without complications: Secondary | ICD-10-CM | POA: Diagnosis not present

## 2021-10-10 DIAGNOSIS — J9601 Acute respiratory failure with hypoxia: Secondary | ICD-10-CM | POA: Diagnosis not present

## 2021-10-10 DIAGNOSIS — E785 Hyperlipidemia, unspecified: Secondary | ICD-10-CM | POA: Diagnosis not present

## 2021-10-10 DIAGNOSIS — D649 Anemia, unspecified: Secondary | ICD-10-CM | POA: Diagnosis not present

## 2021-10-10 DIAGNOSIS — C3490 Malignant neoplasm of unspecified part of unspecified bronchus or lung: Secondary | ICD-10-CM | POA: Diagnosis not present

## 2021-10-10 DIAGNOSIS — D500 Iron deficiency anemia secondary to blood loss (chronic): Secondary | ICD-10-CM | POA: Diagnosis not present

## 2021-10-10 DIAGNOSIS — I252 Old myocardial infarction: Secondary | ICD-10-CM | POA: Diagnosis not present

## 2021-10-10 DIAGNOSIS — R2689 Other abnormalities of gait and mobility: Secondary | ICD-10-CM | POA: Diagnosis not present

## 2021-10-10 DIAGNOSIS — C7931 Secondary malignant neoplasm of brain: Secondary | ICD-10-CM | POA: Diagnosis not present

## 2021-10-10 DIAGNOSIS — J9811 Atelectasis: Secondary | ICD-10-CM | POA: Diagnosis not present

## 2021-10-10 DIAGNOSIS — M810 Age-related osteoporosis without current pathological fracture: Secondary | ICD-10-CM | POA: Diagnosis not present

## 2021-10-10 DIAGNOSIS — Z9181 History of falling: Secondary | ICD-10-CM | POA: Diagnosis not present

## 2021-10-10 DIAGNOSIS — S72142A Displaced intertrochanteric fracture of left femur, initial encounter for closed fracture: Secondary | ICD-10-CM | POA: Diagnosis not present

## 2021-10-10 DIAGNOSIS — I361 Nonrheumatic tricuspid (valve) insufficiency: Secondary | ICD-10-CM | POA: Diagnosis not present

## 2021-10-10 DIAGNOSIS — I480 Paroxysmal atrial fibrillation: Secondary | ICD-10-CM | POA: Diagnosis not present

## 2021-10-10 DIAGNOSIS — G8918 Other acute postprocedural pain: Secondary | ICD-10-CM | POA: Diagnosis not present

## 2021-10-10 DIAGNOSIS — R279 Unspecified lack of coordination: Secondary | ICD-10-CM | POA: Diagnosis not present

## 2021-10-10 DIAGNOSIS — D62 Acute posthemorrhagic anemia: Secondary | ICD-10-CM | POA: Diagnosis not present

## 2021-10-10 DIAGNOSIS — R262 Difficulty in walking, not elsewhere classified: Secondary | ICD-10-CM | POA: Diagnosis not present

## 2021-10-10 DIAGNOSIS — S72002D Fracture of unspecified part of neck of left femur, subsequent encounter for closed fracture with routine healing: Secondary | ICD-10-CM | POA: Diagnosis not present

## 2021-10-11 DIAGNOSIS — E119 Type 2 diabetes mellitus without complications: Secondary | ICD-10-CM | POA: Diagnosis not present

## 2021-10-11 DIAGNOSIS — I10 Essential (primary) hypertension: Secondary | ICD-10-CM | POA: Diagnosis not present

## 2021-10-11 DIAGNOSIS — S72002D Fracture of unspecified part of neck of left femur, subsequent encounter for closed fracture with routine healing: Secondary | ICD-10-CM | POA: Diagnosis not present

## 2021-10-11 DIAGNOSIS — E785 Hyperlipidemia, unspecified: Secondary | ICD-10-CM | POA: Diagnosis not present

## 2021-10-13 DIAGNOSIS — S72002D Fracture of unspecified part of neck of left femur, subsequent encounter for closed fracture with routine healing: Secondary | ICD-10-CM | POA: Diagnosis not present

## 2021-10-13 DIAGNOSIS — D649 Anemia, unspecified: Secondary | ICD-10-CM | POA: Diagnosis not present

## 2021-10-13 DIAGNOSIS — G8918 Other acute postprocedural pain: Secondary | ICD-10-CM | POA: Diagnosis not present

## 2021-10-13 DIAGNOSIS — R262 Difficulty in walking, not elsewhere classified: Secondary | ICD-10-CM | POA: Diagnosis not present

## 2021-10-20 DIAGNOSIS — S72142A Displaced intertrochanteric fracture of left femur, initial encounter for closed fracture: Secondary | ICD-10-CM | POA: Diagnosis not present

## 2021-10-25 DIAGNOSIS — I480 Paroxysmal atrial fibrillation: Secondary | ICD-10-CM | POA: Diagnosis not present

## 2021-10-25 DIAGNOSIS — I252 Old myocardial infarction: Secondary | ICD-10-CM | POA: Diagnosis not present

## 2021-10-25 DIAGNOSIS — Z8673 Personal history of transient ischemic attack (TIA), and cerebral infarction without residual deficits: Secondary | ICD-10-CM | POA: Diagnosis not present

## 2021-10-25 DIAGNOSIS — I10 Essential (primary) hypertension: Secondary | ICD-10-CM | POA: Diagnosis not present

## 2021-10-25 DIAGNOSIS — C7931 Secondary malignant neoplasm of brain: Secondary | ICD-10-CM | POA: Diagnosis not present

## 2021-10-25 DIAGNOSIS — I4892 Unspecified atrial flutter: Secondary | ICD-10-CM | POA: Diagnosis not present

## 2021-10-25 DIAGNOSIS — Z7984 Long term (current) use of oral hypoglycemic drugs: Secondary | ICD-10-CM | POA: Diagnosis not present

## 2021-10-25 DIAGNOSIS — E1165 Type 2 diabetes mellitus with hyperglycemia: Secondary | ICD-10-CM | POA: Diagnosis not present

## 2021-10-25 DIAGNOSIS — J9601 Acute respiratory failure with hypoxia: Secondary | ICD-10-CM | POA: Diagnosis not present

## 2021-10-25 DIAGNOSIS — F17210 Nicotine dependence, cigarettes, uncomplicated: Secondary | ICD-10-CM | POA: Diagnosis not present

## 2021-10-25 DIAGNOSIS — Z9049 Acquired absence of other specified parts of digestive tract: Secondary | ICD-10-CM | POA: Diagnosis not present

## 2021-10-25 DIAGNOSIS — M5020 Other cervical disc displacement, unspecified cervical region: Secondary | ICD-10-CM | POA: Diagnosis not present

## 2021-10-25 DIAGNOSIS — K5900 Constipation, unspecified: Secondary | ICD-10-CM | POA: Diagnosis not present

## 2021-10-25 DIAGNOSIS — E785 Hyperlipidemia, unspecified: Secondary | ICD-10-CM | POA: Diagnosis not present

## 2021-10-25 DIAGNOSIS — C3490 Malignant neoplasm of unspecified part of unspecified bronchus or lung: Secondary | ICD-10-CM | POA: Diagnosis not present

## 2021-10-25 DIAGNOSIS — Z79891 Long term (current) use of opiate analgesic: Secondary | ICD-10-CM | POA: Diagnosis not present

## 2021-10-25 DIAGNOSIS — I2510 Atherosclerotic heart disease of native coronary artery without angina pectoris: Secondary | ICD-10-CM | POA: Diagnosis not present

## 2021-10-25 DIAGNOSIS — Z9181 History of falling: Secondary | ICD-10-CM | POA: Diagnosis not present

## 2021-10-25 DIAGNOSIS — D630 Anemia in neoplastic disease: Secondary | ICD-10-CM | POA: Diagnosis not present

## 2021-10-25 DIAGNOSIS — Z7901 Long term (current) use of anticoagulants: Secondary | ICD-10-CM | POA: Diagnosis not present

## 2021-10-25 DIAGNOSIS — Z951 Presence of aortocoronary bypass graft: Secondary | ICD-10-CM | POA: Diagnosis not present

## 2021-10-25 DIAGNOSIS — J9811 Atelectasis: Secondary | ICD-10-CM | POA: Diagnosis not present

## 2021-10-25 DIAGNOSIS — M80052D Age-related osteoporosis with current pathological fracture, left femur, subsequent encounter for fracture with routine healing: Secondary | ICD-10-CM | POA: Diagnosis not present

## 2021-10-26 DIAGNOSIS — J9811 Atelectasis: Secondary | ICD-10-CM | POA: Diagnosis not present

## 2021-10-26 DIAGNOSIS — Z7901 Long term (current) use of anticoagulants: Secondary | ICD-10-CM | POA: Diagnosis not present

## 2021-10-26 DIAGNOSIS — C3490 Malignant neoplasm of unspecified part of unspecified bronchus or lung: Secondary | ICD-10-CM | POA: Diagnosis not present

## 2021-10-26 DIAGNOSIS — F17210 Nicotine dependence, cigarettes, uncomplicated: Secondary | ICD-10-CM | POA: Diagnosis not present

## 2021-10-26 DIAGNOSIS — Z9049 Acquired absence of other specified parts of digestive tract: Secondary | ICD-10-CM | POA: Diagnosis not present

## 2021-10-26 DIAGNOSIS — C7931 Secondary malignant neoplasm of brain: Secondary | ICD-10-CM | POA: Diagnosis not present

## 2021-10-26 DIAGNOSIS — Z951 Presence of aortocoronary bypass graft: Secondary | ICD-10-CM | POA: Diagnosis not present

## 2021-10-26 DIAGNOSIS — I252 Old myocardial infarction: Secondary | ICD-10-CM | POA: Diagnosis not present

## 2021-10-26 DIAGNOSIS — Z79891 Long term (current) use of opiate analgesic: Secondary | ICD-10-CM | POA: Diagnosis not present

## 2021-10-26 DIAGNOSIS — M5020 Other cervical disc displacement, unspecified cervical region: Secondary | ICD-10-CM | POA: Diagnosis not present

## 2021-10-26 DIAGNOSIS — E785 Hyperlipidemia, unspecified: Secondary | ICD-10-CM | POA: Diagnosis not present

## 2021-10-26 DIAGNOSIS — Z9181 History of falling: Secondary | ICD-10-CM | POA: Diagnosis not present

## 2021-10-26 DIAGNOSIS — I2510 Atherosclerotic heart disease of native coronary artery without angina pectoris: Secondary | ICD-10-CM | POA: Diagnosis not present

## 2021-10-26 DIAGNOSIS — D630 Anemia in neoplastic disease: Secondary | ICD-10-CM | POA: Diagnosis not present

## 2021-10-26 DIAGNOSIS — I4892 Unspecified atrial flutter: Secondary | ICD-10-CM | POA: Diagnosis not present

## 2021-10-26 DIAGNOSIS — E1165 Type 2 diabetes mellitus with hyperglycemia: Secondary | ICD-10-CM | POA: Diagnosis not present

## 2021-10-26 DIAGNOSIS — Z7984 Long term (current) use of oral hypoglycemic drugs: Secondary | ICD-10-CM | POA: Diagnosis not present

## 2021-10-26 DIAGNOSIS — I480 Paroxysmal atrial fibrillation: Secondary | ICD-10-CM | POA: Diagnosis not present

## 2021-10-26 DIAGNOSIS — K5900 Constipation, unspecified: Secondary | ICD-10-CM | POA: Diagnosis not present

## 2021-10-26 DIAGNOSIS — J9601 Acute respiratory failure with hypoxia: Secondary | ICD-10-CM | POA: Diagnosis not present

## 2021-10-26 DIAGNOSIS — M80052D Age-related osteoporosis with current pathological fracture, left femur, subsequent encounter for fracture with routine healing: Secondary | ICD-10-CM | POA: Diagnosis not present

## 2021-10-26 DIAGNOSIS — Z8673 Personal history of transient ischemic attack (TIA), and cerebral infarction without residual deficits: Secondary | ICD-10-CM | POA: Diagnosis not present

## 2021-10-26 DIAGNOSIS — I10 Essential (primary) hypertension: Secondary | ICD-10-CM | POA: Diagnosis not present

## 2021-10-27 DIAGNOSIS — H25812 Combined forms of age-related cataract, left eye: Secondary | ICD-10-CM | POA: Diagnosis not present

## 2021-10-27 NOTE — Progress Notes (Signed)
Patient Care Team: Townsend Roger, MD as PCP - General (Internal Medicine) Sharmon Revere as Physician Assistant (Cardiology)  Clinic Day:  10/28/21  Referring physician: Townsend Roger, MD  ASSESSMENT & PLAN:   Assessment & Plan: Lung cancer, lower lobe [facility]) She received 1 dose of chemotherapy in June 2022, with combination cisplatin/Taxol/pembrolizumab, and tolerated this very poorly and ended up in the hospital twice. She is now receiving palliative pembrolizumab since October, and tolerating well. CT chest from January 2023 is stable to mildly improved. We will plan to rescan her after another 3 months. She was tolerating pembrolizumab well.  She then went to [place] and has stopped coming for follow-up.  She now wishes to resume treatment and I recommended repeat staging scans at this time.  Brain metastases [facility]) Brain metastases diagnosed in April 2022.  This presented with right sided head pain and pain of the right eye.  She received stereotactic radiation to the two brain lesions. MRI brain from September 26th revealed four metastatic deposits in the brain. Several show mild associated hemorrhage. Mild edema. No mass-effect or midline shift. We will continue to monitor these. MRI brain from January revealed a mixed response with one new enhancing lesion in the right cerebellar hemisphere measuring 3 mm. One lesion in the left frontal lobe has resolved, one lesion in the right occipital lobe is stable, and two lesions are mildly decreased in size. We will rescan in early May.   Shoulder pain, right Worsening right shoulder pain. 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 Right shoulder x-ray revealed diffusely decreased mineralization of the bones. No acute fracture or dislocation. Hypertrophic degenerative changes present at the acromioclavicular joint. Mild degenerative changes at the glenohumeral joint. Sternotomy wires are noted over the  midline. The soft tissues are within normal limits.I will increase her oxycodone today to 10 mg as she states the 5 mg is not relieving her pain.  Hip fracture The patient was in [place] and fell and had a fracture of her hip.  She is still in a lot of pain including her left knee and back as well.  She requests a refill of oxycodone 10 mg and is taking 3-4 daily.     I will refill her oxycodone 10 mg to take every 4 hours as needed, #120.  I will schedule her for scans of CT chest and MRI of the brain.  She will return in 2 weeks with CBC and CMP to review those results.  Most likely we will resume her immunotherapy at that time.The patient understands the plans discussed today and is in agreement with them.  She knows to contact our office if she develops concerns prior to her next appointment.    Derwood Kaplan, MD  [place] [address] [place] [address] Dept: [phone] Dept Fax: [phone]     CHIEF COMPLAINT:  CC: A 76 year old female with history of lung cancer here for 3 week evaluation  Current Treatment:  Pembrolizumab  INTERVAL HISTORY:  [name] is here today for repeat clinical assessment.  She stopped coming for follow-up and was in [place] with her son.  During that time she fell and had a fracture of her hip and so is in a lot of pain.  She requests a refill of oxycodone and I will do so.  She wants to resume treatment but I recommended that  we repeat CT scan of the chest and MRI of the brain for restaging prior to resuming her immunotherapy.  She understands and agrees with this.  I will see her back in 2 weeks to review the results and likely arrange for continuing her immunotherapy.  She denies fevers or chills. She denies pain. Her appetite is poor but she is forcing herself to eat.  Her weight has increased 7 pounds since her last visit in March.  I have reviewed the past medical history,  past surgical history, social history and family history with the patient and they are unchanged from previous note.  ALLERGIES:  is allergic to ergocalciferol.  MEDICATIONS:  Current Outpatient Medications  Medication Sig Dispense Refill   ALPRAZolam (XANAX) 0.5 MG tablet Take 1 tablet (0.5 mg total) by mouth 3 (three) times daily as needed for anxiety. 90 tablet 0   atorvastatin (LIPITOR) 80 MG tablet Take 80 mg by mouth daily.     brimonidine (ALPHAGAN) 0.2 % ophthalmic solution in the morning and at bedtime. (Patient not taking: Reported on 04/18/2021)     brimonidine-timolol (COMBIGAN) 0.2-0.5 % ophthalmic solution Place 1 drop into the right eye 2 (two) times daily. (Patient not taking: Reported on 04/18/2021)     dexamethasone (DECADRON) 4 MG tablet Take 4 mg by mouth daily as needed.     gabapentin (NEURONTIN) 100 MG capsule Take by mouth.     glipiZIDE (GLUCOTROL XL) 5 MG 24 hr tablet Take 5 mg by mouth daily.     HUMULIN R 100 UNIT/ML injection      JANUVIA 100 MG tablet Take 100 mg by mouth daily.     LORazepam (ATIVAN) 0.5 MG tablet Take 1 tablet (0.5 mg total) by mouth at bedtime. 30 tablet 0   losartan (COZAAR) 25 MG tablet Take 25 mg by mouth daily.     metoprolol tartrate (LOPRESSOR) 25 MG tablet Take 25 mg by mouth 2 (two) times daily.     mupirocin cream (BACTROBAN) 2 % Apply topically.     ofloxacin (OCUFLOX) 0.3 % ophthalmic solution Place into the left eye. (Patient not taking: Reported on 04/18/2021)     ondansetron (ZOFRAN) 4 MG tablet Take 1 tablet (4 mg total) by mouth every 4 (four) hours as needed for nausea. 90 tablet 3   Oxycodone HCl 10 MG TABS Take 1 tablet (10 mg total) by mouth every 4 (four) hours as needed. 120 tablet 0   prednisoLONE acetate (PRED FORTE) 1 % ophthalmic suspension Place into the left eye.     prochlorperazine (COMPAZINE) 10 MG tablet Take 1 tablet (10 mg total) by mouth every 6 (six) hours as needed for nausea or vomiting. 90 tablet 3    rivaroxaban (XARELTO) 20 MG TABS tablet Take 20 mg by mouth daily with supper.     No current facility-administered medications for this visit.    HISTORY OF PRESENT ILLNESS:   Oncology History  Lung cancer, lower lobe ([place])  07/07/2020 Initial Diagnosis   Lung cancer, lower lobe ([place])    10/15/2020 Cancer Staging   Staging form: Lung, AJCC 8th Edition - Clinical stage from 10/15/2020: Stage IV (cT1b, cN0, cM1) - Signed by Derwood Kaplan, MD on 03/31/2021 Histopathologic type: Squamous cell carcinoma, NOS Stage prefix: Initial diagnosis Histologic grade (G): G3 Histologic grading system: 4 grade system Laterality: Right Tumor size (mm): 18 Sites of metastasis: Brain Lymph-vascular invasion (LVI): Presence of LVI unknown/indeterminate Diagnostic confirmation: Positive histology Specimen type: Core Needle  Biopsy Staged by: Managing physician Type of lung cancer: Locally advanced or metastatic non-small cell lung cancer Stage used in treatment planning: Yes National guidelines used in treatment planning: Yes Type of national guideline used in treatment planning: NCCN    04/08/2021 -  Chemotherapy   Patient is on Treatment Plan : LUNG NSCLC flat dose Pembrolizumab Q21D          REVIEW OF SYSTEMS:   Constitutional: Denies fevers, chills or abnormal weight loss Eyes: Denies blurriness of vision Ears, nose, mouth, throat, and face: Denies mucositis or sore throat Respiratory: Denies cough, dyspnea or wheezes Cardiovascular: Denies palpitation, chest discomfort or lower extremity swelling Gastrointestinal:  Denies nausea, heartburn or change in bowel habits Skin: Denies abnormal skin rashes Lymphatics: Denies new lymphadenopathy or easy bruising Neurological:Denies numbness, tingling or new weaknesses Behavioral/Psych: Mood is stable, no new changes  Musculoskeletal: She complains of severe pain of the hip and left knee as well as the back. All other systems were  reviewed with the patient and are negative.   VITALS:  Blood pressure (!) 166/70, pulse (!) 59, temperature 97.6 F (36.4 C), temperature source Oral, resp. rate 18, height 5\' 5"  (1.651 m), weight 126 lb 11.2 oz (57.5 kg), SpO2 96 %.  Wt Readings from Last 3 Encounters:  11/11/21 126 lb 3.2 oz (57.2 kg)  10/28/21 126 lb 11.2 oz (57.5 kg)  09/12/21 119 lb (54 kg)    Body mass index is 21.08 kg/m.  Performance status (ECOG): 1 - Symptomatic but completely ambulatory  PHYSICAL EXAM:   GENERAL:alert, no distress and comfortable SKIN: skin color, texture, turgor are normal, no rashes or significant lesions EYES: normal, Conjunctiva are pink and non-injected, sclera clear OROPHARYNX:no exudate, no erythema and lips, buccal mucosa, and tongue normal  NECK: supple, thyroid normal size, non-tender, without nodularity LYMPH:  no palpable lymphadenopathy in the cervical, axillary or inguinal LUNGS: clear to auscultation and percussion with normal breathing effort HEART: regular rate & rhythm and no murmurs and no lower extremity edema ABDOMEN:abdomen soft, non-tender and normal bowel sounds Musculoskeletal:no cyanosis of digits and no clubbing  NEURO: alert & oriented x 3 with fluent speech, no focal motor/sensory deficits  LABORATORY DATA:  I have reviewed the data as listed    Component Value Date/Time   NA 137 09/09/2021 0000   K 3.5 09/09/2021 0000   CL 102 09/09/2021 0000   CO2 26 (A) 09/09/2021 0000   GLUCOSE 83 10/20/2016 1618   BUN 17 09/09/2021 0000   CREATININE 0.9 09/09/2021 0000   CREATININE 1.07 (H) 10/20/2016 1618   CALCIUM 9.4 09/09/2021 0000   PROT 6.7 08/26/2010 0051   ALBUMIN 4.1 09/09/2021 0000   AST 25 09/09/2021 0000   ALT 21 09/09/2021 0000   ALKPHOS 109 09/09/2021 0000   BILITOT 0.5 08/26/2010 0051   GFRNONAA 51 (L) 10/20/2016 1618   GFRAA 59 (L) 10/20/2016 1618    No results found for: SPEP, UPEP  Lab Results  Component Value Date   WBC 8.2  09/09/2021   NEUTROABS 6.07 09/09/2021   HGB 12.3 09/09/2021   HCT 39 09/09/2021   MCV 89 06/10/2021   PLT 249 09/09/2021      Chemistry      Component Value Date/Time   NA 137 09/09/2021 0000   K 3.5 09/09/2021 0000   CL 102 09/09/2021 0000   CO2 26 (A) 09/09/2021 0000   BUN 17 09/09/2021 0000   CREATININE 0.9 09/09/2021 0000   CREATININE  1.07 (H) 10/20/2016 1618   GLU 255 09/09/2021 0000      Component Value Date/Time   CALCIUM 9.4 09/09/2021 0000   ALKPHOS 109 09/09/2021 0000   AST 25 09/09/2021 0000   ALT 21 09/09/2021 0000   BILITOT 0.5 08/26/2010 0051       RADIOGRAPHIC STUDIES:  No results found.

## 2021-10-28 ENCOUNTER — Other Ambulatory Visit: Payer: Self-pay | Admitting: Oncology

## 2021-10-28 ENCOUNTER — Encounter: Payer: Self-pay | Admitting: Oncology

## 2021-10-28 ENCOUNTER — Inpatient Hospital Stay: Payer: Medicare Other | Attending: Oncology | Admitting: Oncology

## 2021-10-28 VITALS — BP 166/70 | HR 59 | Temp 97.6°F | Resp 18 | Ht 65.0 in | Wt 126.7 lb

## 2021-10-28 DIAGNOSIS — J9811 Atelectasis: Secondary | ICD-10-CM | POA: Diagnosis not present

## 2021-10-28 DIAGNOSIS — J9601 Acute respiratory failure with hypoxia: Secondary | ICD-10-CM | POA: Diagnosis not present

## 2021-10-28 DIAGNOSIS — Z7984 Long term (current) use of oral hypoglycemic drugs: Secondary | ICD-10-CM | POA: Diagnosis not present

## 2021-10-28 DIAGNOSIS — E1165 Type 2 diabetes mellitus with hyperglycemia: Secondary | ICD-10-CM | POA: Diagnosis not present

## 2021-10-28 DIAGNOSIS — I10 Essential (primary) hypertension: Secondary | ICD-10-CM | POA: Diagnosis not present

## 2021-10-28 DIAGNOSIS — C3431 Malignant neoplasm of lower lobe, right bronchus or lung: Secondary | ICD-10-CM | POA: Insufficient documentation

## 2021-10-28 DIAGNOSIS — Z7901 Long term (current) use of anticoagulants: Secondary | ICD-10-CM | POA: Diagnosis not present

## 2021-10-28 DIAGNOSIS — I2510 Atherosclerotic heart disease of native coronary artery without angina pectoris: Secondary | ICD-10-CM | POA: Diagnosis not present

## 2021-10-28 DIAGNOSIS — I252 Old myocardial infarction: Secondary | ICD-10-CM | POA: Diagnosis not present

## 2021-10-28 DIAGNOSIS — C7931 Secondary malignant neoplasm of brain: Secondary | ICD-10-CM | POA: Diagnosis not present

## 2021-10-28 DIAGNOSIS — C3490 Malignant neoplasm of unspecified part of unspecified bronchus or lung: Secondary | ICD-10-CM

## 2021-10-28 DIAGNOSIS — D630 Anemia in neoplastic disease: Secondary | ICD-10-CM | POA: Diagnosis not present

## 2021-10-28 DIAGNOSIS — Z9049 Acquired absence of other specified parts of digestive tract: Secondary | ICD-10-CM | POA: Diagnosis not present

## 2021-10-28 DIAGNOSIS — Z5112 Encounter for antineoplastic immunotherapy: Secondary | ICD-10-CM | POA: Insufficient documentation

## 2021-10-28 DIAGNOSIS — K5900 Constipation, unspecified: Secondary | ICD-10-CM | POA: Diagnosis not present

## 2021-10-28 DIAGNOSIS — M80052D Age-related osteoporosis with current pathological fracture, left femur, subsequent encounter for fracture with routine healing: Secondary | ICD-10-CM | POA: Diagnosis not present

## 2021-10-28 DIAGNOSIS — M5020 Other cervical disc displacement, unspecified cervical region: Secondary | ICD-10-CM | POA: Diagnosis not present

## 2021-10-28 DIAGNOSIS — I480 Paroxysmal atrial fibrillation: Secondary | ICD-10-CM | POA: Diagnosis not present

## 2021-10-28 DIAGNOSIS — F17210 Nicotine dependence, cigarettes, uncomplicated: Secondary | ICD-10-CM | POA: Diagnosis not present

## 2021-10-28 DIAGNOSIS — Z8673 Personal history of transient ischemic attack (TIA), and cerebral infarction without residual deficits: Secondary | ICD-10-CM | POA: Diagnosis not present

## 2021-10-28 DIAGNOSIS — Z79891 Long term (current) use of opiate analgesic: Secondary | ICD-10-CM | POA: Diagnosis not present

## 2021-10-28 DIAGNOSIS — E785 Hyperlipidemia, unspecified: Secondary | ICD-10-CM | POA: Diagnosis not present

## 2021-10-28 DIAGNOSIS — Z9181 History of falling: Secondary | ICD-10-CM | POA: Diagnosis not present

## 2021-10-28 DIAGNOSIS — I4892 Unspecified atrial flutter: Secondary | ICD-10-CM | POA: Diagnosis not present

## 2021-10-28 DIAGNOSIS — Z951 Presence of aortocoronary bypass graft: Secondary | ICD-10-CM | POA: Diagnosis not present

## 2021-10-28 MED ORDER — OXYCODONE HCL 10 MG PO TABS: 10.0000 mg | ORAL_TABLET | ORAL | 0 refills | Status: DC | PRN

## 2021-11-01 DIAGNOSIS — E785 Hyperlipidemia, unspecified: Secondary | ICD-10-CM | POA: Diagnosis not present

## 2021-11-01 DIAGNOSIS — M5020 Other cervical disc displacement, unspecified cervical region: Secondary | ICD-10-CM | POA: Diagnosis not present

## 2021-11-01 DIAGNOSIS — Z951 Presence of aortocoronary bypass graft: Secondary | ICD-10-CM | POA: Diagnosis not present

## 2021-11-01 DIAGNOSIS — D630 Anemia in neoplastic disease: Secondary | ICD-10-CM | POA: Diagnosis not present

## 2021-11-01 DIAGNOSIS — Z9049 Acquired absence of other specified parts of digestive tract: Secondary | ICD-10-CM | POA: Diagnosis not present

## 2021-11-01 DIAGNOSIS — F17210 Nicotine dependence, cigarettes, uncomplicated: Secondary | ICD-10-CM | POA: Diagnosis not present

## 2021-11-01 DIAGNOSIS — C3490 Malignant neoplasm of unspecified part of unspecified bronchus or lung: Secondary | ICD-10-CM | POA: Diagnosis not present

## 2021-11-01 DIAGNOSIS — K5900 Constipation, unspecified: Secondary | ICD-10-CM | POA: Diagnosis not present

## 2021-11-01 DIAGNOSIS — Z8673 Personal history of transient ischemic attack (TIA), and cerebral infarction without residual deficits: Secondary | ICD-10-CM | POA: Diagnosis not present

## 2021-11-01 DIAGNOSIS — I2510 Atherosclerotic heart disease of native coronary artery without angina pectoris: Secondary | ICD-10-CM | POA: Diagnosis not present

## 2021-11-01 DIAGNOSIS — I252 Old myocardial infarction: Secondary | ICD-10-CM | POA: Diagnosis not present

## 2021-11-01 DIAGNOSIS — I480 Paroxysmal atrial fibrillation: Secondary | ICD-10-CM | POA: Diagnosis not present

## 2021-11-01 DIAGNOSIS — C7931 Secondary malignant neoplasm of brain: Secondary | ICD-10-CM | POA: Diagnosis not present

## 2021-11-01 DIAGNOSIS — M80052D Age-related osteoporosis with current pathological fracture, left femur, subsequent encounter for fracture with routine healing: Secondary | ICD-10-CM | POA: Diagnosis not present

## 2021-11-01 DIAGNOSIS — J9601 Acute respiratory failure with hypoxia: Secondary | ICD-10-CM | POA: Diagnosis not present

## 2021-11-01 DIAGNOSIS — Z9181 History of falling: Secondary | ICD-10-CM | POA: Diagnosis not present

## 2021-11-01 DIAGNOSIS — I10 Essential (primary) hypertension: Secondary | ICD-10-CM | POA: Diagnosis not present

## 2021-11-01 DIAGNOSIS — J9811 Atelectasis: Secondary | ICD-10-CM | POA: Diagnosis not present

## 2021-11-01 DIAGNOSIS — Z7901 Long term (current) use of anticoagulants: Secondary | ICD-10-CM | POA: Diagnosis not present

## 2021-11-01 DIAGNOSIS — E1165 Type 2 diabetes mellitus with hyperglycemia: Secondary | ICD-10-CM | POA: Diagnosis not present

## 2021-11-01 DIAGNOSIS — Z7984 Long term (current) use of oral hypoglycemic drugs: Secondary | ICD-10-CM | POA: Diagnosis not present

## 2021-11-01 DIAGNOSIS — Z79891 Long term (current) use of opiate analgesic: Secondary | ICD-10-CM | POA: Diagnosis not present

## 2021-11-01 DIAGNOSIS — I4892 Unspecified atrial flutter: Secondary | ICD-10-CM | POA: Diagnosis not present

## 2021-11-02 DIAGNOSIS — D630 Anemia in neoplastic disease: Secondary | ICD-10-CM | POA: Diagnosis not present

## 2021-11-02 DIAGNOSIS — Z951 Presence of aortocoronary bypass graft: Secondary | ICD-10-CM | POA: Diagnosis not present

## 2021-11-02 DIAGNOSIS — F17210 Nicotine dependence, cigarettes, uncomplicated: Secondary | ICD-10-CM | POA: Diagnosis not present

## 2021-11-02 DIAGNOSIS — J9811 Atelectasis: Secondary | ICD-10-CM | POA: Diagnosis not present

## 2021-11-02 DIAGNOSIS — I252 Old myocardial infarction: Secondary | ICD-10-CM | POA: Diagnosis not present

## 2021-11-02 DIAGNOSIS — J9601 Acute respiratory failure with hypoxia: Secondary | ICD-10-CM | POA: Diagnosis not present

## 2021-11-02 DIAGNOSIS — I2510 Atherosclerotic heart disease of native coronary artery without angina pectoris: Secondary | ICD-10-CM | POA: Diagnosis not present

## 2021-11-02 DIAGNOSIS — E1165 Type 2 diabetes mellitus with hyperglycemia: Secondary | ICD-10-CM | POA: Diagnosis not present

## 2021-11-02 DIAGNOSIS — Z7901 Long term (current) use of anticoagulants: Secondary | ICD-10-CM | POA: Diagnosis not present

## 2021-11-02 DIAGNOSIS — C3490 Malignant neoplasm of unspecified part of unspecified bronchus or lung: Secondary | ICD-10-CM | POA: Diagnosis not present

## 2021-11-02 DIAGNOSIS — I4892 Unspecified atrial flutter: Secondary | ICD-10-CM | POA: Diagnosis not present

## 2021-11-02 DIAGNOSIS — C7931 Secondary malignant neoplasm of brain: Secondary | ICD-10-CM | POA: Diagnosis not present

## 2021-11-02 DIAGNOSIS — Z8673 Personal history of transient ischemic attack (TIA), and cerebral infarction without residual deficits: Secondary | ICD-10-CM | POA: Diagnosis not present

## 2021-11-02 DIAGNOSIS — Z9049 Acquired absence of other specified parts of digestive tract: Secondary | ICD-10-CM | POA: Diagnosis not present

## 2021-11-02 DIAGNOSIS — I480 Paroxysmal atrial fibrillation: Secondary | ICD-10-CM | POA: Diagnosis not present

## 2021-11-02 DIAGNOSIS — M5020 Other cervical disc displacement, unspecified cervical region: Secondary | ICD-10-CM | POA: Diagnosis not present

## 2021-11-02 DIAGNOSIS — M80052D Age-related osteoporosis with current pathological fracture, left femur, subsequent encounter for fracture with routine healing: Secondary | ICD-10-CM | POA: Diagnosis not present

## 2021-11-02 DIAGNOSIS — E785 Hyperlipidemia, unspecified: Secondary | ICD-10-CM | POA: Diagnosis not present

## 2021-11-02 DIAGNOSIS — Z7984 Long term (current) use of oral hypoglycemic drugs: Secondary | ICD-10-CM | POA: Diagnosis not present

## 2021-11-02 DIAGNOSIS — I10 Essential (primary) hypertension: Secondary | ICD-10-CM | POA: Diagnosis not present

## 2021-11-02 DIAGNOSIS — Z79891 Long term (current) use of opiate analgesic: Secondary | ICD-10-CM | POA: Diagnosis not present

## 2021-11-02 DIAGNOSIS — K5900 Constipation, unspecified: Secondary | ICD-10-CM | POA: Diagnosis not present

## 2021-11-02 DIAGNOSIS — Z9181 History of falling: Secondary | ICD-10-CM | POA: Diagnosis not present

## 2021-11-04 DIAGNOSIS — Z7984 Long term (current) use of oral hypoglycemic drugs: Secondary | ICD-10-CM | POA: Diagnosis not present

## 2021-11-04 DIAGNOSIS — Z8673 Personal history of transient ischemic attack (TIA), and cerebral infarction without residual deficits: Secondary | ICD-10-CM | POA: Diagnosis not present

## 2021-11-04 DIAGNOSIS — J9601 Acute respiratory failure with hypoxia: Secondary | ICD-10-CM | POA: Diagnosis not present

## 2021-11-04 DIAGNOSIS — I4892 Unspecified atrial flutter: Secondary | ICD-10-CM | POA: Diagnosis not present

## 2021-11-04 DIAGNOSIS — R269 Unspecified abnormalities of gait and mobility: Secondary | ICD-10-CM | POA: Diagnosis not present

## 2021-11-04 DIAGNOSIS — C7931 Secondary malignant neoplasm of brain: Secondary | ICD-10-CM | POA: Diagnosis not present

## 2021-11-04 DIAGNOSIS — K5900 Constipation, unspecified: Secondary | ICD-10-CM | POA: Diagnosis not present

## 2021-11-04 DIAGNOSIS — I2510 Atherosclerotic heart disease of native coronary artery without angina pectoris: Secondary | ICD-10-CM | POA: Diagnosis not present

## 2021-11-04 DIAGNOSIS — Z951 Presence of aortocoronary bypass graft: Secondary | ICD-10-CM | POA: Diagnosis not present

## 2021-11-04 DIAGNOSIS — Z79891 Long term (current) use of opiate analgesic: Secondary | ICD-10-CM | POA: Diagnosis not present

## 2021-11-04 DIAGNOSIS — C3490 Malignant neoplasm of unspecified part of unspecified bronchus or lung: Secondary | ICD-10-CM | POA: Diagnosis not present

## 2021-11-04 DIAGNOSIS — F17210 Nicotine dependence, cigarettes, uncomplicated: Secondary | ICD-10-CM | POA: Diagnosis not present

## 2021-11-04 DIAGNOSIS — M80052D Age-related osteoporosis with current pathological fracture, left femur, subsequent encounter for fracture with routine healing: Secondary | ICD-10-CM | POA: Diagnosis not present

## 2021-11-04 DIAGNOSIS — Z9181 History of falling: Secondary | ICD-10-CM | POA: Diagnosis not present

## 2021-11-04 DIAGNOSIS — I10 Essential (primary) hypertension: Secondary | ICD-10-CM | POA: Diagnosis not present

## 2021-11-04 DIAGNOSIS — E785 Hyperlipidemia, unspecified: Secondary | ICD-10-CM | POA: Diagnosis not present

## 2021-11-04 DIAGNOSIS — J9811 Atelectasis: Secondary | ICD-10-CM | POA: Diagnosis not present

## 2021-11-04 DIAGNOSIS — I480 Paroxysmal atrial fibrillation: Secondary | ICD-10-CM | POA: Diagnosis not present

## 2021-11-04 DIAGNOSIS — Z9049 Acquired absence of other specified parts of digestive tract: Secondary | ICD-10-CM | POA: Diagnosis not present

## 2021-11-04 DIAGNOSIS — Z7901 Long term (current) use of anticoagulants: Secondary | ICD-10-CM | POA: Diagnosis not present

## 2021-11-04 DIAGNOSIS — D630 Anemia in neoplastic disease: Secondary | ICD-10-CM | POA: Diagnosis not present

## 2021-11-04 DIAGNOSIS — I252 Old myocardial infarction: Secondary | ICD-10-CM | POA: Diagnosis not present

## 2021-11-04 DIAGNOSIS — E1165 Type 2 diabetes mellitus with hyperglycemia: Secondary | ICD-10-CM | POA: Diagnosis not present

## 2021-11-04 DIAGNOSIS — M5020 Other cervical disc displacement, unspecified cervical region: Secondary | ICD-10-CM | POA: Diagnosis not present

## 2021-11-07 DIAGNOSIS — C7931 Secondary malignant neoplasm of brain: Secondary | ICD-10-CM | POA: Diagnosis not present

## 2021-11-07 DIAGNOSIS — G939 Disorder of brain, unspecified: Secondary | ICD-10-CM | POA: Diagnosis not present

## 2021-11-07 DIAGNOSIS — I700 Atherosclerosis of aorta: Secondary | ICD-10-CM | POA: Diagnosis not present

## 2021-11-07 DIAGNOSIS — R918 Other nonspecific abnormal finding of lung field: Secondary | ICD-10-CM | POA: Diagnosis not present

## 2021-11-07 DIAGNOSIS — I639 Cerebral infarction, unspecified: Secondary | ICD-10-CM | POA: Diagnosis not present

## 2021-11-07 DIAGNOSIS — R911 Solitary pulmonary nodule: Secondary | ICD-10-CM | POA: Diagnosis not present

## 2021-11-07 DIAGNOSIS — C3490 Malignant neoplasm of unspecified part of unspecified bronchus or lung: Secondary | ICD-10-CM | POA: Diagnosis not present

## 2021-11-07 DIAGNOSIS — D3502 Benign neoplasm of left adrenal gland: Secondary | ICD-10-CM | POA: Diagnosis not present

## 2021-11-07 DIAGNOSIS — I2510 Atherosclerotic heart disease of native coronary artery without angina pectoris: Secondary | ICD-10-CM | POA: Diagnosis not present

## 2021-11-07 LAB — BASIC METABOLIC PANEL
BUN: 16 (ref 4–21)
CO2: 27 — AB (ref 13–22)
Chloride: 104 (ref 99–108)
Creatinine: 0.8 (ref 0.5–1.1)
Glucose: 95
Potassium: 4.2 mEq/L (ref 3.5–5.1)
Sodium: 141 (ref 137–147)

## 2021-11-07 LAB — CBC AND DIFFERENTIAL
HCT: 35 — AB (ref 36–46)
Hemoglobin: 11 — AB (ref 12.0–16.0)
Neutrophils Absolute: 3.85
Platelets: 214 10*3/uL (ref 150–400)
WBC: 7

## 2021-11-07 LAB — HEPATIC FUNCTION PANEL
ALT: 13 U/L (ref 7–35)
AST: 21 (ref 13–35)
Alkaline Phosphatase: 139 — AB (ref 25–125)
Bilirubin, Total: 0.6

## 2021-11-07 LAB — COMPREHENSIVE METABOLIC PANEL
Albumin: 4.2 (ref 3.5–5.0)
Calcium: 9.3 (ref 8.7–10.7)

## 2021-11-07 LAB — CBC: RBC: 4.02 (ref 3.87–5.11)

## 2021-11-08 ENCOUNTER — Other Ambulatory Visit: Payer: Self-pay | Admitting: Oncology

## 2021-11-08 DIAGNOSIS — I2510 Atherosclerotic heart disease of native coronary artery without angina pectoris: Secondary | ICD-10-CM | POA: Diagnosis not present

## 2021-11-08 DIAGNOSIS — J9601 Acute respiratory failure with hypoxia: Secondary | ICD-10-CM | POA: Diagnosis not present

## 2021-11-08 DIAGNOSIS — F17210 Nicotine dependence, cigarettes, uncomplicated: Secondary | ICD-10-CM | POA: Diagnosis not present

## 2021-11-08 DIAGNOSIS — Z951 Presence of aortocoronary bypass graft: Secondary | ICD-10-CM | POA: Diagnosis not present

## 2021-11-08 DIAGNOSIS — Z79891 Long term (current) use of opiate analgesic: Secondary | ICD-10-CM | POA: Diagnosis not present

## 2021-11-08 DIAGNOSIS — I4892 Unspecified atrial flutter: Secondary | ICD-10-CM | POA: Diagnosis not present

## 2021-11-08 DIAGNOSIS — Z9049 Acquired absence of other specified parts of digestive tract: Secondary | ICD-10-CM | POA: Diagnosis not present

## 2021-11-08 DIAGNOSIS — C3490 Malignant neoplasm of unspecified part of unspecified bronchus or lung: Secondary | ICD-10-CM | POA: Diagnosis not present

## 2021-11-08 DIAGNOSIS — I252 Old myocardial infarction: Secondary | ICD-10-CM | POA: Diagnosis not present

## 2021-11-08 DIAGNOSIS — I480 Paroxysmal atrial fibrillation: Secondary | ICD-10-CM | POA: Diagnosis not present

## 2021-11-08 DIAGNOSIS — E785 Hyperlipidemia, unspecified: Secondary | ICD-10-CM | POA: Diagnosis not present

## 2021-11-08 DIAGNOSIS — Z8673 Personal history of transient ischemic attack (TIA), and cerebral infarction without residual deficits: Secondary | ICD-10-CM | POA: Diagnosis not present

## 2021-11-08 DIAGNOSIS — I10 Essential (primary) hypertension: Secondary | ICD-10-CM | POA: Diagnosis not present

## 2021-11-08 DIAGNOSIS — E1165 Type 2 diabetes mellitus with hyperglycemia: Secondary | ICD-10-CM | POA: Diagnosis not present

## 2021-11-08 DIAGNOSIS — Z9181 History of falling: Secondary | ICD-10-CM | POA: Diagnosis not present

## 2021-11-08 DIAGNOSIS — K5900 Constipation, unspecified: Secondary | ICD-10-CM | POA: Diagnosis not present

## 2021-11-08 DIAGNOSIS — Z7984 Long term (current) use of oral hypoglycemic drugs: Secondary | ICD-10-CM | POA: Diagnosis not present

## 2021-11-08 DIAGNOSIS — C7931 Secondary malignant neoplasm of brain: Secondary | ICD-10-CM | POA: Diagnosis not present

## 2021-11-08 DIAGNOSIS — D630 Anemia in neoplastic disease: Secondary | ICD-10-CM | POA: Diagnosis not present

## 2021-11-08 DIAGNOSIS — M5020 Other cervical disc displacement, unspecified cervical region: Secondary | ICD-10-CM | POA: Diagnosis not present

## 2021-11-08 DIAGNOSIS — M80052D Age-related osteoporosis with current pathological fracture, left femur, subsequent encounter for fracture with routine healing: Secondary | ICD-10-CM | POA: Diagnosis not present

## 2021-11-08 DIAGNOSIS — Z7901 Long term (current) use of anticoagulants: Secondary | ICD-10-CM | POA: Diagnosis not present

## 2021-11-08 DIAGNOSIS — J9811 Atelectasis: Secondary | ICD-10-CM | POA: Diagnosis not present

## 2021-11-09 ENCOUNTER — Encounter: Payer: Self-pay | Admitting: Oncology

## 2021-11-09 ENCOUNTER — Telehealth: Payer: Self-pay

## 2021-11-09 DIAGNOSIS — E785 Hyperlipidemia, unspecified: Secondary | ICD-10-CM | POA: Diagnosis not present

## 2021-11-09 DIAGNOSIS — I2510 Atherosclerotic heart disease of native coronary artery without angina pectoris: Secondary | ICD-10-CM | POA: Diagnosis not present

## 2021-11-09 DIAGNOSIS — I252 Old myocardial infarction: Secondary | ICD-10-CM | POA: Diagnosis not present

## 2021-11-09 DIAGNOSIS — J9811 Atelectasis: Secondary | ICD-10-CM | POA: Diagnosis not present

## 2021-11-09 DIAGNOSIS — Z9181 History of falling: Secondary | ICD-10-CM | POA: Diagnosis not present

## 2021-11-09 DIAGNOSIS — Z9049 Acquired absence of other specified parts of digestive tract: Secondary | ICD-10-CM | POA: Diagnosis not present

## 2021-11-09 DIAGNOSIS — J9601 Acute respiratory failure with hypoxia: Secondary | ICD-10-CM | POA: Diagnosis not present

## 2021-11-09 DIAGNOSIS — I4892 Unspecified atrial flutter: Secondary | ICD-10-CM | POA: Diagnosis not present

## 2021-11-09 DIAGNOSIS — Z7901 Long term (current) use of anticoagulants: Secondary | ICD-10-CM | POA: Diagnosis not present

## 2021-11-09 DIAGNOSIS — D630 Anemia in neoplastic disease: Secondary | ICD-10-CM | POA: Diagnosis not present

## 2021-11-09 DIAGNOSIS — K5900 Constipation, unspecified: Secondary | ICD-10-CM | POA: Diagnosis not present

## 2021-11-09 DIAGNOSIS — Z951 Presence of aortocoronary bypass graft: Secondary | ICD-10-CM | POA: Diagnosis not present

## 2021-11-09 DIAGNOSIS — C3490 Malignant neoplasm of unspecified part of unspecified bronchus or lung: Secondary | ICD-10-CM | POA: Diagnosis not present

## 2021-11-09 DIAGNOSIS — Z8673 Personal history of transient ischemic attack (TIA), and cerebral infarction without residual deficits: Secondary | ICD-10-CM | POA: Diagnosis not present

## 2021-11-09 DIAGNOSIS — I10 Essential (primary) hypertension: Secondary | ICD-10-CM | POA: Diagnosis not present

## 2021-11-09 DIAGNOSIS — F17210 Nicotine dependence, cigarettes, uncomplicated: Secondary | ICD-10-CM | POA: Diagnosis not present

## 2021-11-09 DIAGNOSIS — E1165 Type 2 diabetes mellitus with hyperglycemia: Secondary | ICD-10-CM | POA: Diagnosis not present

## 2021-11-09 DIAGNOSIS — M80052D Age-related osteoporosis with current pathological fracture, left femur, subsequent encounter for fracture with routine healing: Secondary | ICD-10-CM | POA: Diagnosis not present

## 2021-11-09 DIAGNOSIS — Z79891 Long term (current) use of opiate analgesic: Secondary | ICD-10-CM | POA: Diagnosis not present

## 2021-11-09 DIAGNOSIS — Z7984 Long term (current) use of oral hypoglycemic drugs: Secondary | ICD-10-CM | POA: Diagnosis not present

## 2021-11-09 DIAGNOSIS — I480 Paroxysmal atrial fibrillation: Secondary | ICD-10-CM | POA: Diagnosis not present

## 2021-11-09 DIAGNOSIS — M5020 Other cervical disc displacement, unspecified cervical region: Secondary | ICD-10-CM | POA: Diagnosis not present

## 2021-11-09 DIAGNOSIS — C7931 Secondary malignant neoplasm of brain: Secondary | ICD-10-CM | POA: Diagnosis not present

## 2021-11-09 NOTE — Telephone Encounter (Signed)
-----   Message from Derwood Kaplan, MD sent at 11/09/2021  3:10 PM EDT ----- ?Regarding: call ?Tell her scans look good, will discuss at Friday appt ? ?

## 2021-11-09 NOTE — Telephone Encounter (Signed)
Patient notified

## 2021-11-09 NOTE — Progress Notes (Signed)
Patient Care Team: Townsend Roger, MD as PCP - General (Internal Medicine) Sharmon Revere as Physician Assistant (Cardiology)  Clinic Day: 11/11/21  Referring physician: Townsend Roger, MD  ASSESSMENT & PLAN:   Assessment & Plan: Lung cancer, lower lobe [facility]) She received 1 dose of chemotherapy in June 2022, with combination cisplatin/Taxol/pembrolizumab, and tolerated this very poorly and ended up in the hospital twice. She is now receiving palliative pembrolizumab since October, and tolerating well. CT chest from January 2023 is stable to mildly improved.  The patient took a break and went to [place] for a time and so he missed some doses.  She is here today with repeat scans and all appears stable so we will resume treatment next week with pembrolizumab every 3 weeks.  She is now agreeable to a port placement as she realizes this is long-term therapy   Brain metastases [facility]) Brain metastases diagnosed in April 2022.  This presented with right sided head pain and pain of the right eye.  She received stereotactic radiation to the two brain lesions. MRI brain from September 26th revealed four metastatic deposits in the brain. Several show mild associated hemorrhage. Mild edema. No mass-effect or midline shift. We will continue to monitor these. MRI brain from January revealed a mixed response with one new enhancing lesion in the right cerebellar hemisphere measuring 3 mm. One lesion in the left frontal lobe has resolved, one lesion in the right occipital lobe is stable, and two lesions are mildly decreased in size.  Repeat scan shows resolution of the right cerebellar lesion and decreased size of the right occipital and bilateral frontal lesions   Shoulder pain, right Worsening right shoulder pain. 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 Right shoulder x-ray revealed diffusely decreased mineralization of the bones. No acute fracture or dislocation.  Hypertrophic degenerative changes present at the acromioclavicular joint. Mild degenerative changes at the glenohumeral joint. Sternotomy wires are noted over the midline. The soft tissues are within normal limits.  Mild anemia Her hemoglobin was 12.3 in March and is down to 11.0.  If this persists, we will pursue an anemia evaluation with additional labs.  I encouraged her to try the Remeron to see if this will help her appetite.   We reviewed the CT scan of the chest and MRI of the brain as well as her lab work and we are encouraged by the stable to improved appearance.  She is glad to resume pembrolizumab next week.  She actually discussed smoking cessation and whether a nicotine patch would help.  We will encourage her to pursue that.  She currently smokes 1-1/2 packs/day so would need a fairly high dose of nicotine patch to start with.  I will draw PT and PTT today so that we can schedule her for port placement.  I have encouraged her to try the Remeron for her appetite.  We will see her back in 3 weeks with CBC and comprehensive metabolic profile prior to her next dose.The patient understands the plans discussed today and is in agreement with them.  She knows to contact our office if she develops concerns prior to her next appointment.    Derwood Kaplan, MD  [facility] AT [facility] [address] [place] [address] Dept: [phone] Dept Fax: [phone]      CHIEF COMPLAINT:  CC: A 76 year old female with history of lung cancer here  for 3 week evaluation  Current Treatment:  Pembrolizumab  INTERVAL HISTORY:  [name] is here today for repeat clinical assessment.  She had relocated to [place] in April and returned earlier this month.  She complains of poor appetite and soreness of her left hip where she injured it.  I had prescribed Remeron but she did not take it so I have once again encouraged her to give it a try.  We  decided to repeat scans prior to continuing therapy and she is here to review those results.  The CT of the chest shows stability with an unchanged spiculated nodule of the right lower lobe measuring 1.9 cm with multiple additional irregular nodules of the right lower lobe, also unchanged.  The MRI of the brain shows resolution of the right cerebellar lesion and decrease in size of the right occipital lesion and the 2 bilateral frontal lobe lesions these were already tiny on the last scan and so continue to improve.  Her labs show her hemoglobin has decreased from 12.3-11.0 with an MCV of 87 and normal white count, normal platelet count.  This persists, we will pursue a lab evaluation.  CMP is normal. She denies fevers or chills. She denies pain. Her appetite is good. Her weight is stable.  I have reviewed the past medical history, past surgical history, social history and family history with the patient and they are unchanged from previous note.  ALLERGIES:  is allergic to ergocalciferol.  MEDICATIONS:  Current Outpatient Medications  Medication Sig Dispense Refill   ALPRAZolam (XANAX) 0.5 MG tablet Take 1 tablet (0.5 mg total) by mouth 3 (three) times daily as needed for anxiety. 90 tablet 0   atorvastatin (LIPITOR) 80 MG tablet Take 80 mg by mouth daily.     brimonidine (ALPHAGAN) 0.2 % ophthalmic solution in the morning and at bedtime. (Patient not taking: Reported on 04/18/2021)     brimonidine-timolol (COMBIGAN) 0.2-0.5 % ophthalmic solution Place 1 drop into the right eye 2 (two) times daily. (Patient not taking: Reported on 04/18/2021)     dexamethasone (DECADRON) 4 MG tablet Take 4 mg by mouth daily as needed.     gabapentin (NEURONTIN) 100 MG capsule Take by mouth.     glipiZIDE (GLUCOTROL XL) 5 MG 24 hr tablet Take 5 mg by mouth daily.     HUMULIN R 100 UNIT/ML injection      JANUVIA 100 MG tablet Take 100 mg by mouth daily.     LORazepam (ATIVAN) 0.5 MG tablet Take 1 tablet (0.5 mg  total) by mouth at bedtime. 30 tablet 0   losartan (COZAAR) 25 MG tablet Take 25 mg by mouth daily.     metoprolol tartrate (LOPRESSOR) 25 MG tablet Take 25 mg by mouth 2 (two) times daily.     mupirocin cream (BACTROBAN) 2 % Apply topically.     ofloxacin (OCUFLOX) 0.3 % ophthalmic solution Place into the left eye. (Patient not taking: Reported on 04/18/2021)     ondansetron (ZOFRAN) 4 MG tablet Take 1 tablet (4 mg total) by mouth every 4 (four) hours as needed for nausea. 90 tablet 3   oxyCODONE (OXY IR/ROXICODONE) 5 MG immediate release tablet Take 2 tablets (10 mg total) by mouth every 4 (four) hours as needed for severe pain. 120 tablet 0   Oxycodone HCl 10 MG TABS Take 1 tablet (10 mg total) by mouth every 4 (four) hours as needed. 120 tablet 0   prednisoLONE acetate (PRED FORTE) 1 % ophthalmic suspension  Place into the left eye.     prochlorperazine (COMPAZINE) 10 MG tablet Take 1 tablet (10 mg total) by mouth every 6 (six) hours as needed for nausea or vomiting. 90 tablet 3   rivaroxaban (XARELTO) 20 MG TABS tablet Take 20 mg by mouth daily with supper.     No current facility-administered medications for this visit.    HISTORY OF PRESENT ILLNESS:   Oncology History  Lung cancer, lower lobe ([place])  07/07/2020 Initial Diagnosis   Lung cancer, lower lobe ([place])   10/15/2020 Cancer Staging   Staging form: Lung, AJCC 8th Edition - Clinical stage from 10/15/2020: Stage IV (cT1b, cN0, cM1) - Signed by Derwood Kaplan, MD on 03/31/2021 Histopathologic type: Squamous cell carcinoma, NOS Stage prefix: Initial diagnosis Histologic grade (G): G3 Histologic grading system: 4 grade system Laterality: Right Tumor size (mm): 18 Sites of metastasis: Brain Lymph-vascular invasion (LVI): Presence of LVI unknown/indeterminate Diagnostic confirmation: Positive histology Specimen type: Core Needle Biopsy Staged by: Managing physician Type of lung cancer: Locally advanced or metastatic non-small  cell lung cancer Stage used in treatment planning: Yes National guidelines used in treatment planning: Yes Type of national guideline used in treatment planning: NCCN   04/08/2021 -  Chemotherapy   Patient is on Treatment Plan : LUNG NSCLC flat dose Pembrolizumab Q21D         REVIEW OF SYSTEMS:   Constitutional: Denies fevers, chills or abnormal weight loss Her appetite is poor. Eyes: Denies blurriness of vision Ears, nose, mouth, throat, and face: Denies mucositis or sore throat Respiratory: Denies cough, dyspnea or wheezes Cardiovascular: Denies palpitation, chest discomfort or lower extremity swelling Gastrointestinal:  Denies nausea, heartburn or change in bowel habits Skin: Denies abnormal skin rashes Lymphatics: Denies new lymphadenopathy or easy bruising Neurological:Denies numbness, tingling or new weaknesses Behavioral/Psych: Mood is stable, no new changes  All other systems were reviewed with the patient and are negative.   VITALS:  Blood pressure (!) 152/71, pulse (!) 53, temperature 97.9 F (36.6 C), temperature source Oral, resp. rate 18, height 5\' 5"  (1.651 m), weight 126 lb 3.2 oz (57.2 kg), SpO2 98 %.  Wt Readings from Last 3 Encounters:  11/15/21 127 lb (57.6 kg)  11/11/21 126 lb 3.2 oz (57.2 kg)  10/28/21 126 lb 11.2 oz (57.5 kg)    Body mass index is 21 kg/m.  Performance status (ECOG): 1 - Symptomatic but completely ambulatory  PHYSICAL EXAM:   GENERAL:alert, no distress and comfortable SKIN: skin color, texture, turgor are normal, no rashes or significant lesions EYES: normal, Conjunctiva are pink and non-injected, sclera clear OROPHARYNX:no exudate, no erythema and lips, buccal mucosa, and tongue normal  NECK: supple, thyroid normal size, non-tender, without nodularity LYMPH:  no palpable lymphadenopathy in the cervical, axillary or inguinal LUNGS: clear to auscultation and percussion with normal breathing effort HEART: irregular rhythm with  bradycardia and no murmurs and no lower extremity edema ABDOMEN:abdomen soft, non-tender and normal bowel sounds Musculoskeletal:no cyanosis of digits and no clubbing  NEURO: alert & oriented x 3 with fluent speech, no focal motor/sensory deficits  LABORATORY DATA:  I have reviewed the data as listed    Component Value Date/Time   NA 137 09/09/2021 0000   K 3.5 09/09/2021 0000   CL 102 09/09/2021 0000   CO2 26 (A) 09/09/2021 0000   GLUCOSE 83 10/20/2016 1618   BUN 17 09/09/2021 0000   CREATININE 0.9 09/09/2021 0000   CREATININE 1.07 (H) 10/20/2016 1618   CALCIUM 9.4  09/09/2021 0000   PROT 6.7 08/26/2010 0051   ALBUMIN 4.1 09/09/2021 0000   AST 25 09/09/2021 0000   ALT 21 09/09/2021 0000   ALKPHOS 109 09/09/2021 0000   BILITOT 0.5 08/26/2010 0051   GFRNONAA 51 (L) 10/20/2016 1618   GFRAA 59 (L) 10/20/2016 1618    No results found for: "SPEP", "UPEP"  Lab Results  Component Value Date   WBC 8.2 09/09/2021   NEUTROABS 6.07 09/09/2021   HGB 12.3 09/09/2021   HCT 39 09/09/2021   MCV 89 06/10/2021   PLT 249 09/09/2021      Chemistry      Component Value Date/Time   NA 137 09/09/2021 0000   K 3.5 09/09/2021 0000   CL 102 09/09/2021 0000   CO2 26 (A) 09/09/2021 0000   BUN 17 09/09/2021 0000   CREATININE 0.9 09/09/2021 0000   CREATININE 1.07 (H) 10/20/2016 1618   GLU 255 09/09/2021 0000      Component Value Date/Time   CALCIUM 9.4 09/09/2021 0000   ALKPHOS 109 09/09/2021 0000   AST 25 09/09/2021 0000   ALT 21 09/09/2021 0000   BILITOT 0.5 08/26/2010 0051       RADIOGRAPHIC STUDIES: I have personally reviewed the radiological images as listed and agreed with the findings in the report.    EXAM: 11/07/21 MRI HEAD WITHOUT AND WITH CONTRAST  TECHNIQUE:  Multiplanar, multiecho pulse sequences of the brain and surrounding  structures were obtained without and with intravenous contrast.  COMPARISON: MRI of the brain June 29, 2021.  FINDINGS:  The study is  degraded by motion.  Brain: No new enhancing lesion identified.  Inferior right cerebellar hemisphere lesion described on prior MRI  is no longer seen.  Interval decreased size of enhancing lesion in the right occipital  lobe, now measuring 4 mm compared to 6 mm on prior (series 11, image  106).  Punctate residual focus of contrast enhancement with hemosiderin  deposit in the left frontal operculum, now measuring 1 mm compared  to 2 mm on prior (series 11, image 122).  Cortical focus of residual contrast enhancement with hemosiderin  deposit in the posterior right frontal lobe, now measuring 4 mm  compared to 5 mm on prior (series 11, image 158).  No acute infarction, hemorrhage, hydrocephalus or extra-axial  collection. 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  Scattered and confluent foci of T2 hyperintensity are seen within  the white matter of the cerebral hemispheres are nonspecific, most  likely related to chronic small vessel ischemia.  Vascular: Normal flow voids.  Skull and upper cervical spine: Normal marrow signal. Sinuses/Orbits: Negative.  Other: None.  IMPRESSION:  Findings consistent with treatment response with resolution of right  cerebella lesion described on prior MRI and decrease in size of  right occipital and bilateral frontal lesions, as described above.  No new enhancing lesion identified.   Electronically Signed  By: Pedro Earls M.D.  On: 11/08/2021 15:25     EXAM: 11/07/21 CT CHEST WITHOUT CONTRAST  TECHNIQUE:  Multidetector CT imaging of the chest was performed following the  standard protocol without IV contrast.  RADIATION DOSE REDUCTION: This exam was performed according to the  departmental dose-optimization program which includes automated  exposure control, adjustment of the mA and/or kV according to  patient size and/or use of iterative  reconstruction technique.  COMPARISON: 06/29/2021  FINDINGS:  Cardiovascular: Aortic atherosclerosis. Normal heart size.  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 No pericardial effusion.  Mediastinum/Nodes: No enlarged mediastinal, hilar, or axillary lymph  nodes. Thyroid gland, trachea, and esophagus demonstrate no  significant findings.  Lungs/Pleura: Unchanged spiculated nodule of the superior segment  right lower lobe measuring 1.9 x 1.6 cm (series 301, image 62).  Multiple additional generally clustered, irregular nodules of the  right lower lobe are likewise unchanged, most notably a 0.7 cm  nodule of the peripheral subpleural right lower lobe (series 301,  image 71). Background of extensive, fine centrilobular nodularity  throughout, generally concentrated in the lung apices. No pleural  effusion or pneumothorax.  Upper Abdomen: No acute abnormality. Stable, benign fatty  attenuation left adrenal adenoma (series 2, image 117). Status post  cholecystectomy.  Musculoskeletal: No chest wall abnormality. No suspicious osseous  lesions identified.  IMPRESSION:  1. Unchanged spiculated nodule of the superior segment right lower  lobe measuring 1.9 x 1.6 cm.  2. Multiple additional generally clustered, irregular nodules of the  right lower lobe are likewise unchanged, measuring 0.7 cm and  smaller.  3. Background of extensive, fine centrilobular nodularity  throughout, concentrated in the lung apices, consistent with  smoking-related respiratory bronchiolitis.  4. Coronary artery disease.  5. Stable, benign fatty attenuation left adrenal adenoma. This  demonstrates internal macroscopic fatty attenuation and has been  stable greater than 5 years.  Aortic Atherosclerosis (ICD10-I70.0).   Electronically Signed  By: Delanna Ahmadi M.D.  On: 11/08/2021 15:09

## 2021-11-10 DIAGNOSIS — M80052D Age-related osteoporosis with current pathological fracture, left femur, subsequent encounter for fracture with routine healing: Secondary | ICD-10-CM | POA: Diagnosis not present

## 2021-11-10 DIAGNOSIS — I480 Paroxysmal atrial fibrillation: Secondary | ICD-10-CM | POA: Diagnosis not present

## 2021-11-10 DIAGNOSIS — Z7901 Long term (current) use of anticoagulants: Secondary | ICD-10-CM | POA: Diagnosis not present

## 2021-11-10 DIAGNOSIS — Z7984 Long term (current) use of oral hypoglycemic drugs: Secondary | ICD-10-CM | POA: Diagnosis not present

## 2021-11-10 DIAGNOSIS — K5900 Constipation, unspecified: Secondary | ICD-10-CM | POA: Diagnosis not present

## 2021-11-10 DIAGNOSIS — H4051X3 Glaucoma secondary to other eye disorders, right eye, severe stage: Secondary | ICD-10-CM | POA: Diagnosis not present

## 2021-11-10 DIAGNOSIS — J9601 Acute respiratory failure with hypoxia: Secondary | ICD-10-CM | POA: Diagnosis not present

## 2021-11-10 DIAGNOSIS — Z9049 Acquired absence of other specified parts of digestive tract: Secondary | ICD-10-CM | POA: Diagnosis not present

## 2021-11-10 DIAGNOSIS — I252 Old myocardial infarction: Secondary | ICD-10-CM | POA: Diagnosis not present

## 2021-11-10 DIAGNOSIS — F17210 Nicotine dependence, cigarettes, uncomplicated: Secondary | ICD-10-CM | POA: Diagnosis not present

## 2021-11-10 DIAGNOSIS — Z8673 Personal history of transient ischemic attack (TIA), and cerebral infarction without residual deficits: Secondary | ICD-10-CM | POA: Diagnosis not present

## 2021-11-10 DIAGNOSIS — I4892 Unspecified atrial flutter: Secondary | ICD-10-CM | POA: Diagnosis not present

## 2021-11-10 DIAGNOSIS — J9811 Atelectasis: Secondary | ICD-10-CM | POA: Diagnosis not present

## 2021-11-10 DIAGNOSIS — E119 Type 2 diabetes mellitus without complications: Secondary | ICD-10-CM | POA: Diagnosis not present

## 2021-11-10 DIAGNOSIS — C3490 Malignant neoplasm of unspecified part of unspecified bronchus or lung: Secondary | ICD-10-CM | POA: Diagnosis not present

## 2021-11-10 DIAGNOSIS — E785 Hyperlipidemia, unspecified: Secondary | ICD-10-CM | POA: Diagnosis not present

## 2021-11-10 DIAGNOSIS — I2510 Atherosclerotic heart disease of native coronary artery without angina pectoris: Secondary | ICD-10-CM | POA: Diagnosis not present

## 2021-11-10 DIAGNOSIS — E1165 Type 2 diabetes mellitus with hyperglycemia: Secondary | ICD-10-CM | POA: Diagnosis not present

## 2021-11-10 DIAGNOSIS — Z951 Presence of aortocoronary bypass graft: Secondary | ICD-10-CM | POA: Diagnosis not present

## 2021-11-10 DIAGNOSIS — M5020 Other cervical disc displacement, unspecified cervical region: Secondary | ICD-10-CM | POA: Diagnosis not present

## 2021-11-10 DIAGNOSIS — I10 Essential (primary) hypertension: Secondary | ICD-10-CM | POA: Diagnosis not present

## 2021-11-10 DIAGNOSIS — Z79891 Long term (current) use of opiate analgesic: Secondary | ICD-10-CM | POA: Diagnosis not present

## 2021-11-10 DIAGNOSIS — D630 Anemia in neoplastic disease: Secondary | ICD-10-CM | POA: Diagnosis not present

## 2021-11-10 DIAGNOSIS — C7931 Secondary malignant neoplasm of brain: Secondary | ICD-10-CM | POA: Diagnosis not present

## 2021-11-10 DIAGNOSIS — Z9181 History of falling: Secondary | ICD-10-CM | POA: Diagnosis not present

## 2021-11-10 DIAGNOSIS — H40002 Preglaucoma, unspecified, left eye: Secondary | ICD-10-CM | POA: Diagnosis not present

## 2021-11-11 ENCOUNTER — Other Ambulatory Visit: Payer: Self-pay | Admitting: Oncology

## 2021-11-11 ENCOUNTER — Inpatient Hospital Stay (INDEPENDENT_AMBULATORY_CARE_PROVIDER_SITE_OTHER): Payer: Medicare Other | Admitting: Oncology

## 2021-11-11 ENCOUNTER — Telehealth: Payer: Self-pay | Admitting: Oncology

## 2021-11-11 ENCOUNTER — Encounter: Payer: Self-pay | Admitting: Oncology

## 2021-11-11 ENCOUNTER — Inpatient Hospital Stay: Payer: Medicare Other

## 2021-11-11 VITALS — BP 152/71 | HR 53 | Temp 97.9°F | Resp 18 | Ht 65.0 in | Wt 126.2 lb

## 2021-11-11 DIAGNOSIS — C7931 Secondary malignant neoplasm of brain: Secondary | ICD-10-CM | POA: Diagnosis not present

## 2021-11-11 DIAGNOSIS — Z7901 Long term (current) use of anticoagulants: Secondary | ICD-10-CM | POA: Diagnosis not present

## 2021-11-11 DIAGNOSIS — C3431 Malignant neoplasm of lower lobe, right bronchus or lung: Secondary | ICD-10-CM

## 2021-11-11 DIAGNOSIS — Z5112 Encounter for antineoplastic immunotherapy: Secondary | ICD-10-CM | POA: Diagnosis not present

## 2021-11-11 LAB — PROTIME-INR
INR: 2 — ABNORMAL HIGH (ref 0.8–1.2)
Prothrombin Time: 22.3 seconds — ABNORMAL HIGH (ref 11.4–15.2)

## 2021-11-11 LAB — APTT: aPTT: 37 seconds — ABNORMAL HIGH (ref 24–36)

## 2021-11-11 NOTE — Telephone Encounter (Signed)
Port Placement scheduled for 11/17/21; pt to be at the [facility] @ 8:45am   Instructions: Pt will need someone to stay with her throughout the entire procedure and someone to drive her home NPO after midnight the night before (11/16/21) Pt needs to hold off on 2 doses of Xarelto   Notified patient's daughter of the above information.

## 2021-11-12 ENCOUNTER — Encounter: Payer: Self-pay | Admitting: Oncology

## 2021-11-14 ENCOUNTER — Other Ambulatory Visit: Payer: Self-pay | Admitting: Pharmacist

## 2021-11-14 MED FILL — Pembrolizumab IV Soln 100 MG/4ML (25 MG/ML): INTRAVENOUS | Qty: 8 | Status: AC

## 2021-11-15 ENCOUNTER — Inpatient Hospital Stay: Payer: Medicare Other

## 2021-11-15 VITALS — BP 157/66 | HR 98 | Temp 98.8°F | Resp 20 | Ht 65.0 in | Wt 127.0 lb

## 2021-11-15 DIAGNOSIS — E785 Hyperlipidemia, unspecified: Secondary | ICD-10-CM | POA: Diagnosis not present

## 2021-11-15 DIAGNOSIS — M5020 Other cervical disc displacement, unspecified cervical region: Secondary | ICD-10-CM | POA: Diagnosis not present

## 2021-11-15 DIAGNOSIS — Z5112 Encounter for antineoplastic immunotherapy: Secondary | ICD-10-CM | POA: Diagnosis not present

## 2021-11-15 DIAGNOSIS — Z9049 Acquired absence of other specified parts of digestive tract: Secondary | ICD-10-CM | POA: Diagnosis not present

## 2021-11-15 DIAGNOSIS — E1165 Type 2 diabetes mellitus with hyperglycemia: Secondary | ICD-10-CM | POA: Diagnosis not present

## 2021-11-15 DIAGNOSIS — D630 Anemia in neoplastic disease: Secondary | ICD-10-CM | POA: Diagnosis not present

## 2021-11-15 DIAGNOSIS — M80052D Age-related osteoporosis with current pathological fracture, left femur, subsequent encounter for fracture with routine healing: Secondary | ICD-10-CM | POA: Diagnosis not present

## 2021-11-15 DIAGNOSIS — Z7984 Long term (current) use of oral hypoglycemic drugs: Secondary | ICD-10-CM | POA: Diagnosis not present

## 2021-11-15 DIAGNOSIS — I4892 Unspecified atrial flutter: Secondary | ICD-10-CM | POA: Diagnosis not present

## 2021-11-15 DIAGNOSIS — I10 Essential (primary) hypertension: Secondary | ICD-10-CM | POA: Diagnosis not present

## 2021-11-15 DIAGNOSIS — J9811 Atelectasis: Secondary | ICD-10-CM | POA: Diagnosis not present

## 2021-11-15 DIAGNOSIS — I480 Paroxysmal atrial fibrillation: Secondary | ICD-10-CM | POA: Diagnosis not present

## 2021-11-15 DIAGNOSIS — Z79891 Long term (current) use of opiate analgesic: Secondary | ICD-10-CM | POA: Diagnosis not present

## 2021-11-15 DIAGNOSIS — C3490 Malignant neoplasm of unspecified part of unspecified bronchus or lung: Secondary | ICD-10-CM | POA: Diagnosis not present

## 2021-11-15 DIAGNOSIS — I252 Old myocardial infarction: Secondary | ICD-10-CM | POA: Diagnosis not present

## 2021-11-15 DIAGNOSIS — Z9181 History of falling: Secondary | ICD-10-CM | POA: Diagnosis not present

## 2021-11-15 DIAGNOSIS — J9601 Acute respiratory failure with hypoxia: Secondary | ICD-10-CM | POA: Diagnosis not present

## 2021-11-15 DIAGNOSIS — I2510 Atherosclerotic heart disease of native coronary artery without angina pectoris: Secondary | ICD-10-CM | POA: Diagnosis not present

## 2021-11-15 DIAGNOSIS — Z951 Presence of aortocoronary bypass graft: Secondary | ICD-10-CM | POA: Diagnosis not present

## 2021-11-15 DIAGNOSIS — C3431 Malignant neoplasm of lower lobe, right bronchus or lung: Secondary | ICD-10-CM | POA: Diagnosis not present

## 2021-11-15 DIAGNOSIS — K5900 Constipation, unspecified: Secondary | ICD-10-CM | POA: Diagnosis not present

## 2021-11-15 DIAGNOSIS — Z7901 Long term (current) use of anticoagulants: Secondary | ICD-10-CM | POA: Diagnosis not present

## 2021-11-15 DIAGNOSIS — Z8673 Personal history of transient ischemic attack (TIA), and cerebral infarction without residual deficits: Secondary | ICD-10-CM | POA: Diagnosis not present

## 2021-11-15 DIAGNOSIS — C7931 Secondary malignant neoplasm of brain: Secondary | ICD-10-CM | POA: Diagnosis not present

## 2021-11-15 DIAGNOSIS — F17210 Nicotine dependence, cigarettes, uncomplicated: Secondary | ICD-10-CM | POA: Diagnosis not present

## 2021-11-15 MED ORDER — SODIUM CHLORIDE 0.9 % IV SOLN
200.0000 mg | Freq: Once | INTRAVENOUS | Status: AC
  Administered 2021-11-15: 200 mg via INTRAVENOUS
  Filled 2021-11-15: qty 8

## 2021-11-15 MED ORDER — SODIUM CHLORIDE 0.9% FLUSH: 10.0000 mL | INTRAVENOUS | Status: DC | PRN

## 2021-11-15 MED ORDER — SODIUM CHLORIDE 0.9 % IV SOLN: Freq: Once | INTRAVENOUS | Status: AC

## 2021-11-15 NOTE — Patient Instructions (Signed)
[facility]  Discharge Instructions: Thank you for choosing [facility] to provide your oncology and hematology care.  If you have a lab appointment with the [facility], please go directly to the [facility] and check in at the registration area.   Wear comfortable clothing and clothing appropriate for easy access to any Portacath or PICC line.   We strive to give you quality time with your provider. You may need to reschedule your appointment if you arrive late (15 or more minutes).  Arriving late affects you and other patients whose appointments are after yours.  Also, if you miss three or more appointments without notifying the office, you may be dismissed from the clinic at the provider's discretion.      For prescription refill requests, have your pharmacy contact our office and allow 72 hours for refills to be completed.    Today you received the following chemotherapy and/or immunotherapy agents:KeytrudaPembrolizumab injection What is this medication? PEMBROLIZUMAB (pem broe liz ue mab) is a monoclonal antibody. It is used to treat certain types of cancer. This medicine may be used for other purposes; ask your health care provider or pharmacist if you have questions. COMMON BRAND NAME(S): Keytruda What should I tell my care team before I take this medication? They need to know if you have any of these conditions: autoimmune diseases like Crohn's disease, ulcerative colitis, or lupus have had or planning to have an allogeneic stem cell transplant (uses someone else's stem cells) history of organ transplant history of chest radiation nervous system problems like myasthenia gravis or Guillain-Barre syndrome an unusual or allergic reaction to pembrolizumab, other medicines, foods, dyes, or preservatives pregnant or trying to get pregnant breast-feeding How should I use this medication? This medicine is for infusion into a vein. It is given by  a health care professional in a hospital or clinic setting. A special MedGuide will be given to you before each treatment. Be sure to read this information carefully each time. Talk to your pediatrician regarding the use of this medicine in children. While this drug may be prescribed for children as young as 6 months for selected conditions, precautions do apply. Overdosage: If you think you have taken too much of this medicine contact a poison control center or emergency room at once. NOTE: This medicine is only for you. Do not share this medicine with others. What if I miss a dose? It is important not to miss your dose. Call your doctor or health care professional if you are unable to keep an appointment. What may interact with this medication? Interactions have not been studied. This list may not describe all possible interactions. Give your health care provider a list of all the medicines, herbs, non-prescription drugs, or dietary supplements you use. Also tell them if you smoke, drink alcohol, or use illegal drugs. Some items may interact with your medicine. What should I watch for while using this medication? Your condition will be monitored carefully while you are receiving this medicine. You may need blood work done while you are taking this medicine. Do not become pregnant while taking this medicine or for 4 months after stopping it. Women should inform their doctor if they wish to become pregnant or think they might be pregnant. There is a potential for serious side effects to an unborn child. Talk to your health care professional or pharmacist for more information. Do not breast-feed an infant while taking this medicine or for 4  months after the last dose. What side effects may I notice from receiving this medication? Side effects that you should report to your doctor or health care professional as soon as possible: allergic reactions like skin rash, itching or hives, swelling of the face,  lips, or tongue bloody or black, tarry breathing problems changes in vision chest pain chills confusion constipation cough diarrhea dizziness or feeling faint or lightheaded fast or irregular heartbeat fever flushing joint pain low blood counts - this medicine may decrease the number of white blood cells, red blood cells and platelets. You may be at increased risk for infections and bleeding. muscle pain muscle weakness pain, tingling, numbness in the hands or feet persistent headache redness, blistering, peeling or loosening of the skin, including inside the mouth signs and symptoms of high blood sugar such as dizziness; dry mouth; dry skin; fruity breath; nausea; stomach pain; increased hunger or thirst; increased urination signs and symptoms of kidney injury like trouble passing urine or change in the amount of urine signs and symptoms of liver injury like dark urine, light-colored stools, loss of appetite, nausea, right upper belly pain, yellowing of the eyes or skin sweating swollen lymph nodes weight loss Side effects that usually do not require medical attention (report to your doctor or health care professional if they continue or are bothersome): decreased appetite hair loss tiredness This list may not describe all possible side effects. Call your doctor for medical advice about side effects. You may report side effects to FDA at 1-800-FDA-1088. Where should I keep my medication? This drug is given in a hospital or clinic and will not be stored at home. NOTE: This sheet is a summary. It may not cover all possible information. If you have questions about this medicine, talk to your doctor, pharmacist, or health care provider.  2023 Elsevier/Gold Standard (2021-05-13 00:00:00)       To help prevent nausea and vomiting after your treatment, we encourage you to take your nausea medication as directed.  BELOW ARE SYMPTOMS THAT SHOULD BE REPORTED IMMEDIATELY: *FEVER  GREATER THAN 100.4 F (38 C) OR HIGHER *CHILLS OR SWEATING *NAUSEA AND VOMITING THAT IS NOT CONTROLLED WITH YOUR NAUSEA MEDICATION *UNUSUAL SHORTNESS OF BREATH *UNUSUAL BRUISING OR BLEEDING *URINARY PROBLEMS (pain or burning when urinating, or frequent urination) *BOWEL PROBLEMS (unusual diarrhea, constipation, pain near the anus) TENDERNESS IN MOUTH AND THROAT WITH OR WITHOUT PRESENCE OF ULCERS (sore throat, sores in mouth, or a toothache) UNUSUAL RASH, SWELLING OR PAIN  UNUSUAL VAGINAL DISCHARGE OR ITCHING   Items with * indicate a potential emergency and should be followed up as soon as possible or go to the Emergency Department if any problems should occur.  Please show the CHEMOTHERAPY ALERT CARD or IMMUNOTHERAPY ALERT CARD at check-in to the Emergency Department and triage nurse.  Should you have questions after your visit or need to cancel or reschedule your appointment, please contact [facility]  Dept: [phone]  and follow the prompts.  Office hours are 8:00 a.m. to 4:30 p.m. Monday - Friday. Please note that voicemails left after 4:00 p.m. may not be returned until the following business day.  We are closed weekends and major holidays. You have access to a nurse at all times for urgent questions. Please call the main number to the clinic Dept: [phone] and follow the prompts.  For any non-urgent questions, you may also contact your provider using MyChart. We now offer e-Visits for anyone 66 and older to  request care online for non-urgent symptoms. For details visit [email or web address].   Also download the MyChart app! Go to the app store, search "MyChart", open the app, select [facility], and log in with your MyChart username and password.  Due to Covid, a mask is required upon entering the hospital/clinic. If you do not have a mask, one will be given to you upon arrival. For doctor visits, patients may have 1 support person aged 44 or older with  them. For treatment visits, patients cannot have anyone with them due to current Covid guidelines and our immunocompromised population.

## 2021-11-16 DIAGNOSIS — E785 Hyperlipidemia, unspecified: Secondary | ICD-10-CM | POA: Diagnosis not present

## 2021-11-16 DIAGNOSIS — M80052D Age-related osteoporosis with current pathological fracture, left femur, subsequent encounter for fracture with routine healing: Secondary | ICD-10-CM | POA: Diagnosis not present

## 2021-11-16 DIAGNOSIS — Z7901 Long term (current) use of anticoagulants: Secondary | ICD-10-CM | POA: Diagnosis not present

## 2021-11-16 DIAGNOSIS — M5020 Other cervical disc displacement, unspecified cervical region: Secondary | ICD-10-CM | POA: Diagnosis not present

## 2021-11-16 DIAGNOSIS — J9601 Acute respiratory failure with hypoxia: Secondary | ICD-10-CM | POA: Diagnosis not present

## 2021-11-16 DIAGNOSIS — Z79891 Long term (current) use of opiate analgesic: Secondary | ICD-10-CM | POA: Diagnosis not present

## 2021-11-16 DIAGNOSIS — Z7984 Long term (current) use of oral hypoglycemic drugs: Secondary | ICD-10-CM | POA: Diagnosis not present

## 2021-11-16 DIAGNOSIS — Z9049 Acquired absence of other specified parts of digestive tract: Secondary | ICD-10-CM | POA: Diagnosis not present

## 2021-11-16 DIAGNOSIS — Z9181 History of falling: Secondary | ICD-10-CM | POA: Diagnosis not present

## 2021-11-16 DIAGNOSIS — C7931 Secondary malignant neoplasm of brain: Secondary | ICD-10-CM | POA: Diagnosis not present

## 2021-11-16 DIAGNOSIS — E1165 Type 2 diabetes mellitus with hyperglycemia: Secondary | ICD-10-CM | POA: Diagnosis not present

## 2021-11-16 DIAGNOSIS — J9811 Atelectasis: Secondary | ICD-10-CM | POA: Diagnosis not present

## 2021-11-16 DIAGNOSIS — K5900 Constipation, unspecified: Secondary | ICD-10-CM | POA: Diagnosis not present

## 2021-11-16 DIAGNOSIS — Z8673 Personal history of transient ischemic attack (TIA), and cerebral infarction without residual deficits: Secondary | ICD-10-CM | POA: Diagnosis not present

## 2021-11-16 DIAGNOSIS — I10 Essential (primary) hypertension: Secondary | ICD-10-CM | POA: Diagnosis not present

## 2021-11-16 DIAGNOSIS — I4892 Unspecified atrial flutter: Secondary | ICD-10-CM | POA: Diagnosis not present

## 2021-11-16 DIAGNOSIS — Z951 Presence of aortocoronary bypass graft: Secondary | ICD-10-CM | POA: Diagnosis not present

## 2021-11-16 DIAGNOSIS — C3490 Malignant neoplasm of unspecified part of unspecified bronchus or lung: Secondary | ICD-10-CM | POA: Diagnosis not present

## 2021-11-16 DIAGNOSIS — I2510 Atherosclerotic heart disease of native coronary artery without angina pectoris: Secondary | ICD-10-CM | POA: Diagnosis not present

## 2021-11-16 DIAGNOSIS — D630 Anemia in neoplastic disease: Secondary | ICD-10-CM | POA: Diagnosis not present

## 2021-11-16 DIAGNOSIS — I480 Paroxysmal atrial fibrillation: Secondary | ICD-10-CM | POA: Diagnosis not present

## 2021-11-16 DIAGNOSIS — I252 Old myocardial infarction: Secondary | ICD-10-CM | POA: Diagnosis not present

## 2021-11-16 DIAGNOSIS — F17210 Nicotine dependence, cigarettes, uncomplicated: Secondary | ICD-10-CM | POA: Diagnosis not present

## 2021-11-17 ENCOUNTER — Encounter: Payer: Self-pay | Admitting: Oncology

## 2021-11-17 DIAGNOSIS — C3490 Malignant neoplasm of unspecified part of unspecified bronchus or lung: Secondary | ICD-10-CM | POA: Diagnosis not present

## 2021-11-17 DIAGNOSIS — Z452 Encounter for adjustment and management of vascular access device: Secondary | ICD-10-CM | POA: Diagnosis not present

## 2021-11-21 DIAGNOSIS — J9811 Atelectasis: Secondary | ICD-10-CM | POA: Diagnosis not present

## 2021-11-21 DIAGNOSIS — K5900 Constipation, unspecified: Secondary | ICD-10-CM | POA: Diagnosis not present

## 2021-11-21 DIAGNOSIS — M5020 Other cervical disc displacement, unspecified cervical region: Secondary | ICD-10-CM | POA: Diagnosis not present

## 2021-11-21 DIAGNOSIS — D630 Anemia in neoplastic disease: Secondary | ICD-10-CM | POA: Diagnosis not present

## 2021-11-21 DIAGNOSIS — I2510 Atherosclerotic heart disease of native coronary artery without angina pectoris: Secondary | ICD-10-CM | POA: Diagnosis not present

## 2021-11-21 DIAGNOSIS — Z8673 Personal history of transient ischemic attack (TIA), and cerebral infarction without residual deficits: Secondary | ICD-10-CM | POA: Diagnosis not present

## 2021-11-21 DIAGNOSIS — Z7984 Long term (current) use of oral hypoglycemic drugs: Secondary | ICD-10-CM | POA: Diagnosis not present

## 2021-11-21 DIAGNOSIS — Z79891 Long term (current) use of opiate analgesic: Secondary | ICD-10-CM | POA: Diagnosis not present

## 2021-11-21 DIAGNOSIS — F17210 Nicotine dependence, cigarettes, uncomplicated: Secondary | ICD-10-CM | POA: Diagnosis not present

## 2021-11-21 DIAGNOSIS — I4892 Unspecified atrial flutter: Secondary | ICD-10-CM | POA: Diagnosis not present

## 2021-11-21 DIAGNOSIS — E1165 Type 2 diabetes mellitus with hyperglycemia: Secondary | ICD-10-CM | POA: Diagnosis not present

## 2021-11-21 DIAGNOSIS — Z9049 Acquired absence of other specified parts of digestive tract: Secondary | ICD-10-CM | POA: Diagnosis not present

## 2021-11-21 DIAGNOSIS — Z9181 History of falling: Secondary | ICD-10-CM | POA: Diagnosis not present

## 2021-11-21 DIAGNOSIS — M80052D Age-related osteoporosis with current pathological fracture, left femur, subsequent encounter for fracture with routine healing: Secondary | ICD-10-CM | POA: Diagnosis not present

## 2021-11-21 DIAGNOSIS — I480 Paroxysmal atrial fibrillation: Secondary | ICD-10-CM | POA: Diagnosis not present

## 2021-11-21 DIAGNOSIS — Z951 Presence of aortocoronary bypass graft: Secondary | ICD-10-CM | POA: Diagnosis not present

## 2021-11-21 DIAGNOSIS — E785 Hyperlipidemia, unspecified: Secondary | ICD-10-CM | POA: Diagnosis not present

## 2021-11-21 DIAGNOSIS — I252 Old myocardial infarction: Secondary | ICD-10-CM | POA: Diagnosis not present

## 2021-11-21 DIAGNOSIS — C3490 Malignant neoplasm of unspecified part of unspecified bronchus or lung: Secondary | ICD-10-CM | POA: Diagnosis not present

## 2021-11-21 DIAGNOSIS — I10 Essential (primary) hypertension: Secondary | ICD-10-CM | POA: Diagnosis not present

## 2021-11-21 DIAGNOSIS — Z7901 Long term (current) use of anticoagulants: Secondary | ICD-10-CM | POA: Diagnosis not present

## 2021-11-21 DIAGNOSIS — C7931 Secondary malignant neoplasm of brain: Secondary | ICD-10-CM | POA: Diagnosis not present

## 2021-11-21 DIAGNOSIS — J9601 Acute respiratory failure with hypoxia: Secondary | ICD-10-CM | POA: Diagnosis not present

## 2021-11-22 ENCOUNTER — Other Ambulatory Visit: Payer: Self-pay | Admitting: Hematology and Oncology

## 2021-11-22 ENCOUNTER — Other Ambulatory Visit: Payer: Self-pay

## 2021-11-22 DIAGNOSIS — Z8673 Personal history of transient ischemic attack (TIA), and cerebral infarction without residual deficits: Secondary | ICD-10-CM | POA: Diagnosis not present

## 2021-11-22 DIAGNOSIS — Z9181 History of falling: Secondary | ICD-10-CM | POA: Diagnosis not present

## 2021-11-22 DIAGNOSIS — J9601 Acute respiratory failure with hypoxia: Secondary | ICD-10-CM | POA: Diagnosis not present

## 2021-11-22 DIAGNOSIS — I2510 Atherosclerotic heart disease of native coronary artery without angina pectoris: Secondary | ICD-10-CM | POA: Diagnosis not present

## 2021-11-22 DIAGNOSIS — M5020 Other cervical disc displacement, unspecified cervical region: Secondary | ICD-10-CM | POA: Diagnosis not present

## 2021-11-22 DIAGNOSIS — Z951 Presence of aortocoronary bypass graft: Secondary | ICD-10-CM | POA: Diagnosis not present

## 2021-11-22 DIAGNOSIS — M89411 Other hypertrophic osteoarthropathy, right shoulder: Secondary | ICD-10-CM

## 2021-11-22 DIAGNOSIS — G8929 Other chronic pain: Secondary | ICD-10-CM

## 2021-11-22 DIAGNOSIS — Z7901 Long term (current) use of anticoagulants: Secondary | ICD-10-CM | POA: Diagnosis not present

## 2021-11-22 DIAGNOSIS — Z7984 Long term (current) use of oral hypoglycemic drugs: Secondary | ICD-10-CM | POA: Diagnosis not present

## 2021-11-22 DIAGNOSIS — Z79891 Long term (current) use of opiate analgesic: Secondary | ICD-10-CM | POA: Diagnosis not present

## 2021-11-22 DIAGNOSIS — Z9049 Acquired absence of other specified parts of digestive tract: Secondary | ICD-10-CM | POA: Diagnosis not present

## 2021-11-22 DIAGNOSIS — E785 Hyperlipidemia, unspecified: Secondary | ICD-10-CM | POA: Diagnosis not present

## 2021-11-22 DIAGNOSIS — M80052D Age-related osteoporosis with current pathological fracture, left femur, subsequent encounter for fracture with routine healing: Secondary | ICD-10-CM | POA: Diagnosis not present

## 2021-11-22 DIAGNOSIS — D630 Anemia in neoplastic disease: Secondary | ICD-10-CM | POA: Diagnosis not present

## 2021-11-22 DIAGNOSIS — J9811 Atelectasis: Secondary | ICD-10-CM | POA: Diagnosis not present

## 2021-11-22 DIAGNOSIS — C7931 Secondary malignant neoplasm of brain: Secondary | ICD-10-CM | POA: Diagnosis not present

## 2021-11-22 DIAGNOSIS — C3490 Malignant neoplasm of unspecified part of unspecified bronchus or lung: Secondary | ICD-10-CM | POA: Diagnosis not present

## 2021-11-22 DIAGNOSIS — I480 Paroxysmal atrial fibrillation: Secondary | ICD-10-CM | POA: Diagnosis not present

## 2021-11-22 DIAGNOSIS — K5900 Constipation, unspecified: Secondary | ICD-10-CM | POA: Diagnosis not present

## 2021-11-22 DIAGNOSIS — F17210 Nicotine dependence, cigarettes, uncomplicated: Secondary | ICD-10-CM | POA: Diagnosis not present

## 2021-11-22 DIAGNOSIS — I4892 Unspecified atrial flutter: Secondary | ICD-10-CM | POA: Diagnosis not present

## 2021-11-22 DIAGNOSIS — I10 Essential (primary) hypertension: Secondary | ICD-10-CM | POA: Diagnosis not present

## 2021-11-22 DIAGNOSIS — I252 Old myocardial infarction: Secondary | ICD-10-CM | POA: Diagnosis not present

## 2021-11-22 DIAGNOSIS — C3431 Malignant neoplasm of lower lobe, right bronchus or lung: Secondary | ICD-10-CM

## 2021-11-22 DIAGNOSIS — E1165 Type 2 diabetes mellitus with hyperglycemia: Secondary | ICD-10-CM | POA: Diagnosis not present

## 2021-11-22 MED ORDER — OXYCODONE HCL 5 MG PO TABS: 10.0000 mg | ORAL_TABLET | ORAL | 0 refills | Status: DC | PRN

## 2021-11-22 MED ORDER — OXYCODONE HCL 10 MG PO TABS: 10.0000 mg | ORAL_TABLET | ORAL | 0 refills | Status: DC | PRN

## 2021-11-23 DIAGNOSIS — M5020 Other cervical disc displacement, unspecified cervical region: Secondary | ICD-10-CM | POA: Diagnosis not present

## 2021-11-23 DIAGNOSIS — I2510 Atherosclerotic heart disease of native coronary artery without angina pectoris: Secondary | ICD-10-CM | POA: Diagnosis not present

## 2021-11-23 DIAGNOSIS — I10 Essential (primary) hypertension: Secondary | ICD-10-CM | POA: Diagnosis not present

## 2021-11-23 DIAGNOSIS — E785 Hyperlipidemia, unspecified: Secondary | ICD-10-CM | POA: Diagnosis not present

## 2021-11-23 DIAGNOSIS — Z951 Presence of aortocoronary bypass graft: Secondary | ICD-10-CM | POA: Diagnosis not present

## 2021-11-23 DIAGNOSIS — J9601 Acute respiratory failure with hypoxia: Secondary | ICD-10-CM | POA: Diagnosis not present

## 2021-11-23 DIAGNOSIS — Z7984 Long term (current) use of oral hypoglycemic drugs: Secondary | ICD-10-CM | POA: Diagnosis not present

## 2021-11-23 DIAGNOSIS — Z9181 History of falling: Secondary | ICD-10-CM | POA: Diagnosis not present

## 2021-11-23 DIAGNOSIS — Z9049 Acquired absence of other specified parts of digestive tract: Secondary | ICD-10-CM | POA: Diagnosis not present

## 2021-11-23 DIAGNOSIS — D630 Anemia in neoplastic disease: Secondary | ICD-10-CM | POA: Diagnosis not present

## 2021-11-23 DIAGNOSIS — K5900 Constipation, unspecified: Secondary | ICD-10-CM | POA: Diagnosis not present

## 2021-11-23 DIAGNOSIS — I480 Paroxysmal atrial fibrillation: Secondary | ICD-10-CM | POA: Diagnosis not present

## 2021-11-23 DIAGNOSIS — C7931 Secondary malignant neoplasm of brain: Secondary | ICD-10-CM | POA: Diagnosis not present

## 2021-11-23 DIAGNOSIS — C3490 Malignant neoplasm of unspecified part of unspecified bronchus or lung: Secondary | ICD-10-CM | POA: Diagnosis not present

## 2021-11-23 DIAGNOSIS — F17210 Nicotine dependence, cigarettes, uncomplicated: Secondary | ICD-10-CM | POA: Diagnosis not present

## 2021-11-23 DIAGNOSIS — Z79891 Long term (current) use of opiate analgesic: Secondary | ICD-10-CM | POA: Diagnosis not present

## 2021-11-23 DIAGNOSIS — Z7901 Long term (current) use of anticoagulants: Secondary | ICD-10-CM | POA: Diagnosis not present

## 2021-11-23 DIAGNOSIS — I4892 Unspecified atrial flutter: Secondary | ICD-10-CM | POA: Diagnosis not present

## 2021-11-23 DIAGNOSIS — E1165 Type 2 diabetes mellitus with hyperglycemia: Secondary | ICD-10-CM | POA: Diagnosis not present

## 2021-11-23 DIAGNOSIS — M80052D Age-related osteoporosis with current pathological fracture, left femur, subsequent encounter for fracture with routine healing: Secondary | ICD-10-CM | POA: Diagnosis not present

## 2021-11-23 DIAGNOSIS — Z8673 Personal history of transient ischemic attack (TIA), and cerebral infarction without residual deficits: Secondary | ICD-10-CM | POA: Diagnosis not present

## 2021-11-23 DIAGNOSIS — I252 Old myocardial infarction: Secondary | ICD-10-CM | POA: Diagnosis not present

## 2021-11-23 DIAGNOSIS — J9811 Atelectasis: Secondary | ICD-10-CM | POA: Diagnosis not present

## 2021-11-24 DIAGNOSIS — Z8673 Personal history of transient ischemic attack (TIA), and cerebral infarction without residual deficits: Secondary | ICD-10-CM | POA: Diagnosis not present

## 2021-11-24 DIAGNOSIS — Z79891 Long term (current) use of opiate analgesic: Secondary | ICD-10-CM | POA: Diagnosis not present

## 2021-11-24 DIAGNOSIS — E1165 Type 2 diabetes mellitus with hyperglycemia: Secondary | ICD-10-CM | POA: Diagnosis not present

## 2021-11-24 DIAGNOSIS — C3490 Malignant neoplasm of unspecified part of unspecified bronchus or lung: Secondary | ICD-10-CM | POA: Diagnosis not present

## 2021-11-24 DIAGNOSIS — I252 Old myocardial infarction: Secondary | ICD-10-CM | POA: Diagnosis not present

## 2021-11-24 DIAGNOSIS — Z9181 History of falling: Secondary | ICD-10-CM | POA: Diagnosis not present

## 2021-11-24 DIAGNOSIS — I10 Essential (primary) hypertension: Secondary | ICD-10-CM | POA: Diagnosis not present

## 2021-11-24 DIAGNOSIS — D630 Anemia in neoplastic disease: Secondary | ICD-10-CM | POA: Diagnosis not present

## 2021-11-24 DIAGNOSIS — I2510 Atherosclerotic heart disease of native coronary artery without angina pectoris: Secondary | ICD-10-CM | POA: Diagnosis not present

## 2021-11-24 DIAGNOSIS — M80052D Age-related osteoporosis with current pathological fracture, left femur, subsequent encounter for fracture with routine healing: Secondary | ICD-10-CM | POA: Diagnosis not present

## 2021-11-24 DIAGNOSIS — Z7901 Long term (current) use of anticoagulants: Secondary | ICD-10-CM | POA: Diagnosis not present

## 2021-11-24 DIAGNOSIS — Z951 Presence of aortocoronary bypass graft: Secondary | ICD-10-CM | POA: Diagnosis not present

## 2021-11-24 DIAGNOSIS — F17210 Nicotine dependence, cigarettes, uncomplicated: Secondary | ICD-10-CM | POA: Diagnosis not present

## 2021-11-24 DIAGNOSIS — M5020 Other cervical disc displacement, unspecified cervical region: Secondary | ICD-10-CM | POA: Diagnosis not present

## 2021-11-24 DIAGNOSIS — J9601 Acute respiratory failure with hypoxia: Secondary | ICD-10-CM | POA: Diagnosis not present

## 2021-11-24 DIAGNOSIS — Z9049 Acquired absence of other specified parts of digestive tract: Secondary | ICD-10-CM | POA: Diagnosis not present

## 2021-11-24 DIAGNOSIS — J9811 Atelectasis: Secondary | ICD-10-CM | POA: Diagnosis not present

## 2021-11-24 DIAGNOSIS — E785 Hyperlipidemia, unspecified: Secondary | ICD-10-CM | POA: Diagnosis not present

## 2021-11-24 DIAGNOSIS — I480 Paroxysmal atrial fibrillation: Secondary | ICD-10-CM | POA: Diagnosis not present

## 2021-11-24 DIAGNOSIS — Z7984 Long term (current) use of oral hypoglycemic drugs: Secondary | ICD-10-CM | POA: Diagnosis not present

## 2021-11-24 DIAGNOSIS — C7931 Secondary malignant neoplasm of brain: Secondary | ICD-10-CM | POA: Diagnosis not present

## 2021-11-24 DIAGNOSIS — I4892 Unspecified atrial flutter: Secondary | ICD-10-CM | POA: Diagnosis not present

## 2021-11-24 DIAGNOSIS — K5900 Constipation, unspecified: Secondary | ICD-10-CM | POA: Diagnosis not present

## 2021-12-01 ENCOUNTER — Encounter: Payer: Self-pay | Admitting: Oncology

## 2021-12-01 DIAGNOSIS — Z9181 History of falling: Secondary | ICD-10-CM | POA: Diagnosis not present

## 2021-12-01 DIAGNOSIS — Z8673 Personal history of transient ischemic attack (TIA), and cerebral infarction without residual deficits: Secondary | ICD-10-CM | POA: Diagnosis not present

## 2021-12-01 DIAGNOSIS — F17210 Nicotine dependence, cigarettes, uncomplicated: Secondary | ICD-10-CM | POA: Diagnosis not present

## 2021-12-01 DIAGNOSIS — I2510 Atherosclerotic heart disease of native coronary artery without angina pectoris: Secondary | ICD-10-CM | POA: Diagnosis not present

## 2021-12-01 DIAGNOSIS — Z951 Presence of aortocoronary bypass graft: Secondary | ICD-10-CM | POA: Diagnosis not present

## 2021-12-01 DIAGNOSIS — I10 Essential (primary) hypertension: Secondary | ICD-10-CM | POA: Diagnosis not present

## 2021-12-01 DIAGNOSIS — Z7984 Long term (current) use of oral hypoglycemic drugs: Secondary | ICD-10-CM | POA: Diagnosis not present

## 2021-12-01 DIAGNOSIS — Z7901 Long term (current) use of anticoagulants: Secondary | ICD-10-CM | POA: Diagnosis not present

## 2021-12-01 DIAGNOSIS — M80052D Age-related osteoporosis with current pathological fracture, left femur, subsequent encounter for fracture with routine healing: Secondary | ICD-10-CM | POA: Diagnosis not present

## 2021-12-01 DIAGNOSIS — C3490 Malignant neoplasm of unspecified part of unspecified bronchus or lung: Secondary | ICD-10-CM | POA: Diagnosis not present

## 2021-12-01 DIAGNOSIS — I4892 Unspecified atrial flutter: Secondary | ICD-10-CM | POA: Diagnosis not present

## 2021-12-01 DIAGNOSIS — C7931 Secondary malignant neoplasm of brain: Secondary | ICD-10-CM | POA: Diagnosis not present

## 2021-12-01 DIAGNOSIS — I252 Old myocardial infarction: Secondary | ICD-10-CM | POA: Diagnosis not present

## 2021-12-01 DIAGNOSIS — E1165 Type 2 diabetes mellitus with hyperglycemia: Secondary | ICD-10-CM | POA: Diagnosis not present

## 2021-12-01 DIAGNOSIS — K5900 Constipation, unspecified: Secondary | ICD-10-CM | POA: Diagnosis not present

## 2021-12-01 DIAGNOSIS — E785 Hyperlipidemia, unspecified: Secondary | ICD-10-CM | POA: Diagnosis not present

## 2021-12-01 DIAGNOSIS — I480 Paroxysmal atrial fibrillation: Secondary | ICD-10-CM | POA: Diagnosis not present

## 2021-12-01 DIAGNOSIS — J9811 Atelectasis: Secondary | ICD-10-CM | POA: Diagnosis not present

## 2021-12-01 DIAGNOSIS — J9601 Acute respiratory failure with hypoxia: Secondary | ICD-10-CM | POA: Diagnosis not present

## 2021-12-01 DIAGNOSIS — M5020 Other cervical disc displacement, unspecified cervical region: Secondary | ICD-10-CM | POA: Diagnosis not present

## 2021-12-01 DIAGNOSIS — D630 Anemia in neoplastic disease: Secondary | ICD-10-CM | POA: Diagnosis not present

## 2021-12-01 DIAGNOSIS — Z9049 Acquired absence of other specified parts of digestive tract: Secondary | ICD-10-CM | POA: Diagnosis not present

## 2021-12-01 DIAGNOSIS — Z79891 Long term (current) use of opiate analgesic: Secondary | ICD-10-CM | POA: Diagnosis not present

## 2021-12-01 NOTE — Progress Notes (Deleted)
Patient Care Team: Townsend Roger, MD as PCP - General (Internal Medicine) Sharmon Revere as Physician Assistant (Cardiology)  Clinic Day:  12/01/2021  Referring physician: Townsend Roger, MD  ASSESSMENT & PLAN:   Assessment & Plan: Lung cancer, lower lobe [facility]) She received 1 dose of chemotherapy in June 2022, with combination cisplatin/Taxol/pembrolizumab, and tolerated this very poorly and ended up in the hospital twice. She is now receiving palliative pembrolizumab since October, and tolerating well. CT chest from January 2023 is stable to mildly improved. We will plan to rescan her after another 3 months. She is tolerating pembrolizumab well. She will proceed with cycle 7 Monday. She did hurt her back trying to move her couch last week. I will refill her pain medication and advised not to engage in any heavy lifting. She will return to clinic in 3 weeks for repeat evaluation.   Brain metastases [facility]) Brain metastases diagnosed in April 2022.  This presented with right sided head pain and pain of the right eye.  She received stereotactic radiation to the two brain lesions. MRI brain from September 26th revealed four metastatic deposits in the brain. Several show mild associated hemorrhage. Mild edema. No mass-effect or midline shift. We will continue to monitor these. MRI brain from January revealed a mixed response with one new enhancing lesion in the right cerebellar hemisphere measuring 3 mm. One lesion in the left frontal lobe has resolved, one lesion in the right occipital lobe is stable, and two lesions are mildly decreased in size. We will rescan in early May.   Shoulder pain, right Worsening right shoulder pain. 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 Right shoulder x-ray revealed diffusely decreased mineralization of the bones. No acute fracture or dislocation. Hypertrophic degenerative changes present at the acromioclavicular joint. Mild  degenerative changes at the glenohumeral joint. Sternotomy wires are noted over the midline. The soft tissues are within normal limits.I will increase her oxycodone today to 10 mg as she states the 5 mg is not relieving her pain.        The patient understands the plans discussed today and is in agreement with them.  She knows to contact our office if she develops concerns prior to her next appointment.    Derwood Kaplan, MD  [place] [address] [place] [address] Dept: [phone] Dept Fax: [phone]   No orders of the defined types were placed in this encounter.     CHIEF COMPLAINT:  CC: A 76 year old female with history of lung cancer here for 3 week evaluation  Current Treatment:  Pembrolizumab  INTERVAL HISTORY:  [name] is here today for repeat clinical assessment. She denies fevers or chills. She denies pain. Her appetite is good. Her weight has decreased 4 pounds over last 3 weeks .  I have reviewed the past medical history, past surgical history, social history and family history with the patient and they are unchanged from previous note.  ALLERGIES:  is allergic to ergocalciferol.  MEDICATIONS:  Current Outpatient Medications  Medication Sig Dispense Refill   ALPRAZolam (XANAX) 0.5 MG tablet Take 1 tablet (0.5 mg total) by mouth 3 (three) times daily as needed for anxiety. 90 tablet 0   atorvastatin (LIPITOR) 80 MG tablet Take 80 mg by mouth daily.     brimonidine (ALPHAGAN) 0.2 % ophthalmic solution in the morning and at bedtime. (Patient not taking:  Reported on 04/18/2021)     brimonidine-timolol (COMBIGAN) 0.2-0.5 % ophthalmic solution Place 1 drop into the right eye 2 (two) times daily. (Patient not taking: Reported on 04/18/2021)     dexamethasone (DECADRON) 4 MG tablet Take 4 mg by mouth daily as needed.     gabapentin (NEURONTIN) 100 MG capsule Take by mouth.     glipiZIDE  (GLUCOTROL XL) 5 MG 24 hr tablet Take 5 mg by mouth daily.     HUMULIN R 100 UNIT/ML injection      JANUVIA 100 MG tablet Take 100 mg by mouth daily.     LORazepam (ATIVAN) 0.5 MG tablet Take 1 tablet (0.5 mg total) by mouth at bedtime. 30 tablet 0   losartan (COZAAR) 25 MG tablet Take 25 mg by mouth daily.     metoprolol tartrate (LOPRESSOR) 25 MG tablet Take 25 mg by mouth 2 (two) times daily.     mupirocin cream (BACTROBAN) 2 % Apply topically.     ofloxacin (OCUFLOX) 0.3 % ophthalmic solution Place into the left eye. (Patient not taking: Reported on 04/18/2021)     ondansetron (ZOFRAN) 4 MG tablet Take 1 tablet (4 mg total) by mouth every 4 (four) hours as needed for nausea. 90 tablet 3   oxyCODONE (OXY IR/ROXICODONE) 5 MG immediate release tablet Take 2 tablets (10 mg total) by mouth every 4 (four) hours as needed for severe pain. 120 tablet 0   Oxycodone HCl 10 MG TABS Take 1 tablet (10 mg total) by mouth every 4 (four) hours as needed. 120 tablet 0   prednisoLONE acetate (PRED FORTE) 1 % ophthalmic suspension Place into the left eye.     prochlorperazine (COMPAZINE) 10 MG tablet Take 1 tablet (10 mg total) by mouth every 6 (six) hours as needed for nausea or vomiting. 90 tablet 3   rivaroxaban (XARELTO) 20 MG TABS tablet Take 20 mg by mouth daily with supper.     No current facility-administered medications for this visit.    HISTORY OF PRESENT ILLNESS:   Oncology History  Lung cancer, lower lobe ([place])  07/07/2020 Initial Diagnosis   Lung cancer, lower lobe ([place])   10/15/2020 Cancer Staging   Staging form: Lung, AJCC 8th Edition - Clinical stage from 10/15/2020: Stage IV (cT1b, cN0, cM1) - Signed by Derwood Kaplan, MD on 03/31/2021 Histopathologic type: Squamous cell carcinoma, NOS Stage prefix: Initial diagnosis Histologic grade (G): G3 Histologic grading system: 4 grade system Laterality: Right Tumor size (mm): 18 Sites of metastasis: Brain Lymph-vascular invasion (LVI):  Presence of LVI unknown/indeterminate Diagnostic confirmation: Positive histology Specimen type: Core Needle Biopsy Staged by: Managing physician Type of lung cancer: Locally advanced or metastatic non-small cell lung cancer Stage used in treatment planning: Yes National guidelines used in treatment planning: Yes Type of national guideline used in treatment planning: NCCN   04/08/2021 -  Chemotherapy   Patient is on Treatment Plan : LUNG NSCLC flat dose Pembrolizumab Q21D         REVIEW OF SYSTEMS:   Constitutional: Denies fevers, chills or abnormal weight loss Eyes: Denies blurriness of vision Ears, nose, mouth, throat, and face: Denies mucositis or sore throat Respiratory: Denies cough, dyspnea or wheezes Cardiovascular: Denies palpitation, chest discomfort or lower extremity swelling Gastrointestinal:  Denies nausea, heartburn or change in bowel habits Skin: Denies abnormal skin rashes Lymphatics: Denies new lymphadenopathy or easy bruising Neurological:Denies numbness, tingling or new weaknesses Behavioral/Psych: Mood is stable, no new changes  All other systems were reviewed  with the patient and are negative.   VITALS:  There were no vitals taken for this visit.  Wt Readings from Last 3 Encounters:  11/15/21 127 lb (57.6 kg)  11/11/21 126 lb 3.2 oz (57.2 kg)  10/28/21 126 lb 11.2 oz (57.5 kg)    There is no height or weight on file to calculate BMI.  Performance status (ECOG): 1 - Symptomatic but completely ambulatory  PHYSICAL EXAM:   GENERAL:alert, no distress and comfortable SKIN: skin color, texture, turgor are normal, no rashes or significant lesions EYES: normal, Conjunctiva are pink and non-injected, sclera clear OROPHARYNX:no exudate, no erythema and lips, buccal mucosa, and tongue normal  NECK: supple, thyroid normal size, non-tender, without nodularity LYMPH:  no palpable lymphadenopathy in the cervical, axillary or inguinal LUNGS: clear to auscultation  and percussion with normal breathing effort HEART: regular rate & rhythm and no murmurs and no lower extremity edema ABDOMEN:abdomen soft, non-tender and normal bowel sounds Musculoskeletal:no cyanosis of digits and no clubbing  NEURO: alert & oriented x 3 with fluent speech, no focal motor/sensory deficits  LABORATORY DATA:  I have reviewed the data as listed    Component Value Date/Time   NA 137 09/09/2021 0000   K 3.5 09/09/2021 0000   CL 102 09/09/2021 0000   CO2 26 (A) 09/09/2021 0000   GLUCOSE 83 10/20/2016 1618   BUN 17 09/09/2021 0000   CREATININE 0.9 09/09/2021 0000   CREATININE 1.07 (H) 10/20/2016 1618   CALCIUM 9.4 09/09/2021 0000   PROT 6.7 08/26/2010 0051   ALBUMIN 4.1 09/09/2021 0000   AST 25 09/09/2021 0000   ALT 21 09/09/2021 0000   ALKPHOS 109 09/09/2021 0000   BILITOT 0.5 08/26/2010 0051   GFRNONAA 51 (L) 10/20/2016 1618   GFRAA 59 (L) 10/20/2016 1618    No results found for: "SPEP", "UPEP"  Lab Results  Component Value Date   WBC 8.2 09/09/2021   NEUTROABS 6.07 09/09/2021   HGB 12.3 09/09/2021   HCT 39 09/09/2021   MCV 89 06/10/2021   PLT 249 09/09/2021      Chemistry      Component Value Date/Time   NA 137 09/09/2021 0000   K 3.5 09/09/2021 0000   CL 102 09/09/2021 0000   CO2 26 (A) 09/09/2021 0000   BUN 17 09/09/2021 0000   CREATININE 0.9 09/09/2021 0000   CREATININE 1.07 (H) 10/20/2016 1618   GLU 255 09/09/2021 0000      Component Value Date/Time   CALCIUM 9.4 09/09/2021 0000   ALKPHOS 109 09/09/2021 0000   AST 25 09/09/2021 0000   ALT 21 09/09/2021 0000   BILITOT 0.5 08/26/2010 0051       RADIOGRAPHIC STUDIES: I have personally reviewed the radiological images as listed and agreed with the findings in the report. No results found.

## 2021-12-01 NOTE — Progress Notes (Signed)
Patient Care Team: Townsend Roger, MD as PCP - General (Internal Medicine) Sharmon Revere as Physician Assistant (Cardiology)  Clinic Day: 12/02/21  Referring physician: Townsend Roger, MD  ASSESSMENT & PLAN:   Assessment & Plan: Lung cancer, lower lobe [facility]) She received 1 dose of chemotherapy in June 2022, with combination cisplatin/Taxol/pembrolizumab, and tolerated this very poorly and ended up in the hospital twice. She is now receiving palliative pembrolizumab since October, and tolerating well. CT chest from January 2023 is stable to mildly improved.  The patient took a break and went to [place] for a time and so he missed some doses.  She had repeat scans in May and all appears stable so we resumed treatment with pembrolizumab every 3 weeks.  She has now had a port placement as she realizes this is long-term therapy   Brain metastases [facility]) Brain metastases diagnosed in April 2022.  This presented with right sided head pain and pain of the right eye.  She received stereotactic radiation to the two brain lesions. MRI brain from September 26th revealed four metastatic deposits in the brain. Several show mild associated hemorrhage. Mild edema. No mass-effect or midline shift. We will continue to monitor these. MRI brain from January revealed a mixed response with one new enhancing lesion in the right cerebellar hemisphere measuring 3 mm. One lesion in the left frontal lobe has resolved, one lesion in the right occipital lobe is stable, and two lesions are mildly decreased in size.  Repeat scan in May shows resolution of the right cerebellar lesion and decreased size of the right occipital and bilateral frontal lesions   Shoulder pain, right Worsening right shoulder pain. 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 Right shoulder x-ray revealed diffusely decreased mineralization of the bones. No acute fracture or dislocation. Hypertrophic degenerative  changes present at the acromioclavicular joint. Mild degenerative changes at the glenohumeral joint. Sternotomy wires are noted over the midline. The soft tissues are within normal limits.  Mild anemia Her hemoglobin is stable at 11.4.  If this persists, we will pursue an anemia evaluation with additional labs.  I encouraged her to try the Remeron to see if this will help her appetite.   We are encouraged by the stable to improved appearance of her scans in May so she resumed pembrolizumab.  She actually discussed smoking cessation and whether a nicotine patch would help.  We will encourage her to pursue that.  She currently smokes 1-1/2 packs/day so would need a fairly high dose of nicotine patch to start with.  I have encouraged her to try the Remeron for her appetite.  We will see her back in 3 weeks with CBC and comprehensive metabolic profile prior to her next dose.The patient understands the plans discussed today and is in agreement with them.  She knows to contact our office if she develops concerns prior to her next appointment.    Derwood Kaplan, MD  [place] [address] [place] [address] Dept: [phone] Dept Fax: [phone]      CHIEF COMPLAINT:  CC: A 76 year old female with history of lung cancer here for 3 week evaluation  Current Treatment:  Pembrolizumab  INTERVAL HISTORY:  Elaf is here today for repeat clinical assessment.  She had relocated to [place] in April and returned in May.  She was off treatment during this time and so we decided  to repeat scans.  The CT of the chest shows stability with an unchanged spiculated nodule of the right lower lobe measuring 1.9 cm with multiple additional irregular nodules of the right lower lobe, also unchanged.  The MRI of the brain shows resolution of the right cerebellar lesion and decrease in size of the right occipital lesion and the 2 bilateral  frontal lobe lesions these were already tiny on the last scan and so continue to improve.  She still has pain of her left hip where she had a fracture but is recuperating fairly well.  She was finally willing to have a port placed as she realizes she will be on this therapy long-term her labs show her hemoglobin is stable at 11.4 with an MCV of 87 and normal white count, normal platelet count.  CMP is normal other than a nonfasting blood sugar of 151.  Thyroid function remains normal.  She denies fevers or chills. She denies pain. Her appetite is good. Her weight is down 1-1/2 pounds.  I have reviewed the past medical history, past surgical history, social history and family history with the patient and they are unchanged from previous note.  ALLERGIES:  is allergic to ergocalciferol.  MEDICATIONS:  Current Outpatient Medications  Medication Sig Dispense Refill   ALPRAZolam (XANAX) 0.5 MG tablet Take 1 tablet (0.5 mg total) by mouth 3 (three) times daily as needed for anxiety. 90 tablet 0   atorvastatin (LIPITOR) 80 MG tablet Take 80 mg by mouth daily.     brimonidine (ALPHAGAN) 0.2 % ophthalmic solution in the morning and at bedtime. (Patient not taking: Reported on 04/18/2021)     brimonidine-timolol (COMBIGAN) 0.2-0.5 % ophthalmic solution Place 1 drop into the right eye 2 (two) times daily. (Patient not taking: Reported on 04/18/2021)     dexamethasone (DECADRON) 4 MG tablet Take 4 mg by mouth daily as needed.     gabapentin (NEURONTIN) 100 MG capsule Take by mouth.     glipiZIDE (GLUCOTROL XL) 5 MG 24 hr tablet Take 5 mg by mouth daily.     HUMULIN R 100 UNIT/ML injection      JANUVIA 100 MG tablet Take 100 mg by mouth daily.     LORazepam (ATIVAN) 0.5 MG tablet Take 1 tablet (0.5 mg total) by mouth at bedtime. 30 tablet 0   losartan (COZAAR) 25 MG tablet Take 25 mg by mouth daily.     metoprolol tartrate (LOPRESSOR) 25 MG tablet Take 25 mg by mouth 2 (two) times daily.     mupirocin cream  (BACTROBAN) 2 % Apply topically.     ofloxacin (OCUFLOX) 0.3 % ophthalmic solution Place into the left eye. (Patient not taking: Reported on 04/18/2021)     ondansetron (ZOFRAN) 4 MG tablet Take 1 tablet (4 mg total) by mouth every 4 (four) hours as needed for nausea. 90 tablet 3   oxyCODONE (OXY IR/ROXICODONE) 5 MG immediate release tablet Take 2 tablets (10 mg total) by mouth every 4 (four) hours as needed for severe pain. 120 tablet 0   prednisoLONE acetate (PRED FORTE) 1 % ophthalmic suspension Place into the left eye.     prochlorperazine (COMPAZINE) 10 MG tablet Take 1 tablet (10 mg total) by mouth every 6 (six) hours as needed for nausea or vomiting. 90 tablet 3   rivaroxaban (XARELTO) 20 MG TABS tablet Take 20 mg by mouth daily with supper.     No current facility-administered medications for this visit.    HISTORY OF  PRESENT ILLNESS:   Oncology History  Lung cancer, lower lobe ([place])  07/07/2020 Initial Diagnosis   Lung cancer, lower lobe ([place])   10/15/2020 Cancer Staging   Staging form: Lung, AJCC 8th Edition - Clinical stage from 10/15/2020: Stage IV (cT1b, cN0, cM1) - Signed by Derwood Kaplan, MD on 03/31/2021 Histopathologic type: Squamous cell carcinoma, NOS Stage prefix: Initial diagnosis Histologic grade (G): G3 Histologic grading system: 4 grade system Laterality: Right Tumor size (mm): 18 Sites of metastasis: Brain Lymph-vascular invasion (LVI): Presence of LVI unknown/indeterminate Diagnostic confirmation: Positive histology Specimen type: Core Needle Biopsy Staged by: Managing physician Type of lung cancer: Locally advanced or metastatic non-small cell lung cancer Stage used in treatment planning: Yes National guidelines used in treatment planning: Yes Type of national guideline used in treatment planning: NCCN   04/08/2021 -  Chemotherapy   Patient is on Treatment Plan : LUNG NSCLC flat dose Pembrolizumab Q21D         REVIEW OF SYSTEMS:    Constitutional: Denies fevers, chills or abnormal weight loss Her appetite is poor. Eyes: Denies blurriness of vision Ears, nose, mouth, throat, and face: Denies mucositis or sore throat Respiratory: Denies cough, dyspnea or wheezes Cardiovascular: Denies palpitation, chest discomfort or lower extremity swelling Gastrointestinal:  Denies nausea, heartburn or change in bowel habits Skin: Denies abnormal skin rashes Lymphatics: Denies new lymphadenopathy or easy bruising Neurological:Denies numbness, tingling or new weaknesses Behavioral/Psych: Mood is stable, no new changes  All other systems were reviewed with the patient and are negative.   VITALS:  Blood pressure (!) 168/76, pulse 60, temperature (!) 97.4 F (36.3 C), temperature source Oral, resp. rate 16, height 5' 5.9" (1.674 m), weight 125 lb 8 oz (56.9 kg), SpO2 96 %.  Wt Readings from Last 3 Encounters:  12/26/21 127 lb 1.3 oz (57.6 kg)  12/23/21 127 lb 6.4 oz (57.8 kg)  12/06/21 129 lb 1.3 oz (58.6 kg)    Body mass index is 20.32 kg/m.  Performance status (ECOG): 1 - Symptomatic but completely ambulatory  PHYSICAL EXAM:   GENERAL:alert, no distress and comfortable SKIN: skin color, texture, turgor are normal, no rashes or significant lesions EYES: normal, Conjunctiva are pink and non-injected, sclera clear OROPHARYNX:no exudate, no erythema and lips, buccal mucosa, and tongue normal  NECK: supple, thyroid normal size, non-tender, without nodularity LYMPH:  no palpable lymphadenopathy in the cervical, axillary or inguinal LUNGS: clear to auscultation and percussion with normal breathing effort HEART: irregular rhythm with bradycardia and no murmurs and no lower extremity edema ABDOMEN:abdomen soft, non-tender and normal bowel sounds Musculoskeletal:no cyanosis of digits and no clubbing  NEURO: alert & oriented x 3 with fluent speech, no focal motor/sensory deficits  LABORATORY DATA:  I have reviewed the data as  listed    Component Value Date/Time   NA 139 12/23/2021 0000   K 4.2 12/23/2021 0000   CL 104 12/23/2021 0000   CO2 28 (A) 12/23/2021 0000   GLUCOSE 83 10/20/2016 1618   BUN 13 12/23/2021 0000   CREATININE 0.8 12/23/2021 0000   CREATININE 1.07 (H) 10/20/2016 1618   CALCIUM 9.2 12/23/2021 0000   PROT 6.7 08/26/2010 0051   ALBUMIN 4.1 12/23/2021 0000   AST 20 12/23/2021 0000   ALT 15 12/23/2021 0000   ALKPHOS 120 12/23/2021 0000   BILITOT 0.5 08/26/2010 0051   GFRNONAA 51 (L) 10/20/2016 1618   GFRAA 59 (L) 10/20/2016 1618    No results found for: "SPEP", "UPEP"  Lab Results  Component Value Date   WBC 6.0 12/23/2021   NEUTROABS 3.54 12/23/2021   HGB 11.5 (A) 12/23/2021   HCT 36 12/23/2021   MCV 87 12/02/2021   PLT 195 12/23/2021      Chemistry      Component Value Date/Time   NA 139 12/23/2021 0000   K 4.2 12/23/2021 0000   CL 104 12/23/2021 0000   CO2 28 (A) 12/23/2021 0000   BUN 13 12/23/2021 0000   CREATININE 0.8 12/23/2021 0000   CREATININE 1.07 (H) 10/20/2016 1618   GLU 276 12/23/2021 0000      Component Value Date/Time   CALCIUM 9.2 12/23/2021 0000   ALKPHOS 120 12/23/2021 0000   AST 20 12/23/2021 0000   ALT 15 12/23/2021 0000   BILITOT 0.5 08/26/2010 0051       RADIOGRAPHIC STUDIES: I have personally reviewed the radiological images as listed and agreed with the findings in the report.    EXAM: 11/07/21 MRI HEAD WITHOUT AND WITH CONTRAST  TECHNIQUE:  Multiplanar, multiecho pulse sequences of the brain and surrounding  structures were obtained without and with intravenous contrast.  COMPARISON: MRI of the brain June 29, 2021.  FINDINGS:  The study is degraded by motion.  Brain: No new enhancing lesion identified.  Inferior right cerebellar hemisphere lesion described on prior MRI  is no longer seen.  Interval decreased size of enhancing lesion in the right occipital  lobe, now measuring 4 mm compared to 6 mm on prior (series 11, image   106).  Punctate residual focus of contrast enhancement with hemosiderin  deposit in the left frontal operculum, now measuring 1 mm compared  to 2 mm on prior (series 11, image 122).  Cortical focus of residual contrast enhancement with hemosiderin  deposit in the posterior right frontal lobe, now measuring 4 mm  compared to 5 mm on prior (series 11, image 158).  No acute infarction, hemorrhage, hydrocephalus or extra-axial  collection. 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  Scattered and confluent foci of T2 hyperintensity are seen within  the white matter of the cerebral hemispheres are nonspecific, most  likely related to chronic small vessel ischemia.  Vascular: Normal flow voids.  Skull and upper cervical spine: Normal marrow signal. Sinuses/Orbits: Negative.  Other: None.  IMPRESSION:  Findings consistent with treatment response with resolution of right  cerebella lesion described on prior MRI and decrease in size of  right occipital and bilateral frontal lesions, as described above.  No new enhancing lesion identified.   Electronically Signed  By: Pedro Earls M.D.  On: 11/08/2021 15:25     EXAM: 11/07/21 CT CHEST WITHOUT CONTRAST  TECHNIQUE:  Multidetector CT imaging of the chest was performed following the  standard protocol without IV contrast.  RADIATION DOSE REDUCTION: This exam was performed according to the  departmental dose-optimization program which includes automated  exposure control, adjustment of the mA and/or kV according to  patient size and/or use of iterative reconstruction technique.  COMPARISON: 06/29/2021  FINDINGS:  Cardiovascular: Aortic atherosclerosis. Normal heart size. 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 No pericardial effusion.  Mediastinum/Nodes: No enlarged mediastinal,  hilar, or axillary lymph  nodes. Thyroid gland, trachea, and esophagus demonstrate no  significant findings.  Lungs/Pleura: Unchanged spiculated nodule of the superior segment  right lower lobe measuring 1.9 x 1.6 cm (series 301, image 62).  Multiple additional  generally clustered, irregular nodules of the  right lower lobe are likewise unchanged, most notably a 0.7 cm  nodule of the peripheral subpleural right lower lobe (series 301,  image 71). Background of extensive, fine centrilobular nodularity  throughout, generally concentrated in the lung apices. No pleural  effusion or pneumothorax.  Upper Abdomen: No acute abnormality. Stable, benign fatty  attenuation left adrenal adenoma (series 2, image 117). Status post  cholecystectomy.  Musculoskeletal: No chest wall abnormality. No suspicious osseous  lesions identified.  IMPRESSION:  1. Unchanged spiculated nodule of the superior segment right lower  lobe measuring 1.9 x 1.6 cm.  2. Multiple additional generally clustered, irregular nodules of the  right lower lobe are likewise unchanged, measuring 0.7 cm and  smaller.  3. Background of extensive, fine centrilobular nodularity  throughout, concentrated in the lung apices, consistent with  smoking-related respiratory bronchiolitis.  4. Coronary artery disease.  5. Stable, benign fatty attenuation left adrenal adenoma. This  demonstrates internal macroscopic fatty attenuation and has been  stable greater than 5 years.  Aortic Atherosclerosis (ICD10-I70.0).   Electronically Signed  By: Delanna Ahmadi M.D.  On: 11/08/2021 15:09

## 2021-12-02 ENCOUNTER — Other Ambulatory Visit: Payer: Self-pay

## 2021-12-02 ENCOUNTER — Encounter: Payer: Self-pay | Admitting: Oncology

## 2021-12-02 ENCOUNTER — Inpatient Hospital Stay: Payer: Medicare Other | Attending: Oncology | Admitting: Oncology

## 2021-12-02 ENCOUNTER — Other Ambulatory Visit: Payer: Self-pay | Admitting: Hematology and Oncology

## 2021-12-02 ENCOUNTER — Inpatient Hospital Stay: Payer: Medicare Other

## 2021-12-02 ENCOUNTER — Other Ambulatory Visit (HOSPITAL_COMMUNITY): Payer: Self-pay

## 2021-12-02 VITALS — BP 168/76 | HR 60 | Temp 97.4°F | Resp 16 | Ht 65.9 in | Wt 125.5 lb

## 2021-12-02 DIAGNOSIS — I10 Essential (primary) hypertension: Secondary | ICD-10-CM | POA: Diagnosis not present

## 2021-12-02 DIAGNOSIS — Z951 Presence of aortocoronary bypass graft: Secondary | ICD-10-CM | POA: Diagnosis not present

## 2021-12-02 DIAGNOSIS — Z8673 Personal history of transient ischemic attack (TIA), and cerebral infarction without residual deficits: Secondary | ICD-10-CM | POA: Diagnosis not present

## 2021-12-02 DIAGNOSIS — Z9181 History of falling: Secondary | ICD-10-CM | POA: Diagnosis not present

## 2021-12-02 DIAGNOSIS — I4892 Unspecified atrial flutter: Secondary | ICD-10-CM | POA: Diagnosis not present

## 2021-12-02 DIAGNOSIS — C3431 Malignant neoplasm of lower lobe, right bronchus or lung: Secondary | ICD-10-CM

## 2021-12-02 DIAGNOSIS — Z5112 Encounter for antineoplastic immunotherapy: Secondary | ICD-10-CM | POA: Diagnosis not present

## 2021-12-02 DIAGNOSIS — Z7901 Long term (current) use of anticoagulants: Secondary | ICD-10-CM | POA: Diagnosis not present

## 2021-12-02 DIAGNOSIS — C3490 Malignant neoplasm of unspecified part of unspecified bronchus or lung: Secondary | ICD-10-CM | POA: Diagnosis not present

## 2021-12-02 DIAGNOSIS — Z79899 Other long term (current) drug therapy: Secondary | ICD-10-CM | POA: Diagnosis not present

## 2021-12-02 DIAGNOSIS — K5900 Constipation, unspecified: Secondary | ICD-10-CM | POA: Diagnosis not present

## 2021-12-02 DIAGNOSIS — E785 Hyperlipidemia, unspecified: Secondary | ICD-10-CM | POA: Diagnosis not present

## 2021-12-02 DIAGNOSIS — J9811 Atelectasis: Secondary | ICD-10-CM | POA: Diagnosis not present

## 2021-12-02 DIAGNOSIS — J9601 Acute respiratory failure with hypoxia: Secondary | ICD-10-CM | POA: Diagnosis not present

## 2021-12-02 DIAGNOSIS — F17210 Nicotine dependence, cigarettes, uncomplicated: Secondary | ICD-10-CM | POA: Diagnosis not present

## 2021-12-02 DIAGNOSIS — C7931 Secondary malignant neoplasm of brain: Secondary | ICD-10-CM

## 2021-12-02 DIAGNOSIS — I252 Old myocardial infarction: Secondary | ICD-10-CM | POA: Diagnosis not present

## 2021-12-02 DIAGNOSIS — Z7984 Long term (current) use of oral hypoglycemic drugs: Secondary | ICD-10-CM | POA: Diagnosis not present

## 2021-12-02 DIAGNOSIS — I480 Paroxysmal atrial fibrillation: Secondary | ICD-10-CM | POA: Diagnosis not present

## 2021-12-02 DIAGNOSIS — I2510 Atherosclerotic heart disease of native coronary artery without angina pectoris: Secondary | ICD-10-CM | POA: Diagnosis not present

## 2021-12-02 DIAGNOSIS — M5020 Other cervical disc displacement, unspecified cervical region: Secondary | ICD-10-CM | POA: Diagnosis not present

## 2021-12-02 DIAGNOSIS — E1165 Type 2 diabetes mellitus with hyperglycemia: Secondary | ICD-10-CM | POA: Diagnosis not present

## 2021-12-02 DIAGNOSIS — M80052D Age-related osteoporosis with current pathological fracture, left femur, subsequent encounter for fracture with routine healing: Secondary | ICD-10-CM | POA: Diagnosis not present

## 2021-12-02 DIAGNOSIS — D630 Anemia in neoplastic disease: Secondary | ICD-10-CM | POA: Diagnosis not present

## 2021-12-02 DIAGNOSIS — Z9049 Acquired absence of other specified parts of digestive tract: Secondary | ICD-10-CM | POA: Diagnosis not present

## 2021-12-02 DIAGNOSIS — Z79891 Long term (current) use of opiate analgesic: Secondary | ICD-10-CM | POA: Diagnosis not present

## 2021-12-02 LAB — HEPATIC FUNCTION PANEL
ALT: 14 U/L (ref 7–35)
AST: 21 (ref 13–35)
Alkaline Phosphatase: 128 — AB (ref 25–125)
Bilirubin, Total: 0.4

## 2021-12-02 LAB — CBC
MCV: 87 (ref 81–99)
RBC: 4.2 (ref 3.87–5.11)

## 2021-12-02 LAB — BASIC METABOLIC PANEL
BUN: 12 (ref 4–21)
CO2: 28 — AB (ref 13–22)
Chloride: 106 (ref 99–108)
Creatinine: 0.7 (ref 0.5–1.1)
Glucose: 151
Potassium: 3.9 mEq/L (ref 3.5–5.1)
Sodium: 143 (ref 137–147)

## 2021-12-02 LAB — CBC AND DIFFERENTIAL
HCT: 37 (ref 36–46)
Hemoglobin: 11.4 — AB (ref 12.0–16.0)
Neutrophils Absolute: 3.54
Platelets: 202 10*3/uL (ref 150–400)
WBC: 5.9

## 2021-12-02 LAB — COMPREHENSIVE METABOLIC PANEL
Albumin: 4.1 (ref 3.5–5.0)
Calcium: 9 (ref 8.7–10.7)

## 2021-12-02 LAB — TSH: TSH: 1.801 u[IU]/mL (ref 0.350–4.500)

## 2021-12-03 LAB — T4: T4, Total: 10.3 ug/dL (ref 4.5–12.0)

## 2021-12-05 ENCOUNTER — Encounter: Payer: Self-pay | Admitting: Oncology

## 2021-12-05 DIAGNOSIS — Z79891 Long term (current) use of opiate analgesic: Secondary | ICD-10-CM | POA: Diagnosis not present

## 2021-12-05 DIAGNOSIS — M80052D Age-related osteoporosis with current pathological fracture, left femur, subsequent encounter for fracture with routine healing: Secondary | ICD-10-CM | POA: Diagnosis not present

## 2021-12-05 DIAGNOSIS — D630 Anemia in neoplastic disease: Secondary | ICD-10-CM | POA: Diagnosis not present

## 2021-12-05 DIAGNOSIS — K5900 Constipation, unspecified: Secondary | ICD-10-CM | POA: Diagnosis not present

## 2021-12-05 DIAGNOSIS — I480 Paroxysmal atrial fibrillation: Secondary | ICD-10-CM | POA: Diagnosis not present

## 2021-12-05 DIAGNOSIS — I2510 Atherosclerotic heart disease of native coronary artery without angina pectoris: Secondary | ICD-10-CM | POA: Diagnosis not present

## 2021-12-05 DIAGNOSIS — Z9049 Acquired absence of other specified parts of digestive tract: Secondary | ICD-10-CM | POA: Diagnosis not present

## 2021-12-05 DIAGNOSIS — I10 Essential (primary) hypertension: Secondary | ICD-10-CM | POA: Diagnosis not present

## 2021-12-05 DIAGNOSIS — I4892 Unspecified atrial flutter: Secondary | ICD-10-CM | POA: Diagnosis not present

## 2021-12-05 DIAGNOSIS — C3490 Malignant neoplasm of unspecified part of unspecified bronchus or lung: Secondary | ICD-10-CM | POA: Diagnosis not present

## 2021-12-05 DIAGNOSIS — Z951 Presence of aortocoronary bypass graft: Secondary | ICD-10-CM | POA: Diagnosis not present

## 2021-12-05 DIAGNOSIS — J9811 Atelectasis: Secondary | ICD-10-CM | POA: Diagnosis not present

## 2021-12-05 DIAGNOSIS — Z8673 Personal history of transient ischemic attack (TIA), and cerebral infarction without residual deficits: Secondary | ICD-10-CM | POA: Diagnosis not present

## 2021-12-05 DIAGNOSIS — F17210 Nicotine dependence, cigarettes, uncomplicated: Secondary | ICD-10-CM | POA: Diagnosis not present

## 2021-12-05 DIAGNOSIS — C7931 Secondary malignant neoplasm of brain: Secondary | ICD-10-CM | POA: Diagnosis not present

## 2021-12-05 DIAGNOSIS — I252 Old myocardial infarction: Secondary | ICD-10-CM | POA: Diagnosis not present

## 2021-12-05 DIAGNOSIS — J9601 Acute respiratory failure with hypoxia: Secondary | ICD-10-CM | POA: Diagnosis not present

## 2021-12-05 DIAGNOSIS — M5020 Other cervical disc displacement, unspecified cervical region: Secondary | ICD-10-CM | POA: Diagnosis not present

## 2021-12-05 DIAGNOSIS — E785 Hyperlipidemia, unspecified: Secondary | ICD-10-CM | POA: Diagnosis not present

## 2021-12-05 DIAGNOSIS — Z7901 Long term (current) use of anticoagulants: Secondary | ICD-10-CM | POA: Diagnosis not present

## 2021-12-05 DIAGNOSIS — E1165 Type 2 diabetes mellitus with hyperglycemia: Secondary | ICD-10-CM | POA: Diagnosis not present

## 2021-12-05 DIAGNOSIS — Z7984 Long term (current) use of oral hypoglycemic drugs: Secondary | ICD-10-CM | POA: Diagnosis not present

## 2021-12-05 DIAGNOSIS — Z9181 History of falling: Secondary | ICD-10-CM | POA: Diagnosis not present

## 2021-12-06 ENCOUNTER — Inpatient Hospital Stay: Payer: Medicare Other

## 2021-12-06 VITALS — BP 186/79 | HR 49 | Temp 97.9°F | Resp 16 | Wt 129.1 lb

## 2021-12-06 DIAGNOSIS — C3431 Malignant neoplasm of lower lobe, right bronchus or lung: Secondary | ICD-10-CM

## 2021-12-06 DIAGNOSIS — C7931 Secondary malignant neoplasm of brain: Secondary | ICD-10-CM | POA: Diagnosis not present

## 2021-12-06 DIAGNOSIS — Z5112 Encounter for antineoplastic immunotherapy: Secondary | ICD-10-CM | POA: Diagnosis not present

## 2021-12-06 DIAGNOSIS — Z79899 Other long term (current) drug therapy: Secondary | ICD-10-CM | POA: Diagnosis not present

## 2021-12-06 MED ORDER — SODIUM CHLORIDE 0.9% FLUSH
10.0000 mL | INTRAVENOUS | Status: DC | PRN
  Administered 2021-12-06: 10 mL

## 2021-12-06 MED ORDER — SODIUM CHLORIDE 0.9 % IV SOLN: Freq: Once | INTRAVENOUS | Status: AC

## 2021-12-06 MED ORDER — SODIUM CHLORIDE 0.9 % IV SOLN
200.0000 mg | Freq: Once | INTRAVENOUS | Status: AC
  Administered 2021-12-06: 200 mg via INTRAVENOUS
  Filled 2021-12-06: qty 8

## 2021-12-06 MED ORDER — HEPARIN SOD (PORK) LOCK FLUSH 100 UNIT/ML IV SOLN
500.0000 [IU] | Freq: Once | INTRAVENOUS | Status: AC | PRN
  Administered 2021-12-06: 500 [IU]

## 2021-12-06 NOTE — Patient Instructions (Signed)
[facility]  Discharge Instructions: Thank you for choosing [facility] to provide your oncology and hematology care.  If you have a lab appointment with the [facility], please go directly to the [facility] and check in at the registration area.   Wear comfortable clothing and clothing appropriate for easy access to any Portacath or PICC line.   We strive to give you quality time with your provider. You may need to reschedule your appointment if you arrive late (15 or more minutes).  Arriving late affects you and other patients whose appointments are after yours.  Also, if you miss three or more appointments without notifying the office, you may be dismissed from the clinic at the provider's discretion.      For prescription refill requests, have your pharmacy contact our office and allow 72 hours for refills to be completed.    Today you received the following chemotherapy and/or immunotherapy agents:Keytruda Pembrolizumab injection What is this medication? PEMBROLIZUMAB (pem broe liz ue mab) is a monoclonal antibody. It is used to treat certain types of cancer. This medicine may be used for other purposes; ask your health care provider or pharmacist if you have questions. COMMON BRAND NAME(S): Keytruda What should I tell my care team before I take this medication? They need to know if you have any of these conditions: autoimmune diseases like Crohn's disease, ulcerative colitis, or lupus have had or planning to have an allogeneic stem cell transplant (uses someone else's stem cells) history of organ transplant history of chest radiation nervous system problems like myasthenia gravis or Guillain-Barre syndrome an unusual or allergic reaction to pembrolizumab, other medicines, foods, dyes, or preservatives pregnant or trying to get pregnant breast-feeding How should I use this medication? This medicine is for infusion into a vein. It is given  by a health care professional in a hospital or clinic setting. A special MedGuide will be given to you before each treatment. Be sure to read this information carefully each time. Talk to your pediatrician regarding the use of this medicine in children. While this drug may be prescribed for children as young as 6 months for selected conditions, precautions do apply. Overdosage: If you think you have taken too much of this medicine contact a poison control center or emergency room at once. NOTE: This medicine is only for you. Do not share this medicine with others. What if I miss a dose? It is important not to miss your dose. Call your doctor or health care professional if you are unable to keep an appointment. What may interact with this medication? Interactions have not been studied. This list may not describe all possible interactions. Give your health care provider a list of all the medicines, herbs, non-prescription drugs, or dietary supplements you use. Also tell them if you smoke, drink alcohol, or use illegal drugs. Some items may interact with your medicine. What should I watch for while using this medication? Your condition will be monitored carefully while you are receiving this medicine. You may need blood work done while you are taking this medicine. Do not become pregnant while taking this medicine or for 4 months after stopping it. Women should inform their doctor if they wish to become pregnant or think they might be pregnant. There is a potential for serious side effects to an unborn child. Talk to your health care professional or pharmacist for more information. Do not breast-feed an infant while taking this medicine or for  4 months after the last dose. What side effects may I notice from receiving this medication? Side effects that you should report to your doctor or health care professional as soon as possible: allergic reactions like skin rash, itching or hives, swelling of the  face, lips, or tongue bloody or black, tarry breathing problems changes in vision chest pain chills confusion constipation cough diarrhea dizziness or feeling faint or lightheaded fast or irregular heartbeat fever flushing joint pain low blood counts - this medicine may decrease the number of white blood cells, red blood cells and platelets. You may be at increased risk for infections and bleeding. muscle pain muscle weakness pain, tingling, numbness in the hands or feet persistent headache redness, blistering, peeling or loosening of the skin, including inside the mouth signs and symptoms of high blood sugar such as dizziness; dry mouth; dry skin; fruity breath; nausea; stomach pain; increased hunger or thirst; increased urination signs and symptoms of kidney injury like trouble passing urine or change in the amount of urine signs and symptoms of liver injury like dark urine, light-colored stools, loss of appetite, nausea, right upper belly pain, yellowing of the eyes or skin sweating swollen lymph nodes weight loss Side effects that usually do not require medical attention (report to your doctor or health care professional if they continue or are bothersome): decreased appetite hair loss tiredness This list may not describe all possible side effects. Call your doctor for medical advice about side effects. You may report side effects to FDA at 1-800-FDA-1088. Where should I keep my medication? This drug is given in a hospital or clinic and will not be stored at home. NOTE: This sheet is a summary. It may not cover all possible information. If you have questions about this medicine, talk to your doctor, pharmacist, or health care provider.  2023 Elsevier/Gold Standard (2021-05-13 00:00:00)       To help prevent nausea and vomiting after your treatment, we encourage you to take your nausea medication as directed.  BELOW ARE SYMPTOMS THAT SHOULD BE REPORTED IMMEDIATELY: *FEVER  GREATER THAN 100.4 F (38 C) OR HIGHER *CHILLS OR SWEATING *NAUSEA AND VOMITING THAT IS NOT CONTROLLED WITH YOUR NAUSEA MEDICATION *UNUSUAL SHORTNESS OF BREATH *UNUSUAL BRUISING OR BLEEDING *URINARY PROBLEMS (pain or burning when urinating, or frequent urination) *BOWEL PROBLEMS (unusual diarrhea, constipation, pain near the anus) TENDERNESS IN MOUTH AND THROAT WITH OR WITHOUT PRESENCE OF ULCERS (sore throat, sores in mouth, or a toothache) UNUSUAL RASH, SWELLING OR PAIN  UNUSUAL VAGINAL DISCHARGE OR ITCHING   Items with * indicate a potential emergency and should be followed up as soon as possible or go to the Emergency Department if any problems should occur.  Please show the CHEMOTHERAPY ALERT CARD or IMMUNOTHERAPY ALERT CARD at check-in to the Emergency Department and triage nurse.  Should you have questions after your visit or need to cancel or reschedule your appointment, please contact [facility]  Dept: [phone]  and follow the prompts.  Office hours are 8:00 a.m. to 4:30 p.m. Monday - Friday. Please note that voicemails left after 4:00 p.m. may not be returned until the following business day.  We are closed weekends and major holidays. You have access to a nurse at all times for urgent questions. Please call the main number to the clinic Dept: [phone] and follow the prompts.  For any non-urgent questions, you may also contact your provider using MyChart. We now offer e-Visits for anyone 66 and older  to request care online for non-urgent symptoms. For details visit [email or web address].   Also download the MyChart app! Go to the app store, search "MyChart", open the app, select [facility], and log in with your MyChart username and password.  Due to Covid, a mask is required upon entering the hospital/clinic. If you do not have a mask, one will be given to you upon arrival. For doctor visits, patients may have 1 support person aged 63 or older with  them. For treatment visits, patients cannot have anyone with them due to current Covid guidelines and our immunocompromised population.

## 2021-12-07 DIAGNOSIS — E785 Hyperlipidemia, unspecified: Secondary | ICD-10-CM | POA: Diagnosis not present

## 2021-12-07 DIAGNOSIS — Z9181 History of falling: Secondary | ICD-10-CM | POA: Diagnosis not present

## 2021-12-07 DIAGNOSIS — K5900 Constipation, unspecified: Secondary | ICD-10-CM | POA: Diagnosis not present

## 2021-12-07 DIAGNOSIS — I10 Essential (primary) hypertension: Secondary | ICD-10-CM | POA: Diagnosis not present

## 2021-12-07 DIAGNOSIS — Z7901 Long term (current) use of anticoagulants: Secondary | ICD-10-CM | POA: Diagnosis not present

## 2021-12-07 DIAGNOSIS — I480 Paroxysmal atrial fibrillation: Secondary | ICD-10-CM | POA: Diagnosis not present

## 2021-12-07 DIAGNOSIS — I252 Old myocardial infarction: Secondary | ICD-10-CM | POA: Diagnosis not present

## 2021-12-07 DIAGNOSIS — Z8673 Personal history of transient ischemic attack (TIA), and cerebral infarction without residual deficits: Secondary | ICD-10-CM | POA: Diagnosis not present

## 2021-12-07 DIAGNOSIS — J9811 Atelectasis: Secondary | ICD-10-CM | POA: Diagnosis not present

## 2021-12-07 DIAGNOSIS — M5020 Other cervical disc displacement, unspecified cervical region: Secondary | ICD-10-CM | POA: Diagnosis not present

## 2021-12-07 DIAGNOSIS — C7931 Secondary malignant neoplasm of brain: Secondary | ICD-10-CM | POA: Diagnosis not present

## 2021-12-07 DIAGNOSIS — Z79891 Long term (current) use of opiate analgesic: Secondary | ICD-10-CM | POA: Diagnosis not present

## 2021-12-07 DIAGNOSIS — Z7984 Long term (current) use of oral hypoglycemic drugs: Secondary | ICD-10-CM | POA: Diagnosis not present

## 2021-12-07 DIAGNOSIS — I4892 Unspecified atrial flutter: Secondary | ICD-10-CM | POA: Diagnosis not present

## 2021-12-07 DIAGNOSIS — C3490 Malignant neoplasm of unspecified part of unspecified bronchus or lung: Secondary | ICD-10-CM | POA: Diagnosis not present

## 2021-12-07 DIAGNOSIS — E1165 Type 2 diabetes mellitus with hyperglycemia: Secondary | ICD-10-CM | POA: Diagnosis not present

## 2021-12-07 DIAGNOSIS — Z9049 Acquired absence of other specified parts of digestive tract: Secondary | ICD-10-CM | POA: Diagnosis not present

## 2021-12-07 DIAGNOSIS — F17210 Nicotine dependence, cigarettes, uncomplicated: Secondary | ICD-10-CM | POA: Diagnosis not present

## 2021-12-07 DIAGNOSIS — I2510 Atherosclerotic heart disease of native coronary artery without angina pectoris: Secondary | ICD-10-CM | POA: Diagnosis not present

## 2021-12-07 DIAGNOSIS — Z951 Presence of aortocoronary bypass graft: Secondary | ICD-10-CM | POA: Diagnosis not present

## 2021-12-07 DIAGNOSIS — J9601 Acute respiratory failure with hypoxia: Secondary | ICD-10-CM | POA: Diagnosis not present

## 2021-12-07 DIAGNOSIS — M80052D Age-related osteoporosis with current pathological fracture, left femur, subsequent encounter for fracture with routine healing: Secondary | ICD-10-CM | POA: Diagnosis not present

## 2021-12-07 DIAGNOSIS — D630 Anemia in neoplastic disease: Secondary | ICD-10-CM | POA: Diagnosis not present

## 2021-12-08 DIAGNOSIS — C3490 Malignant neoplasm of unspecified part of unspecified bronchus or lung: Secondary | ICD-10-CM | POA: Diagnosis not present

## 2021-12-08 DIAGNOSIS — K5900 Constipation, unspecified: Secondary | ICD-10-CM | POA: Diagnosis not present

## 2021-12-08 DIAGNOSIS — E785 Hyperlipidemia, unspecified: Secondary | ICD-10-CM | POA: Diagnosis not present

## 2021-12-08 DIAGNOSIS — I2510 Atherosclerotic heart disease of native coronary artery without angina pectoris: Secondary | ICD-10-CM | POA: Diagnosis not present

## 2021-12-08 DIAGNOSIS — I252 Old myocardial infarction: Secondary | ICD-10-CM | POA: Diagnosis not present

## 2021-12-08 DIAGNOSIS — I10 Essential (primary) hypertension: Secondary | ICD-10-CM | POA: Diagnosis not present

## 2021-12-08 DIAGNOSIS — Z9181 History of falling: Secondary | ICD-10-CM | POA: Diagnosis not present

## 2021-12-08 DIAGNOSIS — F17210 Nicotine dependence, cigarettes, uncomplicated: Secondary | ICD-10-CM | POA: Diagnosis not present

## 2021-12-08 DIAGNOSIS — J9811 Atelectasis: Secondary | ICD-10-CM | POA: Diagnosis not present

## 2021-12-08 DIAGNOSIS — M80052D Age-related osteoporosis with current pathological fracture, left femur, subsequent encounter for fracture with routine healing: Secondary | ICD-10-CM | POA: Diagnosis not present

## 2021-12-08 DIAGNOSIS — E1165 Type 2 diabetes mellitus with hyperglycemia: Secondary | ICD-10-CM | POA: Diagnosis not present

## 2021-12-08 DIAGNOSIS — Z9049 Acquired absence of other specified parts of digestive tract: Secondary | ICD-10-CM | POA: Diagnosis not present

## 2021-12-08 DIAGNOSIS — Z79891 Long term (current) use of opiate analgesic: Secondary | ICD-10-CM | POA: Diagnosis not present

## 2021-12-08 DIAGNOSIS — M5020 Other cervical disc displacement, unspecified cervical region: Secondary | ICD-10-CM | POA: Diagnosis not present

## 2021-12-08 DIAGNOSIS — Z7984 Long term (current) use of oral hypoglycemic drugs: Secondary | ICD-10-CM | POA: Diagnosis not present

## 2021-12-08 DIAGNOSIS — Z7901 Long term (current) use of anticoagulants: Secondary | ICD-10-CM | POA: Diagnosis not present

## 2021-12-08 DIAGNOSIS — I480 Paroxysmal atrial fibrillation: Secondary | ICD-10-CM | POA: Diagnosis not present

## 2021-12-08 DIAGNOSIS — Z8673 Personal history of transient ischemic attack (TIA), and cerebral infarction without residual deficits: Secondary | ICD-10-CM | POA: Diagnosis not present

## 2021-12-08 DIAGNOSIS — I4892 Unspecified atrial flutter: Secondary | ICD-10-CM | POA: Diagnosis not present

## 2021-12-08 DIAGNOSIS — C7931 Secondary malignant neoplasm of brain: Secondary | ICD-10-CM | POA: Diagnosis not present

## 2021-12-08 DIAGNOSIS — Z951 Presence of aortocoronary bypass graft: Secondary | ICD-10-CM | POA: Diagnosis not present

## 2021-12-08 DIAGNOSIS — D630 Anemia in neoplastic disease: Secondary | ICD-10-CM | POA: Diagnosis not present

## 2021-12-08 DIAGNOSIS — J9601 Acute respiratory failure with hypoxia: Secondary | ICD-10-CM | POA: Diagnosis not present

## 2021-12-09 DIAGNOSIS — M80052D Age-related osteoporosis with current pathological fracture, left femur, subsequent encounter for fracture with routine healing: Secondary | ICD-10-CM | POA: Diagnosis not present

## 2021-12-09 DIAGNOSIS — F17210 Nicotine dependence, cigarettes, uncomplicated: Secondary | ICD-10-CM | POA: Diagnosis not present

## 2021-12-09 DIAGNOSIS — Z9049 Acquired absence of other specified parts of digestive tract: Secondary | ICD-10-CM | POA: Diagnosis not present

## 2021-12-09 DIAGNOSIS — I10 Essential (primary) hypertension: Secondary | ICD-10-CM | POA: Diagnosis not present

## 2021-12-09 DIAGNOSIS — Z7901 Long term (current) use of anticoagulants: Secondary | ICD-10-CM | POA: Diagnosis not present

## 2021-12-09 DIAGNOSIS — Z7984 Long term (current) use of oral hypoglycemic drugs: Secondary | ICD-10-CM | POA: Diagnosis not present

## 2021-12-09 DIAGNOSIS — J9811 Atelectasis: Secondary | ICD-10-CM | POA: Diagnosis not present

## 2021-12-09 DIAGNOSIS — C3490 Malignant neoplasm of unspecified part of unspecified bronchus or lung: Secondary | ICD-10-CM | POA: Diagnosis not present

## 2021-12-09 DIAGNOSIS — J9601 Acute respiratory failure with hypoxia: Secondary | ICD-10-CM | POA: Diagnosis not present

## 2021-12-09 DIAGNOSIS — M5020 Other cervical disc displacement, unspecified cervical region: Secondary | ICD-10-CM | POA: Diagnosis not present

## 2021-12-09 DIAGNOSIS — Z8673 Personal history of transient ischemic attack (TIA), and cerebral infarction without residual deficits: Secondary | ICD-10-CM | POA: Diagnosis not present

## 2021-12-09 DIAGNOSIS — Z79891 Long term (current) use of opiate analgesic: Secondary | ICD-10-CM | POA: Diagnosis not present

## 2021-12-09 DIAGNOSIS — K5900 Constipation, unspecified: Secondary | ICD-10-CM | POA: Diagnosis not present

## 2021-12-09 DIAGNOSIS — I2510 Atherosclerotic heart disease of native coronary artery without angina pectoris: Secondary | ICD-10-CM | POA: Diagnosis not present

## 2021-12-09 DIAGNOSIS — I4892 Unspecified atrial flutter: Secondary | ICD-10-CM | POA: Diagnosis not present

## 2021-12-09 DIAGNOSIS — E785 Hyperlipidemia, unspecified: Secondary | ICD-10-CM | POA: Diagnosis not present

## 2021-12-09 DIAGNOSIS — C7931 Secondary malignant neoplasm of brain: Secondary | ICD-10-CM | POA: Diagnosis not present

## 2021-12-09 DIAGNOSIS — I252 Old myocardial infarction: Secondary | ICD-10-CM | POA: Diagnosis not present

## 2021-12-09 DIAGNOSIS — Z9181 History of falling: Secondary | ICD-10-CM | POA: Diagnosis not present

## 2021-12-09 DIAGNOSIS — I480 Paroxysmal atrial fibrillation: Secondary | ICD-10-CM | POA: Diagnosis not present

## 2021-12-09 DIAGNOSIS — D630 Anemia in neoplastic disease: Secondary | ICD-10-CM | POA: Diagnosis not present

## 2021-12-09 DIAGNOSIS — E1165 Type 2 diabetes mellitus with hyperglycemia: Secondary | ICD-10-CM | POA: Diagnosis not present

## 2021-12-09 DIAGNOSIS — Z951 Presence of aortocoronary bypass graft: Secondary | ICD-10-CM | POA: Diagnosis not present

## 2021-12-10 ENCOUNTER — Encounter: Payer: Self-pay | Admitting: Oncology

## 2021-12-13 DIAGNOSIS — I480 Paroxysmal atrial fibrillation: Secondary | ICD-10-CM | POA: Diagnosis not present

## 2021-12-13 DIAGNOSIS — M80052D Age-related osteoporosis with current pathological fracture, left femur, subsequent encounter for fracture with routine healing: Secondary | ICD-10-CM | POA: Diagnosis not present

## 2021-12-13 DIAGNOSIS — I2510 Atherosclerotic heart disease of native coronary artery without angina pectoris: Secondary | ICD-10-CM | POA: Diagnosis not present

## 2021-12-13 DIAGNOSIS — Z9181 History of falling: Secondary | ICD-10-CM | POA: Diagnosis not present

## 2021-12-13 DIAGNOSIS — J9601 Acute respiratory failure with hypoxia: Secondary | ICD-10-CM | POA: Diagnosis not present

## 2021-12-13 DIAGNOSIS — E1165 Type 2 diabetes mellitus with hyperglycemia: Secondary | ICD-10-CM | POA: Diagnosis not present

## 2021-12-13 DIAGNOSIS — E785 Hyperlipidemia, unspecified: Secondary | ICD-10-CM | POA: Diagnosis not present

## 2021-12-13 DIAGNOSIS — Z79891 Long term (current) use of opiate analgesic: Secondary | ICD-10-CM | POA: Diagnosis not present

## 2021-12-13 DIAGNOSIS — M5020 Other cervical disc displacement, unspecified cervical region: Secondary | ICD-10-CM | POA: Diagnosis not present

## 2021-12-13 DIAGNOSIS — C3490 Malignant neoplasm of unspecified part of unspecified bronchus or lung: Secondary | ICD-10-CM | POA: Diagnosis not present

## 2021-12-13 DIAGNOSIS — Z9049 Acquired absence of other specified parts of digestive tract: Secondary | ICD-10-CM | POA: Diagnosis not present

## 2021-12-13 DIAGNOSIS — D630 Anemia in neoplastic disease: Secondary | ICD-10-CM | POA: Diagnosis not present

## 2021-12-13 DIAGNOSIS — K5900 Constipation, unspecified: Secondary | ICD-10-CM | POA: Diagnosis not present

## 2021-12-13 DIAGNOSIS — Z951 Presence of aortocoronary bypass graft: Secondary | ICD-10-CM | POA: Diagnosis not present

## 2021-12-13 DIAGNOSIS — Z7984 Long term (current) use of oral hypoglycemic drugs: Secondary | ICD-10-CM | POA: Diagnosis not present

## 2021-12-13 DIAGNOSIS — F17210 Nicotine dependence, cigarettes, uncomplicated: Secondary | ICD-10-CM | POA: Diagnosis not present

## 2021-12-13 DIAGNOSIS — C7931 Secondary malignant neoplasm of brain: Secondary | ICD-10-CM | POA: Diagnosis not present

## 2021-12-13 DIAGNOSIS — Z7901 Long term (current) use of anticoagulants: Secondary | ICD-10-CM | POA: Diagnosis not present

## 2021-12-13 DIAGNOSIS — Z8673 Personal history of transient ischemic attack (TIA), and cerebral infarction without residual deficits: Secondary | ICD-10-CM | POA: Diagnosis not present

## 2021-12-13 DIAGNOSIS — I252 Old myocardial infarction: Secondary | ICD-10-CM | POA: Diagnosis not present

## 2021-12-13 DIAGNOSIS — I10 Essential (primary) hypertension: Secondary | ICD-10-CM | POA: Diagnosis not present

## 2021-12-13 DIAGNOSIS — I4892 Unspecified atrial flutter: Secondary | ICD-10-CM | POA: Diagnosis not present

## 2021-12-13 DIAGNOSIS — J9811 Atelectasis: Secondary | ICD-10-CM | POA: Diagnosis not present

## 2021-12-14 DIAGNOSIS — D630 Anemia in neoplastic disease: Secondary | ICD-10-CM | POA: Diagnosis not present

## 2021-12-14 DIAGNOSIS — Z7984 Long term (current) use of oral hypoglycemic drugs: Secondary | ICD-10-CM | POA: Diagnosis not present

## 2021-12-14 DIAGNOSIS — Z7901 Long term (current) use of anticoagulants: Secondary | ICD-10-CM | POA: Diagnosis not present

## 2021-12-14 DIAGNOSIS — F17210 Nicotine dependence, cigarettes, uncomplicated: Secondary | ICD-10-CM | POA: Diagnosis not present

## 2021-12-14 DIAGNOSIS — C7931 Secondary malignant neoplasm of brain: Secondary | ICD-10-CM | POA: Diagnosis not present

## 2021-12-14 DIAGNOSIS — Z9181 History of falling: Secondary | ICD-10-CM | POA: Diagnosis not present

## 2021-12-14 DIAGNOSIS — K5900 Constipation, unspecified: Secondary | ICD-10-CM | POA: Diagnosis not present

## 2021-12-14 DIAGNOSIS — Z79891 Long term (current) use of opiate analgesic: Secondary | ICD-10-CM | POA: Diagnosis not present

## 2021-12-14 DIAGNOSIS — Z8673 Personal history of transient ischemic attack (TIA), and cerebral infarction without residual deficits: Secondary | ICD-10-CM | POA: Diagnosis not present

## 2021-12-14 DIAGNOSIS — I2510 Atherosclerotic heart disease of native coronary artery without angina pectoris: Secondary | ICD-10-CM | POA: Diagnosis not present

## 2021-12-14 DIAGNOSIS — M5020 Other cervical disc displacement, unspecified cervical region: Secondary | ICD-10-CM | POA: Diagnosis not present

## 2021-12-14 DIAGNOSIS — I252 Old myocardial infarction: Secondary | ICD-10-CM | POA: Diagnosis not present

## 2021-12-14 DIAGNOSIS — J9811 Atelectasis: Secondary | ICD-10-CM | POA: Diagnosis not present

## 2021-12-14 DIAGNOSIS — E1165 Type 2 diabetes mellitus with hyperglycemia: Secondary | ICD-10-CM | POA: Diagnosis not present

## 2021-12-14 DIAGNOSIS — E785 Hyperlipidemia, unspecified: Secondary | ICD-10-CM | POA: Diagnosis not present

## 2021-12-14 DIAGNOSIS — Z951 Presence of aortocoronary bypass graft: Secondary | ICD-10-CM | POA: Diagnosis not present

## 2021-12-14 DIAGNOSIS — M80052D Age-related osteoporosis with current pathological fracture, left femur, subsequent encounter for fracture with routine healing: Secondary | ICD-10-CM | POA: Diagnosis not present

## 2021-12-14 DIAGNOSIS — I10 Essential (primary) hypertension: Secondary | ICD-10-CM | POA: Diagnosis not present

## 2021-12-14 DIAGNOSIS — I4892 Unspecified atrial flutter: Secondary | ICD-10-CM | POA: Diagnosis not present

## 2021-12-14 DIAGNOSIS — C3490 Malignant neoplasm of unspecified part of unspecified bronchus or lung: Secondary | ICD-10-CM | POA: Diagnosis not present

## 2021-12-14 DIAGNOSIS — Z9049 Acquired absence of other specified parts of digestive tract: Secondary | ICD-10-CM | POA: Diagnosis not present

## 2021-12-14 DIAGNOSIS — J9601 Acute respiratory failure with hypoxia: Secondary | ICD-10-CM | POA: Diagnosis not present

## 2021-12-14 DIAGNOSIS — I480 Paroxysmal atrial fibrillation: Secondary | ICD-10-CM | POA: Diagnosis not present

## 2021-12-15 DIAGNOSIS — E1140 Type 2 diabetes mellitus with diabetic neuropathy, unspecified: Secondary | ICD-10-CM | POA: Diagnosis not present

## 2021-12-15 DIAGNOSIS — C3490 Malignant neoplasm of unspecified part of unspecified bronchus or lung: Secondary | ICD-10-CM | POA: Diagnosis not present

## 2021-12-15 DIAGNOSIS — I10 Essential (primary) hypertension: Secondary | ICD-10-CM | POA: Diagnosis not present

## 2021-12-15 DIAGNOSIS — C7931 Secondary malignant neoplasm of brain: Secondary | ICD-10-CM | POA: Diagnosis not present

## 2021-12-16 DIAGNOSIS — I4892 Unspecified atrial flutter: Secondary | ICD-10-CM | POA: Diagnosis not present

## 2021-12-16 DIAGNOSIS — Z951 Presence of aortocoronary bypass graft: Secondary | ICD-10-CM | POA: Diagnosis not present

## 2021-12-16 DIAGNOSIS — Z7984 Long term (current) use of oral hypoglycemic drugs: Secondary | ICD-10-CM | POA: Diagnosis not present

## 2021-12-16 DIAGNOSIS — E1165 Type 2 diabetes mellitus with hyperglycemia: Secondary | ICD-10-CM | POA: Diagnosis not present

## 2021-12-16 DIAGNOSIS — M5020 Other cervical disc displacement, unspecified cervical region: Secondary | ICD-10-CM | POA: Diagnosis not present

## 2021-12-16 DIAGNOSIS — J9811 Atelectasis: Secondary | ICD-10-CM | POA: Diagnosis not present

## 2021-12-16 DIAGNOSIS — C7931 Secondary malignant neoplasm of brain: Secondary | ICD-10-CM | POA: Diagnosis not present

## 2021-12-16 DIAGNOSIS — Z9181 History of falling: Secondary | ICD-10-CM | POA: Diagnosis not present

## 2021-12-16 DIAGNOSIS — Z79891 Long term (current) use of opiate analgesic: Secondary | ICD-10-CM | POA: Diagnosis not present

## 2021-12-16 DIAGNOSIS — I10 Essential (primary) hypertension: Secondary | ICD-10-CM | POA: Diagnosis not present

## 2021-12-16 DIAGNOSIS — K5900 Constipation, unspecified: Secondary | ICD-10-CM | POA: Diagnosis not present

## 2021-12-16 DIAGNOSIS — Z8673 Personal history of transient ischemic attack (TIA), and cerebral infarction without residual deficits: Secondary | ICD-10-CM | POA: Diagnosis not present

## 2021-12-16 DIAGNOSIS — I252 Old myocardial infarction: Secondary | ICD-10-CM | POA: Diagnosis not present

## 2021-12-16 DIAGNOSIS — M80052D Age-related osteoporosis with current pathological fracture, left femur, subsequent encounter for fracture with routine healing: Secondary | ICD-10-CM | POA: Diagnosis not present

## 2021-12-16 DIAGNOSIS — F17210 Nicotine dependence, cigarettes, uncomplicated: Secondary | ICD-10-CM | POA: Diagnosis not present

## 2021-12-16 DIAGNOSIS — J9601 Acute respiratory failure with hypoxia: Secondary | ICD-10-CM | POA: Diagnosis not present

## 2021-12-16 DIAGNOSIS — C3490 Malignant neoplasm of unspecified part of unspecified bronchus or lung: Secondary | ICD-10-CM | POA: Diagnosis not present

## 2021-12-16 DIAGNOSIS — I480 Paroxysmal atrial fibrillation: Secondary | ICD-10-CM | POA: Diagnosis not present

## 2021-12-16 DIAGNOSIS — I2510 Atherosclerotic heart disease of native coronary artery without angina pectoris: Secondary | ICD-10-CM | POA: Diagnosis not present

## 2021-12-16 DIAGNOSIS — Z9049 Acquired absence of other specified parts of digestive tract: Secondary | ICD-10-CM | POA: Diagnosis not present

## 2021-12-16 DIAGNOSIS — E785 Hyperlipidemia, unspecified: Secondary | ICD-10-CM | POA: Diagnosis not present

## 2021-12-16 DIAGNOSIS — Z7901 Long term (current) use of anticoagulants: Secondary | ICD-10-CM | POA: Diagnosis not present

## 2021-12-16 DIAGNOSIS — D630 Anemia in neoplastic disease: Secondary | ICD-10-CM | POA: Diagnosis not present

## 2021-12-21 ENCOUNTER — Other Ambulatory Visit: Payer: Self-pay

## 2021-12-21 DIAGNOSIS — G8929 Other chronic pain: Secondary | ICD-10-CM

## 2021-12-21 DIAGNOSIS — M89411 Other hypertrophic osteoarthropathy, right shoulder: Secondary | ICD-10-CM

## 2021-12-21 MED ORDER — OXYCODONE HCL 5 MG PO TABS: 10.0000 mg | ORAL_TABLET | ORAL | 0 refills | Status: DC | PRN

## 2021-12-22 NOTE — Progress Notes (Signed)
Patient Care Team: Townsend Roger, MD as PCP - General (Internal Medicine) Sharmon Revere as Physician Assistant (Cardiology)  Clinic Day: 12/23/21  Referring physician: Townsend Roger, MD  ASSESSMENT & PLAN:   Assessment & Plan: Lung cancer, lower lobe [facility]) She received 1 dose of chemotherapy in June 2022, with combination cisplatin/Taxol/pembrolizumab, and tolerated this very poorly and ended up in the hospital twice. She is now receiving palliative pembrolizumab since October, and tolerating well. CT chest from January 2023 is stable to mildly improved.  The patient took a break and went to [place] for a time and so he missed some doses.  She is had repeat scans and all appears stable so we resumed treatment with pembrolizumab every 3 weeks.  She is now has a port.    Brain metastases [facility]) Brain metastases diagnosed in April 2022.  This presented with right sided head pain and pain of the right eye.  She received stereotactic radiation to the two brain lesions. MRI brain from September 26th revealed four metastatic deposits in the brain. Several show mild associated hemorrhage. Mild edema. No mass-effect or midline shift. We will continue to monitor these. MRI brain from January revealed a mixed response with one new enhancing lesion in the right cerebellar hemisphere measuring 3 mm. One lesion in the left frontal lobe has resolved, one lesion in the right occipital lobe is stable, and two lesions are mildly decreased in size.  Repeat scan shows resolution of the right cerebellar lesion and decreased size of the right occipital and bilateral frontal lesions   Shoulder pain, right Worsening right shoulder pain. 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 Right shoulder x-ray revealed diffusely decreased mineralization of the bones. No acute fracture or dislocation. Hypertrophic degenerative changes present at the acromioclavicular joint. Mild  degenerative changes at the glenohumeral joint. Sternotomy wires are noted over the midline. The soft tissues are within normal limits.  Mild anemia Her hemoglobin was 12.3 in March and then down to 11.0. She now remains stable at 11.5.  Her CT scan of the chest and MRI of the brain from May are stable to improved.  She resumed pembrolizumab.She still has pain of the left shoulder, left hip and lower back. She gets relief with Tylenol. She did have a hip fracture earlier this year but is slowly recovering. She complains of fatigue. I have encouraged her to try the Remeron for her appetite.  We will see her back in 3 weeks with CBC and comprehensive metabolic profile prior to her next dose.The patient understands the plans discussed today and is in agreement with them.  She knows to contact our office if she develops concerns prior to her next appointment.    Derwood Kaplan, MD  [facility] [address] [place] [address] Dept: [phone] Dept Fax: [phone]      CHIEF COMPLAINT:  CC: A 76 year old female with history of lung cancer here for 3 week evaluation  Current Treatment:  Pembrolizumab  INTERVAL HISTORY:  [name] is here today for repeat clinical assessment. The CT of the chest in May shows stability with an unchanged spiculated nodule of the right lower lobe measuring 1.9 cm with multiple additional irregular nodules of the right lower lobe, also unchanged.  The MRI of the brain shows resolution of the right cerebellar lesion and decrease in size of the right occipital lesion and  the 2 bilateral frontal lobe lesions these were already tiny on the last scan and so continue to improve.  Her labs show her hemoglobin is stable at 11.4. CMP reveals a BS of 276. She denies fevers or chills. She denies pain. Her appetite is good. She will take her diabetic injections when she gets home.  I have reviewed the past  medical history, past surgical history, social history and family history with the patient and they are unchanged from previous note.  ALLERGIES:  is allergic to ergocalciferol.  MEDICATIONS:  Current Outpatient Medications  Medication Sig Dispense Refill   ALPRAZolam (XANAX) 0.5 MG tablet Take 1 tablet (0.5 mg total) by mouth 3 (three) times daily as needed for anxiety. 90 tablet 0   atorvastatin (LIPITOR) 80 MG tablet Take 80 mg by mouth daily.     dexamethasone (DECADRON) 4 MG tablet Take 4 mg by mouth daily as needed.     gabapentin (NEURONTIN) 100 MG capsule Take by mouth.     glipiZIDE (GLUCOTROL XL) 5 MG 24 hr tablet Take 5 mg by mouth daily.     HUMULIN R 100 UNIT/ML injection      JANUVIA 100 MG tablet Take 100 mg by mouth daily.     LORazepam (ATIVAN) 0.5 MG tablet Take 1 tablet (0.5 mg total) by mouth at bedtime. 30 tablet 0   losartan (COZAAR) 25 MG tablet Take 25 mg by mouth daily.     metoprolol tartrate (LOPRESSOR) 25 MG tablet Take 25 mg by mouth 2 (two) times daily.     mupirocin cream (BACTROBAN) 2 % Apply topically.     ondansetron (ZOFRAN) 4 MG tablet Take 1 tablet (4 mg total) by mouth every 4 (four) hours as needed for nausea. 90 tablet 3   Oxycodone HCl 10 MG TABS Take 1 tablet (10 mg total) by mouth every 6 (six) hours as needed (for severe pain). 60 tablet 0   prochlorperazine (COMPAZINE) 10 MG tablet Take 1 tablet (10 mg total) by mouth every 6 (six) hours as needed for nausea or vomiting. 90 tablet 3   rivaroxaban (XARELTO) 20 MG TABS tablet Take 20 mg by mouth daily with supper.     No current facility-administered medications for this visit.    HISTORY OF PRESENT ILLNESS:   Oncology History  Lung cancer, lower lobe ([place])  07/07/2020 Initial Diagnosis   Lung cancer, lower lobe ([place])   10/15/2020 Cancer Staging   Staging form: Lung, AJCC 8th Edition - Clinical stage from 10/15/2020: Stage IV (cT1b, cN0, cM1) - Signed by Derwood Kaplan, MD on  03/31/2021 Histopathologic type: Squamous cell carcinoma, NOS Stage prefix: Initial diagnosis Histologic grade (G): G3 Histologic grading system: 4 grade system Laterality: Right Tumor size (mm): 18 Sites of metastasis: Brain Lymph-vascular invasion (LVI): Presence of LVI unknown/indeterminate Diagnostic confirmation: Positive histology Specimen type: Core Needle Biopsy Staged by: Managing physician Type of lung cancer: Locally advanced or metastatic non-small cell lung cancer Stage used in treatment planning: Yes National guidelines used in treatment planning: Yes Type of national guideline used in treatment planning: NCCN   04/08/2021 -  Chemotherapy   Patient is on Treatment Plan : LUNG NSCLC flat dose Pembrolizumab Q21D         REVIEW OF SYSTEMS:   Constitutional: Denies fevers, chills or abnormal weight loss Her appetite is poor. Eyes: Denies blurriness of vision Ears, nose, mouth, throat, and face: Denies mucositis or sore throat Respiratory: Denies cough, dyspnea or wheezes Cardiovascular:  Denies palpitation, chest discomfort or lower extremity swelling Gastrointestinal:  Denies nausea, heartburn or change in bowel habits Skin: Denies abnormal skin rashes Lymphatics: Denies new lymphadenopathy or easy bruising Neurological:Denies numbness, tingling or new weaknesses Behavioral/Psych: Mood is stable, no new changes  All other systems were reviewed with the patient and are negative.   VITALS:  Blood pressure (!) 159/67, pulse 60, temperature 97.7 F (36.5 C), temperature source Oral, resp. rate 17, height 5' 5.9" (1.674 m), weight 127 lb 6.4 oz (57.8 kg), SpO2 97 %.  Wt Readings from Last 3 Encounters:  01/17/22 127 lb 4 oz (57.7 kg)  01/13/22 126 lb 12.8 oz (57.5 kg)  12/26/21 127 lb 1.3 oz (57.6 kg)    Body mass index is 20.63 kg/m.  Performance status (ECOG): 1 - Symptomatic but completely ambulatory  PHYSICAL EXAM:   GENERAL:alert, no distress and  comfortable SKIN: skin color, texture, turgor are normal, no rashes or significant lesions EYES: normal, Conjunctiva are pink and non-injected, sclera clear OROPHARYNX:no exudate, no erythema and lips, buccal mucosa, and tongue normal  NECK: supple, thyroid normal size, non-tender, without nodularity LYMPH:  no palpable lymphadenopathy in the cervical, axillary or inguinal LUNGS: clear to auscultation and percussion with normal breathing effort HEART: irregular rhythm with bradycardia and no murmurs and no lower extremity edema ABDOMEN:abdomen soft, non-tender and normal bowel sounds Musculoskeletal:no cyanosis of digits and no clubbing  NEURO: alert & oriented x 3 with fluent speech, no focal motor/sensory deficits  LABORATORY DATA:  I have reviewed the data as listed    Component Value Date/Time   NA 139 01/13/2022 0000   K 4.7 01/13/2022 0000   CL 105 01/13/2022 0000   CO2 23 (A) 01/13/2022 0000   GLUCOSE 83 10/20/2016 1618   BUN 15 01/13/2022 0000   CREATININE 0.9 01/13/2022 0000   CREATININE 1.07 (H) 10/20/2016 1618   CALCIUM 9.7 01/13/2022 0000   PROT 6.7 08/26/2010 0051   ALBUMIN 4.3 01/13/2022 0000   AST 26 01/13/2022 0000   ALT 18 01/13/2022 0000   ALKPHOS 116 01/13/2022 0000   BILITOT 0.5 08/26/2010 0051   GFRNONAA 51 (L) 10/20/2016 1618   GFRAA 59 (L) 10/20/2016 1618    No results found for: "SPEP", "UPEP"  Lab Results  Component Value Date   WBC 8.9 01/13/2022   NEUTROABS 4.72 01/13/2022   HGB 12.5 01/13/2022   HCT 39 01/13/2022   MCV 86 01/13/2022   PLT 212 01/13/2022      Chemistry      Component Value Date/Time   NA 139 01/13/2022 0000   K 4.7 01/13/2022 0000   CL 105 01/13/2022 0000   CO2 23 (A) 01/13/2022 0000   BUN 15 01/13/2022 0000   CREATININE 0.9 01/13/2022 0000   CREATININE 1.07 (H) 10/20/2016 1618   GLU 128 01/13/2022 0000      Component Value Date/Time   CALCIUM 9.7 01/13/2022 0000   ALKPHOS 116 01/13/2022 0000   AST 26 01/13/2022  0000   ALT 18 01/13/2022 0000   BILITOT 0.5 08/26/2010 0051       RADIOGRAPHIC STUDIES: I have personally reviewed the radiological images as listed and agreed with the findings in the report.    EXAM: 11/07/21 MRI HEAD WITHOUT AND WITH CONTRAST  TECHNIQUE:  Multiplanar, multiecho pulse sequences of the brain and surrounding  structures were obtained without and with intravenous contrast.  COMPARISON: MRI of the brain June 29, 2021.  FINDINGS:  The study is degraded  by motion.  Brain: No new enhancing lesion identified.  Inferior right cerebellar hemisphere lesion described on prior MRI  is no longer seen.  Interval decreased size of enhancing lesion in the right occipital  lobe, now measuring 4 mm compared to 6 mm on prior (series 11, image  106).  Punctate residual focus of contrast enhancement with hemosiderin  deposit in the left frontal operculum, now measuring 1 mm compared  to 2 mm on prior (series 11, image 122).  Cortical focus of residual contrast enhancement with hemosiderin  deposit in the posterior right frontal lobe, now measuring 4 mm  compared to 5 mm on prior (series 11, image 158).  No acute infarction, hemorrhage, hydrocephalus or extra-axial  collection. 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  Scattered and confluent foci of T2 hyperintensity are seen within  the white matter of the cerebral hemispheres are nonspecific, most  likely related to chronic small vessel ischemia.  Vascular: Normal flow voids.  Skull and upper cervical spine: Normal marrow signal. Sinuses/Orbits: Negative.  Other: None.  IMPRESSION:  Findings consistent with treatment response with resolution of right  cerebella lesion described on prior MRI and decrease in size of  right occipital and bilateral frontal lesions, as described above.  No new enhancing lesion identified.   Electronically Signed   By: Pedro Earls M.D.  On: 11/08/2021 15:25     EXAM: 11/07/21 CT CHEST WITHOUT CONTRAST  TECHNIQUE:  Multidetector CT imaging of the chest was performed following the  standard protocol without IV contrast.  RADIATION DOSE REDUCTION: This exam was performed according to the  departmental dose-optimization program which includes automated  exposure control, adjustment of the mA and/or kV according to  patient size and/or use of iterative reconstruction technique.  COMPARISON: 06/29/2021  FINDINGS:  Cardiovascular: Aortic atherosclerosis. Normal heart size. 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 No pericardial effusion.  Mediastinum/Nodes: No enlarged mediastinal, hilar, or axillary lymph  nodes. Thyroid gland, trachea, and esophagus demonstrate no  significant findings.  Lungs/Pleura: Unchanged spiculated nodule of the superior segment  right lower lobe measuring 1.9 x 1.6 cm (series 301, image 62).  Multiple additional generally clustered, irregular nodules of the  right lower lobe are likewise unchanged, most notably a 0.7 cm  nodule of the peripheral subpleural right lower lobe (series 301,  image 71). Background of extensive, fine centrilobular nodularity  throughout, generally concentrated in the lung apices. No pleural  effusion or pneumothorax.  Upper Abdomen: No acute abnormality. Stable, benign fatty  attenuation left adrenal adenoma (series 2, image 117). Status post  cholecystectomy.  Musculoskeletal: No chest wall abnormality. No suspicious osseous  lesions identified.  IMPRESSION:  1. Unchanged spiculated nodule of the superior segment right lower  lobe measuring 1.9 x 1.6 cm.  2. Multiple additional generally clustered, irregular nodules of the  right lower lobe are likewise unchanged, measuring 0.7 cm and  smaller.  3. Background of extensive, fine centrilobular nodularity   throughout, concentrated in the lung apices, consistent with  smoking-related respiratory bronchiolitis.  4. Coronary artery disease.  5. Stable, benign fatty attenuation left adrenal adenoma. This  demonstrates internal macroscopic fatty attenuation and has been  stable greater than 5 years.  Aortic Atherosclerosis (ICD10-I70.0).   Electronically Signed  By: Delanna Ahmadi M.D.  On: 11/08/2021 15:09

## 2021-12-23 ENCOUNTER — Telehealth: Payer: Self-pay | Admitting: Oncology

## 2021-12-23 ENCOUNTER — Encounter: Payer: Self-pay | Admitting: Oncology

## 2021-12-23 ENCOUNTER — Inpatient Hospital Stay (INDEPENDENT_AMBULATORY_CARE_PROVIDER_SITE_OTHER): Payer: Medicare Other | Admitting: Oncology

## 2021-12-23 ENCOUNTER — Inpatient Hospital Stay: Payer: Medicare Other

## 2021-12-23 VITALS — BP 159/67 | HR 60 | Temp 97.7°F | Resp 17 | Ht 65.9 in | Wt 127.4 lb

## 2021-12-23 DIAGNOSIS — C7931 Secondary malignant neoplasm of brain: Secondary | ICD-10-CM

## 2021-12-23 DIAGNOSIS — Z79899 Other long term (current) drug therapy: Secondary | ICD-10-CM | POA: Diagnosis not present

## 2021-12-23 DIAGNOSIS — C3431 Malignant neoplasm of lower lobe, right bronchus or lung: Secondary | ICD-10-CM | POA: Diagnosis not present

## 2021-12-23 DIAGNOSIS — D649 Anemia, unspecified: Secondary | ICD-10-CM | POA: Diagnosis not present

## 2021-12-23 DIAGNOSIS — Z5112 Encounter for antineoplastic immunotherapy: Secondary | ICD-10-CM | POA: Diagnosis not present

## 2021-12-23 LAB — BASIC METABOLIC PANEL
BUN: 13 (ref 4–21)
CO2: 28 — AB (ref 13–22)
Chloride: 104 (ref 99–108)
Creatinine: 0.8 (ref 0.5–1.1)
Glucose: 276
Potassium: 4.2 mEq/L (ref 3.5–5.1)
Sodium: 139 (ref 137–147)

## 2021-12-23 LAB — CBC AND DIFFERENTIAL
HCT: 36 (ref 36–46)
Hemoglobin: 11.5 — AB (ref 12.0–16.0)
Neutrophils Absolute: 3.54
Platelets: 195 10*3/uL (ref 150–400)
WBC: 6

## 2021-12-23 LAB — CBC: RBC: 4.18 (ref 3.87–5.11)

## 2021-12-23 LAB — COMPREHENSIVE METABOLIC PANEL
Albumin: 4.1 (ref 3.5–5.0)
Calcium: 9.2 (ref 8.7–10.7)

## 2021-12-23 LAB — HEPATIC FUNCTION PANEL
ALT: 15 U/L (ref 7–35)
AST: 20 (ref 13–35)
Alkaline Phosphatase: 120 (ref 25–125)
Bilirubin, Total: 0.5

## 2021-12-23 LAB — TSH: TSH: 2.502 u[IU]/mL (ref 0.350–4.500)

## 2021-12-23 MED FILL — Pembrolizumab IV Soln 100 MG/4ML (25 MG/ML): INTRAVENOUS | Qty: 8 | Status: AC

## 2021-12-23 NOTE — Telephone Encounter (Signed)
Per 12/23/21 los next appt scheduled and confirmed with patient 

## 2021-12-24 LAB — T4: T4, Total: 8.2 ug/dL (ref 4.5–12.0)

## 2021-12-26 ENCOUNTER — Inpatient Hospital Stay: Payer: Medicare Other | Attending: Oncology

## 2021-12-26 VITALS — BP 140/79 | HR 58 | Temp 98.0°F | Resp 18 | Ht 65.9 in | Wt 127.1 lb

## 2021-12-26 DIAGNOSIS — Z5112 Encounter for antineoplastic immunotherapy: Secondary | ICD-10-CM | POA: Diagnosis not present

## 2021-12-26 DIAGNOSIS — Z79899 Other long term (current) drug therapy: Secondary | ICD-10-CM | POA: Diagnosis not present

## 2021-12-26 DIAGNOSIS — C3431 Malignant neoplasm of lower lobe, right bronchus or lung: Secondary | ICD-10-CM | POA: Insufficient documentation

## 2021-12-26 DIAGNOSIS — C7931 Secondary malignant neoplasm of brain: Secondary | ICD-10-CM | POA: Insufficient documentation

## 2021-12-26 MED ORDER — HEPARIN SOD (PORK) LOCK FLUSH 100 UNIT/ML IV SOLN
500.0000 [IU] | Freq: Once | INTRAVENOUS | Status: AC | PRN
  Administered 2021-12-26: 500 [IU]

## 2021-12-26 MED ORDER — SODIUM CHLORIDE 0.9 % IV SOLN: Freq: Once | INTRAVENOUS | Status: AC

## 2021-12-26 MED ORDER — SODIUM CHLORIDE 0.9 % IV SOLN
200.0000 mg | Freq: Once | INTRAVENOUS | Status: AC
  Administered 2021-12-26: 200 mg via INTRAVENOUS
  Filled 2021-12-26: qty 8

## 2021-12-26 MED ORDER — SODIUM CHLORIDE 0.9% FLUSH
10.0000 mL | INTRAVENOUS | Status: DC | PRN
  Administered 2021-12-26: 10 mL

## 2021-12-26 NOTE — Patient Instructions (Signed)
[name]  Discharge Instructions: Thank you for choosing [facility] to provide your oncology and hematology care.  If you have a lab appointment with the [facility], please go directly to the [facility] and check in at the registration area.   Wear comfortable clothing and clothing appropriate for easy access to any Portacath or PICC line.   We strive to give you quality time with your provider. You may need to reschedule your appointment if you arrive late (15 or more minutes).  Arriving late affects you and other patients whose appointments are after yours.  Also, if you miss three or more appointments without notifying the office, you may be dismissed from the clinic at the provider's discretion.      For prescription refill requests, have your pharmacy contact our office and allow 72 hours for refills to be completed.    Today you received the following chemotherapy and/or immunotherapy agents Beryle Flock   To help prevent nausea and vomiting after your treatment, we encourage you to take your nausea medication as directed.  BELOW ARE SYMPTOMS THAT SHOULD BE REPORTED IMMEDIATELY: *FEVER GREATER THAN 100.4 F (38 C) OR HIGHER *CHILLS OR SWEATING *NAUSEA AND VOMITING THAT IS NOT CONTROLLED WITH YOUR NAUSEA MEDICATION *UNUSUAL SHORTNESS OF BREATH *UNUSUAL BRUISING OR BLEEDING *URINARY PROBLEMS (pain or burning when urinating, or frequent urination) *BOWEL PROBLEMS (unusual diarrhea, constipation, pain near the anus) TENDERNESS IN MOUTH AND THROAT WITH OR WITHOUT PRESENCE OF ULCERS (sore throat, sores in mouth, or a toothache) UNUSUAL RASH, SWELLING OR PAIN  UNUSUAL VAGINAL DISCHARGE OR ITCHING   Items with * indicate a potential emergency and should be followed up as soon as possible or go to the Emergency Department if any problems should occur.  Please show the CHEMOTHERAPY ALERT CARD or IMMUNOTHERAPY ALERT CARD at check-in to the  Emergency Department and triage nurse.  Should you have questions after your visit or need to cancel or reschedule your appointment, please contact [facility]  Dept: [phone]  and follow the prompts.  Office hours are 8:00 a.m. to 4:30 p.m. Monday - Friday. Please note that voicemails left after 4:00 p.m. may not be returned until the following business day.  We are closed weekends and major holidays. You have access to a nurse at all times for urgent questions. Please call the main number to the clinic Dept: [phone] and follow the prompts.  For any non-urgent questions, you may also contact your provider using MyChart. We now offer e-Visits for anyone 35 and older to request care online for non-urgent symptoms. For details visit [email or web address].   Also download the MyChart app! Go to the app store, search "MyChart", open the app, select [facility], and log in with your MyChart username and password.  Masks are optional in the cancer centers. If you would like for your care team to wear a mask while they are taking care of you, please let them know. For doctor visits, patients may have with them one support person who is at least 77 years old. At this time, visitors are not allowed in the infusion area.  Pembrolizumab injection What is this medication? PEMBROLIZUMAB (pem broe liz ue mab) is a monoclonal antibody. It is used to treat certain types of cancer. This medicine may be used for other purposes; ask your health care provider or pharmacist if you have questions. COMMON BRAND NAME(S): Keytruda What should I tell my care team  before I take this medication? They need to know if you have any of these conditions: autoimmune diseases like Crohn's disease, ulcerative colitis, or lupus have had or planning to have an allogeneic stem cell transplant (uses someone else's stem cells) history of organ transplant history of chest radiation nervous system  problems like myasthenia gravis or Guillain-Barre syndrome an unusual or allergic reaction to pembrolizumab, other medicines, foods, dyes, or preservatives pregnant or trying to get pregnant breast-feeding How should I use this medication? This medicine is for infusion into a vein. It is given by a health care professional in a hospital or clinic setting. A special MedGuide will be given to you before each treatment. Be sure to read this information carefully each time. Talk to your pediatrician regarding the use of this medicine in children. While this drug may be prescribed for children as young as 6 months for selected conditions, precautions do apply. Overdosage: If you think you have taken too much of this medicine contact a poison control center or emergency room at once. NOTE: This medicine is only for you. Do not share this medicine with others. What if I miss a dose? It is important not to miss your dose. Call your doctor or health care professional if you are unable to keep an appointment. What may interact with this medication? Interactions have not been studied. This list may not describe all possible interactions. Give your health care provider a list of all the medicines, herbs, non-prescription drugs, or dietary supplements you use. Also tell them if you smoke, drink alcohol, or use illegal drugs. Some items may interact with your medicine. What should I watch for while using this medication? Your condition will be monitored carefully while you are receiving this medicine. You may need blood work done while you are taking this medicine. Do not become pregnant while taking this medicine or for 4 months after stopping it. Women should inform their doctor if they wish to become pregnant or think they might be pregnant. There is a potential for serious side effects to an unborn child. Talk to your health care professional or pharmacist for more information. Do not breast-feed an infant  while taking this medicine or for 4 months after the last dose. What side effects may I notice from receiving this medication? Side effects that you should report to your doctor or health care professional as soon as possible: allergic reactions like skin rash, itching or hives, swelling of the face, lips, or tongue bloody or black, tarry breathing problems changes in vision chest pain chills confusion constipation cough diarrhea dizziness or feeling faint or lightheaded fast or irregular heartbeat fever flushing joint pain low blood counts - this medicine may decrease the number of white blood cells, red blood cells and platelets. You may be at increased risk for infections and bleeding. muscle pain muscle weakness pain, tingling, numbness in the hands or feet persistent headache redness, blistering, peeling or loosening of the skin, including inside the mouth signs and symptoms of high blood sugar such as dizziness; dry mouth; dry skin; fruity breath; nausea; stomach pain; increased hunger or thirst; increased urination signs and symptoms of kidney injury like trouble passing urine or change in the amount of urine signs and symptoms of liver injury like dark urine, light-colored stools, loss of appetite, nausea, right upper belly pain, yellowing of the eyes or skin sweating swollen lymph nodes weight loss Side effects that usually do not require medical attention (report to your doctor or  health care professional if they continue or are bothersome): decreased appetite hair loss tiredness This list may not describe all possible side effects. Call your doctor for medical advice about side effects. You may report side effects to FDA at 1-800-FDA-1088. Where should I keep my medication? This drug is given in a hospital or clinic and will not be stored at home. NOTE: This sheet is a summary. It may not cover all possible information. If you have questions about this medicine, talk  to your doctor, pharmacist, or health care provider.  2023 Elsevier/Gold Standard (2021-05-13 00:00:00)

## 2021-12-30 ENCOUNTER — Encounter: Payer: Self-pay | Admitting: Oncology

## 2022-01-12 DIAGNOSIS — S72143A Displaced intertrochanteric fracture of unspecified femur, initial encounter for closed fracture: Secondary | ICD-10-CM | POA: Diagnosis not present

## 2022-01-12 DIAGNOSIS — S72142D Displaced intertrochanteric fracture of left femur, subsequent encounter for closed fracture with routine healing: Secondary | ICD-10-CM | POA: Diagnosis not present

## 2022-01-12 NOTE — Progress Notes (Signed)
[facility]  [address] [place],  [place]  [address] [phone]  Clinic Day:  01/13/2022  Referring physician: Townsend Roger, MD  ASSESSMENT & PLAN:   Assessment & Plan: Lung cancer, lower lobe ([place]) Stage IV lung cancer diagnosed in April 2022.  She received 1 dose of chemotherapy in June 2022, with combination cisplatin/paclitaxel/pembrolizumab, and tolerated this very poorly.  She ended up in the hospital twice.  Since October 2022, she has been receiving palliative pembrolizumab and has tolerated this well.  She took a break and went to [place] for a time and so she missed some doses.  Also, while she was away, she fell and had a left hip fracture requiring surgery.  She was seen in May with a CT chest and MRI head prior to resuming pembrolizumab after her break.  Her disease appeared stable, so she has continued with pembrolizumab every 3 weeks.  She will proceed with and 11th cycle next week.  We will plan to see her back in 3 weeks for repeat clinical assessment prior to 12th cycle.  Malignant neoplasm metastatic to brain [facility]) Brain metastases diagnosed in April 2022.  This presented with right sided head pain and pain of the right eye.  She received stereotactic radiation to the two brain lesions. MRI brain in September revealed four metastatic deposits in the brain. Several show mild associated hemorrhage. Mild edema. No mass-effect or midline shift.  She was placed on immunotherapy with pembrolizumab every 3 weeks in October.  MRI brain in January revealed a mixed response with one new enhancing lesion in the right cerebellar hemisphere measuring 3 mm. One lesion in the left frontal lobe has resolved, one lesion in the right occipital lobe is stable, and two lesions were mildly decreased in size.    MRI brain in May revealed resolution of the right cerebellar lesion and decreased size of the right occipital and bilateral frontal lesions.   The  patient and her daughter understand the plans discussed today and are in agreement with them.  They know to contact our office if she develops concerns prior to her next appointment.   I provided 15 minutes of face-to-face time during this encounter and > 50% was spent counseling as documented under my assessment and plan.    Raven Pickles, PA-C  [facility] AT [facility] [address] [place] [address] Dept: [phone] Dept Fax: [phone]   Orders Placed This Encounter  Procedures   CBC and differential    This external order was created through the Results Console.   CBC    This external order was created through the Results Console.   CBC    This order was created through External Result Entry   Basic metabolic panel    This external order was created through the Results Console.   Comprehensive metabolic panel    This external order was created through the Results Console.   Hepatic function panel    This external order was created through the Results Console.      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  Current Treatment: Palliative pembrolizumab every 3 weeks  HISTORY OF PRESENT ILLNESS:   Oncology History  Lung cancer, lower lobe ([place])  07/07/2020 Initial Diagnosis   Lung cancer, lower lobe ([place])   10/15/2020 Cancer Staging   Staging form: Lung, AJCC 8th Edition - Clinical stage from 10/15/2020:  Stage IV (cT1b, cN0, cM1) - Signed by Derwood Kaplan, MD on 03/31/2021 Histopathologic type: Squamous cell carcinoma, NOS Stage prefix: Initial diagnosis Histologic grade (G): G3 Histologic grading system: 4 grade system Laterality: Right Tumor size (mm): 18 Sites of metastasis: Brain Lymph-vascular invasion (LVI): Presence of LVI unknown/indeterminate Diagnostic confirmation: Positive histology Specimen type: Core Needle Biopsy Staged by: Managing  physician Type of lung cancer: Locally advanced or metastatic non-small cell lung cancer Stage used in treatment planning: Yes National guidelines used in treatment planning: Yes Type of national guideline used in treatment planning: NCCN   04/08/2021 -  Chemotherapy   Patient is on Treatment Plan : LUNG NSCLC flat dose Pembrolizumab Q21D         INTERVAL HISTORY:  [name] is here today for repeat clinical assessment prior to an 11th cycle of pembrolizumab.  She states she continues to tolerate this without significant difficulty.  Her only complaint is fatigue.  She reports intermittent shortness of breath and dry cough, but denies increased cough or shortness of breath.  She denies diarrhea.  She denies skin rashes.   She denies fevers or chills. She denies pain. Her appetite is good. Her weight has been stable.  She fell when she was at home in April and sustained a left hip fracture requiring open reduction and internal fixation.  She has recovered fairly well, but does ambulate with a cane  REVIEW OF SYSTEMS:  Review of Systems  Constitutional:  Positive for fatigue. Negative for appetite change, chills, fever and unexpected weight change.  HENT:   Negative for lump/mass, mouth sores and sore throat.   Respiratory:  Positive for cough and shortness of breath.   Cardiovascular:  Negative for chest pain and leg swelling.  Gastrointestinal:  Negative for abdominal pain, constipation, diarrhea, nausea and vomiting.  Endocrine: Negative for hot flashes.  Genitourinary:  Negative for difficulty urinating, dysuria, frequency and hematuria.   Musculoskeletal:  Positive for gait problem. Negative for arthralgias, back pain and myalgias.  Skin:  Negative for rash.  Neurological:  Positive for gait problem. Negative for dizziness and headaches.  Hematological:  Negative for adenopathy. Does not bruise/bleed easily.  Psychiatric/Behavioral:  Negative for depression and sleep disturbance. The  patient is not nervous/anxious.      VITALS:  Blood pressure 139/67, pulse (!) 59, temperature 97.7 F (36.5 C), temperature source Oral, resp. rate 18, height 5' 5.9" (1.674 m), weight 126 lb 12.8 oz (57.5 kg), SpO2 97 %.  Wt Readings from Last 3 Encounters:  01/13/22 126 lb 12.8 oz (57.5 kg)  12/26/21 127 lb 1.3 oz (57.6 kg)  12/23/21 127 lb 6.4 oz (57.8 kg)    Body mass index is 20.53 kg/m.  Performance status (ECOG): 1 - Symptomatic but completely ambulatory  PHYSICAL EXAM:  Physical Exam Vitals and nursing note reviewed.  Constitutional:      General: She is not in acute distress.    Appearance: Normal appearance.  HENT:     Head: Normocephalic and atraumatic.     Mouth/Throat:     Mouth: Mucous membranes are moist.     Pharynx: Oropharynx is clear. No oropharyngeal exudate or posterior oropharyngeal erythema.  Eyes:     General: No scleral icterus.    Extraocular Movements: Extraocular movements intact.     Conjunctiva/sclera: Conjunctivae normal.     Pupils: Pupils are equal, round, and reactive to light.  Cardiovascular:     Rate and Rhythm: Normal rate and regular rhythm.  Heart sounds: Normal heart sounds. No murmur heard.    No friction rub. No gallop.  Pulmonary:     Effort: Pulmonary effort is normal.     Breath sounds: Normal breath sounds. No wheezing, rhonchi or rales.  Abdominal:     General: There is no distension.     Palpations: Abdomen is soft. There is no hepatomegaly, splenomegaly or mass.     Tenderness: There is no abdominal tenderness.  Musculoskeletal:        General: Normal range of motion.     Cervical back: Normal range of motion and neck supple. No tenderness.     Right lower leg: No edema.     Left lower leg: No edema.  Lymphadenopathy:     Cervical: No cervical adenopathy.     Upper Body:     Right upper body: No supraclavicular or axillary adenopathy.     Left upper body: No supraclavicular or axillary adenopathy.     Lower  Body: No right inguinal adenopathy. No left inguinal adenopathy.  Skin:    General: Skin is warm and dry.     Coloration: Skin is not jaundiced.     Findings: No rash.  Neurological:     Mental Status: She is alert and oriented to person, place, and time.     Cranial Nerves: No cranial nerve deficit.  Psychiatric:        Mood and Affect: Mood normal.        Behavior: Behavior normal.        Thought Content: Thought content normal.    LABS:      Latest Ref Rng & Units 01/13/2022   12:00 AM 12/23/2021   12:00 AM 12/02/2021   12:00 AM  CBC  WBC  8.9     6.0     5.9      Hemoglobin 12.0 - 16.0 12.5     11.5     11.4      Hematocrit 36 - 46 39     36     37      Platelets 150 - 400 K/uL 212     195     202         This result is from an external source.      Latest Ref Rng & Units 01/13/2022   12:00 AM 12/23/2021   12:00 AM 12/02/2021   12:00 AM  CMP  BUN 4 - 21 15     13     12       Creatinine 0.5 - 1.1 0.9     0.8     0.7      Sodium 137 - 147 139     139     143      Potassium 3.5 - 5.1 mEq/L 4.7     4.2     3.9      Chloride 99 - 108 105     104     106      CO2 13 - 22 23     28     28       Calcium 8.7 - 10.7 9.7     9.2     9.0      Alkaline Phos 25 - 125 116     120     128      AST 13 - 35 26     20     21  ALT 7 - 35 U/L 18     15     14          This result is from an external source.     Lab Results  Component Value Date   CEA1 1.2 02/04/2021   /  CEA  Date Value Ref Range Status  02/04/2021 1.2 0.0 - 4.7 ng/mL Final    Comment:    (NOTE)                             Nonsmokers          <3.9                             Smokers             <5.6 Roche [employer] Electrochemiluminescence Immunoassay (ECLIA) Values obtained with different assay methods or kits cannot be used interchangeably.  Results cannot be interpreted as absolute evidence of the presence or absence of malignant disease. Performed At: [facility] [place], [place] [id number] Rush Farmer MD [id number]    No results found for: "PSA1" No results found for: "CAN199" No results found for: "CAN125"  No results found for: "TOTALPROTELP", "ALBUMINELP", "A1GS", "A2GS", "BETS", "BETA2SER", "GAMS", "MSPIKE", "SPEI" Lab Results  Component Value Date   TIBC 364 02/10/2021   FERRITIN 22 02/10/2021   IRONPCTSAT 17 02/10/2021   No results found for: "LDH"  STUDIES:  No results found.    HISTORY:   Past Medical History:  Diagnosis Date   Arthritis    Asthma    CAD (coronary artery disease) 09/09/2010   S/p multiple PCIs to RCA, LAD, LCx // s/p inf STEMI 4/18 >> POBA to mRCA followed by CABG (L-LAD, S-D1, S-OM, S-PDA) // Intraoperative TEE 4/18: EF 50-55, no RWMA   Constipation 04/18/2021   Diabetes mellitus    Diabetic nephropathy (HCC)    stage II   Diabetic neuropathy (HCC)    GERD (gastroesophageal reflux disease)    History of MI (myocardial infarction) 05/2002   Hyperlipidemia 09/09/2010       Hypertension    Lung cancer ([place])    with brain metastases   Persistent atrial fibrillation ([place]) 10/01/2016   Post op AFib after CABG 4/18 // Amiodarone and Coumadin started // Amiodarone stopped 11/2016   Shoulder pain, right 04/18/2021   Stroke ([place])    Weight loss 05/18/2021    Past Surgical History:  Procedure Laterality Date   CHOLECYSTECTOMY     CORONARY ARTERY BYPASS GRAFT N/A 10/06/2016   Procedure: CORONARY ARTERY BYPASS GRAFTING (CABG) x four, using internal mammary, and bilateral saphenous veins harvested endoscopically;  Surgeon: Melrose Nakayama, MD;  Location: [facility];  Service: Open Heart Surgery;  Laterality: N/A;   CORONARY BALLOON ANGIOPLASTY N/A 10/01/2016   Procedure: Coronary Balloon Angioplasty;  Surgeon: Belva Crome, MD;  Location: [facility] CV [facility];  Service: Cardiovascular;  Laterality: N/A;   LEFT HEART CATH AND CORONARY ANGIOGRAPHY N/A 10/01/2016   Procedure: Left Heart Cath and Coronary  Angiography;  Surgeon: Belva Crome, MD;  Location: [facility] CV LAB;  Service: Cardiovascular;  Laterality: N/A;   TEE WITHOUT CARDIOVERSION N/A 10/06/2016   Procedure: TRANSESOPHAGEAL ECHOCARDIOGRAM (TEE);  Surgeon: Melrose Nakayama, MD;  Location: [facility];  Service: Open Heart Surgery;  Laterality: N/A;    Family History  Problem Relation  Age of Onset   Tuberculosis Mother    Cancer Father 38       unknown type   Leukemia Sister    Tuberculosis Sister    Cancer Sister        liver   Cancer Brother 44   Cancer Brother    Heart attack Brother 34   Cancer Brother     Social History:  reports that she has been smoking cigarettes. She has a 60.00 pack-year smoking history. She has never used smokeless tobacco. She reports that she does not drink alcohol and does not use drugs.The patient is accompanied by her daughter today.  Allergies:  Allergies  Allergen Reactions   Ergocalciferol Nausea And Vomiting    Current Medications: Current Outpatient Medications  Medication Sig Dispense Refill   ALPRAZolam (XANAX) 0.5 MG tablet Take 1 tablet (0.5 mg total) by mouth 3 (three) times daily as needed for anxiety. 90 tablet 0   atorvastatin (LIPITOR) 80 MG tablet Take 80 mg by mouth daily.     dexamethasone (DECADRON) 4 MG tablet Take 4 mg by mouth daily as needed.     gabapentin (NEURONTIN) 100 MG capsule Take by mouth.     glipiZIDE (GLUCOTROL XL) 5 MG 24 hr tablet Take 5 mg by mouth daily.     HUMULIN R 100 UNIT/ML injection      JANUVIA 100 MG tablet Take 100 mg by mouth daily.     LORazepam (ATIVAN) 0.5 MG tablet Take 1 tablet (0.5 mg total) by mouth at bedtime. 30 tablet 0   losartan (COZAAR) 25 MG tablet Take 25 mg by mouth daily.     metoprolol tartrate (LOPRESSOR) 25 MG tablet Take 25 mg by mouth 2 (two) times daily.     mupirocin cream (BACTROBAN) 2 % Apply topically.     ondansetron (ZOFRAN) 4 MG tablet Take 1 tablet (4 mg total) by mouth every 4 (four) hours as needed  for nausea. 90 tablet 3   oxyCODONE (OXY IR/ROXICODONE) 5 MG immediate release tablet Take 2 tablets (10 mg total) by mouth every 4 (four) hours as needed for severe pain. 120 tablet 0   prochlorperazine (COMPAZINE) 10 MG tablet Take 1 tablet (10 mg total) by mouth every 6 (six) hours as needed for nausea or vomiting. 90 tablet 3   rivaroxaban (XARELTO) 20 MG TABS tablet Take 20 mg by mouth daily with supper.     No current facility-administered medications for this visit.

## 2022-01-12 NOTE — Assessment & Plan Note (Signed)
Brain metastases diagnosed in April 2022. This presented with right sided head pain and pain of the right eye. She received stereotactic radiation to the two brain lesions. MRI brain in September revealed four metastatic deposits in the brain. Several show mild associated hemorrhage. Mild edema. No mass-effect or midline shift.  She was placed on immunotherapy with pembrolizumab every 3 weeks in October.  MRI brain in January revealed a mixed response with one new enhancing lesion in the right cerebellar hemisphere measuring 3 mm. One lesion in the left frontal lobe has resolved, one lesion in the right occipital lobe is stable, and two lesions were mildly decreased in size.    MRI brain in May revealed resolution of the right cerebellar lesion and decreased size of the right occipital and bilateral frontal lesions.

## 2022-01-12 NOTE — Assessment & Plan Note (Addendum)
Stage IV lung cancer diagnosed in April 2022.  She received 1 dose of chemotherapy in June 2022, withcombination cisplatin/paclitaxel/pembrolizumab, and tolerated this very poorly.  She ended up in the hospitaltwice.  Since October 2022, she has been receiving palliative pembrolizumab and has tolerated this well.  She took a break and went to [place] for a time and so she missed some doses.  Also, while she was away, she fell and had a left hip fracture requiring surgery.  She was seen in May with a CT chest and MRI head prior to resuming pembrolizumab after her break.  Her disease appeared stable, so she has continued with pembrolizumab every 3 weeks.  She will proceed with and 11th cycle next week.  We will plan to see her back in 3 weeks for repeat clinical assessment prior to 12th cycle.

## 2022-01-13 ENCOUNTER — Other Ambulatory Visit: Payer: Self-pay | Admitting: Hematology and Oncology

## 2022-01-13 ENCOUNTER — Encounter: Payer: Self-pay | Admitting: Hematology and Oncology

## 2022-01-13 ENCOUNTER — Inpatient Hospital Stay (INDEPENDENT_AMBULATORY_CARE_PROVIDER_SITE_OTHER): Payer: Medicare Other | Admitting: Hematology and Oncology

## 2022-01-13 ENCOUNTER — Inpatient Hospital Stay: Payer: Medicare Other

## 2022-01-13 DIAGNOSIS — I10 Essential (primary) hypertension: Secondary | ICD-10-CM

## 2022-01-13 DIAGNOSIS — Z5112 Encounter for antineoplastic immunotherapy: Secondary | ICD-10-CM | POA: Diagnosis not present

## 2022-01-13 DIAGNOSIS — C3431 Malignant neoplasm of lower lobe, right bronchus or lung: Secondary | ICD-10-CM

## 2022-01-13 DIAGNOSIS — E119 Type 2 diabetes mellitus without complications: Secondary | ICD-10-CM | POA: Diagnosis not present

## 2022-01-13 DIAGNOSIS — Z806 Family history of leukemia: Secondary | ICD-10-CM

## 2022-01-13 DIAGNOSIS — M89411 Other hypertrophic osteoarthropathy, right shoulder: Secondary | ICD-10-CM

## 2022-01-13 DIAGNOSIS — Z808 Family history of malignant neoplasm of other organs or systems: Secondary | ICD-10-CM | POA: Diagnosis not present

## 2022-01-13 DIAGNOSIS — Z79899 Other long term (current) drug therapy: Secondary | ICD-10-CM | POA: Diagnosis not present

## 2022-01-13 DIAGNOSIS — F17210 Nicotine dependence, cigarettes, uncomplicated: Secondary | ICD-10-CM | POA: Diagnosis not present

## 2022-01-13 DIAGNOSIS — G8929 Other chronic pain: Secondary | ICD-10-CM

## 2022-01-13 DIAGNOSIS — C7931 Secondary malignant neoplasm of brain: Secondary | ICD-10-CM | POA: Diagnosis not present

## 2022-01-13 LAB — CBC AND DIFFERENTIAL
HCT: 39 (ref 36–46)
Hemoglobin: 12.5 (ref 12.0–16.0)
Neutrophils Absolute: 4.72
Platelets: 212 10*3/uL (ref 150–400)
WBC: 8.9

## 2022-01-13 LAB — HEPATIC FUNCTION PANEL
ALT: 18 U/L (ref 7–35)
AST: 26 (ref 13–35)
Alkaline Phosphatase: 116 (ref 25–125)
Bilirubin, Total: 0.6

## 2022-01-13 LAB — COMPREHENSIVE METABOLIC PANEL
Albumin: 4.3 (ref 3.5–5.0)
Calcium: 9.7 (ref 8.7–10.7)

## 2022-01-13 LAB — CBC
MCV: 86 (ref 81–99)
RBC: 4.58 (ref 3.87–5.11)

## 2022-01-13 LAB — TSH: TSH: 2.55 u[IU]/mL (ref 0.350–4.500)

## 2022-01-13 LAB — BASIC METABOLIC PANEL
BUN: 15 (ref 4–21)
CO2: 23 — AB (ref 13–22)
Chloride: 105 (ref 99–108)
Creatinine: 0.9 (ref 0.5–1.1)
Glucose: 128
Potassium: 4.7 mEq/L (ref 3.5–5.1)
Sodium: 139 (ref 137–147)

## 2022-01-13 MED ORDER — OXYCODONE HCL 10 MG PO TABS: 10.0000 mg | ORAL_TABLET | Freq: Four times a day (QID) | ORAL | 0 refills | Status: DC | PRN

## 2022-01-14 LAB — T4: T4, Total: 9.2 ug/dL (ref 4.5–12.0)

## 2022-01-16 ENCOUNTER — Other Ambulatory Visit: Payer: Self-pay

## 2022-01-16 MED FILL — Pembrolizumab IV Soln 100 MG/4ML (25 MG/ML): INTRAVENOUS | Qty: 8 | Status: AC

## 2022-01-17 ENCOUNTER — Inpatient Hospital Stay: Payer: Medicare Other

## 2022-01-17 VITALS — BP 112/62 | HR 64 | Temp 98.2°F | Resp 18 | Wt 127.2 lb

## 2022-01-17 DIAGNOSIS — C7931 Secondary malignant neoplasm of brain: Secondary | ICD-10-CM | POA: Diagnosis not present

## 2022-01-17 DIAGNOSIS — C3431 Malignant neoplasm of lower lobe, right bronchus or lung: Secondary | ICD-10-CM

## 2022-01-17 DIAGNOSIS — Z79899 Other long term (current) drug therapy: Secondary | ICD-10-CM | POA: Diagnosis not present

## 2022-01-17 DIAGNOSIS — Z5112 Encounter for antineoplastic immunotherapy: Secondary | ICD-10-CM | POA: Diagnosis not present

## 2022-01-17 MED ORDER — SODIUM CHLORIDE 0.9 % IV SOLN
200.0000 mg | Freq: Once | INTRAVENOUS | Status: AC
  Administered 2022-01-17: 200 mg via INTRAVENOUS
  Filled 2022-01-17: qty 8

## 2022-01-17 MED ORDER — SODIUM CHLORIDE 0.9 % IV SOLN: Freq: Once | INTRAVENOUS | Status: AC

## 2022-01-17 NOTE — Patient Instructions (Signed)
[facility]  Discharge Instructions: Thank you for choosing [facility] to provide your oncology and hematology care.  If you have a lab appointment with the [facility], please go directly to the [facility] and check in at the registration area.   Wear comfortable clothing and clothing appropriate for easy access to any Portacath or PICC line.   We strive to give you quality time with your provider. You may need to reschedule your appointment if you arrive late (15 or more minutes).  Arriving late affects you and other patients whose appointments are after yours.  Also, if you miss three or more appointments without notifying the office, you may be dismissed from the clinic at the provider's discretion.      For prescription refill requests, have your pharmacy contact our office and allow 72 hours for refills to be completed.    Today you received the following chemotherapy and/or immunotherapy agents Beryle Flock   To help prevent nausea and vomiting after your treatment, we encourage you to take your nausea medication as directed.  BELOW ARE SYMPTOMS THAT SHOULD BE REPORTED IMMEDIATELY: *FEVER GREATER THAN 100.4 F (38 C) OR HIGHER *CHILLS OR SWEATING *NAUSEA AND VOMITING THAT IS NOT CONTROLLED WITH YOUR NAUSEA MEDICATION *UNUSUAL SHORTNESS OF BREATH *UNUSUAL BRUISING OR BLEEDING *URINARY PROBLEMS (pain or burning when urinating, or frequent urination) *BOWEL PROBLEMS (unusual diarrhea, constipation, pain near the anus) TENDERNESS IN MOUTH AND THROAT WITH OR WITHOUT PRESENCE OF ULCERS (sore throat, sores in mouth, or a toothache) UNUSUAL RASH, SWELLING OR PAIN  UNUSUAL VAGINAL DISCHARGE OR ITCHING   Items with * indicate a potential emergency and should be followed up as soon as possible or go to the Emergency Department if any problems should occur.  Please show the CHEMOTHERAPY ALERT CARD or IMMUNOTHERAPY ALERT CARD at check-in to the  Emergency Department and triage nurse.  Should you have questions after your visit or need to cancel or reschedule your appointment, please contact [facility]  Dept: [phone]  and follow the prompts.  Office hours are 8:00 a.m. to 4:30 p.m. Monday - Friday. Please note that voicemails left after 4:00 p.m. may not be returned until the following business day.  We are closed weekends and major holidays. You have access to a nurse at all times for urgent questions. Please call the main number to the clinic Dept: [phone] and follow the prompts.  For any non-urgent questions, you may also contact your provider using MyChart. We now offer e-Visits for anyone 65 and older to request care online for non-urgent symptoms. For details visit [email or web address].   Also download the MyChart app! Go to the app store, search "MyChart", open the app, select [facility], and log in with your MyChart username and password.  Masks are optional in the cancer centers. If you would like for your care team to wear a mask while they are taking care of you, please let them know. For doctor visits, patients may have with them one support person who is at least 77 years old. At this time, visitors are not allowed in the infusion area.  Pembrolizumab injection What is this medication? PEMBROLIZUMAB (pem broe liz ue mab) is a monoclonal antibody. It is used to treat certain types of cancer. This medicine may be used for other purposes; ask your health care provider or pharmacist if you have questions. COMMON BRAND NAME(S): Keytruda What should I tell my care team  before I take this medication? They need to know if you have any of these conditions: autoimmune diseases like Crohn's disease, ulcerative colitis, or lupus have had or planning to have an allogeneic stem cell transplant (uses someone else's stem cells) history of organ transplant history of chest radiation nervous system  problems like myasthenia gravis or Guillain-Barre syndrome an unusual or allergic reaction to pembrolizumab, other medicines, foods, dyes, or preservatives pregnant or trying to get pregnant breast-feeding How should I use this medication? This medicine is for infusion into a vein. It is given by a health care professional in a hospital or clinic setting. A special MedGuide will be given to you before each treatment. Be sure to read this information carefully each time. Talk to your pediatrician regarding the use of this medicine in children. While this drug may be prescribed for children as young as 6 months for selected conditions, precautions do apply. Overdosage: If you think you have taken too much of this medicine contact a poison control center or emergency room at once. NOTE: This medicine is only for you. Do not share this medicine with others. What if I miss a dose? It is important not to miss your dose. Call your doctor or health care professional if you are unable to keep an appointment. What may interact with this medication? Interactions have not been studied. This list may not describe all possible interactions. Give your health care provider a list of all the medicines, herbs, non-prescription drugs, or dietary supplements you use. Also tell them if you smoke, drink alcohol, or use illegal drugs. Some items may interact with your medicine. What should I watch for while using this medication? Your condition will be monitored carefully while you are receiving this medicine. You may need blood work done while you are taking this medicine. Do not become pregnant while taking this medicine or for 4 months after stopping it. Women should inform their doctor if they wish to become pregnant or think they might be pregnant. There is a potential for serious side effects to an unborn child. Talk to your health care professional or pharmacist for more information. Do not breast-feed an infant  while taking this medicine or for 4 months after the last dose. What side effects may I notice from receiving this medication? Side effects that you should report to your doctor or health care professional as soon as possible: allergic reactions like skin rash, itching or hives, swelling of the face, lips, or tongue bloody or black, tarry breathing problems changes in vision chest pain chills confusion constipation cough diarrhea dizziness or feeling faint or lightheaded fast or irregular heartbeat fever flushing joint pain low blood counts - this medicine may decrease the number of white blood cells, red blood cells and platelets. You may be at increased risk for infections and bleeding. muscle pain muscle weakness pain, tingling, numbness in the hands or feet persistent headache redness, blistering, peeling or loosening of the skin, including inside the mouth signs and symptoms of high blood sugar such as dizziness; dry mouth; dry skin; fruity breath; nausea; stomach pain; increased hunger or thirst; increased urination signs and symptoms of kidney injury like trouble passing urine or change in the amount of urine signs and symptoms of liver injury like dark urine, light-colored stools, loss of appetite, nausea, right upper belly pain, yellowing of the eyes or skin sweating swollen lymph nodes weight loss Side effects that usually do not require medical attention (report to your doctor or  health care professional if they continue or are bothersome): decreased appetite hair loss tiredness This list may not describe all possible side effects. Call your doctor for medical advice about side effects. You may report side effects to FDA at 1-800-FDA-1088. Where should I keep my medication? This drug is given in a hospital or clinic and will not be stored at home. NOTE: This sheet is a summary. It may not cover all possible information. If you have questions about this medicine, talk  to your doctor, pharmacist, or health care provider.  2023 Elsevier/Gold Standard (2021-05-13 00:00:00)

## 2022-01-22 ENCOUNTER — Encounter: Payer: Self-pay | Admitting: Oncology

## 2022-01-23 ENCOUNTER — Other Ambulatory Visit: Payer: Self-pay | Admitting: Hematology and Oncology

## 2022-01-23 MED ORDER — OXYCODONE HCL 10 MG PO TABS: 10.0000 mg | ORAL_TABLET | Freq: Four times a day (QID) | ORAL | 0 refills | Status: DC | PRN

## 2022-01-30 ENCOUNTER — Encounter: Payer: Self-pay | Admitting: Oncology

## 2022-02-03 ENCOUNTER — Inpatient Hospital Stay: Payer: Medicare Other

## 2022-02-03 ENCOUNTER — Encounter: Payer: Self-pay | Admitting: Hematology and Oncology

## 2022-02-03 ENCOUNTER — Inpatient Hospital Stay: Payer: Medicare Other | Attending: Oncology | Admitting: Hematology and Oncology

## 2022-02-03 DIAGNOSIS — C3431 Malignant neoplasm of lower lobe, right bronchus or lung: Secondary | ICD-10-CM

## 2022-02-03 DIAGNOSIS — C7931 Secondary malignant neoplasm of brain: Secondary | ICD-10-CM | POA: Diagnosis not present

## 2022-02-03 DIAGNOSIS — Z79899 Other long term (current) drug therapy: Secondary | ICD-10-CM | POA: Insufficient documentation

## 2022-02-03 DIAGNOSIS — Z5112 Encounter for antineoplastic immunotherapy: Secondary | ICD-10-CM | POA: Insufficient documentation

## 2022-02-03 DIAGNOSIS — D649 Anemia, unspecified: Secondary | ICD-10-CM | POA: Diagnosis not present

## 2022-02-03 LAB — CBC AND DIFFERENTIAL
HCT: 36 (ref 36–46)
Hemoglobin: 11.8 — AB (ref 12.0–16.0)
Neutrophils Absolute: 5.04
Platelets: 234 10*3/uL (ref 150–400)
WBC: 8.4

## 2022-02-03 LAB — TSH: TSH: 2.852 u[IU]/mL (ref 0.350–4.500)

## 2022-02-03 LAB — BASIC METABOLIC PANEL
BUN: 13 (ref 4–21)
CO2: 26 — AB (ref 13–22)
Chloride: 106 (ref 99–108)
Creatinine: 0.8 (ref 0.5–1.1)
Glucose: 118
Potassium: 4.2 mEq/L (ref 3.5–5.1)
Sodium: 140 (ref 137–147)

## 2022-02-03 LAB — COMPREHENSIVE METABOLIC PANEL
Albumin: 4.3 (ref 3.5–5.0)
Calcium: 9.4 (ref 8.7–10.7)

## 2022-02-03 LAB — CBC: RBC: 4.33 (ref 3.87–5.11)

## 2022-02-03 LAB — HEPATIC FUNCTION PANEL
ALT: 16 U/L (ref 7–35)
AST: 22 (ref 13–35)
Alkaline Phosphatase: 103 (ref 25–125)
Bilirubin, Total: 0.6

## 2022-02-03 NOTE — Assessment & Plan Note (Signed)
Brain metastases diagnosed in April 2022. This presented with right sided head pain and pain of the right eye. She received stereotactic radiation to the two brain lesions. MRI brain in September revealed four metastatic deposits in the brain. Several show mild associated hemorrhage. Mild edema. No mass-effect or midline shift.  She was placed on immunotherapy with pembrolizumab every 3 weeks in October.  MRI brain in January revealed a mixed response with one new enhancing lesion in the right cerebellar hemisphere measuring 3 mm. One lesion in the left frontal lobe has resolved, one lesion in the right occipital lobe is stable, and two lesions were mildly decreased in size.    MRI brain in May revealed resolution of the right cerebellar lesion and decreased size of the right occipital and bilateral frontal lesions.

## 2022-02-03 NOTE — Progress Notes (Signed)
Patient Care Team: Townsend Roger, MD as PCP - General (Internal Medicine) Sharmon Revere as Physician Assistant (Cardiology)  Clinic Day:  02/03/2022  Referring physician: Townsend Roger, MD  ASSESSMENT & PLAN:   Assessment & Plan: Lung cancer, lower lobe ([name]) Stage IV lung cancer diagnosed in April 2022.  She received 1 dose of chemotherapy in June 2022, with combination cisplatin/paclitaxel/pembrolizumab, and tolerated this very poorly.  She ended up in the hospital twice.  Since October 2022, she has been receiving palliative pembrolizumab and has tolerated this well.  She took a break and went to [place] for a time and so she missed some doses.  Also, while she was away, she fell and had a left hip fracture requiring surgery.  She was seen in May with a CT chest and MRI head prior to resuming pembrolizumab after her break.  Her disease appeared stable, so she has continued with pembrolizumab every 3 weeks. Today, she complains of weakness and fatigue. I will check iron studies as well as B12. She will proceed with cycle 12 next week, but is asking to have an extra week prior to cycle 13. So we will see her back in 4 weeks for repeat evaluation.   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  This presented with right sided head pain and pain of the right eye.  She received stereotactic radiation to the two brain lesions. MRI brain in September revealed four metastatic deposits in the brain. Several show mild associated hemorrhage. Mild edema. No mass-effect or midline shift.  She was placed on immunotherapy with pembrolizumab every 3 weeks in October.  MRI brain in January revealed a mixed response with one new enhancing lesion in the right cerebellar hemisphere measuring 3 mm. One lesion in the left frontal lobe has resolved, one lesion in the right occipital lobe is stable, and two lesions were mildly decreased in size.    MRI brain in May  revealed resolution of the right cerebellar lesion and decreased size of the right occipital and bilateral frontal lesions.    The patient understands the plans discussed today and is in agreement with them.  She knows to contact our office if she develops concerns prior to her next appointment.   [name], NP  [facility] [address] [place] [address] Dept: [phone] Dept Fax: [phone]   Orders Placed This Encounter  Procedures   CBC and differential    This external order was created through the Results Console.   CBC    This external order was created through the Results Console.      CHIEF COMPLAINT:  CC: A 76 year old female with history of lung cancer here for 3 week evaluation  Current Treatment:  Pembrolizumab  INTERVAL HISTORY:  [name] is here today for repeat clinical assessment. She denies fevers or chills. She denies pain. Her appetite is good. Her weight has been stable.  I have reviewed the past medical history, past surgical history, social history and family history with the patient and they are unchanged from previous note.  ALLERGIES:  is allergic to ergocalciferol.  MEDICATIONS:  Current Outpatient Medications  Medication Sig Dispense Refill   ALPRAZolam (XANAX) 0.5 MG tablet Take 1 tablet (0.5 mg total) by mouth 3 (three) times daily as needed for anxiety. 90 tablet 0   atorvastatin (LIPITOR) 80 MG tablet Take 80 mg by mouth  daily.     dexamethasone (DECADRON) 4 MG tablet Take 4 mg by mouth daily as needed.     gabapentin (NEURONTIN) 100 MG capsule Take by mouth.     glipiZIDE (GLUCOTROL XL) 5 MG 24 hr tablet Take 5 mg by mouth daily.     HUMULIN R 100 UNIT/ML injection      JANUVIA 100 MG tablet Take 100 mg by mouth daily.     losartan (COZAAR) 25 MG tablet Take 25 mg by mouth daily.     metoprolol tartrate (LOPRESSOR) 25 MG tablet Take 25 mg by mouth 2 (two) times  daily.     mupirocin cream (BACTROBAN) 2 % Apply topically.     ondansetron (ZOFRAN) 4 MG tablet Take 1 tablet (4 mg total) by mouth every 4 (four) hours as needed for nausea. 90 tablet 3   Oxycodone HCl 10 MG TABS Take 1 tablet (10 mg total) by mouth every 6 (six) hours as needed (for severe pain). 60 tablet 0   prochlorperazine (COMPAZINE) 10 MG tablet Take 1 tablet (10 mg total) by mouth every 6 (six) hours as needed for nausea or vomiting. 90 tablet 3   rivaroxaban (XARELTO) 20 MG TABS tablet Take 20 mg by mouth daily with supper.     No current facility-administered medications for this visit.    HISTORY OF PRESENT ILLNESS:   Oncology History  Lung cancer, lower lobe ([place])  07/07/2020 Initial Diagnosis   Lung cancer, lower lobe ([place])   10/15/2020 Cancer Staging   Staging form: Lung, AJCC 8th Edition - Clinical stage from 10/15/2020: Stage IV (cT1b, cN0, cM1) - Signed by Derwood Kaplan, MD on 03/31/2021 Histopathologic type: Squamous cell carcinoma, NOS Stage prefix: Initial diagnosis Histologic grade (G): G3 Histologic grading system: 4 grade system Laterality: Right Tumor size (mm): 18 Sites of metastasis: Brain Lymph-vascular invasion (LVI): Presence of LVI unknown/indeterminate Diagnostic confirmation: Positive histology Specimen type: Core Needle Biopsy Staged by: Managing physician Type of lung cancer: Locally advanced or metastatic non-small cell lung cancer Stage used in treatment planning: Yes National guidelines used in treatment planning: Yes Type of national guideline used in treatment planning: NCCN   04/08/2021 -  Chemotherapy   Patient is on Treatment Plan : LUNG NSCLC flat dose Pembrolizumab Q21D         REVIEW OF SYSTEMS:   Constitutional: Denies fevers, chills or abnormal weight loss Eyes: Denies blurriness of vision Ears, nose, mouth, throat, and face: Denies mucositis or sore throat Respiratory: Denies cough, dyspnea or wheezes Cardiovascular:  Denies palpitation, chest discomfort or lower extremity swelling Gastrointestinal:  Denies nausea, heartburn or change in bowel habits Skin: Denies abnormal skin rashes Lymphatics: Denies new lymphadenopathy or easy bruising Neurological:Denies numbness, tingling or new weaknesses Behavioral/Psych: Mood is stable, no new changes  All other systems were reviewed with the patient and are negative.   VITALS:  Blood pressure (!) 164/70, pulse (!) 59, temperature 97.7 F (36.5 C), temperature source Oral, resp. rate 18, height 5' 5.9" (1.674 m), weight 127 lb 6.4 oz (57.8 kg), SpO2 99 %.  Wt Readings from Last 3 Encounters:  02/03/22 127 lb 6.4 oz (57.8 kg)  01/17/22 127 lb 4 oz (57.7 kg)  01/13/22 126 lb 12.8 oz (57.5 kg)    Body mass index is 20.63 kg/m.  Performance status (ECOG): 1 - Symptomatic but completely ambulatory  PHYSICAL EXAM:   GENERAL:alert, no distress and comfortable SKIN: skin color, texture, turgor are normal, no rashes or significant  lesions EYES: normal, Conjunctiva are pink and non-injected, sclera clear OROPHARYNX:no exudate, no erythema and lips, buccal mucosa, and tongue normal  NECK: supple, thyroid normal size, non-tender, without nodularity LYMPH:  no palpable lymphadenopathy in the cervical, axillary or inguinal LUNGS: clear to auscultation and percussion with normal breathing effort HEART: regular rate & rhythm and no murmurs and no lower extremity edema ABDOMEN:abdomen soft, non-tender and normal bowel sounds Musculoskeletal:no cyanosis of digits and no clubbing  NEURO: alert & oriented x 3 with fluent speech, no focal motor/sensory deficits  LABORATORY DATA:  I have reviewed the data as listed    Component Value Date/Time   NA 139 01/13/2022 0000   K 4.7 01/13/2022 0000   CL 105 01/13/2022 0000   CO2 23 (A) 01/13/2022 0000   GLUCOSE 83 10/20/2016 1618   BUN 15 01/13/2022 0000   CREATININE 0.9 01/13/2022 0000   CREATININE 1.07 (H) 10/20/2016  1618   CALCIUM 9.7 01/13/2022 0000   PROT 6.7 08/26/2010 0051   ALBUMIN 4.3 01/13/2022 0000   AST 26 01/13/2022 0000   ALT 18 01/13/2022 0000   ALKPHOS 116 01/13/2022 0000   BILITOT 0.5 08/26/2010 0051   GFRNONAA 51 (L) 10/20/2016 1618   GFRAA 59 (L) 10/20/2016 1618    No results found for: "SPEP", "UPEP"  Lab Results  Component Value Date   WBC 8.4 02/03/2022   NEUTROABS 5.04 02/03/2022   HGB 11.8 (A) 02/03/2022   HCT 36 02/03/2022   MCV 86 01/13/2022   PLT 234 02/03/2022      Chemistry      Component Value Date/Time   NA 139 01/13/2022 0000   K 4.7 01/13/2022 0000   CL 105 01/13/2022 0000   CO2 23 (A) 01/13/2022 0000   BUN 15 01/13/2022 0000   CREATININE 0.9 01/13/2022 0000   CREATININE 1.07 (H) 10/20/2016 1618   GLU 128 01/13/2022 0000      Component Value Date/Time   CALCIUM 9.7 01/13/2022 0000   ALKPHOS 116 01/13/2022 0000   AST 26 01/13/2022 0000   ALT 18 01/13/2022 0000   BILITOT 0.5 08/26/2010 0051       RADIOGRAPHIC STUDIES: I have personally reviewed the radiological images as listed and agreed with the findings in the report. No results found.

## 2022-02-03 NOTE — Assessment & Plan Note (Signed)
Stage IV lung cancer diagnosed in April 2022.  She received 1 dose of chemotherapy in June 2022, withcombination cisplatin/paclitaxel/pembrolizumab, and tolerated this very poorly.  She ended up in the hospitaltwice.  Since October 2022, she has been receiving palliative pembrolizumab and has tolerated this well.  She took a break and went to [place] for a time and so she missed some doses.  Also, while she was away, she fell and had a left hip fracture requiring surgery.  She was seen in May with a CT chest and MRI head prior to resuming pembrolizumab after her break.  Her disease appeared stable, so she has continued with pembrolizumab every 3 weeks. Today, she complains of weakness and fatigue. I will check iron studies as well as B12. She will proceed with cycle 12 next week, but is asking to have an extra week prior to cycle 13. So we will see her back in 4 weeks for repeat evaluation.

## 2022-02-04 ENCOUNTER — Other Ambulatory Visit: Payer: Self-pay

## 2022-02-04 LAB — T4: T4, Total: 9 ug/dL (ref 4.5–12.0)

## 2022-02-06 ENCOUNTER — Other Ambulatory Visit: Payer: Self-pay | Admitting: Oncology

## 2022-02-06 MED FILL — Pembrolizumab IV Soln 100 MG/4ML (25 MG/ML): INTRAVENOUS | Qty: 8 | Status: AC

## 2022-02-07 ENCOUNTER — Inpatient Hospital Stay: Payer: Medicare Other

## 2022-02-07 VITALS — BP 141/61 | HR 67 | Temp 97.9°F | Resp 18 | Ht 65.9 in | Wt 126.0 lb

## 2022-02-07 DIAGNOSIS — C3431 Malignant neoplasm of lower lobe, right bronchus or lung: Secondary | ICD-10-CM

## 2022-02-07 DIAGNOSIS — Z5112 Encounter for antineoplastic immunotherapy: Secondary | ICD-10-CM | POA: Diagnosis not present

## 2022-02-07 DIAGNOSIS — Z79899 Other long term (current) drug therapy: Secondary | ICD-10-CM | POA: Diagnosis not present

## 2022-02-07 DIAGNOSIS — C7931 Secondary malignant neoplasm of brain: Secondary | ICD-10-CM | POA: Diagnosis not present

## 2022-02-07 MED ORDER — SODIUM CHLORIDE 0.9 % IV SOLN: Freq: Once | INTRAVENOUS | Status: AC

## 2022-02-07 MED ORDER — SODIUM CHLORIDE 0.9 % IV SOLN
200.0000 mg | Freq: Once | INTRAVENOUS | Status: AC
  Administered 2022-02-07: 200 mg via INTRAVENOUS
  Filled 2022-02-07: qty 8

## 2022-02-07 MED ORDER — HEPARIN SOD (PORK) LOCK FLUSH 100 UNIT/ML IV SOLN
500.0000 [IU] | Freq: Once | INTRAVENOUS | Status: AC | PRN
  Administered 2022-02-07: 500 [IU]

## 2022-02-07 MED ORDER — SODIUM CHLORIDE 0.9% FLUSH
10.0000 mL | INTRAVENOUS | Status: DC | PRN
  Administered 2022-02-07: 10 mL

## 2022-02-07 NOTE — Patient Instructions (Signed)

## 2022-02-23 ENCOUNTER — Other Ambulatory Visit: Payer: Self-pay

## 2022-02-23 NOTE — Progress Notes (Signed)
Patient Care Team: Raven Roger, MD as PCP - General (Internal Medicine) [name] as Physician Assistant (Cardiology)  Clinic Day: 02/24/22  Referring physician: Townsend Roger, MD  ASSESSMENT & PLAN:   Assessment & Plan: Lung cancer, lower lobe ([place]) Stage IV lung cancer diagnosed in April 2022.  She received 1 dose of chemotherapy in June 2022, with combination cisplatin/paclitaxel/pembrolizumab, and tolerated this very poorly.  She ended up in the hospital twice.  Since October 2022, she has been receiving palliative pembrolizumab and has tolerated this well.  She took a break and went to [place] for a time and so she missed some doses.  Also, while she was away, she fell and had a left hip fracture requiring surgery.  She was seen in May with a CT chest and MRI head prior to resuming pembrolizumab after her break.  Her disease appeared stable, so she has continued with pembrolizumab.   Malignant neoplasm metastatic to brain [facility]) Brain metastases diagnosed in April 2022.  This presented with right sided head pain and pain of the right eye.  She received stereotactic radiation to the two brain lesions. MRI brain in September revealed four metastatic deposits in the brain. Several show mild associated hemorrhage. Mild edema. No mass-effect or midline shift.  She was placed on immunotherapy with pembrolizumab every 3 weeks in October.  MRI brain in January revealed a mixed response with one new enhancing lesion in the right cerebellar hemisphere measuring 3 mm. One lesion in the left frontal lobe has resolved, one lesion in the right occipital lobe is stable, and two lesions were mildly decreased in size.    MRI brain in May revealed resolution of the right cerebellar lesion and decreased size of the right occipital and bilateral frontal lesions.  Worsening anemia Today, she complains of weakness and fatigue. I will check iron studies as well as B12.    She will proceed with  cycle 12 next week. I will call her with the results of the lab tests.  will increase the pantoprazole to twice daily and use TUMS as needed. We will hold off on aggressive GI evaluation in view of her PS and comorbidities. We gave her some Ensure samples and coupons today. She will return in 3 weeks with CBC and CMP for her next cycle. The patient understands the plans discussed today and is in agreement with them.  She knows to contact our office if she develops concerns prior to her next appointment.  Raven Kaplan, MD  [facility] [address] [place] [address] Dept: [phone] Dept Fax: [phone]   Orders Placed This Encounter  Procedures   CBC and differential    This external order was created through the Results Console.   CBC    This external order was created through the Results Console.   Basic metabolic panel    This external order was created through the Results Console.   Comprehensive metabolic panel    This external order was created through the Results Console.   Hepatic function panel    This external order was created through the Results Console.      CHIEF COMPLAINT:  CC: A 76 year old female with history of lung cancer here for 3 week evaluation  Current Treatment:  Pembrolizumab  INTERVAL HISTORY:  [name] is here today for repeat clinical assessment. She says she has no appetite. Her hemoglobin has decreased from 11.8  to 10.8 since her last visit. She feels weak and fatigued.  I will check iron studies as well as B12 and folate. She does describe occasional black stools.  We will give her some Ensure samples and coupons. I will have her increase her pantoprazole to twice daily and use TUMS as needed, which will help her calcium as well.  She denies fevers or chills. She denies pain. Her appetite is good. Her weight has been stable.  I have reviewed the past medical history, past  surgical history, social history and family history with the patient and they are unchanged from previous note.  ALLERGIES:  is allergic to ergocalciferol.  MEDICATIONS:  Current Outpatient Medications  Medication Sig Dispense Refill   albuterol (VENTOLIN HFA) 108 (90 Base) MCG/ACT inhaler Inhale 2 puffs into the lungs every 4 (four) hours as needed.     ALPRAZolam (XANAX) 0.5 MG tablet Take 1 tablet (0.5 mg total) by mouth 3 (three) times daily as needed for anxiety. 90 tablet 0   atorvastatin (LIPITOR) 80 MG tablet Take 80 mg by mouth daily.     ATROVENT HFA 17 MCG/ACT inhaler Inhale into the lungs.     gabapentin (NEURONTIN) 100 MG capsule Take by mouth.     glipiZIDE (GLUCOTROL XL) 5 MG 24 hr tablet Take 5 mg by mouth daily.     HUMULIN R 100 UNIT/ML injection      JANUVIA 100 MG tablet Take 100 mg by mouth daily.     losartan (COZAAR) 25 MG tablet Take 25 mg by mouth daily.     metoprolol tartrate (LOPRESSOR) 25 MG tablet Take 25 mg by mouth 2 (two) times daily.     mupirocin cream (BACTROBAN) 2 % Apply topically.     ondansetron (ZOFRAN) 4 MG tablet Take 1 tablet (4 mg total) by mouth every 4 (four) hours as needed for nausea. 90 tablet 3   Oxycodone HCl 10 MG TABS Take 1 tablet (10 mg total) by mouth every 6 (six) hours as needed (for severe pain). 60 tablet 0   pantoprazole (PROTONIX) 40 MG tablet Take 1 tablet (40 mg total) by mouth 2 (two) times daily. 60 tablet 5   prochlorperazine (COMPAZINE) 10 MG tablet Take 1 tablet (10 mg total) by mouth every 6 (six) hours as needed for nausea or vomiting. 90 tablet 3   rivaroxaban (XARELTO) 20 MG TABS tablet Take 20 mg by mouth daily with supper.     No current facility-administered medications for this visit.    HISTORY OF PRESENT ILLNESS:   Oncology History  Lung cancer, lower lobe ([place])  07/07/2020 Initial Diagnosis   Lung cancer, lower lobe ([place])   10/15/2020 Cancer Staging   Staging form: Lung, AJCC 8th Edition - Clinical  stage from 10/15/2020: Stage IV (cT1b, cN0, cM1) - Signed by Raven Kaplan, MD on 03/31/2021 Histopathologic type: Squamous cell carcinoma, NOS Stage prefix: Initial diagnosis Histologic grade (G): G3 Histologic grading system: 4 grade system Laterality: Right Tumor size (mm): 18 Sites of metastasis: Brain Lymph-vascular invasion (LVI): Presence of LVI unknown/indeterminate Diagnostic confirmation: Positive histology Specimen type: Core Needle Biopsy Staged by: Managing physician Type of lung cancer: Locally advanced or metastatic non-small cell lung cancer Stage used in treatment planning: Yes National guidelines used in treatment planning: Yes Type of national guideline used in treatment planning: NCCN   04/08/2021 - 02/07/2022 Chemotherapy   Patient is on Treatment Plan : LUNG NSCLC flat dose Pembrolizumab Q21D  04/08/2021 -  Chemotherapy   Patient is on Treatment Plan : LUNG NSCLC Pembrolizumab (200) q21d         REVIEW OF SYSTEMS:   Constitutional: Denies fevers, chills or abnormal weight loss Eyes: Denies blurriness of vision Ears, nose, mouth, throat, and face: Denies mucositis or sore throat Respiratory: Denies cough, dyspnea or wheezes Cardiovascular: Denies palpitation, chest discomfort or lower extremity swelling Gastrointestinal:  Denies nausea, heartburn or change in bowel habits Skin: Denies abnormal skin rashes Lymphatics: Denies new lymphadenopathy or easy bruising Neurological:Denies numbness, tingling or new weaknesses Behavioral/Psych: Mood is stable, no new changes  All other systems were reviewed with the patient and are negative.   VITALS:  Blood pressure (!) 147/68, pulse (!) 56, temperature (!) 97.5 F (36.4 C), temperature source Oral, resp. rate 20, height 5' 5.9" (1.674 m), weight 127 lb 9.6 oz (57.9 kg), SpO2 98 %.  Wt Readings from Last 3 Encounters:  03/17/22 126 lb 3.2 oz (57.2 kg)  03/07/22 129 lb 11.2 oz (58.8 kg)  02/24/22 127 lb  9.6 oz (57.9 kg)    Body mass index is 20.66 kg/m.  Performance status (ECOG): 1 - Symptomatic but completely ambulatory  PHYSICAL EXAM:   GENERAL:alert, no distress and comfortable SKIN: skin color, texture, turgor are normal, no rashes or significant lesions EYES: normal, Conjunctiva are pink and non-injected, sclera clear OROPHARYNX:no exudate, no erythema and lips, buccal mucosa, and tongue normal  NECK: supple, thyroid normal size, non-tender, without nodularity LYMPH:  no palpable lymphadenopathy in the cervical, axillary or inguinal LUNGS: clear to auscultation and percussion with normal breathing effort HEART: regular rate & rhythm and no murmurs and no lower extremity edema ABDOMEN:abdomen soft, non-tender and normal bowel sounds Musculoskeletal:no cyanosis of digits and no clubbing  NEURO: alert & oriented x 3 with fluent speech, no focal motor/sensory deficits  LABORATORY DATA:  I have reviewed the data as listed    Component Value Date/Time   NA 138 03/17/2022 0000   K 3.8 03/17/2022 0000   CL 106 03/17/2022 0000   CO2 23 (A) 03/17/2022 0000   GLUCOSE 83 10/20/2016 1618   BUN 17 03/17/2022 0000   CREATININE 0.9 03/17/2022 0000   CREATININE 1.07 (H) 10/20/2016 1618   CALCIUM 9.4 03/17/2022 0000   PROT 6.7 08/26/2010 0051   ALBUMIN 4.0 03/17/2022 0000   AST 20 03/17/2022 0000   ALT 17 03/17/2022 0000   ALKPHOS 113 03/17/2022 0000   BILITOT 0.5 08/26/2010 0051   GFRNONAA 51 (L) 10/20/2016 1618   GFRAA 59 (L) 10/20/2016 1618    No results found for: "SPEP", "UPEP"  Lab Results  Component Value Date   WBC 11.2 03/17/2022   NEUTROABS 7.73 03/17/2022   HGB 9.5 (A) 03/17/2022   HCT 30 (A) 03/17/2022   MCV 80 (A) 03/17/2022   PLT 227 03/17/2022      Chemistry      Component Value Date/Time   NA 138 03/17/2022 0000   K 3.8 03/17/2022 0000   CL 106 03/17/2022 0000   CO2 23 (A) 03/17/2022 0000   BUN 17 03/17/2022 0000   CREATININE 0.9 03/17/2022 0000    CREATININE 1.07 (H) 10/20/2016 1618   GLU 76 03/17/2022 0000      Component Value Date/Time   CALCIUM 9.4 03/17/2022 0000   ALKPHOS 113 03/17/2022 0000   AST 20 03/17/2022 0000   ALT 17 03/17/2022 0000   BILITOT 0.5 08/26/2010 0051       RADIOGRAPHIC  STUDIES: I have personally reviewed the radiological images as listed and agreed with the findings in the report. MR Brain W Wo Contrast  Result Date: 03/16/2022 CLINICAL DATA:  Lung cancer, metastatic to brain EXAM: MRI HEAD WITHOUT AND WITH CONTRAST TECHNIQUE: Multiplanar, multiecho pulse sequences of the brain and surrounding structures were obtained without and with intravenous contrast. CONTRAST:  59mL GADAVIST GADOBUTROL 1 MMOL/ML IV SOLN COMPARISON:  11/07/2021 FINDINGS: Brain: No new enhancing lesion is seen. Redemonstrated 4 mm faintly peripherally enhancing lesion in the right occipital lobe (series 16, image 77), unchanged. Punctate focus of enhancement in the left frontal operculum (series 16, image 88), unchanged. Unchanged small area of cortical enhancement in the posterior right frontal lobe measuring up to 4 mm (series 16, image 113), unchanged. These lesions are associated with hemosiderin deposition. No restricted diffusion to suggest acute or subacute infarct. No acute hemorrhage, mass effect, or midline shift. No hydrocephalus or extra-axial collection. 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 Confluent T2 hyperintense signal in the periventricular white matter, likely the sequela of moderate to severe chronic small vessel ischemic disease. Vascular: Normal arterial flow voids. Normal arterial and venous enhancement. Skull and upper cervical spine: Normal marrow signal. Sinuses/Orbits: No acute finding.  Left lens replacement. Other: Trace fluid in left mastoid air cells. IMPRESSION: No new enhancing lesions. Unchanged appearance of previously noted right  frontal, right occipital, and left frontal lesions. Electronically Signed   By: Merilyn Baba M.D.   On: 03/16/2022 16:31

## 2022-02-24 ENCOUNTER — Inpatient Hospital Stay: Payer: Medicare Other | Attending: Oncology

## 2022-02-24 ENCOUNTER — Inpatient Hospital Stay (INDEPENDENT_AMBULATORY_CARE_PROVIDER_SITE_OTHER): Payer: Medicare Other | Admitting: Oncology

## 2022-02-24 ENCOUNTER — Encounter: Payer: Self-pay | Admitting: Oncology

## 2022-02-24 ENCOUNTER — Other Ambulatory Visit: Payer: Self-pay | Admitting: Oncology

## 2022-02-24 VITALS — BP 147/68 | HR 56 | Temp 97.5°F | Resp 20 | Ht 65.9 in | Wt 127.6 lb

## 2022-02-24 DIAGNOSIS — Z79899 Other long term (current) drug therapy: Secondary | ICD-10-CM | POA: Insufficient documentation

## 2022-02-24 DIAGNOSIS — C3431 Malignant neoplasm of lower lobe, right bronchus or lung: Secondary | ICD-10-CM

## 2022-02-24 DIAGNOSIS — D649 Anemia, unspecified: Secondary | ICD-10-CM | POA: Diagnosis not present

## 2022-02-24 DIAGNOSIS — C7931 Secondary malignant neoplasm of brain: Secondary | ICD-10-CM

## 2022-02-24 DIAGNOSIS — D539 Nutritional anemia, unspecified: Secondary | ICD-10-CM | POA: Insufficient documentation

## 2022-02-24 DIAGNOSIS — K2901 Acute gastritis with bleeding: Secondary | ICD-10-CM | POA: Diagnosis not present

## 2022-02-24 LAB — COMPREHENSIVE METABOLIC PANEL
Albumin: 4.2 (ref 3.5–5.0)
Calcium: 9.3 (ref 8.7–10.7)

## 2022-02-24 LAB — HEPATIC FUNCTION PANEL
ALT: 17 U/L (ref 7–35)
AST: 24 (ref 13–35)
Alkaline Phosphatase: 100 (ref 25–125)
Bilirubin, Total: 0.6

## 2022-02-24 LAB — CBC AND DIFFERENTIAL
HCT: 34 — AB (ref 36–46)
Hemoglobin: 10.8 — AB (ref 12.0–16.0)
Neutrophils Absolute: 3.71
Platelets: 199 10*3/uL (ref 150–400)
WBC: 8.4

## 2022-02-24 LAB — BASIC METABOLIC PANEL
BUN: 13 (ref 4–21)
CO2: 27 — AB (ref 13–22)
Chloride: 105 (ref 99–108)
Creatinine: 0.7 (ref 0.5–1.1)
Glucose: 112
Potassium: 4.3 mEq/L (ref 3.5–5.1)
Sodium: 139 (ref 137–147)

## 2022-02-24 LAB — IRON AND TIBC
Iron: 40 ug/dL (ref 28–170)
Saturation Ratios: 10 % — ABNORMAL LOW (ref 10.4–31.8)
TIBC: 419 ug/dL (ref 250–450)
UIBC: 379 ug/dL

## 2022-02-24 LAB — CBC: RBC: 4.1 (ref 3.87–5.11)

## 2022-02-24 LAB — FOLATE: Folate: 14.9 ng/mL (ref >5.9)

## 2022-02-24 LAB — TSH: TSH: 2.465 u[IU]/mL (ref 0.350–4.500)

## 2022-02-24 LAB — FERRITIN: Ferritin: 9 ng/mL — ABNORMAL LOW (ref 11–307)

## 2022-02-24 LAB — VITAMIN B12: Vitamin B-12: 259 pg/mL (ref 180–914)

## 2022-02-24 MED ORDER — PANTOPRAZOLE SODIUM 40 MG PO TBEC: 40.0000 mg | DELAYED_RELEASE_TABLET | Freq: Every day | ORAL | 5 refills | Status: DC

## 2022-02-25 ENCOUNTER — Other Ambulatory Visit: Payer: Self-pay | Admitting: Pharmacist

## 2022-02-25 ENCOUNTER — Other Ambulatory Visit: Payer: Self-pay

## 2022-02-25 DIAGNOSIS — C3431 Malignant neoplasm of lower lobe, right bronchus or lung: Secondary | ICD-10-CM

## 2022-02-25 LAB — T4: T4, Total: 9.9 ug/dL (ref 4.5–12.0)

## 2022-03-01 MED FILL — Pembrolizumab IV Soln 100 MG/4ML (25 MG/ML): INTRAVENOUS | Qty: 8 | Status: AC

## 2022-03-02 ENCOUNTER — Inpatient Hospital Stay: Payer: Medicare Other

## 2022-03-02 ENCOUNTER — Ambulatory Visit: Payer: Medicare Other

## 2022-03-02 DIAGNOSIS — Z20822 Contact with and (suspected) exposure to covid-19: Secondary | ICD-10-CM | POA: Diagnosis not present

## 2022-03-02 DIAGNOSIS — I4891 Unspecified atrial fibrillation: Secondary | ICD-10-CM | POA: Diagnosis not present

## 2022-03-02 DIAGNOSIS — H9190 Unspecified hearing loss, unspecified ear: Secondary | ICD-10-CM | POA: Diagnosis not present

## 2022-03-02 DIAGNOSIS — H6121 Impacted cerumen, right ear: Secondary | ICD-10-CM | POA: Diagnosis not present

## 2022-03-02 DIAGNOSIS — Z77122 Contact with and (suspected) exposure to noise: Secondary | ICD-10-CM | POA: Diagnosis not present

## 2022-03-02 DIAGNOSIS — I2510 Atherosclerotic heart disease of native coronary artery without angina pectoris: Secondary | ICD-10-CM | POA: Diagnosis not present

## 2022-03-02 DIAGNOSIS — F17210 Nicotine dependence, cigarettes, uncomplicated: Secondary | ICD-10-CM | POA: Diagnosis not present

## 2022-03-02 DIAGNOSIS — H61303 Acquired stenosis of external ear canal, unspecified, bilateral: Secondary | ICD-10-CM | POA: Diagnosis not present

## 2022-03-02 DIAGNOSIS — H903 Sensorineural hearing loss, bilateral: Secondary | ICD-10-CM | POA: Diagnosis not present

## 2022-03-02 DIAGNOSIS — C3490 Malignant neoplasm of unspecified part of unspecified bronchus or lung: Secondary | ICD-10-CM | POA: Diagnosis not present

## 2022-03-02 DIAGNOSIS — J342 Deviated nasal septum: Secondary | ICD-10-CM | POA: Diagnosis not present

## 2022-03-02 DIAGNOSIS — J441 Chronic obstructive pulmonary disease with (acute) exacerbation: Secondary | ICD-10-CM | POA: Diagnosis not present

## 2022-03-02 DIAGNOSIS — R911 Solitary pulmonary nodule: Secondary | ICD-10-CM | POA: Diagnosis not present

## 2022-03-02 DIAGNOSIS — C3431 Malignant neoplasm of lower lobe, right bronchus or lung: Secondary | ICD-10-CM

## 2022-03-02 DIAGNOSIS — R0602 Shortness of breath: Secondary | ICD-10-CM | POA: Diagnosis not present

## 2022-03-02 DIAGNOSIS — E119 Type 2 diabetes mellitus without complications: Secondary | ICD-10-CM | POA: Diagnosis not present

## 2022-03-02 DIAGNOSIS — I10 Essential (primary) hypertension: Secondary | ICD-10-CM | POA: Diagnosis not present

## 2022-03-02 NOTE — Progress Notes (Signed)
PT PRESENTED TO CLINIC WITH INCREASING SHOB FOR 2 DAYS. ALSO HAS A DRY COUGH, AND PROFOUND FATIGUE.  DAUGHTER PRESENT WITH PT.  KELLI, PA AND SUSAN pharmD NOTIFIED AND ORDER TO TRANSFER PT TO [facility] ED FOR EVALUATION.  PT AND HER DAUGHTER STATE UNDERSTANDING AND ARE AGREEABLE TO THIS PLAN.

## 2022-03-03 ENCOUNTER — Encounter: Payer: Self-pay | Admitting: Oncology

## 2022-03-07 ENCOUNTER — Encounter: Payer: Self-pay | Admitting: Oncology

## 2022-03-07 ENCOUNTER — Inpatient Hospital Stay (INDEPENDENT_AMBULATORY_CARE_PROVIDER_SITE_OTHER): Payer: Medicare Other | Admitting: Oncology

## 2022-03-07 ENCOUNTER — Other Ambulatory Visit: Payer: Self-pay | Admitting: Oncology

## 2022-03-07 VITALS — BP 140/63 | HR 60 | Temp 97.7°F | Resp 20 | Ht 65.9 in | Wt 129.7 lb

## 2022-03-07 DIAGNOSIS — C3431 Malignant neoplasm of lower lobe, right bronchus or lung: Secondary | ICD-10-CM

## 2022-03-07 DIAGNOSIS — C7931 Secondary malignant neoplasm of brain: Secondary | ICD-10-CM

## 2022-03-07 NOTE — Progress Notes (Signed)
Patient Care Team: Townsend Roger, MD as PCP - General (Internal Medicine) Sharmon Revere as Physician Assistant (Cardiology)  Clinic Day: 03/07/2022  Referring physician: Townsend Roger, MD  ASSESSMENT & PLAN:   Assessment & Plan: Lung cancer, lower lobe ([place]) Stage IV lung cancer diagnosed in April 2022.  She received 1 dose of chemotherapy in June 2022, with combination cisplatin/paclitaxel/pembrolizumab, and tolerated this very poorly.  She ended up in the hospital twice.  Since October 2022, she has been receiving palliative pembrolizumab and has tolerated this well.  She took a break and went to [place] for a time and so she missed some doses.  Also, while she was away, she fell and had a left hip fracture requiring surgery.  She was seen in May with a CT chest and MRI head prior to resuming pembrolizumab after her break.  Her disease appeared stable, so she has continued with pembrolizumab every 3 weeks. Today, she complains of weakness and fatigue.    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  This presented with right sided head pain and pain of the right eye.  She received stereotactic radiation to the two brain lesions. MRI brain in September revealed four metastatic deposits in the brain. Several show mild associated hemorrhage. Mild edema. No mass-effect or midline shift.  She was placed on immunotherapy with pembrolizumab every 3 weeks in October.  MRI brain in January revealed a mixed response with one new enhancing lesion in the right cerebellar hemisphere measuring 3 mm. One lesion in the left frontal lobe has resolved, one lesion in the right occipital lobe is stable, and two lesions were mildly decreased in size.    MRI brain in May revealed resolution of the right cerebellar lesion and decreased size of the right occipital and bilateral frontal lesions.However she is now having neurologic symptoms, and I am concerned about  progression.    Iron deficiency anemia I suspect she is bleeding and has severe iron deficiency. We will start on oral supplements but consider IV iron if she is unable to tolerate it. She does complain of discomfort of her epigastric area, but I dont think I can get a GI evaluation immediately so will increase her Protonix.       She was in the emergency room 03/02/22, they completed a chest x-ray which revealed patchy airspace opacities in the right mid lower lung correspond to the irregular pulmonary nodules seen on most recent CT chest 11/07/2021, unchanged compared to prior exam. There was no new focal consolidation or pleural effusion. I am concerned about her increasing symptoms and feel we need to restage her with CT chest and MRI of the brain. We will put her treatments on hold while we do the evaluation. I will bring her back after these tests are done and repeat her CBC and CMP at that time. We can then make decisions on treatment moving forward. The patient understands the plans discussed today and is in agreement with them.  She knows to contact our office if she develops concerns prior to her next appointment.   Derwood Kaplan, MD  [place] [address] [place] [address] Dept: [phone] Dept Fax: [phone]   No orders of the defined types were placed in this encounter.     CHIEF COMPLAINT:  CC: A 76 year old female with history of lung cancer here for 3 week evaluation  Current Treatment:  Pembrolizumab  INTERVAL HISTORY:  [name] is here today for repeat clinical assessment. She states she has been feeling tired and wanting to stretch out and go to sleep. She notes she was having pain in her neck (left side) along with her left leg, not being able to lift it up and was carried out. She also states she is hurting between her shoulder blades. She was in the emergency room 03/02/22, they completed a  chest x-ray which revealed patchy airspace opacities in the right mid lower lung correspond to the irregular pulmonary nodules seen on most recent CT chest 11/07/2021, unchanged compared to prior exam. There was no new focal consolidation or pleural effusion. She reports having to take something to help her bowels move. She does complain of black stools. She continues to take Protonix and will now take it twice daily. I had done a lab evaluation of her anemia and she is clearly very iron deficient.Her B12 level was low normal. I recommended she take an iron supplement daily and B12. I also recommended she get on Senokot S twice daily. I think we need to restage her cancer and evaluate the status of her brain metastases. She denies fevers or chills. She denies pain. Her appetite is good. Her weight has been stable.  I have reviewed the past medical history, past surgical history, social history and family history with the patient and they are unchanged from previous note.  ALLERGIES:  is allergic to ergocalciferol.  MEDICATIONS:  Current Outpatient Medications  Medication Sig Dispense Refill   albuterol (VENTOLIN HFA) 108 (90 Base) MCG/ACT inhaler Inhale 2 puffs into the lungs every 4 (four) hours as needed.     ALPRAZolam (XANAX) 0.5 MG tablet Take 1 tablet (0.5 mg total) by mouth 3 (three) times daily as needed for anxiety. 90 tablet 0   atorvastatin (LIPITOR) 80 MG tablet Take 80 mg by mouth daily.     ATROVENT HFA 17 MCG/ACT inhaler Inhale into the lungs.     gabapentin (NEURONTIN) 100 MG capsule Take by mouth.     glipiZIDE (GLUCOTROL XL) 5 MG 24 hr tablet Take 5 mg by mouth daily.     HUMULIN R 100 UNIT/ML injection      JANUVIA 100 MG tablet Take 100 mg by mouth daily.     losartan (COZAAR) 25 MG tablet Take 25 mg by mouth daily.     metoprolol tartrate (LOPRESSOR) 25 MG tablet Take 25 mg by mouth 2 (two) times daily.     mupirocin cream (BACTROBAN) 2 % Apply topically.     ondansetron  (ZOFRAN) 4 MG tablet Take 1 tablet (4 mg total) by mouth every 4 (four) hours as needed for nausea. 90 tablet 3   Oxycodone HCl 10 MG TABS Take 1 tablet (10 mg total) by mouth every 6 (six) hours as needed (for severe pain). 60 tablet 0   pantoprazole (PROTONIX) 40 MG tablet Take 1 tablet (40 mg total) by mouth 2 (two) times daily. 60 tablet 5   prochlorperazine (COMPAZINE) 10 MG tablet Take 1 tablet (10 mg total) by mouth every 6 (six) hours as needed for nausea or vomiting. 90 tablet 3   rivaroxaban (XARELTO) 20 MG TABS tablet Take 20 mg by mouth daily with supper.     No current facility-administered medications for this visit.    HISTORY OF PRESENT ILLNESS:   Oncology History  Lung cancer, lower lobe ([place])  07/07/2020 Initial Diagnosis   Lung cancer,  lower lobe ([place])   10/15/2020 Cancer Staging   Staging form: Lung, AJCC 8th Edition - Clinical stage from 10/15/2020: Stage IV (cT1b, cN0, cM1) - Signed by Derwood Kaplan, MD on 03/31/2021 Histopathologic type: Squamous cell carcinoma, NOS Stage prefix: Initial diagnosis Histologic grade (G): G3 Histologic grading system: 4 grade system Laterality: Right Tumor size (mm): 18 Sites of metastasis: Brain Lymph-vascular invasion (LVI): Presence of LVI unknown/indeterminate Diagnostic confirmation: Positive histology Specimen type: Core Needle Biopsy Staged by: Managing physician Type of lung cancer: Locally advanced or metastatic non-small cell lung cancer Stage used in treatment planning: Yes National guidelines used in treatment planning: Yes Type of national guideline used in treatment planning: NCCN   04/08/2021 - 02/07/2022 Chemotherapy   Patient is on Treatment Plan : LUNG NSCLC flat dose Pembrolizumab Q21D     04/08/2021 -  Chemotherapy   Patient is on Treatment Plan : LUNG NSCLC Pembrolizumab (200) q21d         REVIEW OF SYSTEMS:   Constitutional: Denies fevers, chills or abnormal weight loss Eyes: Denies blurriness  of vision Ears, nose, mouth, throat, and face: Denies mucositis or sore throat Respiratory: Denies cough, dyspnea or wheezes Cardiovascular: Denies palpitation, chest discomfort or lower extremity swelling Gastrointestinal:  Denies nausea, heartburn or change in bowel habits Skin: Denies abnormal skin rashes Lymphatics: Denies new lymphadenopathy or easy bruising Neurological:Denies numbness, tingling or new weaknesses Behavioral/Psych: Mood is stable, no new changes  All other systems were reviewed with the patient and are negative.   VITALS:  Blood pressure (!) 140/63, pulse 60, temperature 97.7 F (36.5 C), temperature source Oral, resp. rate 20, height 5' 5.9" (1.674 m), weight 129 lb 11.2 oz (58.8 kg), SpO2 96 %.  Wt Readings from Last 3 Encounters:  03/17/22 126 lb 3.2 oz (57.2 kg)  03/07/22 129 lb 11.2 oz (58.8 kg)  02/24/22 127 lb 9.6 oz (57.9 kg)    Body mass index is 21 kg/m.  Performance status (ECOG): 1 - Symptomatic but completely ambulatory  PHYSICAL EXAM:   GENERAL:alert, no distress and comfortable SKIN: skin color, texture, turgor are normal, no rashes or significant lesions EYES: normal, Conjunctiva are pink and non-injected, sclera clear OROPHARYNX:no exudate, no erythema and lips, buccal mucosa, and tongue normal  NECK: supple, thyroid normal size, non-tender, without nodularity LYMPH:  no palpable lymphadenopathy in the cervical, axillary or inguinal LUNGS: clear to auscultation and percussion with normal breathing effort HEART: regular rate & rhythm and no murmurs and no lower extremity edema ABDOMEN:abdomen soft, non-tender and normal bowel sounds Musculoskeletal:no cyanosis of digits and no clubbing  NEURO: alert & oriented x 3 with fluent speech, no focal motor/sensory deficits. Negative romberg.  LABORATORY DATA:  I have reviewed the data as listed    Component Value Date/Time   NA 138 03/17/2022 0000   K 3.8 03/17/2022 0000   CL 106 03/17/2022  0000   CO2 23 (A) 03/17/2022 0000   GLUCOSE 83 10/20/2016 1618   BUN 17 03/17/2022 0000   CREATININE 0.9 03/17/2022 0000   CREATININE 1.07 (H) 10/20/2016 1618   CALCIUM 9.4 03/17/2022 0000   PROT 6.7 08/26/2010 0051   ALBUMIN 4.0 03/17/2022 0000   AST 20 03/17/2022 0000   ALT 17 03/17/2022 0000   ALKPHOS 113 03/17/2022 0000   BILITOT 0.5 08/26/2010 0051   GFRNONAA 51 (L) 10/20/2016 1618   GFRAA 59 (L) 10/20/2016 1618    No results found for: "SPEP", "UPEP"  Lab Results  Component Value  Date   WBC 11.2 03/17/2022   NEUTROABS 7.73 03/17/2022   HGB 9.5 (A) 03/17/2022   HCT 30 (A) 03/17/2022   MCV 80 (A) 03/17/2022   PLT 227 03/17/2022      Chemistry      Component Value Date/Time   NA 138 03/17/2022 0000   K 3.8 03/17/2022 0000   CL 106 03/17/2022 0000   CO2 23 (A) 03/17/2022 0000   BUN 17 03/17/2022 0000   CREATININE 0.9 03/17/2022 0000   CREATININE 1.07 (H) 10/20/2016 1618   GLU 76 03/17/2022 0000      Component Value Date/Time   CALCIUM 9.4 03/17/2022 0000   ALKPHOS 113 03/17/2022 0000   AST 20 03/17/2022 0000   ALT 17 03/17/2022 0000   BILITOT 0.5 08/26/2010 0051       RADIOGRAPHIC STUDIES: I have personally reviewed the radiological images as listed and agreed with the findings in the report. MR Brain W Wo Contrast  Result Date: 03/16/2022 CLINICAL DATA:  Lung cancer, metastatic to brain EXAM: MRI HEAD WITHOUT AND WITH CONTRAST TECHNIQUE: Multiplanar, multiecho pulse sequences of the brain and surrounding structures were obtained without and with intravenous contrast. CONTRAST:  11mL GADAVIST GADOBUTROL 1 MMOL/ML IV SOLN COMPARISON:  11/07/2021 FINDINGS: Brain: No new enhancing lesion is seen. Redemonstrated 4 mm faintly peripherally enhancing lesion in the right occipital lobe (series 16, image 77), unchanged. Punctate focus of enhancement in the left frontal operculum (series 16, image 88), unchanged. Unchanged small area of cortical enhancement in the  posterior right frontal lobe measuring up to 4 mm (series 16, image 113), unchanged. These lesions are associated with hemosiderin deposition. No restricted diffusion to suggest acute or subacute infarct. No acute hemorrhage, mass effect, or midline shift. No hydrocephalus or extra-axial collection. 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 Confluent T2 hyperintense signal in the periventricular white matter, likely the sequela of moderate to severe chronic small vessel ischemic disease. Vascular: Normal arterial flow voids. Normal arterial and venous enhancement. Skull and upper cervical spine: Normal marrow signal. Sinuses/Orbits: No acute finding.  Left lens replacement. Other: Trace fluid in left mastoid air cells. IMPRESSION: No new enhancing lesions. Unchanged appearance of previously noted right frontal, right occipital, and left frontal lesions. Electronically Signed   By: Merilyn Baba M.D.   On: 03/16/2022 16:31   EXAM:03/02/2022 PORTABLE CHEST VIEW 1 IMPRESSION: Patchy airspace opacities in the right mid lower lung correspond to the irregular pulmonary nodules seen on most recent CT chest 11/07/2021, unchanged compared to prior exam.   No new focal consolidation or pleural effusion.  EXAM:11/07/21 MRI HEAD WITHOUT AND WITH CONTRAST IMPRESSION: Findings consistent with treatment response with resolution of tight cerebella lesion described on prior MRI and decrease in size of right occipital and bilateral frontal lesions, as described above. No new enhancing lesion identified.  EXAM:11/07/21 CT CHEST WITHOUT CONTRAST IMPRESSION: Unchanged spiculated nodule of the superior segment right lower lobe measuring 1.9 x 1.6 cm. Multiple additional generally clustered, irregular nodules of the right lower lobe are likewise unchanged, measuring 0.7 cm and smaller. Background of extensive, fine centrilobular nodularity throughout,  concentrated in the lung apices, consistent with smoking-related respiratory bronchiolitis. Coronary artery disease Stable, benign fatty attenuation left adrenal adenoma. This demonstrates internal macroscopic fatty attenuation and has been stable greater than 5 years.        I,Gabriella Ballesteros,acting as a scribe for Derwood Kaplan, MD.,have documented all relevant documentation on  the behalf of Derwood Kaplan, MD,as directed by  Derwood Kaplan, MD while in the presence of Derwood Kaplan, MD.

## 2022-03-07 NOTE — Progress Notes (Signed)
Patient Care Team: Townsend Roger, MD as PCP - General (Internal Medicine) Sharmon Revere as Physician Assistant (Cardiology)  Clinic Day:  03/07/2022  Referring physician: Townsend Roger, MD  ASSESSMENT & PLAN:   Assessment & Plan: Lung cancer, lower lobe ([place]) Stage IV lung cancer diagnosed in April 2022.  She received 1 dose of chemotherapy in June 2022, with combination cisplatin/paclitaxel/pembrolizumab, and tolerated this very poorly.  She ended up in the hospital twice.  Since October 2022, she has been receiving palliative pembrolizumab and has tolerated this well.  She took a break and went to [place] for a time and so she missed some doses.  Also, while she was away, she fell and had a left hip fracture requiring surgery.  She was seen in May with a CT chest and MRI head prior to resuming pembrolizumab after her break.  Her disease appeared stable, so she has continued with pembrolizumab every 3 weeks. Today, she complains of weakness and fatigue. I will check iron studies as well as B12. She will proceed with cycle 12 next week, but is asking to have an extra week prior to cycle 13. So we will see her back in 4 weeks for repeat evaluation.    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  This presented with right sided head pain and pain of the right eye.  She received stereotactic radiation to the two brain lesions. MRI brain in September revealed four metastatic deposits in the brain. Several show mild associated hemorrhage. Mild edema. No mass-effect or midline shift.  She was placed on immunotherapy with pembrolizumab every 3 weeks in October.  MRI brain in January revealed a mixed response with one new enhancing lesion in the right cerebellar hemisphere measuring 3 mm. One lesion in the left frontal lobe has resolved, one lesion in the right occipital lobe is stable, and two lesions were mildly decreased in size.    MRI brain in May  revealed resolution of the right cerebellar lesion and decreased size of the right occipital and bilateral frontal lesions.       The patient understands the plans discussed today and is in agreement with them.  She knows to contact our office if she develops concerns prior to her next appointment.   Derwood Kaplan, MD  [place] [address] [place] [address] Dept: [phone] Dept Fax: [phone]   No orders of the defined types were placed in this encounter.     CHIEF COMPLAINT:  CC: A 76 year old female with history of lung cancer here for 3 week evaluation  Current Treatment:  Pembrolizumab  INTERVAL HISTORY:  [name] is here today for repeat clinical assessment. She denies fevers or chills. She denies pain. Her appetite is good. Her weight has been stable.  I have reviewed the past medical history, past surgical history, social history and family history with the patient and they are unchanged from previous note.  ALLERGIES:  is allergic to ergocalciferol.  MEDICATIONS:  Current Outpatient Medications  Medication Sig Dispense Refill   ALPRAZolam (XANAX) 0.5 MG tablet Take 1 tablet (0.5 mg total) by mouth 3 (three) times daily as needed for anxiety. 90 tablet 0   atorvastatin (LIPITOR) 80 MG tablet Take 80 mg by mouth daily.     dexamethasone (DECADRON) 4 MG tablet Take 4 mg by mouth daily as needed.     gabapentin (NEURONTIN)  100 MG capsule Take by mouth.     glipiZIDE (GLUCOTROL XL) 5 MG 24 hr tablet Take 5 mg by mouth daily.     HUMULIN R 100 UNIT/ML injection      JANUVIA 100 MG tablet Take 100 mg by mouth daily.     losartan (COZAAR) 25 MG tablet Take 25 mg by mouth daily.     metoprolol tartrate (LOPRESSOR) 25 MG tablet Take 25 mg by mouth 2 (two) times daily.     mupirocin cream (BACTROBAN) 2 % Apply topically.     ondansetron (ZOFRAN) 4 MG tablet Take 1 tablet (4 mg total) by mouth  every 4 (four) hours as needed for nausea. 90 tablet 3   Oxycodone HCl 10 MG TABS Take 1 tablet (10 mg total) by mouth every 6 (six) hours as needed (for severe pain). 60 tablet 0   pantoprazole (PROTONIX) 40 MG tablet Take 1 tablet (40 mg total) by mouth daily. 30 tablet 5   prochlorperazine (COMPAZINE) 10 MG tablet Take 1 tablet (10 mg total) by mouth every 6 (six) hours as needed for nausea or vomiting. 90 tablet 3   rivaroxaban (XARELTO) 20 MG TABS tablet Take 20 mg by mouth daily with supper.     No current facility-administered medications for this visit.    HISTORY OF PRESENT ILLNESS:   Oncology History  Lung cancer, lower lobe ([place])  07/07/2020 Initial Diagnosis   Lung cancer, lower lobe ([place])   10/15/2020 Cancer Staging   Staging form: Lung, AJCC 8th Edition - Clinical stage from 10/15/2020: Stage IV (cT1b, cN0, cM1) - Signed by Derwood Kaplan, MD on 03/31/2021 Histopathologic type: Squamous cell carcinoma, NOS Stage prefix: Initial diagnosis Histologic grade (G): G3 Histologic grading system: 4 grade system Laterality: Right Tumor size (mm): 18 Sites of metastasis: Brain Lymph-vascular invasion (LVI): Presence of LVI unknown/indeterminate Diagnostic confirmation: Positive histology Specimen type: Core Needle Biopsy Staged by: Managing physician Type of lung cancer: Locally advanced or metastatic non-small cell lung cancer Stage used in treatment planning: Yes National guidelines used in treatment planning: Yes Type of national guideline used in treatment planning: NCCN   04/08/2021 - 02/07/2022 Chemotherapy   Patient is on Treatment Plan : LUNG NSCLC flat dose Pembrolizumab Q21D     04/08/2021 -  Chemotherapy   Patient is on Treatment Plan : LUNG NSCLC Pembrolizumab (200) q21d         REVIEW OF SYSTEMS:   Constitutional: Denies fevers, chills or abnormal weight loss Eyes: Denies blurriness of vision Ears, nose, mouth, throat, and face: Denies mucositis or  sore throat Respiratory: Denies cough, dyspnea or wheezes Cardiovascular: Denies palpitation, chest discomfort or lower extremity swelling Gastrointestinal:  Denies nausea, heartburn or change in bowel habits Skin: Denies abnormal skin rashes Lymphatics: Denies new lymphadenopathy or easy bruising Neurological:Denies numbness, tingling or new weaknesses Behavioral/Psych: Mood is stable, no new changes  All other systems were reviewed with the patient and are negative.   VITALS:  There were no vitals taken for this visit.  Wt Readings from Last 3 Encounters:  02/24/22 127 lb 9.6 oz (57.9 kg)  02/07/22 126 lb (57.2 kg)  02/03/22 127 lb 6.4 oz (57.8 kg)    There is no height or weight on file to calculate BMI.  Performance status (ECOG): 1 - Symptomatic but completely ambulatory  PHYSICAL EXAM:   GENERAL:alert, no distress and comfortable SKIN: skin color, texture, turgor are normal, no rashes or significant lesions EYES: normal, Conjunctiva are pink  and non-injected, sclera clear OROPHARYNX:no exudate, no erythema and lips, buccal mucosa, and tongue normal  NECK: supple, thyroid normal size, non-tender, without nodularity LYMPH:  no palpable lymphadenopathy in the cervical, axillary or inguinal LUNGS: clear to auscultation and percussion with normal breathing effort HEART: regular rate & rhythm and no murmurs and no lower extremity edema ABDOMEN:abdomen soft, non-tender and normal bowel sounds Musculoskeletal:no cyanosis of digits and no clubbing  NEURO: alert & oriented x 3 with fluent speech, no focal motor/sensory deficits  LABORATORY DATA:  I have reviewed the data as listed    Component Value Date/Time   NA 139 02/24/2022 0000   K 4.3 02/24/2022 0000   CL 105 02/24/2022 0000   CO2 27 (A) 02/24/2022 0000   GLUCOSE 83 10/20/2016 1618   BUN 13 02/24/2022 0000   CREATININE 0.7 02/24/2022 0000   CREATININE 1.07 (H) 10/20/2016 1618   CALCIUM 9.3 02/24/2022 0000   PROT  6.7 08/26/2010 0051   ALBUMIN 4.2 02/24/2022 0000   AST 24 02/24/2022 0000   ALT 17 02/24/2022 0000   ALKPHOS 100 02/24/2022 0000   BILITOT 0.5 08/26/2010 0051   GFRNONAA 51 (L) 10/20/2016 1618   GFRAA 59 (L) 10/20/2016 1618    No results found for: "SPEP", "UPEP"  Lab Results  Component Value Date   WBC 8.4 02/24/2022   NEUTROABS 3.71 02/24/2022   HGB 10.8 (A) 02/24/2022   HCT 34 (A) 02/24/2022   MCV 86 01/13/2022   PLT 199 02/24/2022      Chemistry      Component Value Date/Time   NA 139 02/24/2022 0000   K 4.3 02/24/2022 0000   CL 105 02/24/2022 0000   CO2 27 (A) 02/24/2022 0000   BUN 13 02/24/2022 0000   CREATININE 0.7 02/24/2022 0000   CREATININE 1.07 (H) 10/20/2016 1618   GLU 112 02/24/2022 0000      Component Value Date/Time   CALCIUM 9.3 02/24/2022 0000   ALKPHOS 100 02/24/2022 0000   AST 24 02/24/2022 0000   ALT 17 02/24/2022 0000   BILITOT 0.5 08/26/2010 0051       RADIOGRAPHIC STUDIES: I have personally reviewed the radiological images as listed and agreed with the findings in the report. No results found.

## 2022-03-10 ENCOUNTER — Other Ambulatory Visit: Payer: Self-pay

## 2022-03-10 DIAGNOSIS — C3431 Malignant neoplasm of lower lobe, right bronchus or lung: Secondary | ICD-10-CM

## 2022-03-10 DIAGNOSIS — C7931 Secondary malignant neoplasm of brain: Secondary | ICD-10-CM

## 2022-03-10 MED ORDER — OXYCODONE HCL 10 MG PO TABS: 10.0000 mg | ORAL_TABLET | Freq: Four times a day (QID) | ORAL | 0 refills | Status: DC | PRN

## 2022-03-14 ENCOUNTER — Ambulatory Visit (HOSPITAL_COMMUNITY)
Admission: RE | Admit: 2022-03-14 | Discharge: 2022-03-14 | Disposition: A | Discharge status: Home / Self Care | Payer: Medicare Other | Source: Ambulatory Visit | Attending: Oncology | Admitting: Oncology

## 2022-03-14 DIAGNOSIS — C3431 Malignant neoplasm of lower lobe, right bronchus or lung: Secondary | ICD-10-CM | POA: Diagnosis not present

## 2022-03-14 DIAGNOSIS — G9389 Other specified disorders of brain: Secondary | ICD-10-CM | POA: Diagnosis not present

## 2022-03-14 MED ORDER — GADOBUTROL 1 MMOL/ML IV SOLN
6.0000 mL | Freq: Once | INTRAVENOUS | Status: AC | PRN
  Administered 2022-03-14: 6 mL via INTRAVENOUS

## 2022-03-15 ENCOUNTER — Encounter: Payer: Self-pay | Admitting: Oncology

## 2022-03-16 DIAGNOSIS — R911 Solitary pulmonary nodule: Secondary | ICD-10-CM | POA: Diagnosis not present

## 2022-03-16 DIAGNOSIS — Z85110 Personal history of malignant carcinoid tumor of bronchus and lung: Secondary | ICD-10-CM | POA: Diagnosis not present

## 2022-03-16 DIAGNOSIS — I700 Atherosclerosis of aorta: Secondary | ICD-10-CM | POA: Diagnosis not present

## 2022-03-16 DIAGNOSIS — R918 Other nonspecific abnormal finding of lung field: Secondary | ICD-10-CM | POA: Diagnosis not present

## 2022-03-16 DIAGNOSIS — C3431 Malignant neoplasm of lower lobe, right bronchus or lung: Secondary | ICD-10-CM | POA: Diagnosis not present

## 2022-03-17 ENCOUNTER — Inpatient Hospital Stay: Payer: Medicare Other

## 2022-03-17 ENCOUNTER — Encounter: Payer: Self-pay | Admitting: Hematology and Oncology

## 2022-03-17 ENCOUNTER — Inpatient Hospital Stay (INDEPENDENT_AMBULATORY_CARE_PROVIDER_SITE_OTHER): Payer: Medicare Other | Admitting: Hematology and Oncology

## 2022-03-17 DIAGNOSIS — C7931 Secondary malignant neoplasm of brain: Secondary | ICD-10-CM

## 2022-03-17 DIAGNOSIS — D72828 Other elevated white blood cell count: Secondary | ICD-10-CM | POA: Diagnosis not present

## 2022-03-17 DIAGNOSIS — C3431 Malignant neoplasm of lower lobe, right bronchus or lung: Secondary | ICD-10-CM | POA: Diagnosis not present

## 2022-03-17 DIAGNOSIS — D500 Iron deficiency anemia secondary to blood loss (chronic): Secondary | ICD-10-CM | POA: Diagnosis not present

## 2022-03-17 DIAGNOSIS — Z79899 Other long term (current) drug therapy: Secondary | ICD-10-CM | POA: Diagnosis not present

## 2022-03-17 DIAGNOSIS — D649 Anemia, unspecified: Secondary | ICD-10-CM | POA: Diagnosis not present

## 2022-03-17 DIAGNOSIS — D72829 Elevated white blood cell count, unspecified: Secondary | ICD-10-CM | POA: Insufficient documentation

## 2022-03-17 DIAGNOSIS — K2901 Acute gastritis with bleeding: Secondary | ICD-10-CM | POA: Diagnosis not present

## 2022-03-17 HISTORY — DX: Iron deficiency anemia secondary to blood loss (chronic): D50.0

## 2022-03-17 LAB — CBC
MCV: 80 — AB (ref 81–99)
RBC: 3.69 — AB (ref 3.87–5.11)

## 2022-03-17 LAB — BASIC METABOLIC PANEL
BUN: 17 (ref 4–21)
CO2: 23 — AB (ref 13–22)
Chloride: 106 (ref 99–108)
Creatinine: 0.9 (ref 0.5–1.1)
Glucose: 76
Potassium: 3.8 mEq/L (ref 3.5–5.1)
Sodium: 138 (ref 137–147)

## 2022-03-17 LAB — HEPATIC FUNCTION PANEL
ALT: 17 U/L (ref 7–35)
AST: 20 (ref 13–35)
Alkaline Phosphatase: 113 (ref 25–125)
Bilirubin, Total: 0.5

## 2022-03-17 LAB — CBC AND DIFFERENTIAL
HCT: 30 — AB (ref 36–46)
Hemoglobin: 9.5 — AB (ref 12.0–16.0)
Neutrophils Absolute: 7.73
Platelets: 227 10*3/uL (ref 150–400)
WBC: 11.2

## 2022-03-17 LAB — TSH: TSH: 0.979 u[IU]/mL (ref 0.350–4.500)

## 2022-03-17 LAB — COMPREHENSIVE METABOLIC PANEL
Albumin: 4 (ref 3.5–5.0)
Calcium: 9.4 (ref 8.7–10.7)

## 2022-03-17 MED ORDER — PANTOPRAZOLE SODIUM 40 MG PO TBEC: 40.0000 mg | DELAYED_RELEASE_TABLET | Freq: Two times a day (BID) | ORAL | 5 refills | Status: AC

## 2022-03-17 NOTE — Assessment & Plan Note (Addendum)
Stage IV lung cancer diagnosed in April 2022.  She received 1 dose of chemotherapy in June 2022, withcombination cisplatin/paclitaxel/pembrolizumab, and tolerated this very poorly.  She ended up in the hospitaltwice.  Since October 2022, she has been receiving palliative pembrolizumab and has tolerated this well.  She took a break and went to [place] for a time and so she missed some doses.  Also, while she was away, she fell and had a left hip fracture requiring surgery.  She was seen in May with a CT chest and MRI head prior to resuming pembrolizumab after her break.  Her disease appeared stable, so we continued with pembrolizumab every 3 weeks.   CT chest on September 21 revealed a slight increase in the right lower lobe nodule measuring 2.4 x 2.2 x 2 cm previously 1.9 x 1.6 cm.  Scattered pulmonary nodules, the largest measuring 7 mm, are stable.  As her disease is not growing rapidly, we would recommend to continue on palliative pembrolizumab, as she did not tolerate chemotherapy previously.  We will plan to repeat CT chest in 3 months.  She will proceed with a 13th cycle of pembrolizumab next week.  We will plan to see her back in 3 weeks for repeat clinical assessment prior to 14th cycle.

## 2022-03-17 NOTE — Progress Notes (Addendum)
[facility]  [address] [place],  [place]  [address] [phone]  Clinic Day:  03/17/2022  Referring physician: Townsend Roger, MD  ASSESSMENT & PLAN:   Assessment & Plan: Lung cancer, lower lobe ([place]) Stage IV lung cancer diagnosed in April 2022.  She received 1 dose of chemotherapy in June 2022, with combination cisplatin/paclitaxel/pembrolizumab, and tolerated this very poorly.  She ended up in the hospital twice.  Since October 2022, she has been receiving palliative pembrolizumab and has tolerated this well.  She took a break and went to [place] for a time and so she missed some doses.  Also, while she was away, she fell and had a left hip fracture requiring surgery.  She was seen in May with a CT chest and MRI head prior to resuming pembrolizumab after her break.  Her disease appeared stable, so we continued with pembrolizumab every 3 weeks.   CT chest on September 21 revealed a slight increase in the right lower lobe nodule measuring 2.4 x 2.2 x 2 cm previously 1.9 x 1.6 cm.  Scattered pulmonary nodules, the largest measuring 7 mm, are stable.  As her disease is not growing rapidly, we would recommend to continue on palliative pembrolizumab, as she did not tolerate chemotherapy previously.  We will plan to repeat CT chest in 3 months.  She will proceed with a 13th cycle of pembrolizumab next week.  We will plan to see her back in 3 weeks for repeat clinical assessment prior to 14th cycle.  Malignant neoplasm metastatic to brain [facility]) Brain metastases diagnosed in April 2022.  This presented with right sided head pain and pain of the right eye.  She received stereotactic radiation to the two brain lesions. MRI brain in September revealed four metastatic deposits in the brain. Several show mild associated hemorrhage. Mild edema. No mass-effect or midline shift.  She was placed on immunotherapy with pembrolizumab every 3 weeks in October.  MRI brain in  January revealed a mixed response with one new enhancing lesion in the right cerebellar hemisphere measuring 3 mm. One lesion in the left frontal lobe has resolved, one lesion in the right occipital lobe is stable, and two lesions were mildly decreased in size.    MRI brain in May revealed resolution of the right cerebellar lesion and decreased size of the right occipital and bilateral frontal lesions.  MRI brain on September 19 revealed stable previously noted right frontal, right occipital, and left frontal lesions, all measuring less than 4 mm. No new enhancing lesions were seen.  Iron deficiency anemia due to chronic blood loss The patient developed microcytic anemia and was found to be iron deficient, felt to be due to GI blood loss.  We advised her to increase her pantoprazole 40 mg to twice daily.  She has not started on oral iron.  Due to the worsening anemia and severity of the iron deficiency, I will plan to give her IV iron in the upcoming days.  She continues to report dark stool, but due to her multiple medical comorbidities, we will hold off on GI referral at this time.   Leukocytosis She has mild leukocytosis likely due to recent prednisone treatment.  She does not have any symptoms concerning for infection.   The patient understands the plans discussed today and is in agreement with them.  She knows to contact our office if she develops concerns prior to her next appointment.   I provided 30 minutes of  face-to-face time during this encounter and > 50% was spent counseling as documented under my assessment and plan.    [name], PA-C  [facility] AT [facility] [address] [place] [address] Dept: [phone] Dept Fax: [phone]   Orders Placed This Encounter  Procedures   CBC and differential    This external order was created through the Results Console.   CBC    This external order was created through the  Results Console.   Basic metabolic panel    This external order was created through the Results Console.   Comprehensive metabolic panel    This external order was created through the Results Console.   Hepatic function panel    This external order was created through the Results Console.   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  Current Treatment: Palliative pembrolizumab every 3 weeks  HISTORY OF PRESENT ILLNESS:   Oncology History  Lung cancer, lower lobe ([place])  07/07/2020 Initial Diagnosis   Lung cancer, lower lobe ([place])   10/15/2020 Cancer Staging   Staging form: Lung, AJCC 8th Edition - Clinical stage from 10/15/2020: Stage IV (cT1b, cN0, cM1) - Signed by Derwood Kaplan, MD on 03/31/2021 Histopathologic type: Squamous cell carcinoma, NOS Stage prefix: Initial diagnosis Histologic grade (G): G3 Histologic grading system: 4 grade system Laterality: Right Tumor size (mm): 18 Sites of metastasis: Brain Lymph-vascular invasion (LVI): Presence of LVI unknown/indeterminate Diagnostic confirmation: Positive histology Specimen type: Core Needle Biopsy Staged by: Managing physician Type of lung cancer: Locally advanced or metastatic non-small cell lung cancer Stage used in treatment planning: Yes National guidelines used in treatment planning: Yes Type of national guideline used in treatment planning: NCCN   04/08/2021 - 02/07/2022 Chemotherapy   Patient is on Treatment Plan : LUNG NSCLC flat dose Pembrolizumab Q21D     04/08/2021 -  Chemotherapy   Patient is on Treatment Plan : LUNG NSCLC Pembrolizumab (200) q21d         INTERVAL HISTORY:  Pualani is here today for repeat clinical assessment prior to 13th cycle of pembrolizumab and states she continues to tolerate this well except for fatigue and decreased appetite.  She is using Ensure to try to maintain her  weight..  She states she has not started an iron tablet even though Dr. Hinton Rao wrote down several options for her to try.  She states she is taking pantoprazole twice a day, so she will need a new prescription.  She states she is no longer taking any steroids.  She denies continued cough.  She denies worsening shortness of breath.  She reports persistent dark stool.  She denies pruritus or skin rash. She denies fevers or chills. She reports persistent back pain, for which she uses oxycodone as needed.  Her weight has decreased 3 pounds over last 3 weeks .  Prior to her visit today, she had a repeat CT chest to reassess her disease baseline.  She ambulates with a cane due to previous stroke.  REVIEW OF SYSTEMS:  Review of Systems  Constitutional:  Positive for appetite change, fatigue and unexpected weight change. Negative for chills and fever.  HENT:   Negative for lump/mass, mouth sores and sore throat.   Respiratory:  Negative for cough and shortness of breath.   Cardiovascular:  Negative for chest pain  and leg swelling.  Gastrointestinal:  Negative for abdominal pain, constipation, diarrhea, nausea and vomiting.  Endocrine: Negative for hot flashes.  Genitourinary:  Negative for difficulty urinating, dysuria, frequency and hematuria.   Musculoskeletal:  Positive for back pain and gait problem. Negative for arthralgias and myalgias.  Skin:  Negative for rash.  Neurological:  Positive for gait problem. Negative for dizziness and headaches.  Hematological:  Negative for adenopathy. Does not bruise/bleed easily.  Psychiatric/Behavioral:  Negative for depression and sleep disturbance. The patient is not nervous/anxious.      VITALS:  Blood pressure (!) 148/75, pulse 65, temperature 98.2 F (36.8 C), temperature source Oral, resp. rate 18, height 5' 5.9" (1.674 m), weight 126 lb 3.2 oz (57.2 kg), SpO2 97 %.  Wt Readings from Last 3 Encounters:  03/17/22 126 lb 3.2 oz (57.2 kg)  03/07/22 129 lb  11.2 oz (58.8 kg)  02/24/22 127 lb 9.6 oz (57.9 kg)    Body mass index is 20.43 kg/m.  Performance status (ECOG): 2 - Symptomatic, <50% confined to bed  PHYSICAL EXAM:  Physical Exam Vitals and nursing note reviewed.  Constitutional:      General: She is not in acute distress.    Appearance: Normal appearance.  HENT:     Head: Normocephalic and atraumatic.     Mouth/Throat:     Mouth: Mucous membranes are moist.     Pharynx: Oropharynx is clear. No oropharyngeal exudate or posterior oropharyngeal erythema.  Eyes:     General: No scleral icterus.    Extraocular Movements: Extraocular movements intact.     Conjunctiva/sclera: Conjunctivae normal.     Pupils: Pupils are equal, round, and reactive to light.  Cardiovascular:     Rate and Rhythm: Normal rate and regular rhythm.     Heart sounds: Normal heart sounds. No murmur heard.    No friction rub. No gallop.  Pulmonary:     Effort: Pulmonary effort is normal.     Breath sounds: Normal breath sounds. No wheezing, rhonchi or rales.  Abdominal:     General: There is no distension.     Palpations: Abdomen is soft. There is no hepatomegaly, splenomegaly or mass.     Tenderness: There is no abdominal tenderness.  Musculoskeletal:        General: Normal range of motion.     Cervical back: Normal range of motion and neck supple. No tenderness.     Right lower leg: No edema.     Left lower leg: No edema.  Lymphadenopathy:     Cervical: No cervical adenopathy.     Upper Body:     Right upper body: No supraclavicular or axillary adenopathy.     Left upper body: No supraclavicular or axillary adenopathy.     Lower Body: No right inguinal adenopathy. No left inguinal adenopathy.  Skin:    General: Skin is warm and dry.     Coloration: Skin is not jaundiced.     Findings: No rash.  Neurological:     Mental Status: She is alert and oriented to person, place, and time.     Cranial Nerves: No cranial nerve deficit.  Psychiatric:         Mood and Affect: Mood normal.        Behavior: Behavior normal.        Thought Content: Thought content normal.     LABS:      Latest Ref Rng & Units 03/17/2022   12:00 AM 02/24/2022  12:00 AM 02/03/2022   12:00 AM  CBC  WBC  11.2     8.4     8.4      Hemoglobin 12.0 - 16.0 9.5     10.8     11.8      Hematocrit 36 - 46 30     34     36      Platelets 150 - 400 K/uL 227     199     234         This result is from an external source.      Latest Ref Rng & Units 03/17/2022   12:00 AM 02/24/2022   12:00 AM 02/03/2022   12:00 AM  CMP  BUN 4 - 21 17     13     13       Creatinine 0.5 - 1.1 0.9     0.7     0.8      Sodium 137 - 147 138     139     140      Potassium 3.5 - 5.1 mEq/L 3.8     4.3     4.2      Chloride 99 - 108 106     105     106      CO2 13 - 22 23     27     26       Calcium 8.7 - 10.7 9.4     9.3     9.4      Alkaline Phos 25 - 125 113     100     103      AST 13 - 35 20     24     22       ALT 7 - 35 U/L 17     17     16          This result is from an external source.     Lab Results  Component Value Date   CEA1 1.2 02/04/2021   /  CEA  Date Value Ref Range Status  02/04/2021 1.2 0.0 - 4.7 ng/mL Final    Comment:    (NOTE)                             Nonsmokers          <3.9                             Smokers             <5.6 Roche [employer] Electrochemiluminescence Immunoassay (ECLIA) Values obtained with different assay methods or kits cannot be used interchangeably.  Results cannot be interpreted as absolute evidence of the presence or absence of malignant disease. Performed At: [facility] [place], [place] [id number] Rush Farmer MD KZ:[id number]    No results found for: "PSA1" No results found for: "CAN199" No results found for: "CAN125"  No results found for: "TOTALPROTELP", "ALBUMINELP", "A1GS", "A2GS", "BETS", "BETA2SER", "GAMS", "MSPIKE", "SPEI" Lab Results  Component Value Date   TIBC 419 02/24/2022    TIBC 364 02/10/2021   FERRITIN 9 (L) 02/24/2022   FERRITIN 22 02/10/2021   IRONPCTSAT 10 (L) 02/24/2022   IRONPCTSAT 17 02/10/2021   No results found for: "LDH"  STUDIES:  MR Brain W Wo Contrast  Result Date:  03/16/2022 CLINICAL DATA:  Lung cancer, metastatic to brain EXAM: MRI HEAD WITHOUT AND WITH CONTRAST TECHNIQUE: Multiplanar, multiecho pulse sequences of the brain and surrounding structures were obtained without and with intravenous contrast. CONTRAST:  53mL GADAVIST GADOBUTROL 1 MMOL/ML IV SOLN COMPARISON:  11/07/2021 FINDINGS: Brain: No new enhancing lesion is seen. Redemonstrated 4 mm faintly peripherally enhancing lesion in the right occipital lobe (series 16, image 77), unchanged. Punctate focus of enhancement in the left frontal operculum (series 16, image 88), unchanged. Unchanged small area of cortical enhancement in the posterior right frontal lobe measuring up to 4 mm (series 16, image 113), unchanged. These lesions are associated with hemosiderin deposition. No restricted diffusion to suggest acute or subacute infarct. No acute hemorrhage, mass effect, or midline shift. No hydrocephalus or extra-axial collection. 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 Confluent T2 hyperintense signal in the periventricular white matter, likely the sequela of moderate to severe chronic small vessel ischemic disease. Vascular: Normal arterial flow voids. Normal arterial and venous enhancement. Skull and upper cervical spine: Normal marrow signal. Sinuses/Orbits: No acute finding.  Left lens replacement. Other: Trace fluid in left mastoid air cells. IMPRESSION: No new enhancing lesions. Unchanged appearance of previously noted right frontal, right occipital, and left frontal lesions. Electronically Signed   By: Merilyn Baba M.D.   On: 03/16/2022 16:31       Exam(s): [id number] CT/CT CHEST W/ CM  CLINICAL DATA: 76 year old  female history of lung cancer. Follow-up  study. * Tracking Code: BO *  EXAM:  CT CHEST WITH CONTRAST  TECHNIQUE:  Multidetector CT imaging of the chest was performed during  intravenous contrast administration.   RADIATION DOSE REDUCTION: This exam was performed according to the  departmental dose-optimization program which includes automated  exposure control, adjustment of the mA and/or kV according to  patient size and/or use of iterative reconstruction technique.  CONTRAST: 60 mL of Isovue 370.   COMPARISON: Chest CT 11/07/2021.   FINDINGS:  Cardiovascular: Heart size is normal. There is no significant  pericardial fluid, thickening or pericardial calcification. There is  aortic atherosclerosis, as well as atherosclerosis of the great  vessels of the mediastinum and the coronary arteries, including  calcified atherosclerotic plaque in the left main, left anterior  descending, left circumflex and right coronary arteries. Status post  median sternotomy for CABG including LIMA to the LAD.  Mediastinum/Nodes: No pathologically enlarged mediastinal or hilar  lymph nodes. Esophagus is unremarkable in appearance. No axillary  lymphadenopathy.  Lungs/Pleura: Poorly defined spiculated nodule in the superior  segment of the right lower lobe (axial image 53 of series 301 and  coronal image 59 of series 601) measuring 2.4 x 2.2 x 2.0 cm. A few  other scattered small pulmonary nodules are noted, largest of which  is a nodule in the periphery of the right lower lobe (axial image 64  of series 301) measuring 7 mm, stable. No other new suspicious  appearing pulmonary nodules or masses are noted. No acute  consolidative airspace disease. No pleural effusions.  Upper Abdomen: 1.8 x 1.5 cm left adrenal nodule, stable compared to  the prior examination, previously characterized as a benign adenoma.  Status post cholecystectomy.  Musculoskeletal: Median sternotomy wires. There are no aggressive   appearing lytic or blastic lesions noted in the visualized portions  of the skeleton.   IMPRESSION:  1. Slight interval enlargement of aggressive appearing nodule in  the superior segment of the  right lower lobe which remains highly  concerning for primary bronchogenic neoplasm. Other smaller  pulmonary nodules are stable.  2. Aortic atherosclerosis, in addition to left main and three-vessel  coronary artery disease. Status post median sternotomy for CABG  including LIMA to the LAD.   Aortic Atherosclerosis (ICD10-I70.0).     HISTORY:   Past Medical History:  Diagnosis Date   Arthritis    Asthma    CAD (coronary artery disease) 09/09/2010   S/p multiple PCIs to RCA, LAD, LCx // s/p inf STEMI 4/18 >> POBA to mRCA followed by CABG (L-LAD, S-D1, S-OM, S-PDA) // Intraoperative TEE 4/18: EF 50-55, no RWMA   Constipation 04/18/2021   Diabetes mellitus    Diabetic nephropathy (HCC)    stage II   Diabetic neuropathy (HCC)    GERD (gastroesophageal reflux disease)    History of MI (myocardial infarction) 05/2002   Hyperlipidemia 09/09/2010       Hypertension    Iron deficiency anemia due to chronic blood loss 03/17/2022   Lung cancer ([place])    with brain metastases   Persistent atrial fibrillation ([place]) 10/01/2016   Post op AFib after CABG 4/18 // Amiodarone and Coumadin started // Amiodarone stopped 11/2016   Shoulder pain, right 04/18/2021   Stroke ([place])    Weight loss 05/18/2021    Past Surgical History:  Procedure Laterality Date   CHOLECYSTECTOMY     CORONARY ARTERY BYPASS GRAFT N/A 10/06/2016   Procedure: CORONARY ARTERY BYPASS GRAFTING (CABG) x four, using internal mammary, and bilateral saphenous veins harvested endoscopically;  Surgeon: Melrose Nakayama, MD;  Location: [facility];  Service: Open Heart Surgery;  Laterality: N/A;   CORONARY BALLOON ANGIOPLASTY N/A 10/01/2016   Procedure: Coronary Balloon Angioplasty;  Surgeon: Belva Crome, MD;  Location: [facility];  Service: Cardiovascular;  Laterality: N/A;   LEFT HEART CATH AND CORONARY ANGIOGRAPHY N/A 10/01/2016   Procedure: Left Heart Cath and Coronary Angiography;  Surgeon: Belva Crome, MD;  Location: [facility];  Service: Cardiovascular;  Laterality: N/A;   TEE WITHOUT CARDIOVERSION N/A 10/06/2016   Procedure: TRANSESOPHAGEAL ECHOCARDIOGRAM (TEE);  Surgeon: Melrose Nakayama, MD;  Location: [facility];  Service: Open Heart Surgery;  Laterality: N/A;    Family History  Problem Relation Age of Onset   Tuberculosis Mother    Cancer Father 37       unknown type   Leukemia Sister    Tuberculosis Sister    Cancer Sister        liver   Cancer Brother 34   Cancer Brother    Heart attack Brother 45   Cancer Brother     Social History:  reports that she has been smoking cigarettes. She has a 60.00 pack-year smoking history. She has never used smokeless tobacco. She reports that she does not drink alcohol and does not use drugs.The patient is accompanied by her daughter today.  Allergies:  Allergies  Allergen Reactions   Ergocalciferol Nausea And Vomiting    Current Medications: Current Outpatient Medications  Medication Sig Dispense Refill   albuterol (VENTOLIN HFA) 108 (90 Base) MCG/ACT inhaler Inhale 2 puffs into the lungs every 4 (four) hours as needed.     ALPRAZolam (XANAX) 0.5 MG tablet Take 1 tablet (0.5 mg total) by mouth 3 (three) times daily as needed for anxiety. 90 tablet 0   atorvastatin (LIPITOR) 80 MG tablet Take 80 mg by mouth daily.     ATROVENT HFA 17  MCG/ACT inhaler Inhale into the lungs.     gabapentin (NEURONTIN) 100 MG capsule Take by mouth.     glipiZIDE (GLUCOTROL XL) 5 MG 24 hr tablet Take 5 mg by mouth daily.     HUMULIN R 100 UNIT/ML injection      JANUVIA 100 MG tablet Take 100 mg by mouth daily.     losartan (COZAAR) 25 MG tablet Take 25 mg by mouth daily.     metoprolol tartrate (LOPRESSOR) 25 MG tablet Take 25 mg by mouth 2 (two) times daily.      mupirocin cream (BACTROBAN) 2 % Apply topically.     ondansetron (ZOFRAN) 4 MG tablet Take 1 tablet (4 mg total) by mouth every 4 (four) hours as needed for nausea. 90 tablet 3   Oxycodone HCl 10 MG TABS Take 1 tablet (10 mg total) by mouth every 6 (six) hours as needed (for severe pain). 60 tablet 0   pantoprazole (PROTONIX) 40 MG tablet Take 1 tablet (40 mg total) by mouth 2 (two) times daily. 60 tablet 5   prochlorperazine (COMPAZINE) 10 MG tablet Take 1 tablet (10 mg total) by mouth every 6 (six) hours as needed for nausea or vomiting. 90 tablet 3   rivaroxaban (XARELTO) 20 MG TABS tablet Take 20 mg by mouth daily with supper.     No current facility-administered medications for this visit.

## 2022-03-17 NOTE — Assessment & Plan Note (Addendum)
Brain metastases diagnosed in April 2022. This presented with right sided head pain and pain of the right eye. She received stereotactic radiation to the two brain lesions. MRI brain in September revealed four metastatic deposits in the brain. Several show mild associated hemorrhage. Mild edema. No mass-effect or midline shift.  She was placed on immunotherapy with pembrolizumab every 3 weeks in October.  MRI brain in January revealed a mixed response with one new enhancing lesion in the right cerebellar hemisphere measuring 3 mm. One lesion in the left frontal lobe has resolved, one lesion in the right occipital lobe is stable, and two lesions were mildly decreased in size.    MRI brain in May revealed resolution of the right cerebellar lesion and decreased size of the right occipital and bilateral frontal lesions.  MRI brain on September 19 revealed stable previously noted right frontal, right occipital, and left frontal lesions, all measuring less than 4 mm. No new enhancing lesions were seen.

## 2022-03-17 NOTE — Assessment & Plan Note (Addendum)
The patient developed microcytic anemia and was found to be iron deficient, felt to be due to GI blood loss.  We advised her to increase her pantoprazole 40 mg to twice daily.  She has not started on oral iron.  Due to the worsening anemia and severity of the iron deficiency, I will plan to give her IV iron in the upcoming days.  She continues to report dark stool, but due to her multiple medical comorbidities, we will hold off on GI referral at this time.

## 2022-03-17 NOTE — Assessment & Plan Note (Signed)
She has mild leukocytosis likely due to recent prednisone treatment.  She does not have any symptoms concerning for infection.

## 2022-03-19 ENCOUNTER — Encounter: Payer: Self-pay | Admitting: Oncology

## 2022-03-19 LAB — T4: T4, Total: 8 ug/dL (ref 4.5–12.0)

## 2022-03-20 ENCOUNTER — Encounter: Payer: Self-pay | Admitting: Hematology and Oncology

## 2022-03-21 ENCOUNTER — Other Ambulatory Visit: Payer: Self-pay | Admitting: Pharmacist

## 2022-03-21 ENCOUNTER — Other Ambulatory Visit: Payer: Self-pay

## 2022-03-21 DIAGNOSIS — Z7401 Bed confinement status: Secondary | ICD-10-CM | POA: Diagnosis not present

## 2022-03-21 DIAGNOSIS — Z79891 Long term (current) use of opiate analgesic: Secondary | ICD-10-CM | POA: Diagnosis not present

## 2022-03-21 DIAGNOSIS — Z043 Encounter for examination and observation following other accident: Secondary | ICD-10-CM | POA: Diagnosis not present

## 2022-03-21 DIAGNOSIS — R911 Solitary pulmonary nodule: Secondary | ICD-10-CM | POA: Diagnosis not present

## 2022-03-21 DIAGNOSIS — Z743 Need for continuous supervision: Secondary | ICD-10-CM | POA: Diagnosis not present

## 2022-03-21 DIAGNOSIS — I130 Hypertensive heart and chronic kidney disease with heart failure and stage 1 through stage 4 chronic kidney disease, or unspecified chronic kidney disease: Secondary | ICD-10-CM | POA: Diagnosis not present

## 2022-03-21 DIAGNOSIS — E871 Hypo-osmolality and hyponatremia: Secondary | ICD-10-CM | POA: Diagnosis not present

## 2022-03-21 DIAGNOSIS — Z79899 Other long term (current) drug therapy: Secondary | ICD-10-CM | POA: Diagnosis not present

## 2022-03-21 DIAGNOSIS — Z96641 Presence of right artificial hip joint: Secondary | ICD-10-CM | POA: Diagnosis not present

## 2022-03-21 DIAGNOSIS — M1611 Unilateral primary osteoarthritis, right hip: Secondary | ICD-10-CM | POA: Diagnosis not present

## 2022-03-21 DIAGNOSIS — Z951 Presence of aortocoronary bypass graft: Secondary | ICD-10-CM | POA: Diagnosis not present

## 2022-03-21 DIAGNOSIS — W19XXXA Unspecified fall, initial encounter: Secondary | ICD-10-CM | POA: Diagnosis not present

## 2022-03-21 DIAGNOSIS — I2510 Atherosclerotic heart disease of native coronary artery without angina pectoris: Secondary | ICD-10-CM | POA: Diagnosis not present

## 2022-03-21 DIAGNOSIS — I4891 Unspecified atrial fibrillation: Secondary | ICD-10-CM | POA: Diagnosis not present

## 2022-03-21 DIAGNOSIS — M1990 Unspecified osteoarthritis, unspecified site: Secondary | ICD-10-CM | POA: Diagnosis not present

## 2022-03-21 DIAGNOSIS — R2689 Other abnormalities of gait and mobility: Secondary | ICD-10-CM | POA: Diagnosis not present

## 2022-03-21 DIAGNOSIS — C3490 Malignant neoplasm of unspecified part of unspecified bronchus or lung: Secondary | ICD-10-CM | POA: Diagnosis not present

## 2022-03-21 DIAGNOSIS — I252 Old myocardial infarction: Secondary | ICD-10-CM | POA: Diagnosis not present

## 2022-03-21 DIAGNOSIS — R6889 Other general symptoms and signs: Secondary | ICD-10-CM | POA: Diagnosis not present

## 2022-03-21 DIAGNOSIS — Z471 Aftercare following joint replacement surgery: Secondary | ICD-10-CM | POA: Diagnosis not present

## 2022-03-21 DIAGNOSIS — I5032 Chronic diastolic (congestive) heart failure: Secondary | ICD-10-CM | POA: Diagnosis not present

## 2022-03-21 DIAGNOSIS — S72001D Fracture of unspecified part of neck of right femur, subsequent encounter for closed fracture with routine healing: Secondary | ICD-10-CM | POA: Diagnosis not present

## 2022-03-21 DIAGNOSIS — Z66 Do not resuscitate: Secondary | ICD-10-CM | POA: Diagnosis not present

## 2022-03-21 DIAGNOSIS — J449 Chronic obstructive pulmonary disease, unspecified: Secondary | ICD-10-CM | POA: Diagnosis not present

## 2022-03-21 DIAGNOSIS — Z8673 Personal history of transient ischemic attack (TIA), and cerebral infarction without residual deficits: Secondary | ICD-10-CM | POA: Diagnosis not present

## 2022-03-21 DIAGNOSIS — I361 Nonrheumatic tricuspid (valve) insufficiency: Secondary | ICD-10-CM | POA: Diagnosis not present

## 2022-03-21 DIAGNOSIS — M6281 Muscle weakness (generalized): Secondary | ICD-10-CM | POA: Diagnosis not present

## 2022-03-21 DIAGNOSIS — E1122 Type 2 diabetes mellitus with diabetic chronic kidney disease: Secondary | ICD-10-CM | POA: Diagnosis not present

## 2022-03-21 DIAGNOSIS — D649 Anemia, unspecified: Secondary | ICD-10-CM | POA: Diagnosis not present

## 2022-03-21 DIAGNOSIS — I10 Essential (primary) hypertension: Secondary | ICD-10-CM | POA: Diagnosis not present

## 2022-03-21 DIAGNOSIS — M84651A Pathological fracture in other disease, right femur, initial encounter for fracture: Secondary | ICD-10-CM | POA: Diagnosis not present

## 2022-03-21 DIAGNOSIS — Z7984 Long term (current) use of oral hypoglycemic drugs: Secondary | ICD-10-CM | POA: Diagnosis not present

## 2022-03-21 DIAGNOSIS — R0902 Hypoxemia: Secondary | ICD-10-CM | POA: Diagnosis not present

## 2022-03-21 DIAGNOSIS — C3431 Malignant neoplasm of lower lobe, right bronchus or lung: Secondary | ICD-10-CM

## 2022-03-21 DIAGNOSIS — F17210 Nicotine dependence, cigarettes, uncomplicated: Secondary | ICD-10-CM | POA: Diagnosis not present

## 2022-03-21 DIAGNOSIS — C7931 Secondary malignant neoplasm of brain: Secondary | ICD-10-CM | POA: Diagnosis not present

## 2022-03-21 DIAGNOSIS — N182 Chronic kidney disease, stage 2 (mild): Secondary | ICD-10-CM | POA: Diagnosis not present

## 2022-03-21 DIAGNOSIS — S72011A Unspecified intracapsular fracture of right femur, initial encounter for closed fracture: Secondary | ICD-10-CM | POA: Diagnosis not present

## 2022-03-21 DIAGNOSIS — Z7901 Long term (current) use of anticoagulants: Secondary | ICD-10-CM | POA: Diagnosis not present

## 2022-03-21 DIAGNOSIS — S72001A Fracture of unspecified part of neck of right femur, initial encounter for closed fracture: Secondary | ICD-10-CM | POA: Diagnosis not present

## 2022-03-21 DIAGNOSIS — I69351 Hemiplegia and hemiparesis following cerebral infarction affecting right dominant side: Secondary | ICD-10-CM | POA: Diagnosis not present

## 2022-03-21 DIAGNOSIS — M25551 Pain in right hip: Secondary | ICD-10-CM | POA: Diagnosis not present

## 2022-03-21 DIAGNOSIS — I110 Hypertensive heart disease with heart failure: Secondary | ICD-10-CM | POA: Diagnosis not present

## 2022-03-22 ENCOUNTER — Encounter: Payer: Self-pay | Admitting: Hematology and Oncology

## 2022-03-22 DIAGNOSIS — I2510 Atherosclerotic heart disease of native coronary artery without angina pectoris: Secondary | ICD-10-CM | POA: Diagnosis not present

## 2022-03-22 DIAGNOSIS — S72001A Fracture of unspecified part of neck of right femur, initial encounter for closed fracture: Secondary | ICD-10-CM | POA: Diagnosis not present

## 2022-03-22 DIAGNOSIS — Z471 Aftercare following joint replacement surgery: Secondary | ICD-10-CM | POA: Diagnosis not present

## 2022-03-22 DIAGNOSIS — I110 Hypertensive heart disease with heart failure: Secondary | ICD-10-CM | POA: Diagnosis not present

## 2022-03-22 DIAGNOSIS — Z96641 Presence of right artificial hip joint: Secondary | ICD-10-CM | POA: Diagnosis not present

## 2022-03-22 DIAGNOSIS — M1611 Unilateral primary osteoarthritis, right hip: Secondary | ICD-10-CM | POA: Diagnosis not present

## 2022-03-22 DIAGNOSIS — M84651A Pathological fracture in other disease, right femur, initial encounter for fracture: Secondary | ICD-10-CM | POA: Diagnosis not present

## 2022-03-22 DIAGNOSIS — W19XXXA Unspecified fall, initial encounter: Secondary | ICD-10-CM | POA: Diagnosis not present

## 2022-03-22 DIAGNOSIS — S72011A Unspecified intracapsular fracture of right femur, initial encounter for closed fracture: Secondary | ICD-10-CM | POA: Diagnosis not present

## 2022-03-23 ENCOUNTER — Encounter: Payer: Self-pay | Admitting: Oncology

## 2022-03-23 ENCOUNTER — Encounter: Payer: Self-pay | Admitting: Hematology and Oncology

## 2022-03-23 ENCOUNTER — Ambulatory Visit: Payer: Medicare Other

## 2022-03-24 DIAGNOSIS — R0902 Hypoxemia: Secondary | ICD-10-CM | POA: Diagnosis not present

## 2022-03-25 DIAGNOSIS — I4891 Unspecified atrial fibrillation: Secondary | ICD-10-CM | POA: Diagnosis not present

## 2022-03-27 ENCOUNTER — Encounter: Payer: Self-pay | Admitting: Oncology

## 2022-03-27 ENCOUNTER — Encounter: Payer: Self-pay | Admitting: Hematology and Oncology

## 2022-03-28 DIAGNOSIS — S72011A Unspecified intracapsular fracture of right femur, initial encounter for closed fracture: Secondary | ICD-10-CM | POA: Diagnosis not present

## 2022-03-28 DIAGNOSIS — Z7401 Bed confinement status: Secondary | ICD-10-CM | POA: Diagnosis not present

## 2022-03-28 DIAGNOSIS — R262 Difficulty in walking, not elsewhere classified: Secondary | ICD-10-CM | POA: Diagnosis not present

## 2022-03-28 DIAGNOSIS — Z7901 Long term (current) use of anticoagulants: Secondary | ICD-10-CM | POA: Diagnosis not present

## 2022-03-28 DIAGNOSIS — G8918 Other acute postprocedural pain: Secondary | ICD-10-CM | POA: Diagnosis not present

## 2022-03-28 DIAGNOSIS — Z951 Presence of aortocoronary bypass graft: Secondary | ICD-10-CM | POA: Diagnosis not present

## 2022-03-28 DIAGNOSIS — E119 Type 2 diabetes mellitus without complications: Secondary | ICD-10-CM | POA: Diagnosis not present

## 2022-03-28 DIAGNOSIS — S72001D Fracture of unspecified part of neck of right femur, subsequent encounter for closed fracture with routine healing: Secondary | ICD-10-CM | POA: Diagnosis not present

## 2022-03-28 DIAGNOSIS — M6281 Muscle weakness (generalized): Secondary | ICD-10-CM | POA: Diagnosis not present

## 2022-03-28 DIAGNOSIS — C7931 Secondary malignant neoplasm of brain: Secondary | ICD-10-CM | POA: Diagnosis not present

## 2022-03-28 DIAGNOSIS — R6889 Other general symptoms and signs: Secondary | ICD-10-CM | POA: Diagnosis not present

## 2022-03-28 DIAGNOSIS — R2689 Other abnormalities of gait and mobility: Secondary | ICD-10-CM | POA: Diagnosis not present

## 2022-03-28 DIAGNOSIS — I2510 Atherosclerotic heart disease of native coronary artery without angina pectoris: Secondary | ICD-10-CM | POA: Diagnosis not present

## 2022-03-28 DIAGNOSIS — I69351 Hemiplegia and hemiparesis following cerebral infarction affecting right dominant side: Secondary | ICD-10-CM | POA: Diagnosis not present

## 2022-03-28 DIAGNOSIS — C3490 Malignant neoplasm of unspecified part of unspecified bronchus or lung: Secondary | ICD-10-CM | POA: Diagnosis not present

## 2022-03-28 DIAGNOSIS — Z743 Need for continuous supervision: Secondary | ICD-10-CM | POA: Diagnosis not present

## 2022-03-28 DIAGNOSIS — I4891 Unspecified atrial fibrillation: Secondary | ICD-10-CM | POA: Diagnosis not present

## 2022-03-28 DIAGNOSIS — D62 Acute posthemorrhagic anemia: Secondary | ICD-10-CM | POA: Diagnosis not present

## 2022-03-28 DIAGNOSIS — Z8673 Personal history of transient ischemic attack (TIA), and cerebral infarction without residual deficits: Secondary | ICD-10-CM | POA: Diagnosis not present

## 2022-03-28 DIAGNOSIS — D649 Anemia, unspecified: Secondary | ICD-10-CM | POA: Diagnosis not present

## 2022-03-28 DIAGNOSIS — I130 Hypertensive heart and chronic kidney disease with heart failure and stage 1 through stage 4 chronic kidney disease, or unspecified chronic kidney disease: Secondary | ICD-10-CM | POA: Diagnosis not present

## 2022-03-30 ENCOUNTER — Ambulatory Visit: Payer: Medicare Other

## 2022-03-30 DIAGNOSIS — D62 Acute posthemorrhagic anemia: Secondary | ICD-10-CM | POA: Diagnosis not present

## 2022-03-30 DIAGNOSIS — G8918 Other acute postprocedural pain: Secondary | ICD-10-CM | POA: Diagnosis not present

## 2022-03-30 DIAGNOSIS — R262 Difficulty in walking, not elsewhere classified: Secondary | ICD-10-CM | POA: Diagnosis not present

## 2022-03-30 DIAGNOSIS — S72001D Fracture of unspecified part of neck of right femur, subsequent encounter for closed fracture with routine healing: Secondary | ICD-10-CM | POA: Diagnosis not present

## 2022-03-31 DIAGNOSIS — E119 Type 2 diabetes mellitus without complications: Secondary | ICD-10-CM | POA: Diagnosis not present

## 2022-04-04 ENCOUNTER — Other Ambulatory Visit: Payer: Self-pay | Admitting: *Deleted

## 2022-04-04 NOTE — Patient Outreach (Signed)
Ms. Bouchillon resides in [facility]. Screening for potential [facility] care coordination services as benefit of insurance plan and PCP.   Spoke with Janett Billow, SNF SW to make aware writer is following for potential [facility] needs.   Will continue to follow.   Marthenia Rolling, MSN, RN,BSN [place] Acute Care Coordinator [phone] (Direct dial)

## 2022-04-05 ENCOUNTER — Other Ambulatory Visit: Payer: Medicare Other

## 2022-04-05 ENCOUNTER — Ambulatory Visit: Payer: Medicare Other | Admitting: Hematology and Oncology

## 2022-04-07 ENCOUNTER — Ambulatory Visit: Payer: Medicare Other

## 2022-04-11 ENCOUNTER — Other Ambulatory Visit: Payer: Self-pay | Admitting: *Deleted

## 2022-04-11 NOTE — Patient Outreach (Signed)
Joice Coordinator follow up. Screening for potential [facility] care coordination services as benefit of insurance plan and PCP. Verified in [facility] Mrs. Naas discharged from [facility] on 04/07/22.  Confirmed with Mariann Laster SN[facility] SW Mrs. Casciano discharged home on 04/07/22 per request. Will have [facility]. Janett Billow indicates Mrs. Essman daughter Hatley [facility]) is primary contact. Telephone call made to Iva. Patient identifiers confirmed. Vickie confirms Alvis Lemmings is coming out today. States she is looking into personal care services for Mrs. Wynetta Emery. Mrs. Chrobak lives alone with daughter's assist.  Discussed [facility] follow up. Vickie states she does not think [facility] services are needed at this time. Pleasantly declined. Expressed appreciation of the call.   Marthenia Rolling, MSN, RN,BSN [place] Acute Care Coordinator [phone] (Direct dial)

## 2022-04-13 DIAGNOSIS — C7931 Secondary malignant neoplasm of brain: Secondary | ICD-10-CM | POA: Diagnosis not present

## 2022-04-13 DIAGNOSIS — R262 Difficulty in walking, not elsewhere classified: Secondary | ICD-10-CM | POA: Diagnosis not present

## 2022-04-13 DIAGNOSIS — E1140 Type 2 diabetes mellitus with diabetic neuropathy, unspecified: Secondary | ICD-10-CM | POA: Diagnosis not present

## 2022-04-13 DIAGNOSIS — M80051D Age-related osteoporosis with current pathological fracture, right femur, subsequent encounter for fracture with routine healing: Secondary | ICD-10-CM | POA: Diagnosis not present

## 2022-04-13 DIAGNOSIS — I69351 Hemiplegia and hemiparesis following cerebral infarction affecting right dominant side: Secondary | ICD-10-CM | POA: Diagnosis not present

## 2022-04-13 DIAGNOSIS — I130 Hypertensive heart and chronic kidney disease with heart failure and stage 1 through stage 4 chronic kidney disease, or unspecified chronic kidney disease: Secondary | ICD-10-CM | POA: Diagnosis not present

## 2022-04-13 DIAGNOSIS — I252 Old myocardial infarction: Secondary | ICD-10-CM | POA: Diagnosis not present

## 2022-04-13 DIAGNOSIS — Z96641 Presence of right artificial hip joint: Secondary | ICD-10-CM | POA: Diagnosis not present

## 2022-04-13 DIAGNOSIS — Z9181 History of falling: Secondary | ICD-10-CM | POA: Diagnosis not present

## 2022-04-13 DIAGNOSIS — I509 Heart failure, unspecified: Secondary | ICD-10-CM | POA: Diagnosis not present

## 2022-04-13 DIAGNOSIS — Z7984 Long term (current) use of oral hypoglycemic drugs: Secondary | ICD-10-CM | POA: Diagnosis not present

## 2022-04-13 DIAGNOSIS — E1122 Type 2 diabetes mellitus with diabetic chronic kidney disease: Secondary | ICD-10-CM | POA: Diagnosis not present

## 2022-04-13 DIAGNOSIS — D630 Anemia in neoplastic disease: Secondary | ICD-10-CM | POA: Diagnosis not present

## 2022-04-13 DIAGNOSIS — I2510 Atherosclerotic heart disease of native coronary artery without angina pectoris: Secondary | ICD-10-CM | POA: Diagnosis not present

## 2022-04-13 DIAGNOSIS — H9190 Unspecified hearing loss, unspecified ear: Secondary | ICD-10-CM | POA: Diagnosis not present

## 2022-04-13 DIAGNOSIS — C3490 Malignant neoplasm of unspecified part of unspecified bronchus or lung: Secondary | ICD-10-CM | POA: Diagnosis not present

## 2022-04-13 DIAGNOSIS — Z7901 Long term (current) use of anticoagulants: Secondary | ICD-10-CM | POA: Diagnosis not present

## 2022-04-13 DIAGNOSIS — N182 Chronic kidney disease, stage 2 (mild): Secondary | ICD-10-CM | POA: Diagnosis not present

## 2022-04-13 DIAGNOSIS — I4891 Unspecified atrial fibrillation: Secondary | ICD-10-CM | POA: Diagnosis not present

## 2022-04-13 DIAGNOSIS — D631 Anemia in chronic kidney disease: Secondary | ICD-10-CM | POA: Diagnosis not present

## 2022-04-13 DIAGNOSIS — Z951 Presence of aortocoronary bypass graft: Secondary | ICD-10-CM | POA: Diagnosis not present

## 2022-04-13 DIAGNOSIS — F17210 Nicotine dependence, cigarettes, uncomplicated: Secondary | ICD-10-CM | POA: Diagnosis not present

## 2022-04-13 DIAGNOSIS — E785 Hyperlipidemia, unspecified: Secondary | ICD-10-CM | POA: Diagnosis not present

## 2022-04-14 ENCOUNTER — Other Ambulatory Visit: Payer: Self-pay

## 2022-04-14 DIAGNOSIS — S72142D Displaced intertrochanteric fracture of left femur, subsequent encounter for closed fracture with routine healing: Secondary | ICD-10-CM | POA: Diagnosis not present

## 2022-04-18 ENCOUNTER — Other Ambulatory Visit: Payer: Self-pay

## 2022-04-18 DIAGNOSIS — E785 Hyperlipidemia, unspecified: Secondary | ICD-10-CM | POA: Diagnosis not present

## 2022-04-18 DIAGNOSIS — I2510 Atherosclerotic heart disease of native coronary artery without angina pectoris: Secondary | ICD-10-CM | POA: Diagnosis not present

## 2022-04-18 DIAGNOSIS — M80051D Age-related osteoporosis with current pathological fracture, right femur, subsequent encounter for fracture with routine healing: Secondary | ICD-10-CM | POA: Diagnosis not present

## 2022-04-18 DIAGNOSIS — R059 Cough, unspecified: Secondary | ICD-10-CM | POA: Diagnosis not present

## 2022-04-18 DIAGNOSIS — I509 Heart failure, unspecified: Secondary | ICD-10-CM | POA: Diagnosis not present

## 2022-04-18 DIAGNOSIS — D630 Anemia in neoplastic disease: Secondary | ICD-10-CM | POA: Diagnosis not present

## 2022-04-18 DIAGNOSIS — I69351 Hemiplegia and hemiparesis following cerebral infarction affecting right dominant side: Secondary | ICD-10-CM | POA: Diagnosis not present

## 2022-04-18 DIAGNOSIS — I4891 Unspecified atrial fibrillation: Secondary | ICD-10-CM | POA: Diagnosis not present

## 2022-04-18 DIAGNOSIS — E1122 Type 2 diabetes mellitus with diabetic chronic kidney disease: Secondary | ICD-10-CM | POA: Diagnosis not present

## 2022-04-18 DIAGNOSIS — C3490 Malignant neoplasm of unspecified part of unspecified bronchus or lung: Secondary | ICD-10-CM | POA: Diagnosis not present

## 2022-04-18 DIAGNOSIS — D631 Anemia in chronic kidney disease: Secondary | ICD-10-CM | POA: Diagnosis not present

## 2022-04-18 DIAGNOSIS — F17210 Nicotine dependence, cigarettes, uncomplicated: Secondary | ICD-10-CM | POA: Diagnosis not present

## 2022-04-18 DIAGNOSIS — I130 Hypertensive heart and chronic kidney disease with heart failure and stage 1 through stage 4 chronic kidney disease, or unspecified chronic kidney disease: Secondary | ICD-10-CM | POA: Diagnosis not present

## 2022-04-18 DIAGNOSIS — Z951 Presence of aortocoronary bypass graft: Secondary | ICD-10-CM | POA: Diagnosis not present

## 2022-04-18 DIAGNOSIS — E1140 Type 2 diabetes mellitus with diabetic neuropathy, unspecified: Secondary | ICD-10-CM | POA: Diagnosis not present

## 2022-04-18 DIAGNOSIS — C7931 Secondary malignant neoplasm of brain: Secondary | ICD-10-CM | POA: Diagnosis not present

## 2022-04-18 DIAGNOSIS — I252 Old myocardial infarction: Secondary | ICD-10-CM | POA: Diagnosis not present

## 2022-04-18 DIAGNOSIS — Z96641 Presence of right artificial hip joint: Secondary | ICD-10-CM | POA: Diagnosis not present

## 2022-04-18 DIAGNOSIS — N182 Chronic kidney disease, stage 2 (mild): Secondary | ICD-10-CM | POA: Diagnosis not present

## 2022-04-18 DIAGNOSIS — Z7984 Long term (current) use of oral hypoglycemic drugs: Secondary | ICD-10-CM | POA: Diagnosis not present

## 2022-04-18 DIAGNOSIS — Z9181 History of falling: Secondary | ICD-10-CM | POA: Diagnosis not present

## 2022-04-18 DIAGNOSIS — Z7901 Long term (current) use of anticoagulants: Secondary | ICD-10-CM | POA: Diagnosis not present

## 2022-04-18 DIAGNOSIS — H9190 Unspecified hearing loss, unspecified ear: Secondary | ICD-10-CM | POA: Diagnosis not present

## 2022-04-19 ENCOUNTER — Other Ambulatory Visit: Payer: Medicare Other

## 2022-04-19 ENCOUNTER — Ambulatory Visit: Payer: Medicare Other | Admitting: Hematology and Oncology

## 2022-04-20 DIAGNOSIS — I2510 Atherosclerotic heart disease of native coronary artery without angina pectoris: Secondary | ICD-10-CM | POA: Diagnosis not present

## 2022-04-20 DIAGNOSIS — C3490 Malignant neoplasm of unspecified part of unspecified bronchus or lung: Secondary | ICD-10-CM | POA: Diagnosis not present

## 2022-04-20 DIAGNOSIS — C7931 Secondary malignant neoplasm of brain: Secondary | ICD-10-CM | POA: Diagnosis not present

## 2022-04-20 DIAGNOSIS — Z951 Presence of aortocoronary bypass graft: Secondary | ICD-10-CM | POA: Diagnosis not present

## 2022-04-20 DIAGNOSIS — I4891 Unspecified atrial fibrillation: Secondary | ICD-10-CM | POA: Diagnosis not present

## 2022-04-20 DIAGNOSIS — I252 Old myocardial infarction: Secondary | ICD-10-CM | POA: Diagnosis not present

## 2022-04-20 DIAGNOSIS — N182 Chronic kidney disease, stage 2 (mild): Secondary | ICD-10-CM | POA: Diagnosis not present

## 2022-04-20 DIAGNOSIS — Z7984 Long term (current) use of oral hypoglycemic drugs: Secondary | ICD-10-CM | POA: Diagnosis not present

## 2022-04-20 DIAGNOSIS — I509 Heart failure, unspecified: Secondary | ICD-10-CM | POA: Diagnosis not present

## 2022-04-20 DIAGNOSIS — F17210 Nicotine dependence, cigarettes, uncomplicated: Secondary | ICD-10-CM | POA: Diagnosis not present

## 2022-04-20 DIAGNOSIS — E1122 Type 2 diabetes mellitus with diabetic chronic kidney disease: Secondary | ICD-10-CM | POA: Diagnosis not present

## 2022-04-20 DIAGNOSIS — Z96641 Presence of right artificial hip joint: Secondary | ICD-10-CM | POA: Diagnosis not present

## 2022-04-20 DIAGNOSIS — E1140 Type 2 diabetes mellitus with diabetic neuropathy, unspecified: Secondary | ICD-10-CM | POA: Diagnosis not present

## 2022-04-20 DIAGNOSIS — D630 Anemia in neoplastic disease: Secondary | ICD-10-CM | POA: Diagnosis not present

## 2022-04-20 DIAGNOSIS — H9190 Unspecified hearing loss, unspecified ear: Secondary | ICD-10-CM | POA: Diagnosis not present

## 2022-04-20 DIAGNOSIS — D631 Anemia in chronic kidney disease: Secondary | ICD-10-CM | POA: Diagnosis not present

## 2022-04-20 DIAGNOSIS — I69351 Hemiplegia and hemiparesis following cerebral infarction affecting right dominant side: Secondary | ICD-10-CM | POA: Diagnosis not present

## 2022-04-20 DIAGNOSIS — E785 Hyperlipidemia, unspecified: Secondary | ICD-10-CM | POA: Diagnosis not present

## 2022-04-20 DIAGNOSIS — M80051D Age-related osteoporosis with current pathological fracture, right femur, subsequent encounter for fracture with routine healing: Secondary | ICD-10-CM | POA: Diagnosis not present

## 2022-04-20 DIAGNOSIS — I130 Hypertensive heart and chronic kidney disease with heart failure and stage 1 through stage 4 chronic kidney disease, or unspecified chronic kidney disease: Secondary | ICD-10-CM | POA: Diagnosis not present

## 2022-04-20 DIAGNOSIS — Z7901 Long term (current) use of anticoagulants: Secondary | ICD-10-CM | POA: Diagnosis not present

## 2022-04-20 DIAGNOSIS — Z9181 History of falling: Secondary | ICD-10-CM | POA: Diagnosis not present

## 2022-04-21 ENCOUNTER — Other Ambulatory Visit: Payer: Self-pay

## 2022-04-21 ENCOUNTER — Other Ambulatory Visit: Payer: Self-pay | Admitting: Hematology and Oncology

## 2022-04-21 DIAGNOSIS — C3431 Malignant neoplasm of lower lobe, right bronchus or lung: Secondary | ICD-10-CM

## 2022-04-24 ENCOUNTER — Encounter: Payer: Self-pay | Admitting: Hematology and Oncology

## 2022-04-24 ENCOUNTER — Inpatient Hospital Stay (INDEPENDENT_AMBULATORY_CARE_PROVIDER_SITE_OTHER): Payer: Medicare Other | Admitting: Hematology and Oncology

## 2022-04-24 ENCOUNTER — Inpatient Hospital Stay: Payer: Medicare Other | Attending: Oncology

## 2022-04-24 DIAGNOSIS — D631 Anemia in chronic kidney disease: Secondary | ICD-10-CM | POA: Diagnosis not present

## 2022-04-24 DIAGNOSIS — D500 Iron deficiency anemia secondary to blood loss (chronic): Secondary | ICD-10-CM | POA: Diagnosis not present

## 2022-04-24 DIAGNOSIS — E1140 Type 2 diabetes mellitus with diabetic neuropathy, unspecified: Secondary | ICD-10-CM | POA: Diagnosis not present

## 2022-04-24 DIAGNOSIS — I130 Hypertensive heart and chronic kidney disease with heart failure and stage 1 through stage 4 chronic kidney disease, or unspecified chronic kidney disease: Secondary | ICD-10-CM | POA: Diagnosis not present

## 2022-04-24 DIAGNOSIS — Z806 Family history of leukemia: Secondary | ICD-10-CM

## 2022-04-24 DIAGNOSIS — D630 Anemia in neoplastic disease: Secondary | ICD-10-CM | POA: Diagnosis not present

## 2022-04-24 DIAGNOSIS — Z800 Family history of malignant neoplasm of digestive organs: Secondary | ICD-10-CM | POA: Diagnosis not present

## 2022-04-24 DIAGNOSIS — M80051D Age-related osteoporosis with current pathological fracture, right femur, subsequent encounter for fracture with routine healing: Secondary | ICD-10-CM | POA: Diagnosis not present

## 2022-04-24 DIAGNOSIS — I2510 Atherosclerotic heart disease of native coronary artery without angina pectoris: Secondary | ICD-10-CM | POA: Diagnosis not present

## 2022-04-24 DIAGNOSIS — I509 Heart failure, unspecified: Secondary | ICD-10-CM | POA: Diagnosis not present

## 2022-04-24 DIAGNOSIS — Z7984 Long term (current) use of oral hypoglycemic drugs: Secondary | ICD-10-CM | POA: Diagnosis not present

## 2022-04-24 DIAGNOSIS — Z96641 Presence of right artificial hip joint: Secondary | ICD-10-CM | POA: Diagnosis not present

## 2022-04-24 DIAGNOSIS — I4891 Unspecified atrial fibrillation: Secondary | ICD-10-CM | POA: Diagnosis not present

## 2022-04-24 DIAGNOSIS — C3431 Malignant neoplasm of lower lobe, right bronchus or lung: Secondary | ICD-10-CM | POA: Diagnosis not present

## 2022-04-24 DIAGNOSIS — C7931 Secondary malignant neoplasm of brain: Secondary | ICD-10-CM | POA: Diagnosis not present

## 2022-04-24 DIAGNOSIS — E1122 Type 2 diabetes mellitus with diabetic chronic kidney disease: Secondary | ICD-10-CM | POA: Diagnosis not present

## 2022-04-24 DIAGNOSIS — F17210 Nicotine dependence, cigarettes, uncomplicated: Secondary | ICD-10-CM | POA: Diagnosis not present

## 2022-04-24 DIAGNOSIS — H9190 Unspecified hearing loss, unspecified ear: Secondary | ICD-10-CM | POA: Diagnosis not present

## 2022-04-24 DIAGNOSIS — Z9181 History of falling: Secondary | ICD-10-CM | POA: Diagnosis not present

## 2022-04-24 DIAGNOSIS — Z951 Presence of aortocoronary bypass graft: Secondary | ICD-10-CM | POA: Diagnosis not present

## 2022-04-24 DIAGNOSIS — D649 Anemia, unspecified: Secondary | ICD-10-CM | POA: Diagnosis not present

## 2022-04-24 DIAGNOSIS — C3490 Malignant neoplasm of unspecified part of unspecified bronchus or lung: Secondary | ICD-10-CM | POA: Diagnosis not present

## 2022-04-24 DIAGNOSIS — D72829 Elevated white blood cell count, unspecified: Secondary | ICD-10-CM

## 2022-04-24 DIAGNOSIS — Z7901 Long term (current) use of anticoagulants: Secondary | ICD-10-CM | POA: Diagnosis not present

## 2022-04-24 DIAGNOSIS — R300 Dysuria: Secondary | ICD-10-CM | POA: Diagnosis not present

## 2022-04-24 DIAGNOSIS — E785 Hyperlipidemia, unspecified: Secondary | ICD-10-CM | POA: Diagnosis not present

## 2022-04-24 DIAGNOSIS — I10 Essential (primary) hypertension: Secondary | ICD-10-CM | POA: Diagnosis not present

## 2022-04-24 DIAGNOSIS — I252 Old myocardial infarction: Secondary | ICD-10-CM | POA: Diagnosis not present

## 2022-04-24 DIAGNOSIS — N182 Chronic kidney disease, stage 2 (mild): Secondary | ICD-10-CM | POA: Diagnosis not present

## 2022-04-24 DIAGNOSIS — I69351 Hemiplegia and hemiparesis following cerebral infarction affecting right dominant side: Secondary | ICD-10-CM | POA: Diagnosis not present

## 2022-04-24 LAB — BASIC METABOLIC PANEL
BUN: 22 — AB (ref 4–21)
CO2: 28 — AB (ref 13–22)
Chloride: 104 (ref 99–108)
Creatinine: 0.8 (ref 0.5–1.1)
Glucose: 268
Potassium: 4 mEq/L (ref 3.5–5.1)
Sodium: 137 (ref 137–147)

## 2022-04-24 LAB — HEPATIC FUNCTION PANEL
ALT: 19 U/L (ref 7–35)
AST: 28 (ref 13–35)
Alkaline Phosphatase: 102 (ref 25–125)
Bilirubin, Total: 0.7

## 2022-04-24 LAB — COMPREHENSIVE METABOLIC PANEL
Albumin: 3.9 (ref 3.5–5.0)
Calcium: 9.1 (ref 8.7–10.7)

## 2022-04-24 LAB — CBC AND DIFFERENTIAL
HCT: 31 — AB (ref 36–46)
Hemoglobin: 9.3 — AB (ref 12.0–16.0)
MCV: 80 — AB (ref 81–99)
Neutrophils Absolute: 8.17
Platelets: 362 10*3/uL (ref 150–400)
WBC: 13.4

## 2022-04-24 LAB — CBC: RBC: 3.8 — AB (ref 3.87–5.11)

## 2022-04-24 NOTE — Assessment & Plan Note (Addendum)
The patient developed microcytic anemia and was found to be iron deficient, felt to be due to GI blood loss.  We advised her to increase her pantoprazole 40 mg to twice daily.  At her visit in September, she had not started oral iron, so I recommended she receive IV iron in the form of Feraheme..  She continued to report dark stool, but due to her multiple medical comorbidities, we decided to hold off on GI referral.  Due to her fall and right hip fracture, she has not received IV iron yet.  We will proceed with Feraheme at this time as well.

## 2022-04-24 NOTE — Progress Notes (Signed)
[facility]  [address] [place],  [place]  [address] [phone]  Clinic Day:  04/24/2022  Referring physician: Townsend Roger, MD  ASSESSMENT & PLAN:   Assessment & Plan: Lung cancer, lower lobe ([place]) Stage IV lung cancer diagnosed in April 2022.  She received 1 dose of chemotherapy in June 2022, with combination cisplatin/paclitaxel/pembrolizumab, and tolerated this very poorly.  She ended up in the hospital twice.  Since October 2022, she has been receiving palliative pembrolizumab and has tolerated this well.  She took a break and went to [place] for a time and so she missed some doses.  Also, while she was away, she fell and had a left hip fracture requiring surgery.  She was seen in May with a CT chest and MRI head prior to resuming pembrolizumab after her break.  Her disease appeared stable, so we continued with pembrolizumab every 3 weeks.   CT chest on September 21st revealed a slight increase in the right lower lobe nodule measuring 2.4 x 2.2 x 2 cm previously 1.9 x 1.6 cm.  Scattered pulmonary nodules, the largest measuring 7 mm, are stable.  As her disease was not growing rapidly, we recommended continuing  palliative pembrolizumab, as she did not tolerate chemotherapy previously.  She then fell and sustained a right hip fracture requiring surgery prior to her 13th cycle.  We will resume pembrolizumab and proceed with a 13th cycle of pembrolizumab next available.  We will plan to see her back in 3 weeks for repeat clinical assessment prior to 14th cycle.  Malignant neoplasm metastatic to brain [facility]) Brain metastases diagnosed in April 2022.  This presented with right sided head pain and pain of the right eye.  She received stereotactic radiation to the two brain lesions. MRI brain in September revealed four metastatic deposits in the brain. Several show mild associated hemorrhage. Mild edema. No mass-effect or midline shift.  She was placed on  immunotherapy with pembrolizumab every 3 weeks in October.  MRI brain in January revealed a mixed response with one new enhancing lesion in the right cerebellar hemisphere measuring 3 mm. One lesion in the left frontal lobe has resolved, one lesion in the right occipital lobe is stable, and two lesions were mildly decreased in size.    MRI brain in May revealed resolution of the right cerebellar lesion and decreased size of the right occipital and bilateral frontal lesions.  MRI brain on September 19 revealed stable previously noted right frontal, right occipital, and left frontal lesions, all measuring less than 4 mm. No new enhancing lesions were seen.  Iron deficiency anemia due to chronic blood loss The patient developed microcytic anemia and was found to be iron deficient, felt to be due to GI blood loss.  We advised her to increase her pantoprazole 40 mg to twice daily.  At her visit in September, she had not started oral iron, so I recommended she receive IV iron in the form of Feraheme..  She continued to report dark stool, but due to her multiple medical comorbidities, we decided to hold off on GI referral.  Due to her fall and right hip fracture, she has not received IV iron yet.  We will proceed with Feraheme at this time as well.  Leukocytosis She had mild leukocytosis in September felt to be due to prednisone treatment.  She has recurrent leukocytosis.  Her surgical wound looks good. She does not have any symptoms concerning for infection.  I  will obtain a urinalysis and urine culture.   The patient and her daughter understand the plans discussed today and are in agreement with them.  They knows to contact our office if she develops concerns prior to her next appointment.    [name], PA-C  [facility] AT [facility] [address] [place] [address] Dept: [phone] Dept Fax: [phone]   Orders Placed This Encounter   Procedures   Miscellaneous test (send-out)    Standing Status:   Future    Standing Expiration Date:   04/25/2023    Order Specific Question:   Test name / description:    Answer:   Urinalysis and urine culture at [facility]   CBC and differential    This external order was created through the Results Console.   CBC    This external order was created through the Results Console.   Basic metabolic panel    This external order was created through the Results Console.   Comprehensive metabolic panel    This external order was created through the Results Console.   Hepatic function panel    This external order was created through the Results Console.      CHIEF COMPLAINT:  CC: Stage IV squamous cell lung cancer  Current Treatment: Palliative pembrolizumab every 3 weeks  HISTORY OF PRESENT ILLNESS:   Oncology History  Lung cancer, lower lobe ([place])  07/07/2020 Initial Diagnosis   Lung cancer, lower lobe ([place])   10/15/2020 Cancer Staging   Staging form: Lung, AJCC 8th Edition - Clinical stage from 10/15/2020: Stage IV (cT1b, cN0, cM1) - Signed by Derwood Kaplan, MD on 03/31/2021 Histopathologic type: Squamous cell carcinoma, NOS Stage prefix: Initial diagnosis Histologic grade (G): G3 Histologic grading system: 4 grade system Laterality: Right Tumor size (mm): 18 Sites of metastasis: Brain Lymph-vascular invasion (LVI): Presence of LVI unknown/indeterminate Diagnostic confirmation: Positive histology Specimen type: Core Needle Biopsy Staged by: Managing physician Type of lung cancer: Locally advanced or metastatic non-small cell lung cancer Stage used in treatment planning: Yes National guidelines used in treatment planning: Yes Type of national guideline used in treatment planning: NCCN   04/08/2021 - 02/07/2022 Chemotherapy   Patient is on Treatment Plan : LUNG NSCLC flat dose Pembrolizumab Q21D     04/08/2021 -  Chemotherapy   Patient is on Treatment Plan : LUNG NSCLC  Pembrolizumab (200) q21d         INTERVAL HISTORY:  [name] is here today for repeat clinical assessment prior to resuming pembrolizumab after her right hip fracture and surgical repair.  She felt prior to her 13th cycle of pembrolizumab, so she has not had any treatment since August.  She was also to get IV Feraheme due to iron deficiency.  She was transfused in the hospital her hemoglobin was down to 7.2.  She is anxious to get back on treatment.  She denies fevers, chills or other signs of infection. She denies pain. Her appetite is good. Her weight has decreased 4 pounds over last 5 weeks .  REVIEW OF SYSTEMS:  Review of Systems  Constitutional:  Negative for appetite change, chills, fatigue, fever and unexpected weight change.  HENT:   Negative for lump/mass, mouth sores and sore throat.   Respiratory:  Negative for cough and shortness of breath.   Cardiovascular:  Negative for chest pain and leg swelling.  Gastrointestinal:  Negative for abdominal pain, constipation, diarrhea, nausea and vomiting.  Endocrine: Negative for  hot flashes.  Genitourinary:  Negative for difficulty urinating, dysuria, frequency and hematuria.   Musculoskeletal:  Negative for arthralgias, back pain and myalgias.  Skin:  Negative for rash.  Neurological:  Negative for dizziness and headaches.  Hematological:  Negative for adenopathy. Does not bruise/bleed easily.  Psychiatric/Behavioral:  Negative for depression and sleep disturbance. The patient is not nervous/anxious.      VITALS:  Blood pressure 136/65, pulse 64, temperature 98 F (36.7 C), temperature source Oral, resp. rate 18, height 5' 5.9" (1.674 m), weight 122 lb 14.4 oz (55.7 kg), SpO2 95 %.  Wt Readings from Last 3 Encounters:  04/24/22 122 lb 14.4 oz (55.7 kg)  03/17/22 126 lb 3.2 oz (57.2 kg)  03/07/22 129 lb 11.2 oz (58.8 kg)    Body mass index is 19.9 kg/m.  Performance status (ECOG): 2 - Symptomatic, <50% confined to bed  PHYSICAL  EXAM:  Physical Exam Vitals and nursing note reviewed.  Constitutional:      General: She is not in acute distress.    Appearance: Normal appearance.  HENT:     Head: Normocephalic and atraumatic.     Mouth/Throat:     Mouth: Mucous membranes are moist.     Pharynx: Oropharynx is clear. No oropharyngeal exudate or posterior oropharyngeal erythema.  Eyes:     General: No scleral icterus.    Extraocular Movements: Extraocular movements intact.     Conjunctiva/sclera: Conjunctivae normal.     Pupils: Pupils are equal, round, and reactive to light.  Cardiovascular:     Rate and Rhythm: Normal rate and regular rhythm.     Heart sounds: Normal heart sounds. No murmur heard.    No friction rub. No gallop.  Pulmonary:     Effort: Pulmonary effort is normal.     Breath sounds: Normal breath sounds. No wheezing, rhonchi or rales.  Abdominal:     General: There is no distension.     Palpations: Abdomen is soft. There is no hepatomegaly, splenomegaly or mass.     Tenderness: There is no abdominal tenderness.  Musculoskeletal:        General: Normal range of motion.     Cervical back: Normal range of motion and neck supple. No tenderness.     Right lower leg: No edema.     Left lower leg: No edema.  Lymphadenopathy:     Cervical: No cervical adenopathy.     Upper Body:     Right upper body: No supraclavicular or axillary adenopathy.     Left upper body: No supraclavicular or axillary adenopathy.  Skin:    General: Skin is warm and dry.     Coloration: Skin is not jaundiced.     Findings: No rash.  Neurological:     Mental Status: She is alert and oriented to person, place, and time.     Cranial Nerves: No cranial nerve deficit.  Psychiatric:        Mood and Affect: Mood normal.        Behavior: Behavior normal.        Thought Content: Thought content normal.     LABS:      Latest Ref Rng & Units 04/24/2022   12:00 AM 03/17/2022   12:00 AM 02/24/2022   12:00 AM  CBC  WBC   13.4     11.2     8.4      Hemoglobin 12.0 - 16.0 9.3     9.5     10.8  Hematocrit 36 - 46 31     30     34      Platelets 150 - 400 K/uL 362     227     199         This result is from an external source.      Latest Ref Rng & Units 04/24/2022   12:00 AM 03/17/2022   12:00 AM 02/24/2022   12:00 AM  CMP  BUN 4 - 21 22     17     13       Creatinine 0.5 - 1.1 0.8     0.9     0.7      Sodium 137 - 147 137     138     139      Potassium 3.5 - 5.1 mEq/L 4.0     3.8     4.3      Chloride 99 - 108 104     106     105      CO2 13 - 22 28     23     27       Calcium 8.7 - 10.7 9.1     9.4     9.3      Alkaline Phos 25 - 125 102     113     100      AST 13 - 35 28     20     24       ALT 7 - 35 U/L 19     17     17          This result is from an external source.     Lab Results  Component Value Date   CEA1 1.2 02/04/2021   /  CEA  Date Value Ref Range Status  02/04/2021 1.2 0.0 - 4.7 ng/mL Final    Comment:    (NOTE)                             Nonsmokers          <3.9                             Smokers             <5.6 [employer] Electrochemiluminescence Immunoassay (ECLIA) Values obtained with different assay methods or kits cannot be used interchangeably.  Results cannot be interpreted as absolute evidence of the presence or absence of malignant disease. Performed At: [facility] [place], [place] [id number] Rush Farmer MD HH:[id number]    No results found for: "PSA1" No results found for: "CAN199" No results found for: "CAN125"  No results found for: "TOTALPROTELP", "ALBUMINELP", "A1GS", "A2GS", "BETS", "BETA2SER", "GAMS", "MSPIKE", "SPEI" Lab Results  Component Value Date   TIBC 419 02/24/2022   TIBC 364 02/10/2021   FERRITIN 9 (L) 02/24/2022   FERRITIN 22 02/10/2021   IRONPCTSAT 10 (L) 02/24/2022   IRONPCTSAT 17 02/10/2021   No results found for: "LDH"  STUDIES:  No results found.    HISTORY:   Past Medical History:   Diagnosis Date   Arthritis    Asthma    CAD (coronary artery disease) 09/09/2010   S/p multiple PCIs to RCA, LAD, LCx // s/p inf STEMI 4/18 >> POBA to mRCA followed by CABG (L-LAD, S-D1, S-OM, S-PDA) //  Intraoperative TEE 4/18: EF 50-55, no RWMA   Constipation 04/18/2021   Diabetes mellitus    Diabetic nephropathy (HCC)    stage II   Diabetic neuropathy (H[facility])    GERD (gastroesophageal reflux disease)    History of MI (myocardial infarction) 05/2002   Hyperlipidemia 09/09/2010       Hypertension    Iron deficiency anemia due to chronic blood loss 03/17/2022   Lung cancer [facility])    with brain metastases   Persistent atrial fibrillation ([place]) 10/01/2016   Post op AFib after CABG 4/18 // Amiodarone and Coumadin started // Amiodarone stopped 11/2016   Shoulder pain, right 04/18/2021   Stroke ([place])    Weight loss 05/18/2021    Past Surgical History:  Procedure Laterality Date   CHOLECYSTECTOMY     CORONARY ARTERY BYPASS GRAFT N/A 10/06/2016   Procedure: CORONARY ARTERY BYPASS GRAFTING (CABG) x four, using internal mammary, and bilateral saphenous veins harvested endoscopically;  Surgeon: Melrose Nakayama, MD;  Location: [place];  Service: Open Heart Surgery;  Laterality: N/A;   CORONARY BALLOON ANGIOPLASTY N/A 10/01/2016   Procedure: Coronary Balloon Angioplasty;  Surgeon: Belva Crome, MD;  Location: [place] CV LAB;  Service: Cardiovascular;  Laterality: N/A;   LEFT HEART CATH AND CORONARY ANGIOGRAPHY N/A 10/01/2016   Procedure: Left Heart Cath and Coronary Angiography;  Surgeon: Belva Crome, MD;  Location: [place] CV LAB;  Service: Cardiovascular;  Laterality: N/A;   TEE WITHOUT CARDIOVERSION N/A 10/06/2016   Procedure: TRANSESOPHAGEAL ECHOCARDIOGRAM (TEE);  Surgeon: Melrose Nakayama, MD;  Location: [place];  Service: Open Heart Surgery;  Laterality: N/A;    Family History  Problem Relation Age of Onset   Tuberculosis Mother    Cancer Father 29       unknown type    Leukemia Sister    Tuberculosis Sister    Cancer Sister        liver   Cancer Brother 72   Cancer Brother    Heart attack Brother 59   Cancer Brother     Social History:  reports that she has been smoking cigarettes. She has a 60.00 pack-year smoking history. She has never used smokeless tobacco. She reports that she does not drink alcohol and does not use drugs.The patient is accompanied by her daughter today.  Allergies:  Allergies  Allergen Reactions   Ergocalciferol Nausea And Vomiting    Current Medications: Current Outpatient Medications  Medication Sig Dispense Refill   albuterol (VENTOLIN HFA) 108 (90 Base) MCG/ACT inhaler Inhale 2 puffs into the lungs every 4 (four) hours as needed.     ALPRAZolam (XANAX) 0.5 MG tablet Take 1 tablet (0.5 mg total) by mouth 3 (three) times daily as needed for anxiety. 90 tablet 0   atorvastatin (LIPITOR) 80 MG tablet Take 80 mg by mouth daily.     ATROVENT HFA 17 MCG/ACT inhaler Inhale into the lungs.     benzonatate (TESSALON) 100 MG capsule Take 100 mg by mouth 3 (three) times daily.     gabapentin (NEURONTIN) 100 MG capsule Take by mouth.     glipiZIDE (GLUCOTROL XL) 5 MG 24 hr tablet Take 5 mg by mouth daily.     HUMULIN R 100 UNIT/ML injection      JANUVIA 100 MG tablet Take 100 mg by mouth daily.     losartan (COZAAR) 25 MG tablet Take 25 mg by mouth daily.     metoprolol tartrate (LOPRESSOR) 25 MG tablet Take 25  mg by mouth 2 (two) times daily.     mupirocin cream (BACTROBAN) 2 % Apply topically.     ondansetron (ZOFRAN) 4 MG tablet Take 1 tablet (4 mg total) by mouth every 4 (four) hours as needed for nausea. 90 tablet 3   Oxycodone HCl 10 MG TABS Take 1 tablet (10 mg total) by mouth every 6 (six) hours as needed (for severe pain). 60 tablet 0   pantoprazole (PROTONIX) 40 MG tablet Take 1 tablet (40 mg total) by mouth 2 (two) times daily. 60 tablet 5   prochlorperazine (COMPAZINE) 10 MG tablet Take 1 tablet (10 mg total) by mouth  every 6 (six) hours as needed for nausea or vomiting. 90 tablet 3   rivaroxaban (XARELTO) 20 MG TABS tablet Take 20 mg by mouth daily with supper.     SIMBRINZA 1-0.2 % SUSP Place 1 drop into the right eye 2 (two) times daily.     No current facility-administered medications for this visit.

## 2022-04-24 NOTE — Assessment & Plan Note (Signed)
She had mild leukocytosis in September felt to be due to prednisone treatment.  She has recurrent leukocytosis.  Her surgical wound looks good. She does not have any symptoms concerning for infection.  I will obtain a urinalysis and urine culture.

## 2022-04-24 NOTE — Assessment & Plan Note (Signed)
Brain metastases diagnosed in April 2022. This presented with right sided head pain and pain of the right eye. She received stereotactic radiation to the two brain lesions. MRI brain in September revealed four metastatic deposits in the brain. Several show mild associated hemorrhage. Mild edema. No mass-effect or midline shift.  She was placed on immunotherapy with pembrolizumab every 3 weeks in October.  MRI brain in January revealed a mixed response with one new enhancing lesion in the right cerebellar hemisphere measuring 3 mm. One lesion in the left frontal lobe has resolved, one lesion in the right occipital lobe is stable, and two lesions were mildly decreased in size.    MRI brain in May revealed resolution of the right cerebellar lesion and decreased size of the right occipital and bilateral frontal lesions.  MRI brain on September 19 revealed stable previously noted right frontal, right occipital, and left frontal lesions, all measuring less than 4 mm. No new enhancing lesions were seen.

## 2022-04-24 NOTE — Assessment & Plan Note (Addendum)
Stage IV lung cancer diagnosed in April 2022.  She received 1 dose of chemotherapy in June 2022, withcombination cisplatin/paclitaxel/pembrolizumab, and tolerated this very poorly.  She ended up in the hospitaltwice.  Since October 2022, she has been receiving palliative pembrolizumab and has tolerated this well.  She took a break and went to [place] for a time and so she missed some doses.  Also, while she was away, she fell and had a left hip fracture requiring surgery.  She was seen in May with a CT chest and MRI head prior to resuming pembrolizumab after her break.  Her disease appeared stable, so we continued with pembrolizumab every 3 weeks.   CT chest on September 21st revealed a slight increase in the right lower lobe nodule measuring 2.4 x 2.2 x 2 cm previously 1.9 x 1.6 cm.  Scattered pulmonary nodules, the largest measuring 7 mm, are stable.  As her disease was not growing rapidly, we recommended continuing  palliative pembrolizumab, as she did not tolerate chemotherapy previously.  She then fell and sustained a right hip fracture requiring surgery prior to her 13th cycle.  We will resume pembrolizumab and proceed with a 13th cycle of pembrolizumab next available.  We will plan to see her back in 3 weeks for repeat clinical assessment prior to 14th cycle.

## 2022-04-25 ENCOUNTER — Other Ambulatory Visit: Payer: Self-pay

## 2022-04-26 ENCOUNTER — Telehealth: Payer: Self-pay

## 2022-04-26 ENCOUNTER — Other Ambulatory Visit: Payer: Self-pay | Admitting: Hematology and Oncology

## 2022-04-26 DIAGNOSIS — C7931 Secondary malignant neoplasm of brain: Secondary | ICD-10-CM

## 2022-04-26 DIAGNOSIS — C3431 Malignant neoplasm of lower lobe, right bronchus or lung: Secondary | ICD-10-CM

## 2022-04-26 MED ORDER — OXYCODONE HCL 10 MG PO TABS: 10.0000 mg | ORAL_TABLET | Freq: Four times a day (QID) | ORAL | 0 refills | Status: DC | PRN

## 2022-04-26 MED ORDER — SULFAMETHOXAZOLE-TRIMETHOPRIM 800-160 MG PO TABS: 1.0000 | ORAL_TABLET | Freq: Two times a day (BID) | ORAL | 0 refills | Status: DC

## 2022-04-26 MED FILL — Pembrolizumab IV Soln 100 MG/4ML (25 MG/ML): INTRAVENOUS | Qty: 8 | Status: AC

## 2022-04-26 NOTE — Telephone Encounter (Signed)
Message left for patients daughter Loletha Carrow.

## 2022-04-26 NOTE — Telephone Encounter (Signed)
-----   Message from [name], PA-C sent at 04/26/2022  9:05 AM EDT ----- Please let her daughter know urine culture reported this morning reveals infection. I have sent in a prescription for Bactrim DS twice daily for 10 days if she could get that started today. Thanks

## 2022-04-27 ENCOUNTER — Inpatient Hospital Stay: Payer: Medicare Other | Attending: Oncology

## 2022-04-27 VITALS — BP 129/63 | HR 70 | Temp 98.2°F | Resp 18 | Ht 65.9 in | Wt 129.8 lb

## 2022-04-27 DIAGNOSIS — Z5112 Encounter for antineoplastic immunotherapy: Secondary | ICD-10-CM | POA: Diagnosis not present

## 2022-04-27 DIAGNOSIS — C7931 Secondary malignant neoplasm of brain: Secondary | ICD-10-CM | POA: Insufficient documentation

## 2022-04-27 DIAGNOSIS — D500 Iron deficiency anemia secondary to blood loss (chronic): Secondary | ICD-10-CM | POA: Insufficient documentation

## 2022-04-27 DIAGNOSIS — Z23 Encounter for immunization: Secondary | ICD-10-CM | POA: Insufficient documentation

## 2022-04-27 DIAGNOSIS — D72829 Elevated white blood cell count, unspecified: Secondary | ICD-10-CM | POA: Diagnosis not present

## 2022-04-27 DIAGNOSIS — C3431 Malignant neoplasm of lower lobe, right bronchus or lung: Secondary | ICD-10-CM | POA: Diagnosis not present

## 2022-04-27 MED ORDER — INFLUENZA VAC SPLIT QUAD 0.5 ML IM SUSY
0.5000 mL | PREFILLED_SYRINGE | Freq: Once | INTRAMUSCULAR | Status: AC
  Administered 2022-04-27: 0.5 mL via INTRAMUSCULAR
  Filled 2022-04-27: qty 0.5

## 2022-04-27 MED ORDER — SODIUM CHLORIDE 0.9 % IV SOLN: Freq: Once | INTRAVENOUS | Status: AC

## 2022-04-27 MED ORDER — HEPARIN SOD (PORK) LOCK FLUSH 100 UNIT/ML IV SOLN
500.0000 [IU] | Freq: Once | INTRAVENOUS | Status: AC | PRN
  Administered 2022-04-27: 500 [IU]

## 2022-04-27 MED ORDER — SODIUM CHLORIDE 0.9% FLUSH
10.0000 mL | INTRAVENOUS | Status: DC | PRN
  Administered 2022-04-27: 10 mL

## 2022-04-27 MED ORDER — SODIUM CHLORIDE 0.9 % IV SOLN
200.0000 mg | Freq: Once | INTRAVENOUS | Status: AC
  Administered 2022-04-27: 200 mg via INTRAVENOUS
  Filled 2022-04-27: qty 8

## 2022-04-27 MED ORDER — SODIUM CHLORIDE 0.9 % IV SOLN
510.0000 mg | Freq: Once | INTRAVENOUS | Status: AC
  Administered 2022-04-27: 510 mg via INTRAVENOUS
  Filled 2022-04-27: qty 17

## 2022-04-27 NOTE — Patient Instructions (Signed)
Pembrolizumab Injection What is this medication? PEMBROLIZUMAB (PEM broe LIZ ue mab) treats some types of cancer. It works by helping your immune system slow or stop the spread of cancer cells. It is a monoclonal antibody. This medicine may be used for other purposes; ask your health care provider or pharmacist if you have questions. COMMON BRAND NAME(S): Keytruda What should I tell my care team before I take this medication? They need to know if you have any of these conditions: Allogeneic stem cell transplant (uses someone else's stem cells) Autoimmune diseases, such as Crohn disease, ulcerative colitis, lupus History of chest radiation Nervous system problems, such as Guillain-Barre syndrome, myasthenia gravis Organ transplant An unusual or allergic reaction to pembrolizumab, other medications, foods, dyes, or preservatives Pregnant or trying to get pregnant Breast-feeding How should I use this medication? This medication is injected into a vein. It is given by your care team in a hospital or clinic setting. A special MedGuide will be given to you before each treatment. Be sure to read this information carefully each time. Talk to your care team about the use of this medication in children. While it may be prescribed for children as young as 6 months for selected conditions, precautions do apply. Overdosage: If you think you have taken too much of this medicine contact a poison control center or emergency room at once. NOTE: This medicine is only for you. Do not share this medicine with others. What if I miss a dose? Keep appointments for follow-up doses. It is important not to miss your dose. Call your care team if you are unable to keep an appointment. What may interact with this medication? Interactions have not been studied. This list may not describe all possible interactions. Give your health care provider a list of all the medicines, herbs, non-prescription drugs, or dietary  supplements you use. Also tell them if you smoke, drink alcohol, or use illegal drugs. Some items may interact with your medicine. What should I watch for while using this medication? Your condition will be monitored carefully while you are receiving this medication. You may need blood work while taking this medication. This medication may cause serious skin reactions. They can happen weeks to months after starting the medication. Contact your care team right away if you notice fevers or flu-like symptoms with a rash. The rash may be red or purple and then turn into blisters or peeling of the skin. You may also notice a red rash with swelling of the face, lips, or lymph nodes in your neck or under your arms. Tell your care team right away if you have any change in your eyesight. Talk to your care team if you may be pregnant. Serious birth defects can occur if you take this medication during pregnancy and for 4 months after the last dose. You will need a negative pregnancy test before starting this medication. Contraception is recommended while taking this medication and for 4 months after the last dose. Your care team can help you find the option that works for you. Do not breastfeed while taking this medication and for 4 months after the last dose. What side effects may I notice from receiving this medication? Side effects that you should report to your care team as soon as possible: Allergic reactions--skin rash, itching, hives, swelling of the face, lips, tongue, or throat Dry cough, shortness of breath or trouble breathing Eye pain, redness, irritation, or discharge with blurry or decreased vision Heart muscle inflammation--unusual weakness or  fatigue, shortness of breath, chest pain, fast or irregular heartbeat, dizziness, swelling of the ankles, feet, or hands Hormone gland problems--headache, sensitivity to light, unusual weakness or fatigue, dizziness, fast or irregular heartbeat, increased  sensitivity to cold or heat, excessive sweating, constipation, hair loss, increased thirst or amount of urine, tremors or shaking, irritability Infusion reactions--chest pain, shortness of breath or trouble breathing, feeling faint or lightheaded Kidney injury (glomerulonephritis)--decrease in the amount of urine, red or dark brown urine, foamy or bubbly urine, swelling of the ankles, hands, or feet Liver injury--right upper belly pain, loss of appetite, nausea, light-colored stool, dark yellow or brown urine, yellowing skin or eyes, unusual weakness or fatigue Pain, tingling, or numbness in the hands or feet, muscle weakness, change in vision, confusion or trouble speaking, loss of balance or coordination, trouble walking, seizures Rash, fever, and swollen lymph nodes Redness, blistering, peeling, or loosening of the skin, including inside the mouth Sudden or severe stomach pain, bloody diarrhea, fever, nausea, vomiting Side effects that usually do not require medical attention (report to your care team if they continue or are bothersome): Bone, joint, or muscle pain Diarrhea Fatigue Loss of appetite Nausea Skin rash This list may not describe all possible side effects. Call your doctor for medical advice about side effects. You may report side effects to FDA at 1-800-FDA-1088. Where should I keep my medication? This medication is given in a hospital or clinic. It will not be stored at home. NOTE: This sheet is a summary. It may not cover all possible information. If you have questions about this medicine, talk to your doctor, pharmacist, or health care provider.  2023 Elsevier/Gold Standard (2013-03-03 00:00:00) Ferumoxytol Injection What is this medication? FERUMOXYTOL (FER ue MOX i tol) treats low levels of iron in your body (iron deficiency anemia). Iron is a mineral that plays an important role in making red blood cells, which carry oxygen from your lungs to the rest of your body. This  medicine may be used for other purposes; ask your health care provider or pharmacist if you have questions. COMMON BRAND NAME(S): Feraheme What should I tell my care team before I take this medication? They need to know if you have any of these conditions: Anemia not caused by low iron levels High levels of iron in the blood Magnetic resonance imaging (MRI) test scheduled An unusual or allergic reaction to iron, other medications, foods, dyes, or preservatives Pregnant or trying to get pregnant Breastfeeding How should I use this medication? This medication is injected into a vein. It is given by your care team in a hospital or clinic setting. Talk to your care team the use of this medication in children. Special care may be needed. Overdosage: If you think you have taken too much of this medicine contact a poison control center or emergency room at once. NOTE: This medicine is only for you. Do not share this medicine with others. What if I miss a dose? It is important not to miss your dose. Call your care team if you are unable to keep an appointment. What may interact with this medication? Other iron products This list may not describe all possible interactions. Give your health care provider a list of all the medicines, herbs, non-prescription drugs, or dietary supplements you use. Also tell them if you smoke, drink alcohol, or use illegal drugs. Some items may interact with your medicine. What should I watch for while using this medication? Visit your care team regularly. Tell your care  team if your symptoms do not start to get better or if they get worse. You may need blood work done while you are taking this medication. You may need to follow a special diet. Talk to your care team. Foods that contain iron include: whole grains/cereals, dried fruits, beans, or peas, leafy green vegetables, and organ meats (liver, kidney). What side effects may I notice from receiving this  medication? Side effects that you should report to your care team as soon as possible: Allergic reactions--skin rash, itching, hives, swelling of the face, lips, tongue, or throat Low blood pressure--dizziness, feeling faint or lightheaded, blurry vision Shortness of breath Side effects that usually do not require medical attention (report to your care team if they continue or are bothersome): Flushing Headache Joint pain Muscle pain Nausea Pain, redness, or irritation at injection site This list may not describe all possible side effects. Call your doctor for medical advice about side effects. You may report side effects to FDA at 1-800-FDA-1088. Where should I keep my medication? This medication is given in a hospital or clinic and will not be stored at home. NOTE: This sheet is a summary. It may not cover all possible information. If you have questions about this medicine, talk to your doctor, pharmacist, or health care provider.  2023 Elsevier/Gold Standard (2020-11-03 00:00:00)

## 2022-04-28 DIAGNOSIS — H9190 Unspecified hearing loss, unspecified ear: Secondary | ICD-10-CM | POA: Diagnosis not present

## 2022-04-28 DIAGNOSIS — Z9181 History of falling: Secondary | ICD-10-CM | POA: Diagnosis not present

## 2022-04-28 DIAGNOSIS — E1140 Type 2 diabetes mellitus with diabetic neuropathy, unspecified: Secondary | ICD-10-CM | POA: Diagnosis not present

## 2022-04-28 DIAGNOSIS — D631 Anemia in chronic kidney disease: Secondary | ICD-10-CM | POA: Diagnosis not present

## 2022-04-28 DIAGNOSIS — I4891 Unspecified atrial fibrillation: Secondary | ICD-10-CM | POA: Diagnosis not present

## 2022-04-28 DIAGNOSIS — I252 Old myocardial infarction: Secondary | ICD-10-CM | POA: Diagnosis not present

## 2022-04-28 DIAGNOSIS — D630 Anemia in neoplastic disease: Secondary | ICD-10-CM | POA: Diagnosis not present

## 2022-04-28 DIAGNOSIS — Z951 Presence of aortocoronary bypass graft: Secondary | ICD-10-CM | POA: Diagnosis not present

## 2022-04-28 DIAGNOSIS — C3490 Malignant neoplasm of unspecified part of unspecified bronchus or lung: Secondary | ICD-10-CM | POA: Diagnosis not present

## 2022-04-28 DIAGNOSIS — Z7984 Long term (current) use of oral hypoglycemic drugs: Secondary | ICD-10-CM | POA: Diagnosis not present

## 2022-04-28 DIAGNOSIS — I130 Hypertensive heart and chronic kidney disease with heart failure and stage 1 through stage 4 chronic kidney disease, or unspecified chronic kidney disease: Secondary | ICD-10-CM | POA: Diagnosis not present

## 2022-04-28 DIAGNOSIS — Z96641 Presence of right artificial hip joint: Secondary | ICD-10-CM | POA: Diagnosis not present

## 2022-04-28 DIAGNOSIS — E1122 Type 2 diabetes mellitus with diabetic chronic kidney disease: Secondary | ICD-10-CM | POA: Diagnosis not present

## 2022-04-28 DIAGNOSIS — N182 Chronic kidney disease, stage 2 (mild): Secondary | ICD-10-CM | POA: Diagnosis not present

## 2022-04-28 DIAGNOSIS — Z7901 Long term (current) use of anticoagulants: Secondary | ICD-10-CM | POA: Diagnosis not present

## 2022-04-28 DIAGNOSIS — I69351 Hemiplegia and hemiparesis following cerebral infarction affecting right dominant side: Secondary | ICD-10-CM | POA: Diagnosis not present

## 2022-04-28 DIAGNOSIS — C7931 Secondary malignant neoplasm of brain: Secondary | ICD-10-CM | POA: Diagnosis not present

## 2022-04-28 DIAGNOSIS — I2510 Atherosclerotic heart disease of native coronary artery without angina pectoris: Secondary | ICD-10-CM | POA: Diagnosis not present

## 2022-04-28 DIAGNOSIS — I509 Heart failure, unspecified: Secondary | ICD-10-CM | POA: Diagnosis not present

## 2022-04-28 DIAGNOSIS — E785 Hyperlipidemia, unspecified: Secondary | ICD-10-CM | POA: Diagnosis not present

## 2022-04-28 DIAGNOSIS — M80051D Age-related osteoporosis with current pathological fracture, right femur, subsequent encounter for fracture with routine healing: Secondary | ICD-10-CM | POA: Diagnosis not present

## 2022-04-28 DIAGNOSIS — F17210 Nicotine dependence, cigarettes, uncomplicated: Secondary | ICD-10-CM | POA: Diagnosis not present

## 2022-04-29 ENCOUNTER — Other Ambulatory Visit: Payer: Self-pay

## 2022-05-01 DIAGNOSIS — S72001A Fracture of unspecified part of neck of right femur, initial encounter for closed fracture: Secondary | ICD-10-CM | POA: Diagnosis not present

## 2022-05-03 MED FILL — Ferumoxytol Inj 510 MG/17ML (30 MG/ML) (Elemental Fe): INTRAVENOUS | Qty: 17 | Status: AC

## 2022-05-04 ENCOUNTER — Inpatient Hospital Stay: Payer: Medicare Other

## 2022-05-04 VITALS — BP 119/56 | HR 61 | Temp 99.0°F | Resp 20 | Ht 65.9 in | Wt 126.2 lb

## 2022-05-04 DIAGNOSIS — Z7901 Long term (current) use of anticoagulants: Secondary | ICD-10-CM | POA: Diagnosis not present

## 2022-05-04 DIAGNOSIS — C3490 Malignant neoplasm of unspecified part of unspecified bronchus or lung: Secondary | ICD-10-CM | POA: Diagnosis not present

## 2022-05-04 DIAGNOSIS — Z951 Presence of aortocoronary bypass graft: Secondary | ICD-10-CM | POA: Diagnosis not present

## 2022-05-04 DIAGNOSIS — N182 Chronic kidney disease, stage 2 (mild): Secondary | ICD-10-CM | POA: Diagnosis not present

## 2022-05-04 DIAGNOSIS — Z23 Encounter for immunization: Secondary | ICD-10-CM | POA: Diagnosis not present

## 2022-05-04 DIAGNOSIS — E1122 Type 2 diabetes mellitus with diabetic chronic kidney disease: Secondary | ICD-10-CM | POA: Diagnosis not present

## 2022-05-04 DIAGNOSIS — Z7984 Long term (current) use of oral hypoglycemic drugs: Secondary | ICD-10-CM | POA: Diagnosis not present

## 2022-05-04 DIAGNOSIS — M80051D Age-related osteoporosis with current pathological fracture, right femur, subsequent encounter for fracture with routine healing: Secondary | ICD-10-CM | POA: Diagnosis not present

## 2022-05-04 DIAGNOSIS — I252 Old myocardial infarction: Secondary | ICD-10-CM | POA: Diagnosis not present

## 2022-05-04 DIAGNOSIS — E785 Hyperlipidemia, unspecified: Secondary | ICD-10-CM | POA: Diagnosis not present

## 2022-05-04 DIAGNOSIS — F17210 Nicotine dependence, cigarettes, uncomplicated: Secondary | ICD-10-CM | POA: Diagnosis not present

## 2022-05-04 DIAGNOSIS — I2510 Atherosclerotic heart disease of native coronary artery without angina pectoris: Secondary | ICD-10-CM | POA: Diagnosis not present

## 2022-05-04 DIAGNOSIS — E1140 Type 2 diabetes mellitus with diabetic neuropathy, unspecified: Secondary | ICD-10-CM | POA: Diagnosis not present

## 2022-05-04 DIAGNOSIS — Z96641 Presence of right artificial hip joint: Secondary | ICD-10-CM | POA: Diagnosis not present

## 2022-05-04 DIAGNOSIS — C7931 Secondary malignant neoplasm of brain: Secondary | ICD-10-CM | POA: Diagnosis not present

## 2022-05-04 DIAGNOSIS — I69351 Hemiplegia and hemiparesis following cerebral infarction affecting right dominant side: Secondary | ICD-10-CM | POA: Diagnosis not present

## 2022-05-04 DIAGNOSIS — D630 Anemia in neoplastic disease: Secondary | ICD-10-CM | POA: Diagnosis not present

## 2022-05-04 DIAGNOSIS — D72829 Elevated white blood cell count, unspecified: Secondary | ICD-10-CM | POA: Diagnosis not present

## 2022-05-04 DIAGNOSIS — D500 Iron deficiency anemia secondary to blood loss (chronic): Secondary | ICD-10-CM | POA: Diagnosis not present

## 2022-05-04 DIAGNOSIS — Z5112 Encounter for antineoplastic immunotherapy: Secondary | ICD-10-CM | POA: Diagnosis not present

## 2022-05-04 DIAGNOSIS — D631 Anemia in chronic kidney disease: Secondary | ICD-10-CM | POA: Diagnosis not present

## 2022-05-04 DIAGNOSIS — I4891 Unspecified atrial fibrillation: Secondary | ICD-10-CM | POA: Diagnosis not present

## 2022-05-04 DIAGNOSIS — Z9181 History of falling: Secondary | ICD-10-CM | POA: Diagnosis not present

## 2022-05-04 DIAGNOSIS — I509 Heart failure, unspecified: Secondary | ICD-10-CM | POA: Diagnosis not present

## 2022-05-04 DIAGNOSIS — I130 Hypertensive heart and chronic kidney disease with heart failure and stage 1 through stage 4 chronic kidney disease, or unspecified chronic kidney disease: Secondary | ICD-10-CM | POA: Diagnosis not present

## 2022-05-04 DIAGNOSIS — C3431 Malignant neoplasm of lower lobe, right bronchus or lung: Secondary | ICD-10-CM | POA: Diagnosis not present

## 2022-05-04 DIAGNOSIS — H9190 Unspecified hearing loss, unspecified ear: Secondary | ICD-10-CM | POA: Diagnosis not present

## 2022-05-04 MED ORDER — EPINEPHRINE 0.3 MG/0.3ML IJ SOAJ: 0.3000 mg | Freq: Once | INTRAMUSCULAR | Status: DC | PRN

## 2022-05-04 MED ORDER — DIPHENHYDRAMINE HCL 50 MG/ML IJ SOLN: 50.0000 mg | Freq: Once | INTRAMUSCULAR | Status: DC | PRN

## 2022-05-04 MED ORDER — SODIUM CHLORIDE 0.9 % IV SOLN
510.0000 mg | Freq: Once | INTRAVENOUS | Status: AC
  Administered 2022-05-04: 510 mg via INTRAVENOUS
  Filled 2022-05-04: qty 510

## 2022-05-04 MED ORDER — METHYLPREDNISOLONE SODIUM SUCC 125 MG IJ SOLR: 125.0000 mg | Freq: Once | INTRAMUSCULAR | Status: DC | PRN

## 2022-05-04 MED ORDER — HEPARIN SOD (PORK) LOCK FLUSH 100 UNIT/ML IV SOLN
500.0000 [IU] | Freq: Once | INTRAVENOUS | Status: AC | PRN
  Administered 2022-05-04: 500 [IU]

## 2022-05-04 MED ORDER — ALBUTEROL SULFATE HFA 108 (90 BASE) MCG/ACT IN AERS: 2.0000 | INHALATION_SPRAY | Freq: Once | RESPIRATORY_TRACT | Status: DC | PRN

## 2022-05-04 MED ORDER — SODIUM CHLORIDE 0.9 % IV SOLN: Freq: Once | INTRAVENOUS | Status: DC | PRN

## 2022-05-04 MED ORDER — FAMOTIDINE IN NACL 20-0.9 MG/50ML-% IV SOLN: 20.0000 mg | Freq: Once | INTRAVENOUS | Status: DC | PRN

## 2022-05-04 MED ORDER — SODIUM CHLORIDE 0.9 % IV SOLN: Freq: Once | INTRAVENOUS | Status: AC

## 2022-05-04 MED ORDER — SODIUM CHLORIDE 0.9% FLUSH
10.0000 mL | Freq: Once | INTRAVENOUS | Status: AC | PRN
  Administered 2022-05-04: 10 mL

## 2022-05-04 NOTE — Patient Instructions (Signed)
Iron-Rich Diet  Iron is a mineral that helps your body produce hemoglobin. Hemoglobin is a protein in red blood cells that carries oxygen to your body's tissues. Eating too little iron may cause you to feel weak and tired, and it can increase your risk of infection. Iron is naturally found in many foods, and many foods have iron added to them (are iron-fortified). You may need to follow an iron-rich diet if you do not have enough iron in your body due to certain medical conditions. The amount of iron that you need each day depends on your age, your sex, and any medical conditions you have. Follow instructions from your health care provider or a dietitian about how much iron you should eat each day. What are tips for following this plan? Reading food labels Check food labels to see how many milligrams (mg) of iron are in each serving. Cooking Cook foods in pots and pans that are made from iron. Take these steps to make it easier for your body to absorb iron from certain foods: Soak beans overnight before cooking. Soak whole grains overnight and drain them before using. Ferment flours before baking, such as by using yeast in bread dough. Meal planning When you eat foods that contain iron, you should eat them with foods that are high in vitamin C. These include oranges, peppers, tomatoes, potatoes, and mangoes. Vitamin C helps your body absorb iron. Certain foods and drinks prevent your body from absorbing iron properly. Avoid eating these foods in the same meal as iron-rich foods or with iron supplements. These foods include: Coffee, black tea, and red wine. Milk, dairy products, and foods that are high in calcium. Beans and soybeans. Whole grains. General information Take iron supplements only as told by your health care provider. An overdose of iron can be life-threatening. If you were prescribed iron supplements, take them with orange juice or a vitamin C supplement. When you eat  iron-fortified foods or take an iron supplement, you should also eat foods that naturally contain iron, such as meat, poultry, and fish. Eating naturally iron-rich foods helps your body absorb the iron that is added to other foods or contained in a supplement. Iron from animal sources is better absorbed than iron from plant sources. What foods should I eat? Fruits Prunes. Raisins. Eat fruits high in vitamin C, such as oranges, grapefruits, and strawberries, with iron-rich foods. Vegetables Spinach (cooked). Green peas. Broccoli. Fermented vegetables. Eat vegetables high in vitamin C, such as leafy greens, potatoes, bell peppers, and tomatoes, with iron-rich foods. Grains Iron-fortified breakfast cereal. Iron-fortified whole-wheat bread. Enriched rice. Sprouted grains. Meats and other proteins Beef liver. Beef. [place]. Chicken. Oysters. Shrimp. [place]. Sardines. Chickpeas. Nuts. Tofu. Pumpkin seeds. Beverages Tomato juice. Fresh orange juice. Prune juice. Hibiscus tea. Iron-fortified instant breakfast shakes. Sweets and desserts Blackstrap molasses. Seasonings and condiments Tahini. Fermented soy sauce. Other foods Wheat germ. The items listed above may not be a complete list of recommended foods and beverages. Contact a dietitian for more information. What foods should I limit? These are foods that should be limited while eating iron-rich foods as they can reduce the absorption of iron in your body. Grains Whole grains. Bran cereal. Bran flour. Meats and other proteins Soybeans. Products made from soy protein. Black beans. Lentils. Mung beans. Split peas. Dairy Milk. Cream. Cheese. Yogurt. Cottage cheese. Beverages Coffee. Black tea. Red wine. Sweets and desserts Cocoa. Chocolate. Ice cream. Seasonings and condiments Basil. Oregano. Large amounts of parsley. The items listed  above may not be a complete list of foods and beverages you should limit. Contact a dietitian for more  information. Summary Iron is a mineral that helps your body produce hemoglobin. Hemoglobin is a protein in red blood cells that carries oxygen to your body's tissues. Iron is naturally found in many foods, and many foods have iron added to them (are iron-fortified). When you eat foods that contain iron, you should eat them with foods that are high in vitamin C. Vitamin C helps your body absorb iron. Certain foods and drinks prevent your body from absorbing iron properly, such as whole grains and dairy products. You should avoid eating these foods in the same meal as iron-rich foods or with iron supplements. This information is not intended to replace advice given to you by your health care provider. Make sure you discuss any questions you have with your health care provider. Document Revised: 05/24/2020 Document Reviewed: 05/24/2020 Elsevier Patient Education  [place]. Iron Deficiency Anemia, Adult  Iron deficiency anemia is a condition in which the concentration of red blood cells or hemoglobin in the blood is below normal because of too little iron. Hemoglobin is a substance in red blood cells that carries oxygen to the body's tissues. When the concentration of red blood cells or hemoglobin is too low, not enough oxygen reaches these tissues. Iron deficiency anemia is usually long-lasting, and it develops over time. It may or may not cause symptoms. It is a common type of anemia. What are the causes? This condition may be caused by: Not enough iron in the diet. Abnormal absorption in the gut. Blood loss. What increases the risk? You are more likely to develop this condition if you get menstrual periods (menstruate) or are pregnant. What are the signs or symptoms? Symptoms of this condition may include: Pale skin, lips, and nail beds. Weakness, dizziness, and getting tired easily. Shortness of breath when moving or exercising. Cold hands or feet. Mild anemia may not cause any  symptoms. How is this diagnosed? This condition is diagnosed based on: Your medical history. A physical exam. Blood tests. How is this treated? This condition is treated by correcting the cause of your iron deficiency. Treatment may involve: Adding iron-rich foods to your diet. Taking iron supplements. If you are pregnant or breastfeeding, you may need to take extra iron because your normal diet usually does not provide the amount of iron that you need. Increasing vitamin C intake. Vitamin C helps your body absorb iron. Your health care provider may recommend that you take iron supplements along with a glass of orange juice or a vitamin C supplement. Medicines to make heavy menstrual flow lighter. Surgery or additional testing procedures to determine the cause of your anemia. You may need repeat blood tests to determine whether treatment is working. If the treatment does not seem to be working, you may need more tests. Follow these instructions at home: Medicines Take over-the-counter and prescription medicines only as told by your health care provider. This includes iron supplements and vitamins. This is important because too much iron can be harmful. For the best iron absorption, you should take iron supplements when your stomach is empty. If you cannot tolerate them on an empty stomach, you may need to take them with food. Do not drink milk or take antacids at the same time as your iron supplements. Milk and antacids may interfere with how your body absorbs iron. Iron supplements may turn stool (feces) a darker color and  it may appear black. If you cannot tolerate taking iron supplements by mouth, talk with your health care provider about taking them through an IV or through an injection into a muscle. Eating and drinking Talk with your health care provider before changing your diet. Your provider may recommend that you eat foods that contain a lot of iron, such as: Liver. Low-fat (lean)  beef. Breads and cereals that have iron added to them (are fortified). Eggs. Dried fruit. Dark green, leafy vegetables. To help your body use the iron from iron-rich foods, eat those foods at the same time as fresh fruits and vegetables that are high in vitamin C. Foods that are high in vitamin C include: Oranges. Peppers. Tomatoes. Mangoes. Managing constipation If you are taking an iron supplement, it may cause constipation. To prevent or treat constipation, you may need to: Drink enough fluid to keep your urine pale yellow. Take over-the-counter or prescription medicines. Eat foods that are high in fiber, such as beans, whole grains, and fresh fruits and vegetables. Limit foods that are high in fat and processed sugars, such as fried or sweet foods. General instructions Return to your normal activities as told by your health care provider. Ask your health care provider what activities are safe for you. Keep all follow-up visits. Contact a health care provider if: You feel nauseous or you vomit. You feel weak. You become light-headed when getting up from a sitting or lying down position. You have unexplained sweating. You develop symptoms of constipation. You have a heaviness in your chest. You have trouble breathing with physical activity. Get help right away if: You faint. If this happens, do not drive yourself to the hospital. You have an irregular or rapid heartbeat. Summary Iron deficiency anemia is a condition in which the concentration of red blood cells or hemoglobin in the blood is below normal because of too little iron. This condition is treated by correcting the cause of your iron deficiency. Take over-the-counter and prescription medicines only as told by your health care provider. This includes iron supplements and vitamins. To help your body use the iron from iron-rich foods, eat those foods at the same time as fresh fruits and vegetables that are high in vitamin  C. Seek medical help if you have signs or symptoms of worsening anemia. This information is not intended to replace advice given to you by your health care provider. Make sure you discuss any questions you have with your health care provider. Document Revised: 07/20/2021 Document Reviewed: 07/20/2021 Elsevier Patient Education  [place].

## 2022-05-05 ENCOUNTER — Telehealth: Payer: Self-pay | Admitting: Hematology and Oncology

## 2022-05-05 DIAGNOSIS — D631 Anemia in chronic kidney disease: Secondary | ICD-10-CM | POA: Diagnosis not present

## 2022-05-05 DIAGNOSIS — I509 Heart failure, unspecified: Secondary | ICD-10-CM | POA: Diagnosis not present

## 2022-05-05 DIAGNOSIS — Z951 Presence of aortocoronary bypass graft: Secondary | ICD-10-CM | POA: Diagnosis not present

## 2022-05-05 DIAGNOSIS — E1140 Type 2 diabetes mellitus with diabetic neuropathy, unspecified: Secondary | ICD-10-CM | POA: Diagnosis not present

## 2022-05-05 DIAGNOSIS — C3490 Malignant neoplasm of unspecified part of unspecified bronchus or lung: Secondary | ICD-10-CM | POA: Diagnosis not present

## 2022-05-05 DIAGNOSIS — I4891 Unspecified atrial fibrillation: Secondary | ICD-10-CM | POA: Diagnosis not present

## 2022-05-05 DIAGNOSIS — N182 Chronic kidney disease, stage 2 (mild): Secondary | ICD-10-CM | POA: Diagnosis not present

## 2022-05-05 DIAGNOSIS — I2510 Atherosclerotic heart disease of native coronary artery without angina pectoris: Secondary | ICD-10-CM | POA: Diagnosis not present

## 2022-05-05 DIAGNOSIS — Z7984 Long term (current) use of oral hypoglycemic drugs: Secondary | ICD-10-CM | POA: Diagnosis not present

## 2022-05-05 DIAGNOSIS — Z7901 Long term (current) use of anticoagulants: Secondary | ICD-10-CM | POA: Diagnosis not present

## 2022-05-05 DIAGNOSIS — E785 Hyperlipidemia, unspecified: Secondary | ICD-10-CM | POA: Diagnosis not present

## 2022-05-05 DIAGNOSIS — C7931 Secondary malignant neoplasm of brain: Secondary | ICD-10-CM | POA: Diagnosis not present

## 2022-05-05 DIAGNOSIS — H9190 Unspecified hearing loss, unspecified ear: Secondary | ICD-10-CM | POA: Diagnosis not present

## 2022-05-05 DIAGNOSIS — Z96641 Presence of right artificial hip joint: Secondary | ICD-10-CM | POA: Diagnosis not present

## 2022-05-05 DIAGNOSIS — F17210 Nicotine dependence, cigarettes, uncomplicated: Secondary | ICD-10-CM | POA: Diagnosis not present

## 2022-05-05 DIAGNOSIS — Z9181 History of falling: Secondary | ICD-10-CM | POA: Diagnosis not present

## 2022-05-05 DIAGNOSIS — I130 Hypertensive heart and chronic kidney disease with heart failure and stage 1 through stage 4 chronic kidney disease, or unspecified chronic kidney disease: Secondary | ICD-10-CM | POA: Diagnosis not present

## 2022-05-05 DIAGNOSIS — I252 Old myocardial infarction: Secondary | ICD-10-CM | POA: Diagnosis not present

## 2022-05-05 DIAGNOSIS — I69351 Hemiplegia and hemiparesis following cerebral infarction affecting right dominant side: Secondary | ICD-10-CM | POA: Diagnosis not present

## 2022-05-05 DIAGNOSIS — M80051D Age-related osteoporosis with current pathological fracture, right femur, subsequent encounter for fracture with routine healing: Secondary | ICD-10-CM | POA: Diagnosis not present

## 2022-05-05 DIAGNOSIS — E1122 Type 2 diabetes mellitus with diabetic chronic kidney disease: Secondary | ICD-10-CM | POA: Diagnosis not present

## 2022-05-05 DIAGNOSIS — D630 Anemia in neoplastic disease: Secondary | ICD-10-CM | POA: Diagnosis not present

## 2022-05-05 NOTE — Telephone Encounter (Signed)
05/05/22 Rescheduled per daughters request

## 2022-05-08 DIAGNOSIS — Z7901 Long term (current) use of anticoagulants: Secondary | ICD-10-CM | POA: Diagnosis not present

## 2022-05-08 DIAGNOSIS — E1122 Type 2 diabetes mellitus with diabetic chronic kidney disease: Secondary | ICD-10-CM | POA: Diagnosis not present

## 2022-05-08 DIAGNOSIS — I4891 Unspecified atrial fibrillation: Secondary | ICD-10-CM | POA: Diagnosis not present

## 2022-05-08 DIAGNOSIS — Z9181 History of falling: Secondary | ICD-10-CM | POA: Diagnosis not present

## 2022-05-08 DIAGNOSIS — Z7984 Long term (current) use of oral hypoglycemic drugs: Secondary | ICD-10-CM | POA: Diagnosis not present

## 2022-05-08 DIAGNOSIS — I252 Old myocardial infarction: Secondary | ICD-10-CM | POA: Diagnosis not present

## 2022-05-08 DIAGNOSIS — Z96641 Presence of right artificial hip joint: Secondary | ICD-10-CM | POA: Diagnosis not present

## 2022-05-08 DIAGNOSIS — H9190 Unspecified hearing loss, unspecified ear: Secondary | ICD-10-CM | POA: Diagnosis not present

## 2022-05-08 DIAGNOSIS — I2510 Atherosclerotic heart disease of native coronary artery without angina pectoris: Secondary | ICD-10-CM | POA: Diagnosis not present

## 2022-05-08 DIAGNOSIS — D631 Anemia in chronic kidney disease: Secondary | ICD-10-CM | POA: Diagnosis not present

## 2022-05-08 DIAGNOSIS — E785 Hyperlipidemia, unspecified: Secondary | ICD-10-CM | POA: Diagnosis not present

## 2022-05-08 DIAGNOSIS — Z951 Presence of aortocoronary bypass graft: Secondary | ICD-10-CM | POA: Diagnosis not present

## 2022-05-08 DIAGNOSIS — I130 Hypertensive heart and chronic kidney disease with heart failure and stage 1 through stage 4 chronic kidney disease, or unspecified chronic kidney disease: Secondary | ICD-10-CM | POA: Diagnosis not present

## 2022-05-08 DIAGNOSIS — I69351 Hemiplegia and hemiparesis following cerebral infarction affecting right dominant side: Secondary | ICD-10-CM | POA: Diagnosis not present

## 2022-05-08 DIAGNOSIS — N182 Chronic kidney disease, stage 2 (mild): Secondary | ICD-10-CM | POA: Diagnosis not present

## 2022-05-08 DIAGNOSIS — D630 Anemia in neoplastic disease: Secondary | ICD-10-CM | POA: Diagnosis not present

## 2022-05-08 DIAGNOSIS — F17210 Nicotine dependence, cigarettes, uncomplicated: Secondary | ICD-10-CM | POA: Diagnosis not present

## 2022-05-08 DIAGNOSIS — E1140 Type 2 diabetes mellitus with diabetic neuropathy, unspecified: Secondary | ICD-10-CM | POA: Diagnosis not present

## 2022-05-08 DIAGNOSIS — C7931 Secondary malignant neoplasm of brain: Secondary | ICD-10-CM | POA: Diagnosis not present

## 2022-05-08 DIAGNOSIS — C3490 Malignant neoplasm of unspecified part of unspecified bronchus or lung: Secondary | ICD-10-CM | POA: Diagnosis not present

## 2022-05-08 DIAGNOSIS — M80051D Age-related osteoporosis with current pathological fracture, right femur, subsequent encounter for fracture with routine healing: Secondary | ICD-10-CM | POA: Diagnosis not present

## 2022-05-08 DIAGNOSIS — I509 Heart failure, unspecified: Secondary | ICD-10-CM | POA: Diagnosis not present

## 2022-05-09 ENCOUNTER — Other Ambulatory Visit: Payer: Self-pay

## 2022-05-10 DIAGNOSIS — Z9181 History of falling: Secondary | ICD-10-CM | POA: Diagnosis not present

## 2022-05-10 DIAGNOSIS — I69351 Hemiplegia and hemiparesis following cerebral infarction affecting right dominant side: Secondary | ICD-10-CM | POA: Diagnosis not present

## 2022-05-10 DIAGNOSIS — Z96641 Presence of right artificial hip joint: Secondary | ICD-10-CM | POA: Diagnosis not present

## 2022-05-10 DIAGNOSIS — N182 Chronic kidney disease, stage 2 (mild): Secondary | ICD-10-CM | POA: Diagnosis not present

## 2022-05-10 DIAGNOSIS — H9190 Unspecified hearing loss, unspecified ear: Secondary | ICD-10-CM | POA: Diagnosis not present

## 2022-05-10 DIAGNOSIS — I252 Old myocardial infarction: Secondary | ICD-10-CM | POA: Diagnosis not present

## 2022-05-10 DIAGNOSIS — I2510 Atherosclerotic heart disease of native coronary artery without angina pectoris: Secondary | ICD-10-CM | POA: Diagnosis not present

## 2022-05-10 DIAGNOSIS — Z7901 Long term (current) use of anticoagulants: Secondary | ICD-10-CM | POA: Diagnosis not present

## 2022-05-10 DIAGNOSIS — E1140 Type 2 diabetes mellitus with diabetic neuropathy, unspecified: Secondary | ICD-10-CM | POA: Diagnosis not present

## 2022-05-10 DIAGNOSIS — Z951 Presence of aortocoronary bypass graft: Secondary | ICD-10-CM | POA: Diagnosis not present

## 2022-05-10 DIAGNOSIS — E1122 Type 2 diabetes mellitus with diabetic chronic kidney disease: Secondary | ICD-10-CM | POA: Diagnosis not present

## 2022-05-10 DIAGNOSIS — I130 Hypertensive heart and chronic kidney disease with heart failure and stage 1 through stage 4 chronic kidney disease, or unspecified chronic kidney disease: Secondary | ICD-10-CM | POA: Diagnosis not present

## 2022-05-10 DIAGNOSIS — E785 Hyperlipidemia, unspecified: Secondary | ICD-10-CM | POA: Diagnosis not present

## 2022-05-10 DIAGNOSIS — F17210 Nicotine dependence, cigarettes, uncomplicated: Secondary | ICD-10-CM | POA: Diagnosis not present

## 2022-05-10 DIAGNOSIS — D630 Anemia in neoplastic disease: Secondary | ICD-10-CM | POA: Diagnosis not present

## 2022-05-10 DIAGNOSIS — I509 Heart failure, unspecified: Secondary | ICD-10-CM | POA: Diagnosis not present

## 2022-05-10 DIAGNOSIS — Z7984 Long term (current) use of oral hypoglycemic drugs: Secondary | ICD-10-CM | POA: Diagnosis not present

## 2022-05-10 DIAGNOSIS — C3490 Malignant neoplasm of unspecified part of unspecified bronchus or lung: Secondary | ICD-10-CM | POA: Diagnosis not present

## 2022-05-10 DIAGNOSIS — D631 Anemia in chronic kidney disease: Secondary | ICD-10-CM | POA: Diagnosis not present

## 2022-05-10 DIAGNOSIS — M80051D Age-related osteoporosis with current pathological fracture, right femur, subsequent encounter for fracture with routine healing: Secondary | ICD-10-CM | POA: Diagnosis not present

## 2022-05-10 DIAGNOSIS — C7931 Secondary malignant neoplasm of brain: Secondary | ICD-10-CM | POA: Diagnosis not present

## 2022-05-10 DIAGNOSIS — I4891 Unspecified atrial fibrillation: Secondary | ICD-10-CM | POA: Diagnosis not present

## 2022-05-11 ENCOUNTER — Other Ambulatory Visit: Payer: Medicare Other

## 2022-05-11 ENCOUNTER — Ambulatory Visit: Payer: Medicare Other | Admitting: Hematology and Oncology

## 2022-05-11 NOTE — Progress Notes (Signed)
[facility]  [address] [place],  [place]  [address] [phone]  Clinic Day:  05/12/2022  Referring physician: Townsend Roger, MD  ASSESSMENT & PLAN:   Assessment & Plan: Lung cancer, lower lobe ([place]) Stage IV lung cancer diagnosed in April 2022.  She received 1 dose of chemotherapy in June 2022, with combination cisplatin/paclitaxel/pembrolizumab, and tolerated this very poorly.  She ended up in the hospital twice.  Since October 2022, she has been receiving palliative pembrolizumab and has tolerated this well.  She took a break and went to [place] for a time and so she missed some doses.  Also, while she was away, she fell and had a left hip fracture requiring surgery.  She was seen in May with a CT chest and MRI head prior to resuming pembrolizumab after her break.  Her disease appeared stable, so we continued with pembrolizumab every 3 weeks.   CT chest on September 21st revealed a slight increase in the right lower lobe nodule measuring 2.4 x 2.2 x 2 cm previously 1.9 x 1.6 cm.  Scattered pulmonary nodules, the largest measuring 7 mm, are stable.  As her disease was not growing rapidly, we recommended continuing  palliative pembrolizumab, as she did not tolerate chemotherapy previously.  She then fell and sustained a right hip fracture requiring surgery.  Pembrolizumab was resumed 3 weeks ago.  She has had increased general weakness and fatigue since that time, but also received IV Feraheme.  She feels the [name] is what caused her those symptoms.  Unfortunately, it is most likely her immunotherapy.  When I explained this and recommended we delay the treatment, she became upset, stating that she feels there is nothing we can do for her if the treatment is causing the fatigue and weakness.  She asked how many more treatments were planned.  I told her I had planned to give her 3 more treatments prior to repeating a CT scan to see where we stand with her  cancer.  She did not want to delay or discontinue her pembrolizumab treatment.  She will proceed with a 14th cycle of pembrolizumab next week.  We will plan to see her back in 3 weeks for repeat clinical assessment prior to 15th cycle.  We may wish to repeat a CT chest after the 15th cycle.  Iron deficiency anemia due to chronic blood loss The patient developed microcytic anemia and was found to be iron deficient, felt to be due to GI blood loss.  We advised her to increase her pantoprazole 40 mg to twice daily.  At her visit in September, she had not started oral iron, so I recommended she receive IV iron in the form of Feraheme.  She continued to report dark stool, but due to her multiple medical comorbidities, we decided to hold off on GI referral.  Due to her fall and right hip fracture, she ha not received IV iron yet, so Feraheme was given earlier this month.  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  This presented with right sided head pain and pain of the right eye.  She received stereotactic radiation to the two brain lesions. MRI brain in September revealed four metastatic deposits in the brain. Several show mild associated hemorrhage. Mild edema. No mass-effect or midline shift.  She was placed on immunotherapy with pembrolizumab every 3 weeks in October.  MRI brain in January revealed a mixed response with one new enhancing  lesion in the right cerebellar hemisphere measuring 3 mm. One lesion in the left frontal lobe has resolved, one lesion in the right occipital lobe is stable, and two lesions were mildly decreased in size.    MRI brain in May revealed resolution of the right cerebellar lesion and decreased size of the right occipital and bilateral frontal lesions.  MRI brain on September 19th revealed stable previously noted right frontal, right occipital, and left frontal lesions, all measuring less than 4 mm. No new enhancing lesions were seen.      The patient understands the plans discussed today and is in agreement with them.  She knows to contact our office if she develops concerns prior to her next appointment.    [name], PA-C  [facility] AT [facility] [address] [place] [address] [place] [address] Dept: [phone] Dept Fax: [phone]   No orders of the defined types were placed in this encounter.     CHIEF COMPLAINT:  CC: Stage IV squamous cell lung cancer  Current Treatment: Palliative pembrolizumab every 3 weeks  HISTORY OF PRESENT ILLNESS:   Oncology History  Lung cancer, lower lobe ([place])  07/07/2020 Initial Diagnosis   Lung cancer, lower lobe ([place])   10/15/2020 Cancer Staging   Staging form: Lung, AJCC 8th Edition - Clinical stage from 10/15/2020: Stage IV (cT1b, cN0, cM1) - Signed by Derwood Kaplan, MD on 03/31/2021 Histopathologic type: Squamous cell carcinoma, NOS Stage prefix: Initial diagnosis Histologic grade (G): G3 Histologic grading system: 4 grade system Laterality: Right Tumor size (mm): 18 Sites of metastasis: Brain Lymph-vascular invasion (LVI): Presence of LVI unknown/indeterminate Diagnostic confirmation: Positive histology Specimen type: Core Needle Biopsy Staged by: Managing physician Type of lung cancer: Locally advanced or metastatic non-small cell lung cancer Stage used in treatment planning: Yes National guidelines used in treatment planning: Yes Type of national guideline used in treatment planning: NCCN   04/08/2021 - 02/07/2022 Chemotherapy   Patient is on Treatment Plan : LUNG NSCLC flat dose Pembrolizumab Q21D     04/08/2021 -  Chemotherapy   Patient is on Treatment Plan : LUNG NSCLC Pembrolizumab (200) q21d         INTERVAL HISTORY:  [name] is here today for repeat clinical assessment prior to 14th cycle of pembrolizumab.  She states reports increased fatigue and weakness since receiving IV iron. I told her I  thought this was due to her cancer treatment and recommended a delay in treatment.  She was upset by this and did not wish to delay stating there's nothing we can do for her then, with the treatment causing side effects, but she needs it to treat her cancer.  She also reports increased right hip pain, that she attributes to the IV iron.  She is scheduled to see Ortho again next month.  She denies increased cough or shortness of breath.  She denies diarrhea.  She denies skin rashes.  She denies fevers or chills. She denies pain. Her appetite is good. Her weight has increased 3 pounds over last 3 weeks .  Prior to her last treatment, she had a urinary tract infection with Proteus mirabilis, which was treated with Bactrim DS twice daily.  Unfortunately, she has only been taking this once a day.  I advised her to take this twice daily until it is finished.  REVIEW OF SYSTEMS:  Review of Systems  Constitutional:  Positive for fatigue. Negative for appetite change, chills, fever and unexpected weight  change.  HENT:   Negative for lump/mass, mouth sores and sore throat.   Respiratory:  Negative for cough and shortness of breath.   Cardiovascular:  Negative for chest pain and leg swelling.  Gastrointestinal:  Negative for abdominal pain, constipation, diarrhea, nausea and vomiting.  Endocrine: Negative for hot flashes.  Genitourinary:  Negative for difficulty urinating, dysuria, frequency and hematuria.   Musculoskeletal:  Positive for arthralgias. Negative for back pain and myalgias.  Skin:  Negative for rash.  Neurological:  Negative for dizziness and headaches.  Hematological:  Negative for adenopathy. Does not bruise/bleed easily.  Psychiatric/Behavioral:  Negative for depression and sleep disturbance. The patient is not nervous/anxious.      VITALS:  Blood pressure 133/63, pulse 76, temperature 98.4 F (36.9 C), temperature source Oral, resp. rate 18, height 5' 5.9" (1.674 m), weight 125 lb 8 oz  (56.9 kg), SpO2 97 %.  Wt Readings from Last 3 Encounters:  05/12/22 125 lb 8 oz (56.9 kg)  05/04/22 126 lb 4 oz (57.3 kg)  04/27/22 129 lb 12 oz (58.9 kg)    Body mass index is 20.32 kg/m.  Performance status (ECOG): 2 - Symptomatic, <50% confined to bed  PHYSICAL EXAM:  Physical Exam Vitals and nursing note reviewed.  Constitutional:      General: She is not in acute distress.    Appearance: Normal appearance.  HENT:     Head: Normocephalic and atraumatic.     Mouth/Throat:     Mouth: Mucous membranes are moist.     Pharynx: Oropharynx is clear. No oropharyngeal exudate or posterior oropharyngeal erythema.  Eyes:     General: No scleral icterus.    Extraocular Movements: Extraocular movements intact.     Conjunctiva/sclera: Conjunctivae normal.     Pupils: Pupils are equal, round, and reactive to light.  Cardiovascular:     Rate and Rhythm: Normal rate and regular rhythm.     Heart sounds: Normal heart sounds. No murmur heard.    No friction rub. No gallop.  Pulmonary:     Effort: Pulmonary effort is normal.     Breath sounds: Examination of the right-upper field reveals decreased breath sounds. Examination of the left-upper field reveals decreased breath sounds. Decreased breath sounds present. No wheezing, rhonchi or rales.  Abdominal:     General: There is no distension.     Palpations: Abdomen is soft. There is no hepatomegaly, splenomegaly or mass.     Tenderness: There is no abdominal tenderness.  Musculoskeletal:        General: Normal range of motion.     Cervical back: Normal range of motion and neck supple. No tenderness.     Right lower leg: No edema.     Left lower leg: No edema.  Lymphadenopathy:     Cervical: No cervical adenopathy.     Upper Body:     Right upper body: No supraclavicular or axillary adenopathy.     Left upper body: No supraclavicular or axillary adenopathy.  Skin:    General: Skin is warm and dry.     Coloration: Skin is not  jaundiced.     Findings: No rash.  Neurological:     Mental Status: She is alert and oriented to person, place, and time.     Cranial Nerves: No cranial nerve deficit.  Psychiatric:        Mood and Affect: Mood normal.        Behavior: Behavior normal.  Thought Content: Thought content normal.     LABS:      Latest Ref Rng & Units 05/12/2022    1:11 PM 04/24/2022   12:00 AM 03/17/2022   12:00 AM  CBC  WBC 4.0 - 10.5 K/uL 6.1  13.4     11.2      Hemoglobin 12.0 - 15.0 g/dL 10.8  9.3     9.5      Hematocrit 36.0 - 46.0 % 36.8  31     30       Platelets 150 - 400 K/uL 205  362     227         This result is from an external source.      Latest Ref Rng & Units 05/12/2022    1:11 PM 04/24/2022   12:00 AM 03/17/2022   12:00 AM  CMP  Glucose 70 - 99 mg/dL 186     BUN 8 - 23 mg/dL 13  22     17       Creatinine 0.44 - 1.00 mg/dL 0.91  0.8     0.9      Sodium 135 - 145 mmol/L 140  137     138      Potassium 3.5 - 5.1 mmol/L 3.7  4.0     3.8      Chloride 98 - 111 mmol/L 106  104     106      CO2 22 - 32 mmol/L 26  28     23       Calcium 8.9 - 10.3 mg/dL 9.2  9.1     9.4      Total Protein 6.5 - 8.1 g/dL 7.1     Total Bilirubin 0.3 - 1.2 mg/dL 0.5     Alkaline Phos 38 - 126 U/L 82  102     113      AST 15 - 41 U/L 17  28     20       ALT 0 - 44 U/L 13  19     17          This result is from an external source.     Lab Results  Component Value Date   CEA1 1.2 02/04/2021   /  CEA  Date Value Ref Range Status  02/04/2021 1.2 0.0 - 4.7 ng/mL Final    Comment:    (NOTE)                             Nonsmokers          <3.9                             Smokers             <5.6 [employer] Electrochemiluminescence Immunoassay (ECLIA) Values obtained with different assay methods or kits cannot be used interchangeably.  Results cannot be interpreted as absolute evidence of the presence or absence of malignant disease. Performed At: [facility] [place], [place] [id number] Rush Farmer MD HO:[id number]    No results found for: "PSA1" No results found for: "CAN199" No results found for: "CAN125"  No results found for: "TOTALPROTELP", "ALBUMINELP", "A1GS", "A2GS", "BETS", "BETA2SER", "GAMS", "MSPIKE", "SPEI" Lab Results  Component Value Date   TIBC 419 02/24/2022   TIBC 364 02/10/2021   FERRITIN 9 (L) 02/24/2022  FERRITIN 22 02/10/2021   IRONPCTSAT 10 (L) 02/24/2022   IRONPCTSAT 17 02/10/2021   No results found for: "LDH"  STUDIES:  No results found.    HISTORY:   Past Medical History:  Diagnosis Date   Arthritis    Asthma    CAD (coronary artery disease) 09/09/2010   S/p multiple PCIs to RCA, LAD, LCx // s/p inf STEMI 4/18 >> POBA to mRCA followed by CABG (L-LAD, S-D1, S-OM, S-PDA) // Intraoperative TEE 4/18: EF 50-55, no RWMA   Constipation 04/18/2021   Diabetes mellitus    Diabetic nephropathy ([facility])    stage II   Diabetic neuropathy ([facility])    GERD (gastroesophageal reflux disease)    History of MI (myocardial infarction) 05/2002   Hyperlipidemia 09/09/2010       Hypertension    Iron deficiency anemia due to chronic blood loss 03/17/2022   Lung cancer ([place])    with brain metastases   Persistent atrial fibrillation ([place]) 10/01/2016   Post op AFib after CABG 4/18 // Amiodarone and Coumadin started // Amiodarone stopped 11/2016   Shoulder pain, right 04/18/2021   Stroke ([place])    Weight loss 05/18/2021    Past Surgical History:  Procedure Laterality Date   CHOLECYSTECTOMY     CORONARY ARTERY BYPASS GRAFT N/A 10/06/2016   Procedure: CORONARY ARTERY BYPASS GRAFTING (CABG) x four, using internal mammary, and bilateral saphenous veins harvested endoscopically;  Surgeon: Melrose Nakayama, MD;  Location: [facility];  Service: Open Heart Surgery;  Laterality: N/A;   CORONARY BALLOON ANGIOPLASTY N/A 10/01/2016   Procedure: Coronary Balloon Angioplasty;  Surgeon: Belva Crome, MD;  Location: [facility];   Service: Cardiovascular;  Laterality: N/A;   LEFT HEART CATH AND CORONARY ANGIOGRAPHY N/A 10/01/2016   Procedure: Left Heart Cath and Coronary Angiography;  Surgeon: Belva Crome, MD;  Location: [facility];  Service: Cardiovascular;  Laterality: N/A;   TEE WITHOUT CARDIOVERSION N/A 10/06/2016   Procedure: TRANSESOPHAGEAL ECHOCARDIOGRAM (TEE);  Surgeon: Melrose Nakayama, MD;  Location: [facility];  Service: Open Heart Surgery;  Laterality: N/A;    Family History  Problem Relation Age of Onset   Tuberculosis Mother    Cancer Father 32       unknown type   Leukemia Sister    Tuberculosis Sister    Cancer Sister        liver   Cancer Brother 18   Cancer Brother    Heart attack Brother 22   Cancer Brother     Social History:  reports that she has been smoking cigarettes. She has a 60.00 pack-year smoking history. She has never used smokeless tobacco. She reports that she does not drink alcohol and does not use drugs.The patient is accompanied by her daughter today.  Allergies:  Allergies  Allergen Reactions   Ergocalciferol Nausea And Vomiting    Current Medications: Current Outpatient Medications  Medication Sig Dispense Refill   albuterol (VENTOLIN HFA) 108 (90 Base) MCG/ACT inhaler Inhale 2 puffs into the lungs every 4 (four) hours as needed.     ALPRAZolam (XANAX) 0.5 MG tablet Take 1 tablet (0.5 mg total) by mouth 3 (three) times daily as needed for anxiety. 90 tablet 0   atorvastatin (LIPITOR) 80 MG tablet Take 80 mg by mouth daily.     ATROVENT HFA 17 MCG/ACT inhaler Inhale into the lungs.     benzonatate (TESSALON) 100 MG capsule Take 100 mg by mouth 3 (three) times daily.  gabapentin (NEURONTIN) 100 MG capsule Take by mouth.     glipiZIDE (GLUCOTROL XL) 5 MG 24 hr tablet Take 5 mg by mouth daily.     HUMULIN R 100 UNIT/ML injection      JANUVIA 100 MG tablet Take 100 mg by mouth daily.     losartan (COZAAR) 25 MG tablet Take 25 mg by mouth daily.     metoprolol  tartrate (LOPRESSOR) 25 MG tablet Take 25 mg by mouth 2 (two) times daily.     mupirocin cream (BACTROBAN) 2 % Apply topically.     ondansetron (ZOFRAN) 4 MG tablet Take 1 tablet (4 mg total) by mouth every 4 (four) hours as needed for nausea. 90 tablet 3   Oxycodone HCl 10 MG TABS Take 1 tablet (10 mg total) by mouth every 6 (six) hours as needed (for severe pain). 60 tablet 0   pantoprazole (PROTONIX) 40 MG tablet Take 1 tablet (40 mg total) by mouth 2 (two) times daily. 60 tablet 5   prochlorperazine (COMPAZINE) 10 MG tablet Take 1 tablet (10 mg total) by mouth every 6 (six) hours as needed for nausea or vomiting. 90 tablet 3   rivaroxaban (XARELTO) 20 MG TABS tablet Take 20 mg by mouth daily with supper.     SIMBRINZA 1-0.2 % SUSP Place 1 drop into the right eye 2 (two) times daily.     sulfamethoxazole-trimethoprim (BACTRIM DS) 800-160 MG tablet Take 1 tablet by mouth 2 (two) times daily. (Patient taking differently: Take 1 tablet by mouth 2 (two) times daily. Still taking has only been taking once daily) 20 tablet 0   No current facility-administered medications for this visit.

## 2022-05-11 NOTE — Assessment & Plan Note (Addendum)
Stage IV lung cancer diagnosed in April 2022.  She received 1 dose of chemotherapy in June 2022, with combination cisplatin/paclitaxel/pembrolizumab, and tolerated this very poorly.  She ended up in the hospital twice.  Since October 2022, she has been receiving palliative pembrolizumab and has tolerated this well.  She took a break and went to [place] for a time and so she missed some doses.  Also, while she was away, she fell and had a left hip fracture requiring surgery.  She was seen in May with a CT chest and MRI head prior to resuming pembrolizumab after her break.  Her disease appeared stable, so we continued with pembrolizumab every 3 weeks.   CT chest on September 21st revealed a slight increase in the right lower lobe nodule measuring 2.4 x 2.2 x 2 cm previously 1.9 x 1.6 cm.  Scattered pulmonary nodules, the largest measuring 7 mm, are stable.  As her disease was not growing rapidly, we recommended continuing  palliative pembrolizumab, as she did not tolerate chemotherapy previously.  She then fell and sustained a right hip fracture requiring surgery.  Pembrolizumab was resumed 3 weeks ago.  She has had increased general weakness and fatigue since that time, but also received IV Feraheme.  She feels the [name] is what caused her those symptoms.  Unfortunately, it is most likely her immunotherapy.  When I explained this and recommended we delay the treatment, she became upset, stating that she feels there is nothing we can do for her if the treatment is causing the fatigue and weakness.  She asked how many more treatments were planned.  I told her I had planned to give her 3 more treatments prior to repeating a CT scan to see where we stand with her cancer.  She did not want to delay or discontinue her pembrolizumab treatment.  She will proceed with a 14th cycle of pembrolizumab next week.  We will plan to see her back in 3 weeks for repeat clinical assessment prior to 15th cycle.  We may wish to  repeat a CT chest after the 15th cycle.

## 2022-05-12 ENCOUNTER — Inpatient Hospital Stay: Payer: Medicare Other

## 2022-05-12 ENCOUNTER — Encounter: Payer: Self-pay | Admitting: Hematology and Oncology

## 2022-05-12 ENCOUNTER — Inpatient Hospital Stay (INDEPENDENT_AMBULATORY_CARE_PROVIDER_SITE_OTHER): Payer: Medicare Other | Admitting: Hematology and Oncology

## 2022-05-12 VITALS — BP 133/63 | HR 76 | Temp 98.4°F | Resp 18 | Ht 65.9 in | Wt 125.5 lb

## 2022-05-12 DIAGNOSIS — F17210 Nicotine dependence, cigarettes, uncomplicated: Secondary | ICD-10-CM | POA: Diagnosis not present

## 2022-05-12 DIAGNOSIS — E119 Type 2 diabetes mellitus without complications: Secondary | ICD-10-CM | POA: Diagnosis not present

## 2022-05-12 DIAGNOSIS — Z800 Family history of malignant neoplasm of digestive organs: Secondary | ICD-10-CM | POA: Diagnosis not present

## 2022-05-12 DIAGNOSIS — C7931 Secondary malignant neoplasm of brain: Secondary | ICD-10-CM | POA: Diagnosis not present

## 2022-05-12 DIAGNOSIS — D500 Iron deficiency anemia secondary to blood loss (chronic): Secondary | ICD-10-CM | POA: Diagnosis not present

## 2022-05-12 DIAGNOSIS — I10 Essential (primary) hypertension: Secondary | ICD-10-CM | POA: Diagnosis not present

## 2022-05-12 DIAGNOSIS — C3431 Malignant neoplasm of lower lobe, right bronchus or lung: Secondary | ICD-10-CM | POA: Diagnosis not present

## 2022-05-12 DIAGNOSIS — Z806 Family history of leukemia: Secondary | ICD-10-CM

## 2022-05-12 DIAGNOSIS — D72829 Elevated white blood cell count, unspecified: Secondary | ICD-10-CM | POA: Diagnosis not present

## 2022-05-12 DIAGNOSIS — Z23 Encounter for immunization: Secondary | ICD-10-CM | POA: Diagnosis not present

## 2022-05-12 DIAGNOSIS — Z5112 Encounter for antineoplastic immunotherapy: Secondary | ICD-10-CM | POA: Diagnosis not present

## 2022-05-12 LAB — CBC WITH DIFFERENTIAL (CANCER CENTER ONLY)
Abs Immature Granulocytes: 0.02 10*3/uL (ref 0.00–0.07)
Basophils Absolute: 0 10*3/uL (ref 0.0–0.1)
Basophils Relative: 1 %
Eosinophils Absolute: 0.2 10*3/uL (ref 0.0–0.5)
Eosinophils Relative: 3 %
HCT: 36.8 % (ref 36.0–46.0)
Hemoglobin: 10.8 g/dL — ABNORMAL LOW (ref 12.0–15.0)
Immature Granulocytes: 0 %
Lymphocytes Relative: 33 %
Lymphs Abs: 2 10*3/uL (ref 0.7–4.0)
MCH: 26.9 pg (ref 26.0–34.0)
MCHC: 29.3 g/dL — ABNORMAL LOW (ref 30.0–36.0)
MCV: 91.5 fL (ref 80.0–100.0)
Monocytes Absolute: 0.5 10*3/uL (ref 0.1–1.0)
Monocytes Relative: 7 %
Neutro Abs: 3.4 10*3/uL (ref 1.7–7.7)
Neutrophils Relative %: 56 %
Platelet Count: 205 10*3/uL (ref 150–400)
RBC: 4.02 MIL/uL (ref 3.87–5.11)
RDW: 21.7 % — ABNORMAL HIGH (ref 11.5–15.5)
WBC Count: 6.1 10*3/uL (ref 4.0–10.5)
nRBC: 0 % (ref 0.0–0.2)

## 2022-05-12 LAB — CMP (CANCER CENTER ONLY)
ALT: 13 U/L (ref 0–44)
AST: 17 U/L (ref 15–41)
Albumin: 4 g/dL (ref 3.5–5.0)
Alkaline Phosphatase: 82 U/L (ref 38–126)
Anion gap: 8 (ref 5–15)
BUN: 13 mg/dL (ref 8–23)
CO2: 26 mmol/L (ref 22–32)
Calcium: 9.2 mg/dL (ref 8.9–10.3)
Chloride: 106 mmol/L (ref 98–111)
Creatinine: 0.91 mg/dL (ref 0.44–1.00)
GFR, Estimated: >60 mL/min (ref >60)
Glucose, Bld: 186 mg/dL — ABNORMAL HIGH (ref 70–99)
Potassium: 3.7 mmol/L (ref 3.5–5.1)
Sodium: 140 mmol/L (ref 135–145)
Total Bilirubin: 0.5 mg/dL (ref 0.3–1.2)
Total Protein: 7.1 g/dL (ref 6.5–8.1)

## 2022-05-12 NOTE — Assessment & Plan Note (Signed)
The patient developed microcytic anemia and was found to be iron deficient, felt to be due to GI blood loss.  We advised her to increase her pantoprazole 40 mg to twice daily.  At her visit in September, she had not started oral iron, so I recommended she receive IV iron in the form of Feraheme.  She continued to report dark stool, but due to her multiple medical comorbidities, we decided to hold off on GI referral.  Due to her fall and right hip fracture, she ha not received IV iron yet, so Feraheme was given earlier this month.

## 2022-05-12 NOTE — Assessment & Plan Note (Addendum)
Brain metastases diagnosed in April 2022.  This presented with right sided head pain and pain of the right eye.  She received stereotactic radiation to the two brain lesions. MRI brain in September revealed four metastatic deposits in the brain. Several show mild associated hemorrhage. Mild edema. No mass-effect or midline shift.  She was placed on immunotherapy with pembrolizumab every 3 weeks in October.  MRI brain in January revealed a mixed response with one new enhancing lesion in the right cerebellar hemisphere measuring 3 mm. One lesion in the left frontal lobe has resolved, one lesion in the right occipital lobe is stable, and two lesions were mildly decreased in size.    MRI brain in May revealed resolution of the right cerebellar lesion and decreased size of the right occipital and bilateral frontal lesions.  MRI brain on September 19th revealed stable previously noted right frontal, right occipital, and left frontal lesions, all measuring less than 4 mm. No new enhancing lesions were seen.

## 2022-05-15 ENCOUNTER — Encounter: Payer: Self-pay | Admitting: Oncology

## 2022-05-15 ENCOUNTER — Telehealth: Payer: Self-pay

## 2022-05-15 ENCOUNTER — Encounter: Payer: Self-pay | Admitting: Hematology and Oncology

## 2022-05-15 MED FILL — Pembrolizumab IV Soln 100 MG/4ML (25 MG/ML): INTRAVENOUS | Qty: 8 | Status: AC

## 2022-05-15 NOTE — Telephone Encounter (Signed)
Patients daughter Loletha Carrow  aware and voiced understanding.

## 2022-05-15 NOTE — Telephone Encounter (Signed)
-----   Message from [name], PA-C sent at 05/12/2022  5:14 PM EST ----- Please let her daughter know labs ok for treatment Tues. Thanks

## 2022-05-16 ENCOUNTER — Inpatient Hospital Stay: Payer: Medicare Other

## 2022-05-16 VITALS — BP 129/71 | HR 74 | Temp 98.0°F | Resp 18 | Wt 126.0 lb

## 2022-05-16 DIAGNOSIS — D500 Iron deficiency anemia secondary to blood loss (chronic): Secondary | ICD-10-CM | POA: Diagnosis not present

## 2022-05-16 DIAGNOSIS — C3431 Malignant neoplasm of lower lobe, right bronchus or lung: Secondary | ICD-10-CM | POA: Diagnosis not present

## 2022-05-16 DIAGNOSIS — C7931 Secondary malignant neoplasm of brain: Secondary | ICD-10-CM | POA: Diagnosis not present

## 2022-05-16 DIAGNOSIS — Z5112 Encounter for antineoplastic immunotherapy: Secondary | ICD-10-CM | POA: Diagnosis not present

## 2022-05-16 DIAGNOSIS — D72829 Elevated white blood cell count, unspecified: Secondary | ICD-10-CM | POA: Diagnosis not present

## 2022-05-16 DIAGNOSIS — Z23 Encounter for immunization: Secondary | ICD-10-CM | POA: Diagnosis not present

## 2022-05-16 MED ORDER — SODIUM CHLORIDE 0.9 % IV SOLN
200.0000 mg | Freq: Once | INTRAVENOUS | Status: AC
  Administered 2022-05-16: 200 mg via INTRAVENOUS
  Filled 2022-05-16: qty 8

## 2022-05-16 MED ORDER — SODIUM CHLORIDE 0.9 % IV SOLN: Freq: Once | INTRAVENOUS | Status: AC

## 2022-05-16 MED ORDER — HEPARIN SOD (PORK) LOCK FLUSH 100 UNIT/ML IV SOLN
500.0000 [IU] | Freq: Once | INTRAVENOUS | Status: AC | PRN
  Administered 2022-05-16: 500 [IU]

## 2022-05-16 MED ORDER — SODIUM CHLORIDE 0.9% FLUSH
10.0000 mL | INTRAVENOUS | Status: DC | PRN
  Administered 2022-05-16: 10 mL

## 2022-05-16 NOTE — Patient Instructions (Signed)

## 2022-05-17 ENCOUNTER — Other Ambulatory Visit: Payer: Self-pay

## 2022-05-17 DIAGNOSIS — C3431 Malignant neoplasm of lower lobe, right bronchus or lung: Secondary | ICD-10-CM

## 2022-05-22 ENCOUNTER — Ambulatory Visit: Payer: Medicare Other | Admitting: Internal Medicine

## 2022-05-25 ENCOUNTER — Other Ambulatory Visit: Payer: Self-pay

## 2022-05-25 DIAGNOSIS — C3431 Malignant neoplasm of lower lobe, right bronchus or lung: Secondary | ICD-10-CM

## 2022-05-25 DIAGNOSIS — C7931 Secondary malignant neoplasm of brain: Secondary | ICD-10-CM

## 2022-05-25 MED ORDER — OXYCODONE HCL 10 MG PO TABS: 10.0000 mg | ORAL_TABLET | Freq: Four times a day (QID) | ORAL | 0 refills | Status: DC | PRN

## 2022-05-26 ENCOUNTER — Encounter: Payer: Self-pay | Admitting: Oncology

## 2022-05-26 ENCOUNTER — Ambulatory Visit (INDEPENDENT_AMBULATORY_CARE_PROVIDER_SITE_OTHER): Payer: Medicare Other | Admitting: Internal Medicine

## 2022-05-26 ENCOUNTER — Encounter: Payer: Self-pay | Admitting: Hematology and Oncology

## 2022-05-26 ENCOUNTER — Encounter: Payer: Self-pay | Admitting: Internal Medicine

## 2022-05-26 VITALS — BP 112/72 | HR 55 | Temp 98.0°F | Resp 20 | Ht 67.0 in | Wt 126.7 lb

## 2022-05-26 DIAGNOSIS — C3431 Malignant neoplasm of lower lobe, right bronchus or lung: Secondary | ICD-10-CM | POA: Diagnosis not present

## 2022-05-26 DIAGNOSIS — I10 Essential (primary) hypertension: Secondary | ICD-10-CM

## 2022-05-26 DIAGNOSIS — E1121 Type 2 diabetes mellitus with diabetic nephropathy: Secondary | ICD-10-CM | POA: Diagnosis not present

## 2022-05-26 DIAGNOSIS — E1142 Type 2 diabetes mellitus with diabetic polyneuropathy: Secondary | ICD-10-CM

## 2022-05-26 DIAGNOSIS — N182 Chronic kidney disease, stage 2 (mild): Secondary | ICD-10-CM | POA: Insufficient documentation

## 2022-05-26 NOTE — Assessment & Plan Note (Signed)
I reviewed her notes from Oncology.  She will continue on her immunotherapy and I reviewed her most recent labs.

## 2022-05-26 NOTE — Assessment & Plan Note (Signed)
Her creatnine has been stable.  We will do urine studies today.  We will check a HgBA1c as well.

## 2022-05-26 NOTE — Assessment & Plan Note (Signed)
We will cotninue with BP and diabetes control.

## 2022-05-26 NOTE — Assessment & Plan Note (Signed)
She is to continue on her gabapentin for her neuropathy.  We did a diabetic foot exam today and she cannot feel anything on the right.  I had a discussion about foot care and not walking around barefooted.

## 2022-05-26 NOTE — Assessment & Plan Note (Signed)
Her BP is controlled.  We will continue on her current meds.

## 2022-05-26 NOTE — Progress Notes (Signed)
Office Visit  Subjective   Patient ID: [name]   DOB: December 05, 1945   Age: 76 y.o.   MRN: [id number]   Chief Complaint Chief Complaint  Patient presents with   Follow-up    Diabetes     History of Present Illness The patient is a 76 yo female who returns today for regular followup where I saw her 1 month ago where she presented for a hospital followup due to a right hip fracture.  Again, she presented to [facility] ER on 03/21/2022 where she suffered a mechanical fall.  She tripped over a root where she landed on her right hip.  A plain film xray was obtained that showed a right hip fracture with a right femoral neck fracture with mild angulation and displacement.  She underwent a right hemiarthoplasty on 03/22/2022 without problems.  Her hospital course was complicated by her going into A. Fib with RVR where she was started on an amiodarone drip.  She did convert back to sinus rhythm and they transitioned her to oral amiodarone.  She did have some postoperative anemia where she was transfused 1 Unit of blood and her hemoglobin remained stable thereafter.  The patient was discharged to [facility] for short term rehab where she stayed there for 2 weeks.  Her daughter states she did not have any problems at [facility].  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 Anabel [name] at the [facility] but she has not had this therapy for a month.  I asked them at their last visit to re-contact the cancer center which they did and she has seen Raven Sack, PA with Dr. Anabel [name] and was restarted back on her iv infusions where she is getting pembrolizumab every 3 weeks.  She had homehealth therapy coming to the home but she completed this therapy.  She is now using a 1 point cane and has not had any falls since her last visit.  She states she is ambulating well.   The patient is a 76 year old Caucasian/White female who returns for a follow-up visit for her T2 diabetes. Since the last visit, there  have been no problems. She remains Glipizide ER 5mg  daily and Januvia 100mg  daily. She was also on Humulin R insulin which she can take 3 times a day with sliding scale where she takes 6-7 units at a time but again stopped this since her hospitalization.  She specifically denies unexplained abdominal pain, nausea or vomiting and documented hypoglycemia. She states she does not check her blood sugars anymore.  Her last HgBa1c was done in 11/2021 and was 6.3%.   She came in fasting today in anticipation of lab work. She does have a history of diabetic neuropathy as well as diabetic nephropathy where her daughter has told me she has stage II CKD. She states that she has bilateral foot pain on the bottom and top of both feet. She was started on gabapentin 100mg  BID which she states does help. There is no diabetic retinopathy. She has cardiovascular disease with a MI in the past associated with her diabetes.  She had a yearly diabetic eye exam in 10/2021 but I do not have that report.  The patient is a 76 year old Caucasian/White female who presents for a follow-up evaluation of hypertension.  This past year, she noted on her automatic BP cuff that her heart rate was running in the 50's.  She was not having any symptoms from this  but I cut back her metoprolol from 50mg  BID to 25mg  BID.  She receives her medications in bubble packs at this time.  The patient has not been checking her blood pressure at home recently.  The patient's current medications include: metoprolol 25mg  po BID and losartan 25mg  daily. The patient has been tolerating her medications well. The patient denies any headache, visual changes, dizziness, lightheadness, chest pain, shortness of breath, weakness/numbness, and edema.  She reports there have been no other symptoms noted.   [name] has a history of bronchogenic carcinoma which was diagnosed in 10/2020.  Her lung cancer first manifested as trouble in her right eye with pain.  MRI imaging was  done in 09/2020 which revealed a hypeintense T1 signal lesions within the right cerebrum that was consistent with hemorrhagic metastases.  One lesion was in the right occipital lobe and one in the posterior right frontal lobe.  They also noted multiple lacunar infarctions of the basal ganglia.  A PET scan was performed in 10/2020 that confirmed a hypermetabolic nodule in the right LL consistent with bronchogenic carcinoma.  There was no evidence of other metastatic disease was observed to brain.  She received 1 treatment of chemotherapy and stereotactic radiation to the 2 brain lesions.  This was completed in 11/2020.  She did have to struggle with chemotherapy and was hospitalized twice due to severe weakness.  She declined to pursue further chemotherapy and moved from [place] to [place] in 12/2020 to live with her daughter.  I did see her and referred her to Dr. Hinton [name] whom she saw on 03/11/2021.  She had a CT scan of her chest on 03/09/2021 which showed a right LL nodule measuring 2.0 x 1.9 cm representing the site of her known cancer.  There were findings suspicious for right infrahilar nodal involvement.  There were smaller nodular foci in the right LL suspicious for multifocal involvement.  There was also a 13 mm left adrenal nodule that was also noted from scans in 2018.  She also was noted to have a prior CABG and aortic atherosclerosis. They also did a repeat MRI of her brain on 03/21/2021 but I do not have those results.  Dr. 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  She was started on palliative pembrolizumab in October and started her 7th cycle around 08/2021.  She sees oncology every 3 weeks for this immunotherapy She has repeat CT scan of the chest and a MRI of her brain and this has showed a stable appearance this past summer.  Again she is getting pembrolizumab every 3 weeks at this time.            Past Medical History Past Medical History:  Diagnosis  Date   Arthritis    Asthma    CAD (coronary artery disease) 09/09/2010   S/p multiple PCIs to RCA, LAD, LCx // s/p inf STEMI 4/18 >> POBA to mRCA followed by CABG (L-LAD, S-D1, S-OM, S-PDA) // Intraoperative TEE 4/18: EF 50-55, no RWMA   Constipation 04/18/2021   Diabetes mellitus    Diabetic nephropathy (HCC)    stage II   Diabetic neuropathy (HCC)    GERD (gastroesophageal reflux disease)    History of MI (myocardial infarction) 05/2002   Hyperlipidemia 09/09/2010       Hypertension    Iron deficiency anemia due to chronic blood loss 03/17/2022   Lung cancer ([place])    with brain metastases  Persistent atrial fibrillation ([place]) 10/01/2016   Post op AFib after CABG 4/18 // Amiodarone and Coumadin started // Amiodarone stopped 11/2016   Shoulder pain, right 04/18/2021   Stroke ([place])    Weight loss 05/18/2021     Allergies Allergies  Allergen Reactions   Ergocalciferol Nausea And Vomiting     Review of Systems Review of Systems  Constitutional:  Negative for chills, fever and weight loss.  Eyes:  Negative for blurred vision and double vision.  Respiratory:  Negative for cough and shortness of breath.   Cardiovascular:  Negative for chest pain, palpitations and leg swelling.  Gastrointestinal:  Negative for constipation, diarrhea, nausea and vomiting.  Genitourinary:  Negative for dysuria and frequency.  Musculoskeletal:  Positive for back pain.  Skin:  Negative for itching and rash.  Neurological:  Negative for dizziness, weakness and headaches.  Psychiatric/Behavioral:  Negative for depression. The patient is not nervous/anxious.        Objective:    Vitals Vitals:   05/26/22 1111  BP: 112/72  Pulse: (!) 55  Resp: 20  Temp: 98 F (36.7 C)  SpO2: 97%     Physical Examination Physical Exam Constitutional:      Appearance: Normal appearance.  Cardiovascular:     Rate and Rhythm: Normal rate and regular rhythm.     Pulses: Normal pulses.     Heart sounds:  Normal heart sounds. No murmur heard.    No friction rub. No gallop.  Pulmonary:     Effort: Pulmonary effort is normal. No respiratory distress.     Breath sounds: No wheezing, rhonchi or rales.  Abdominal:     General: Abdomen is flat. Bowel sounds are normal. There is no distension.     Palpations: Abdomen is soft.     Tenderness: There is no abdominal tenderness.  Musculoskeletal:     Right lower leg: No edema.     Left lower leg: No edema.  Skin:    General: Skin is warm and dry.     Findings: No rash.  Neurological:     General: No focal deficit present.     Mental Status: She is alert and oriented to person, place, and time.  Psychiatric:        Mood and Affect: Mood normal.        Behavior: Behavior normal.        Assessment & Plan:   Essential hypertension Her BP is controlled.  We will continue on her current meds.  Lung cancer, lower lobe [facility]) I reviewed her notes from Oncology.  She will continue on her immunotherapy and I reviewed her most recent labs.  Diabetic polyneuropathy associated with type 2 diabetes mellitus ([place]) She is to continue on her gabapentin for her neuropathy.  We did a diabetic foot exam today and she cannot feel anything on the right.  I had a discussion about foot care and not walking around barefooted.  Diabetic nephropathy associated with type 2 diabetes mellitus ([place]) Her creatnine has been stable.  We will do urine studies today.  We will check a HgBA1c as well.  Stage 2 chronic kidney disease We will cotninue with BP and diabetes control.    Return in about 3 months (around 08/25/2022) for annual.    Townsend Roger, MD

## 2022-05-27 ENCOUNTER — Other Ambulatory Visit: Payer: Self-pay

## 2022-05-28 LAB — HEMOGLOBIN A1C
Est. average glucose Bld gHb Est-mCnc: 120 mg/dL
Hgb A1c MFr Bld: 5.8 % — ABNORMAL HIGH (ref 4.8–5.6)

## 2022-05-28 LAB — MICROALBUMIN / CREATININE URINE RATIO
Creatinine, Urine: 37.3 mg/dL
Microalb/Creat Ratio: 9 mg/g creat (ref 0–29)
Microalbumin, Urine: 3.4 ug/mL

## 2022-05-29 ENCOUNTER — Encounter: Payer: Self-pay | Admitting: Oncology

## 2022-06-01 NOTE — Progress Notes (Signed)
Patient Care Team: Townsend Roger, MD as PCP - General (Internal Medicine) Sharmon Revere as Physician Assistant (Cardiology) Derwood Kaplan, MD as Consulting Physician (Oncology)  Clinic Day: 06/08/22   Referring physician: Townsend Roger, MD  ASSESSMENT & PLAN:   Assessment & Plan: Lung cancer, lower lobe ([place]) Stage IV lung cancer diagnosed in April 2022.  She received 1 dose of chemotherapy in June 2022, with combination cisplatin/paclitaxel/pembrolizumab, and tolerated this very poorly.  She ended up in the hospital twice.  Since October 2022, she has been receiving palliative pembrolizumab and has tolerated this well.  She took a break and went to [place] for a time and so she missed some doses.  Also, while she was away, she fell and had a left hip fracture requiring surgery.  She was seen in May with a CT chest and MRI head prior to resuming pembrolizumab after her break.  Her disease appeared stable, so she has continued with pembrolizumab every 3 weeks. Today, she complains of weakness and fatigue.    Malignant neoplasm metastatic to brain [facility]) Brain metastases diagnosed in April 2022.  This presented with right sided head pain and pain of the right eye.  She received stereotactic radiation to the two brain lesions. MRI brain in September revealed four metastatic deposits in the brain. Several show mild associated hemorrhage. Mild edema. No mass-effect or midline shift.  She was placed on immunotherapy with pembrolizumab every 3 weeks in October.  MRI brain in January revealed a mixed response with one new enhancing lesion in the right cerebellar hemisphere measuring 3 mm. One lesion in the left frontal lobe has resolved, one lesion in the right occipital lobe is stable, and two lesions were mildly decreased in size.    MRI brain in May revealed resolution of the right cerebellar lesion and decreased size of the right occipital and bilateral frontal lesions.However she is  now having neurologic symptoms, and I am concerned about progression.   Iron deficiency anemia I suspect she is bleeding and has severe iron deficiency. We will start on oral supplements but consider IV iron if she is unable to tolerate it. She does complain of discomfort of her epigastric area, but I dont think I can get a GI evaluation immediately so will increase her Protonix.    Plan: She will continue with treatment that is scheduled for 06/12/22. We will plan to schedule an MRI and CT of chest next week and follow up the following week to discuss these results. I will bring her back in 2 weeks after these tests are done along with CBC, CMP, Iron, TIBC, and Ferritin. We can then make decisions on treatment moving forward after these results. The patient understands the plans discussed today and is in agreement with them.  She knows to contact our office if she develops concerns prior to her next appointment.   Derwood Kaplan, MD  [facility] [address] [place] [address] Dept: [phone] Dept Fax: [phone]   No orders of the defined types were placed in this encounter.     CHIEF COMPLAINT:  CC: A 76 year old female with history of lung cancer here for 3 week evaluation  Current Treatment:  Pembrolizumab  INTERVAL HISTORY:  [name] is here today for repeat clinical assessment. She states she has days where she feels good and days that she's in pain. She states she has pain to the right  of the spine medial to the right scapula. She also notes some shortness of breath. The last time she had scans was in September when she was in the emergency room. We will schedule to get MRI and CT of the chest sometime next week. She will continue with treatment that is scheduled for 06/12/22. She denies fevers or chills. She denies pain. Her appetite is good. Her weight has been stable.  I have reviewed the past medical  history, past surgical history, social history and family history with the patient and they are unchanged from previous note.  ALLERGIES:  is allergic to ergocalciferol.  MEDICATIONS:  Current Outpatient Medications  Medication Sig Dispense Refill  . albuterol (VENTOLIN HFA) 108 (90 Base) MCG/ACT inhaler Inhale 2 puffs into the lungs every 4 (four) hours as needed.    . ALPRAZolam (XANAX) 0.5 MG tablet Take 1 tablet (0.5 mg total) by mouth 3 (three) times daily as needed for anxiety. 90 tablet 0  . atorvastatin (LIPITOR) 80 MG tablet Take 80 mg by mouth daily.    . ATROVENT HFA 17 MCG/ACT inhaler Inhale into the lungs.    . benzonatate (TESSALON) 100 MG capsule Take 100 mg by mouth 3 (three) times daily.    Marland Kitchen gabapentin (NEURONTIN) 100 MG capsule Take by mouth.    Marland Kitchen glipiZIDE (GLUCOTROL XL) 5 MG 24 hr tablet Take 5 mg by mouth daily.    Marland Kitchen HUMULIN R 100 UNIT/ML injection     . JANUVIA 100 MG tablet Take 100 mg by mouth daily.    Marland Kitchen losartan (COZAAR) 25 MG tablet Take 25 mg by mouth daily.    . metoprolol tartrate (LOPRESSOR) 25 MG tablet Take 25 mg by mouth 2 (two) times daily.    . mupirocin cream (BACTROBAN) 2 % Apply topically.    . ondansetron (ZOFRAN) 4 MG tablet Take 1 tablet (4 mg total) by mouth every 4 (four) hours as needed for nausea. 90 tablet 3  . Oxycodone HCl 10 MG TABS Take 1 tablet (10 mg total) by mouth every 6 (six) hours as needed (for severe pain). 60 tablet 0  . pantoprazole (PROTONIX) 40 MG tablet Take 1 tablet (40 mg total) by mouth 2 (two) times daily. 60 tablet 5  . prochlorperazine (COMPAZINE) 10 MG tablet Take 1 tablet (10 mg total) by mouth every 6 (six) hours as needed for nausea or vomiting. 90 tablet 3  . rivaroxaban (XARELTO) 20 MG TABS tablet Take 1 tablet (20 mg total) by mouth daily with supper. 90 tablet 1  . SIMBRINZA 1-0.2 % SUSP Place 1 drop into the right eye 2 (two) times daily.     No current facility-administered medications for this visit.     HISTORY OF PRESENT ILLNESS:   Oncology History  Lung cancer, lower lobe ([place])  07/07/2020 Initial Diagnosis   Lung cancer, lower lobe ([place])   10/15/2020 Cancer Staging   Staging form: Lung, AJCC 8th Edition - Clinical stage from 10/15/2020: Stage IV (cT1b, cN0, cM1) - Signed by Derwood Kaplan, MD on 03/31/2021 Histopathologic type: Squamous cell carcinoma, NOS Stage prefix: Initial diagnosis Histologic grade (G): G3 Histologic grading system: 4 grade system Laterality: Right Tumor size (mm): 18 Sites of metastasis: Brain Lymph-vascular invasion (LVI): Presence of LVI unknown/indeterminate Diagnostic confirmation: Positive histology Specimen type: Core Needle Biopsy Staged by: Managing physician Type of lung cancer: Locally advanced or metastatic non-small cell lung cancer Stage used in treatment planning: Yes National guidelines used in treatment planning:  Yes Type of national guideline used in treatment planning: NCCN   04/08/2021 - 02/07/2022 Chemotherapy   Patient is on Treatment Plan : LUNG NSCLC flat dose Pembrolizumab Q21D     04/08/2021 -  Chemotherapy   Patient is on Treatment Plan : LUNG NSCLC Pembrolizumab (200) q21d         REVIEW OF SYSTEMS:  Review of Systems  Constitutional:  Positive for malaise/fatigue.  HENT: Negative.    Eyes: Negative.   Respiratory:  Positive for shortness of breath.   Cardiovascular: Negative.   Gastrointestinal: Negative.   Genitourinary: Negative.   Musculoskeletal:  Positive for joint pain.  Skin: Negative.   Neurological:  Positive for focal weakness.  Endo/Heme/Allergies: Negative.   Psychiatric/Behavioral: Negative.      Constitutional: Denies fevers, chills or abnormal weight loss Eyes: Denies blurriness of vision Ears, nose, mouth, throat, and face: Denies mucositis or sore throat Respiratory: Denies cough, dyspnea or wheezes Cardiovascular: Denies palpitation, chest discomfort or lower extremity  swelling Gastrointestinal:  Denies nausea, heartburn or change in bowel habits Skin: Denies abnormal skin rashes Lymphatics: Denies new lymphadenopathy or easy bruising Neurological:Denies numbness, tingling or new weaknesses Behavioral/Psych: Mood is stable, no new changes  All other systems were reviewed with the patient and are negative.   VITALS:  Blood pressure (!) 149/63, pulse (!) 58, temperature 98 F (36.7 C), temperature source Oral, resp. rate 18, height 5\' 7"  (1.702 m), weight 127 lb 9.6 oz (57.9 kg), SpO2 100 %.  Wt Readings from Last 3 Encounters:  06/23/22 124 lb 3.2 oz (56.3 kg)  06/12/22 128 lb 0.6 oz (58.1 kg)  06/08/22 127 lb 9.6 oz (57.9 kg)    Body mass index is 19.98 kg/m.  Performance status (ECOG): 1 - Symptomatic but completely ambulatory  PHYSICAL EXAM:  Physical Exam Constitutional:      General: She is not in acute distress.    Appearance: Normal appearance. She is normal weight.  HENT:     Head: Normocephalic and atraumatic.     Right Ear: Tympanic membrane normal.     Left Ear: Tympanic membrane normal.     Nose: Nose normal.  Eyes:     General: No scleral icterus.    Extraocular Movements: Extraocular movements intact.     Conjunctiva/sclera: Conjunctivae normal.     Pupils: Pupils are equal, round, and reactive to light.  Cardiovascular:     Rate and Rhythm: Normal rate and regular rhythm.     Pulses: Normal pulses.     Heart sounds: Normal heart sounds. No murmur heard.    No gallop.  Pulmonary:     Effort: Pulmonary effort is normal. No respiratory distress.     Breath sounds: Normal breath sounds. No wheezing or rales.  Musculoskeletal:     Cervical back: Normal range of motion and neck supple.  Neurological:     Mental Status: She is alert and oriented to person, place, and time.     Comments: Negative romberg.   Psychiatric:        Mood and Affect: Mood normal.        Behavior: Behavior normal.        Thought Content: Thought  content normal.        Judgment: Judgment normal.    LABORATORY DATA:  I have reviewed the data as listed    Component Value Date/Time   NA 137 06/23/2022 1532   NA 137 04/24/2022 0000   K 3.5 06/23/2022 1532  CL 106 06/23/2022 1532   CO2 22 06/23/2022 1532   GLUCOSE 101 (H) 06/23/2022 1532   BUN 30 (H) 06/23/2022 1532   BUN 22 (A) 04/24/2022 0000   CREATININE 1.22 (H) 06/23/2022 1532   CALCIUM 9.0 06/23/2022 1532   PROT 7.0 06/23/2022 1532   ALBUMIN 4.1 06/23/2022 1532   AST 38 06/23/2022 1532   ALT 28 06/23/2022 1532   ALKPHOS 77 06/23/2022 1532   BILITOT 0.6 06/23/2022 1532   GFRNONAA 46 (L) 06/23/2022 1532   GFRAA 59 (L) 10/20/2016 1618    No results found for: "SPEP", "UPEP"  Lab Results  Component Value Date   WBC 3.9 (L) 06/23/2022   NEUTROABS 1.9 06/23/2022   HGB 11.9 (L) 06/23/2022   HCT 38.4 06/23/2022   MCV 93.7 06/23/2022   PLT 193 06/23/2022      Chemistry      Component Value Date/Time   NA 137 06/23/2022 1532   NA 137 04/24/2022 0000   K 3.5 06/23/2022 1532   CL 106 06/23/2022 1532   CO2 22 06/23/2022 1532   BUN 30 (H) 06/23/2022 1532   BUN 22 (A) 04/24/2022 0000   CREATININE 1.22 (H) 06/23/2022 1532   GLU 268 04/24/2022 0000      Component Value Date/Time   CALCIUM 9.0 06/23/2022 1532   ALKPHOS 77 06/23/2022 1532   AST 38 06/23/2022 1532   ALT 28 06/23/2022 1532   BILITOT 0.6 06/23/2022 1532       RADIOGRAPHIC STUDIES: I have personally reviewed the radiological images as listed and agreed with the findings in the report. No results found. EXAM:03/02/2022 PORTABLE CHEST VIEW 1 IMPRESSION: Patchy airspace opacities in the right mid lower lung correspond to the irregular pulmonary nodules seen on most recent CT chest 11/07/2021, unchanged compared to prior exam.   No new focal consolidation or pleural effusion.  EXAM:11/07/21 MRI HEAD WITHOUT AND WITH CONTRAST IMPRESSION: Findings consistent with treatment response with resolution  of tight cerebella lesion described on prior MRI and decrease in size of right occipital and bilateral frontal lesions, as described above. No new enhancing lesion identified.  EXAM:11/07/21 CT CHEST WITHOUT CONTRAST IMPRESSION: Unchanged spiculated nodule of the superior segment right lower lobe measuring 1.9 x 1.6 cm. Multiple additional generally clustered, irregular nodules of the right lower lobe are likewise unchanged, measuring 0.7 cm and smaller. Background of extensive, fine centrilobular nodularity throughout, concentrated in the lung apices, consistent with smoking-related respiratory bronchiolitis. Coronary artery disease Stable, benign fatty attenuation left adrenal adenoma. This demonstrates internal macroscopic fatty attenuation and has been stable greater than 5 years.        I,Gabriella Ballesteros,acting as a scribe for Dellia Beckwith, MD.,have documented all relevant documentation on the behalf of Dellia Beckwith, MD,as directed by  Dellia Beckwith, MD while in the presence of Dellia Beckwith, MD.

## 2022-06-02 ENCOUNTER — Ambulatory Visit: Payer: Medicare Other | Admitting: Oncology

## 2022-06-02 ENCOUNTER — Other Ambulatory Visit: Payer: Medicare Other

## 2022-06-06 ENCOUNTER — Ambulatory Visit: Payer: Medicare Other

## 2022-06-08 ENCOUNTER — Inpatient Hospital Stay: Payer: Medicare Other | Attending: Oncology

## 2022-06-08 ENCOUNTER — Other Ambulatory Visit: Payer: Self-pay | Admitting: Oncology

## 2022-06-08 ENCOUNTER — Inpatient Hospital Stay (INDEPENDENT_AMBULATORY_CARE_PROVIDER_SITE_OTHER): Payer: Medicare Other | Admitting: Oncology

## 2022-06-08 ENCOUNTER — Encounter: Payer: Self-pay | Admitting: Oncology

## 2022-06-08 VITALS — BP 149/63 | HR 58 | Temp 98.0°F | Resp 18 | Ht 67.0 in | Wt 127.6 lb

## 2022-06-08 DIAGNOSIS — D500 Iron deficiency anemia secondary to blood loss (chronic): Secondary | ICD-10-CM | POA: Insufficient documentation

## 2022-06-08 DIAGNOSIS — C3431 Malignant neoplasm of lower lobe, right bronchus or lung: Secondary | ICD-10-CM

## 2022-06-08 DIAGNOSIS — Z5112 Encounter for antineoplastic immunotherapy: Secondary | ICD-10-CM | POA: Insufficient documentation

## 2022-06-08 DIAGNOSIS — C7931 Secondary malignant neoplasm of brain: Secondary | ICD-10-CM | POA: Insufficient documentation

## 2022-06-08 DIAGNOSIS — D539 Nutritional anemia, unspecified: Secondary | ICD-10-CM

## 2022-06-08 DIAGNOSIS — Z79899 Other long term (current) drug therapy: Secondary | ICD-10-CM | POA: Insufficient documentation

## 2022-06-08 LAB — CBC WITH DIFFERENTIAL (CANCER CENTER ONLY)
Abs Immature Granulocytes: 0.03 10*3/uL (ref 0.00–0.07)
Basophils Absolute: 0.1 10*3/uL (ref 0.0–0.1)
Basophils Relative: 1 %
Eosinophils Absolute: 0.2 10*3/uL (ref 0.0–0.5)
Eosinophils Relative: 2 %
HCT: 35.4 % — ABNORMAL LOW (ref 36.0–46.0)
Hemoglobin: 10.8 g/dL — ABNORMAL LOW (ref 12.0–15.0)
Immature Granulocytes: 0 %
Lymphocytes Relative: 43 %
Lymphs Abs: 3.3 10*3/uL (ref 0.7–4.0)
MCH: 29.1 pg (ref 26.0–34.0)
MCHC: 30.5 g/dL (ref 30.0–36.0)
MCV: 95.4 fL (ref 80.0–100.0)
Monocytes Absolute: 0.5 10*3/uL (ref 0.1–1.0)
Monocytes Relative: 7 %
Neutro Abs: 3.6 10*3/uL (ref 1.7–7.7)
Neutrophils Relative %: 47 %
Platelet Count: 187 10*3/uL (ref 150–400)
RBC: 3.71 MIL/uL — ABNORMAL LOW (ref 3.87–5.11)
RDW: 19.9 % — ABNORMAL HIGH (ref 11.5–15.5)
WBC Count: 7.6 10*3/uL (ref 4.0–10.5)
nRBC: 0 % (ref 0.0–0.2)

## 2022-06-08 LAB — CMP (CANCER CENTER ONLY)
ALT: 13 U/L (ref 0–44)
AST: 17 U/L (ref 15–41)
Albumin: 4.2 g/dL (ref 3.5–5.0)
Alkaline Phosphatase: 69 U/L (ref 38–126)
Anion gap: 7 (ref 5–15)
BUN: 17 mg/dL (ref 8–23)
CO2: 27 mmol/L (ref 22–32)
Calcium: 9.3 mg/dL (ref 8.9–10.3)
Chloride: 110 mmol/L (ref 98–111)
Creatinine: 1.25 mg/dL — ABNORMAL HIGH (ref 0.44–1.00)
GFR, Estimated: 44 mL/min — ABNORMAL LOW (ref >60)
Glucose, Bld: 94 mg/dL (ref 70–99)
Potassium: 4.1 mmol/L (ref 3.5–5.1)
Sodium: 144 mmol/L (ref 135–145)
Total Bilirubin: 0.4 mg/dL (ref 0.3–1.2)
Total Protein: 6.9 g/dL (ref 6.5–8.1)

## 2022-06-09 ENCOUNTER — Telehealth: Payer: Self-pay

## 2022-06-09 ENCOUNTER — Other Ambulatory Visit: Payer: Self-pay | Admitting: Oncology

## 2022-06-09 DIAGNOSIS — C3431 Malignant neoplasm of lower lobe, right bronchus or lung: Secondary | ICD-10-CM

## 2022-06-09 NOTE — Telephone Encounter (Signed)
-----   Message from Derwood Kaplan, MD sent at 06/08/2022  6:44 PM EST ----- Regarding: pain med She had Rx sent 11/30 for #60 of the oxy 10, did she really go through all those in 2 weeks?

## 2022-06-09 NOTE — Telephone Encounter (Signed)
Talked with Vickie patients daughter. She said she will check with [facility] to see if she still has any Oxycodone. Patients daughter thinks she might be taking it every 3-4 hours. But will check with patient and let her know that she should have some left.

## 2022-06-12 ENCOUNTER — Inpatient Hospital Stay: Payer: Medicare Other

## 2022-06-12 VITALS — BP 126/70 | HR 62 | Temp 98.7°F | Resp 18 | Ht 67.0 in | Wt 128.0 lb

## 2022-06-12 DIAGNOSIS — Z79899 Other long term (current) drug therapy: Secondary | ICD-10-CM | POA: Diagnosis not present

## 2022-06-12 DIAGNOSIS — C7931 Secondary malignant neoplasm of brain: Secondary | ICD-10-CM | POA: Diagnosis not present

## 2022-06-12 DIAGNOSIS — C3431 Malignant neoplasm of lower lobe, right bronchus or lung: Secondary | ICD-10-CM

## 2022-06-12 DIAGNOSIS — D500 Iron deficiency anemia secondary to blood loss (chronic): Secondary | ICD-10-CM | POA: Diagnosis not present

## 2022-06-12 DIAGNOSIS — Z5112 Encounter for antineoplastic immunotherapy: Secondary | ICD-10-CM | POA: Diagnosis not present

## 2022-06-12 MED ORDER — HEPARIN SOD (PORK) LOCK FLUSH 100 UNIT/ML IV SOLN
500.0000 [IU] | Freq: Once | INTRAVENOUS | Status: AC | PRN
  Administered 2022-06-12: 500 [IU]

## 2022-06-12 MED ORDER — SODIUM CHLORIDE 0.9 % IV SOLN: Freq: Once | INTRAVENOUS | Status: AC

## 2022-06-12 MED ORDER — SODIUM CHLORIDE 0.9% FLUSH
10.0000 mL | INTRAVENOUS | Status: DC | PRN
  Administered 2022-06-12: 10 mL

## 2022-06-12 MED ORDER — SODIUM CHLORIDE 0.9 % IV SOLN
200.0000 mg | Freq: Once | INTRAVENOUS | Status: AC
  Administered 2022-06-12: 200 mg via INTRAVENOUS
  Filled 2022-06-12: qty 8

## 2022-06-13 NOTE — Progress Notes (Signed)
Called no answer will mail letter

## 2022-06-16 ENCOUNTER — Telehealth: Payer: Self-pay

## 2022-06-16 DIAGNOSIS — C7931 Secondary malignant neoplasm of brain: Secondary | ICD-10-CM | POA: Diagnosis not present

## 2022-06-16 DIAGNOSIS — G9389 Other specified disorders of brain: Secondary | ICD-10-CM | POA: Diagnosis not present

## 2022-06-16 NOTE — Telephone Encounter (Signed)
Attempted to call and relay message from Zachery Dauer., PA.   I did not leave a message.  No answer and unidentified VM.

## 2022-06-16 NOTE — Telephone Encounter (Signed)
-----   Message from [name], PA-C sent at 06/16/2022  3:27 PM EST ----- Regarding: RE: pain I doubt insurance will refill a week early. She can add tylenol, alternate heat/ice. Dr. Bishop Dublin saw her a week ago didn't discuss pain and after that appt they called for a refill. She does not have cancer between the shoulders. She needs to use as prescribed and use other therapies to supplement Thanks ----- Message ----- From: Georgette Shell, RN Sent: 06/16/2022   3:22 PM EST To: [name], PA-C Subject: pain                                           Patient's son Legrand Como stop in to ask for a refill on her pain medication, oxycodone 10mg , last filled on 05/25/2022.  Per her daughter Loletha Carrow patient has been taking them more often then prescribed q3-4 hrs d/t pain between her shoulders.

## 2022-06-21 ENCOUNTER — Telehealth: Payer: Self-pay

## 2022-06-21 ENCOUNTER — Other Ambulatory Visit: Payer: Self-pay

## 2022-06-21 DIAGNOSIS — C3431 Malignant neoplasm of lower lobe, right bronchus or lung: Secondary | ICD-10-CM

## 2022-06-21 DIAGNOSIS — C7931 Secondary malignant neoplasm of brain: Secondary | ICD-10-CM

## 2022-06-21 NOTE — Telephone Encounter (Signed)
Pt's son walked into clinic to report his mom isn't feeling well. She is c/o headache, & cough. Afebrile. He states they took her to the ER, but wait time was too long. He wanted to know if she could take something like DayQuil/NyQuil? I recommended that he call her PCP or take her to Urgent Care. I asked if they had tested her for COVID. He replied, "She doesn't have COVID". I also told him that the pain she was having between her shoulders was not cancer related per [facility]. She can use tylenol in between oxycodone as needed, and alternate heat/ice. Pt's last pain med refill was 05/25/2022 60 tabs

## 2022-06-22 ENCOUNTER — Encounter: Payer: Self-pay | Admitting: Oncology

## 2022-06-23 ENCOUNTER — Encounter: Payer: Self-pay | Admitting: Oncology

## 2022-06-23 ENCOUNTER — Other Ambulatory Visit: Payer: Self-pay | Admitting: Oncology

## 2022-06-23 ENCOUNTER — Inpatient Hospital Stay: Payer: Medicare Other

## 2022-06-23 ENCOUNTER — Inpatient Hospital Stay (INDEPENDENT_AMBULATORY_CARE_PROVIDER_SITE_OTHER): Payer: Medicare Other | Admitting: Oncology

## 2022-06-23 VITALS — BP 151/61 | HR 57 | Temp 97.8°F | Resp 18 | Ht 67.0 in | Wt 124.2 lb

## 2022-06-23 DIAGNOSIS — C7931 Secondary malignant neoplasm of brain: Secondary | ICD-10-CM

## 2022-06-23 DIAGNOSIS — D500 Iron deficiency anemia secondary to blood loss (chronic): Secondary | ICD-10-CM | POA: Diagnosis not present

## 2022-06-23 DIAGNOSIS — D649 Anemia, unspecified: Secondary | ICD-10-CM

## 2022-06-23 DIAGNOSIS — Z5112 Encounter for antineoplastic immunotherapy: Secondary | ICD-10-CM | POA: Diagnosis not present

## 2022-06-23 DIAGNOSIS — C3431 Malignant neoplasm of lower lobe, right bronchus or lung: Secondary | ICD-10-CM

## 2022-06-23 DIAGNOSIS — Z79899 Other long term (current) drug therapy: Secondary | ICD-10-CM | POA: Diagnosis not present

## 2022-06-23 LAB — CMP (CANCER CENTER ONLY)
ALT: 28 U/L (ref 0–44)
AST: 38 U/L (ref 15–41)
Albumin: 4.1 g/dL (ref 3.5–5.0)
Alkaline Phosphatase: 77 U/L (ref 38–126)
Anion gap: 9 (ref 5–15)
BUN: 30 mg/dL — ABNORMAL HIGH (ref 8–23)
CO2: 22 mmol/L (ref 22–32)
Calcium: 9 mg/dL (ref 8.9–10.3)
Chloride: 106 mmol/L (ref 98–111)
Creatinine: 1.22 mg/dL — ABNORMAL HIGH (ref 0.44–1.00)
GFR, Estimated: 46 mL/min — ABNORMAL LOW (ref >60)
Glucose, Bld: 101 mg/dL — ABNORMAL HIGH (ref 70–99)
Potassium: 3.5 mmol/L (ref 3.5–5.1)
Sodium: 137 mmol/L (ref 135–145)
Total Bilirubin: 0.6 mg/dL (ref 0.3–1.2)
Total Protein: 7 g/dL (ref 6.5–8.1)

## 2022-06-23 LAB — CBC WITH DIFFERENTIAL (CANCER CENTER ONLY)
Abs Immature Granulocytes: 0 10*3/uL (ref 0.00–0.07)
Basophils Absolute: 0 10*3/uL (ref 0.0–0.1)
Basophils Relative: 1 %
Eosinophils Absolute: 0.1 10*3/uL (ref 0.0–0.5)
Eosinophils Relative: 2 %
HCT: 38.4 % (ref 36.0–46.0)
Hemoglobin: 11.9 g/dL — ABNORMAL LOW (ref 12.0–15.0)
Immature Granulocytes: 0 %
Lymphocytes Relative: 40 %
Lymphs Abs: 1.6 10*3/uL (ref 0.7–4.0)
MCH: 29 pg (ref 26.0–34.0)
MCHC: 31 g/dL (ref 30.0–36.0)
MCV: 93.7 fL (ref 80.0–100.0)
Monocytes Absolute: 0.4 10*3/uL (ref 0.1–1.0)
Monocytes Relative: 9 %
Neutro Abs: 1.9 10*3/uL (ref 1.7–7.7)
Neutrophils Relative %: 48 %
Platelet Count: 193 10*3/uL (ref 150–400)
RBC: 4.1 MIL/uL (ref 3.87–5.11)
RDW: 17.7 % — ABNORMAL HIGH (ref 11.5–15.5)
WBC Count: 3.9 10*3/uL — ABNORMAL LOW (ref 4.0–10.5)
nRBC: 0 % (ref 0.0–0.2)

## 2022-06-23 LAB — TSH: TSH: 2.867 u[IU]/mL (ref 0.350–4.500)

## 2022-06-23 LAB — IRON AND TIBC
Iron: 47 ug/dL (ref 28–170)
Saturation Ratios: 15 % (ref 10.4–31.8)
TIBC: 315 ug/dL (ref 250–450)
UIBC: 268 ug/dL

## 2022-06-23 LAB — FERRITIN: Ferritin: 153 ng/mL (ref 11–307)

## 2022-06-23 MED ORDER — OXYCODONE HCL 10 MG PO TABS: 10.0000 mg | ORAL_TABLET | Freq: Four times a day (QID) | ORAL | 0 refills | Status: DC | PRN

## 2022-06-23 NOTE — Progress Notes (Signed)
Patient Care Team: Raven Fat, MD as PCP - General (Internal Medicine) [name] as Physician Assistant (Cardiology) Raven Beckwith, MD as Consulting Physician (Oncology)  Clinic Day: 06/23/22   Referring physician: Crist Fat, MD  ASSESSMENT & PLAN:   Assessment & Plan: Lung cancer, lower lobe [facility]) She received 1 dose of chemotherapy in June 2022, in [place], with combination cisplatin/Taxol/pembrolizumab, and tolerated this very poorly and ended up in the hospital twice. She is now receiving palliative pembrolizumab since October of 2022, and tolerating well. CT chest from January 2023 is stable to mildly improved.  The patient took a break and went to [place] for a time and so she missed some doses. We did resume treatment with pembrolizumab every 3 weeks.  She did finally agree to a port placement as she realizes this is long-term therapy. She had repeat scans in September of 2023, and all appears stable.    Brain metastases [facility]) Brain metastases diagnosed in April 2022.  This presented with right sided head pain and pain of the right eye.  She received stereotactic radiation to the two brain lesions. MRI brain from September 26th revealed four metastatic deposits in the brain. Several show mild associated hemorrhage. Mild edema. No mass-effect or midline shift. We will continue to monitor these. MRI brain from January revealed a mixed response with one new enhancing lesion in the right cerebellar hemisphere measuring 3 mm. One lesion in the left frontal lobe has resolved, one lesion in the right occipital lobe is stable, and two lesions are mildly decreased in size.  Repeat scan in September of 2023 shows resolution of the right cerebellar lesion and decreased size of the right occipital and bilateral frontal lesions. Repeat MRI of the brain in December, 2023 is stable.   Mild anemia Her hemoglobin was 12.3 in March, was down to 11.0, and down to 9.5 in  September.  Iron studies revealed iron deficiency and she did not tolerate oral supplement so was given IV iron with good response. Her hemoglobin came up from 10.8 in November to 11.9 today. Her iron studies shows improvement of iron saturation from 10% to 15% and ferritin level from 9 to 153.  Plan We reviewed the MRI of the brain as well as her lab work and we are encouraged by the stable to improved appearance.  She has resumed pembrolizumab. However, she feels poorly and her performance status is deteriorating. She is not eating well and has had low blood sugars. We will schedule a CT of the chest since she has missed that appointment. We will have her stop the Januvia and Glipizide until she is able to see her primary care provider. I will see her back in 1 week with CBC and CMP to review the chest CT result and decide whether to conntinue treatments. Her next infusion will be due 07/03/22. If her CT shows progression of disease, I do not think she can tolerate more aggressive chemotherapy. We can still provide palliative care for her. The patient understands the plans discussed today and is in agreement with them.  She knows to contact our office if she develops concerns prior to her next appointment.   I provided 30 minutes of face-to-face time during this this encounter and > 50% was spent counseling as documented under my assessment and plan.   Raven Beckwith, MD  [facility] AT [facility] [address] [place] [address] Dept:  [phone] Dept Fax: [phone]      CHIEF COMPLAINT:  CC: A 76 year old female with history of lung cancer here for 3 week evaluation  Current Treatment:  Pembrolizumab  INTERVAL HISTORY:  [name] is here today for repeat clinical assessment.  She states that she does not feel good, has body aches, fatigue, and that her blood sugar keeps dropping. She has anorexia and is not eating well. I had  offered Remeron for her appetite but I don't think she is taking it. She takes her medication Glipizide once a day. She does a have slight persistent cough. I suggested that we stop her Glipizide and Januvia until she schedules an appointment with her Primary Care Provider, and recommend she do so immediately. Her MRI showed a lot of motion artifact so was difficult visualize however, the lesions in the frontal lobe and the right occipital lobe appear stable. Her last treatment was last week. I suggest she have a CT scan of her chest by next week and I can see her by the end of next week. I ordered her a refill for oxycodone to take for her pain. Her son says that she takes more then 2 a day and I advised her to take 1 a day for the next month. Her hemoglobin is low but has shown improvement from 10.8 to 11.9 with improvement in her iron studies.She denies signs of infection such as sore throat, sinus drainage, cough, or urinary symptoms.  She denies fevers or recurrent chills. She denies pain. She denies nausea, vomiting, chest pain, dyspnea or cough. Her appetite has decreased and her weight has decreased 4 pounds over last week .   ALLERGIES:  is allergic to ergocalciferol.  MEDICATIONS:  Current Outpatient Medications  Medication Sig Dispense Refill   albuterol (VENTOLIN HFA) 108 (90 Base) MCG/ACT inhaler Inhale 2 puffs into the lungs every 4 (four) hours as needed.     ALPRAZolam (XANAX) 0.5 MG tablet Take 1 tablet (0.5 mg total) by mouth 3 (three) times daily as needed for anxiety. 90 tablet 0   atorvastatin (LIPITOR) 80 MG tablet Take 80 mg by mouth daily.     ATROVENT HFA 17 MCG/ACT inhaler Inhale into the lungs.     benzonatate (TESSALON) 100 MG capsule Take 100 mg by mouth 3 (three) times daily.     gabapentin (NEURONTIN) 100 MG capsule Take by mouth.     JANUVIA 100 MG tablet Take 100 mg by mouth daily.     losartan (COZAAR) 25 MG tablet Take 25 mg by mouth daily.     metoprolol tartrate  (LOPRESSOR) 25 MG tablet Take 25 mg by mouth 2 (two) times daily.     mupirocin cream (BACTROBAN) 2 % Apply topically.     ondansetron (ZOFRAN) 4 MG tablet Take 1 tablet (4 mg total) by mouth every 4 (four) hours as needed for nausea. 90 tablet 3   Oxycodone HCl 10 MG TABS Take 1 tablet (10 mg total) by mouth every 6 (six) hours as needed (for severe pain). 60 tablet 0   pantoprazole (PROTONIX) 40 MG tablet Take 1 tablet (40 mg total) by mouth 2 (two) times daily. 60 tablet 5   prochlorperazine (COMPAZINE) 10 MG tablet Take 1 tablet (10 mg total) by mouth every 6 (six) hours as needed for nausea or vomiting. 90 tablet 3   rivaroxaban (XARELTO) 20 MG TABS tablet Take 1 tablet (20 mg total) by mouth daily with supper. 90 tablet 1  SIMBRINZA 1-0.2 % SUSP Place 1 drop into the right eye 2 (two) times daily.     No current facility-administered medications for this visit.    HISTORY OF PRESENT ILLNESS:   Oncology History  Lung cancer, lower lobe (HCC)  07/07/2020 Initial Diagnosis   Lung cancer, lower lobe (HCC)   10/15/2020 Cancer Staging   Staging form: Lung, AJCC 8th Edition - Clinical stage from 10/15/2020: Stage IV (cT1b, cN0, cM1) - Signed by Raven Beckwith, MD on 03/31/2021 Histopathologic type: Squamous cell carcinoma, NOS Stage prefix: Initial diagnosis Histologic grade (G): G3 Histologic grading system: 4 grade system Laterality: Right Tumor size (mm): 18 Sites of metastasis: Brain Lymph-vascular invasion (LVI): Presence of LVI unknown/indeterminate Diagnostic confirmation: Positive histology Specimen type: Core Needle Biopsy Staged by: Managing physician Type of lung cancer: Locally advanced or metastatic non-small cell lung cancer Stage used in treatment planning: Yes National guidelines used in treatment planning: Yes Type of national guideline used in treatment planning: NCCN   04/08/2021 - 02/07/2022 Chemotherapy   Patient is on Treatment Plan : LUNG NSCLC flat dose  Pembrolizumab Q21D     04/08/2021 -  Chemotherapy   Patient is on Treatment Plan : LUNG NSCLC Pembrolizumab (200) q21d         REVIEW OF SYSTEMS:  Review of Systems  Constitutional:  Positive for appetite change and fatigue. Negative for chills, diaphoresis, fever and unexpected weight change.  HENT:  Negative.  Negative for hearing loss, lump/mass, mouth sores, nosebleeds, sore throat, tinnitus, trouble swallowing and voice change.   Eyes: Negative.  Negative for eye problems and icterus.  Respiratory:  Positive for cough. Negative for chest tightness, hemoptysis, shortness of breath and wheezing.   Cardiovascular: Negative.  Negative for chest pain, leg swelling and palpitations.  Gastrointestinal: Negative.  Negative for abdominal distention, abdominal pain, blood in stool, constipation, diarrhea, nausea, rectal pain and vomiting.  Endocrine: Negative.  Negative for hot flashes.       She has had several episodes of severe hypoglycemia.   Genitourinary: Negative.  Negative for bladder incontinence, difficulty urinating, dyspareunia, dysuria, frequency, hematuria, menstrual problem, nocturia, pelvic pain, vaginal bleeding and vaginal discharge.   Musculoskeletal:  Positive for arthralgias and back pain. Negative for flank pain, gait problem, myalgias, neck pain and neck stiffness.  Skin: Negative.  Negative for itching, rash and wound.  Neurological: Negative.  Negative for dizziness, extremity weakness, gait problem, headaches, light-headedness, numbness, seizures and speech difficulty.  Hematological: Negative.  Negative for adenopathy. Does not bruise/bleed easily.  Psychiatric/Behavioral: Negative.  Negative for confusion, decreased concentration, depression, sleep disturbance and suicidal ideas. The patient is not nervous/anxious.      VITALS:  Blood pressure (!) 151/61, pulse (!) 57, temperature 97.8 F (36.6 C), temperature source Oral, resp. rate 18, height 5\' 7"  (1.702 m),  weight 124 lb 3.2 oz (56.3 kg), SpO2 98 %.  Wt Readings from Last 3 Encounters:  07/12/22 127 lb 9.6 oz (57.9 kg)  06/23/22 124 lb 3.2 oz (56.3 kg)  06/12/22 128 lb 0.6 oz (58.1 kg)    Body mass index is 19.45 kg/m.  Performance status (ECOG): 1 - Symptomatic but completely ambulatory  PHYSICAL EXAM:  Physical Exam Vitals and nursing note reviewed. Exam conducted with a chaperone present.  Constitutional:      General: She is not in acute distress.    Appearance: Normal appearance. She is well-developed. She is not toxic-appearing or diaphoretic.  HENT:     Head: Normocephalic and  atraumatic.     Right Ear: Tympanic membrane, ear canal and external ear normal. There is no impacted cerumen.     Left Ear: Tympanic membrane, ear canal and external ear normal. There is no impacted cerumen.     Nose: Nose normal. No congestion or rhinorrhea.     Mouth/Throat:     Mouth: Mucous membranes are moist.     Pharynx: Oropharynx is clear. No oropharyngeal exudate or posterior oropharyngeal erythema.  Eyes:     General: Lids are normal. Vision grossly intact. No scleral icterus.       Right eye: No discharge.        Left eye: No discharge.     Extraocular Movements: Extraocular movements intact.     Conjunctiva/sclera: Conjunctivae normal.     Pupils: Pupils are equal, round, and reactive to light.  Cardiovascular:     Rate and Rhythm: Normal rate and regular rhythm.     Pulses: Normal pulses.     Heart sounds: Normal heart sounds. No murmur heard.    No friction rub. No gallop.  Pulmonary:     Effort: Pulmonary effort is normal. No respiratory distress.     Breath sounds: Normal breath sounds. No stridor. No wheezing or rhonchi.  Chest:     Chest wall: No tenderness.  Abdominal:     General: Bowel sounds are normal. There is no distension.     Palpations: Abdomen is soft. There is no hepatomegaly, splenomegaly or mass.     Tenderness: There is no abdominal tenderness. There is no  right CVA tenderness, left CVA tenderness, guarding or rebound.     Hernia: No hernia is present.  Musculoskeletal:        General: No swelling, tenderness, deformity or signs of injury. Normal range of motion.     Cervical back: Normal range of motion and neck supple. No rigidity or tenderness.     Right lower leg: No edema.     Left lower leg: No edema.     Comments: She has had one fall and hit her right face.  Lymphadenopathy:     Cervical: No cervical adenopathy.     Right cervical: No superficial, deep or posterior cervical adenopathy.    Left cervical: No superficial, deep or posterior cervical adenopathy.     Upper Body:     Right upper body: No supraclavicular, axillary or pectoral adenopathy.     Left upper body: No supraclavicular, axillary or pectoral adenopathy.  Skin:    General: Skin is warm and dry.     Coloration: Skin is pale. Skin is not jaundiced.     Findings: No bruising, erythema, lesion or rash.  Neurological:     General: No focal deficit present.     Mental Status: She is alert and oriented to person, place, and time. Mental status is at baseline.     Cranial Nerves: No cranial nerve deficit.     Sensory: No sensory deficit.     Motor: Weakness present.     Coordination: Coordination normal.     Gait: Gait normal.     Deep Tendon Reflexes: Reflexes normal.     Comments: She has a right hemiparesis.  Psychiatric:        Mood and Affect: Mood normal.        Behavior: Behavior normal. Behavior is cooperative.        Thought Content: Thought content normal.        Judgment: Judgment normal.  LABORATORY DATA:  CMP    Component Value Date/Time   NA 137 06/23/2022 1532   NA 137 04/24/2022 0000   K 3.5 06/23/2022 1532   CL 106 06/23/2022 1532   CO2 22 06/23/2022 1532   GLUCOSE 101 (H) 06/23/2022 1532   BUN 30 (H) 06/23/2022 1532   BUN 22 (A) 04/24/2022 0000   CREATININE 1.22 (H) 06/23/2022 1532   CALCIUM 9.0 06/23/2022 1532   PROT 7.0  06/23/2022 1532   ALBUMIN 4.1 06/23/2022 1532   AST 38 06/23/2022 1532   ALT 28 06/23/2022 1532   ALKPHOS 77 06/23/2022 1532   BILITOT 0.6 06/23/2022 1532   GFRNONAA 46 (L) 06/23/2022 1532   GFRAA 59 (L) 10/20/2016 1618    No results found for: "SPEP", "UPEP" CBC Lab Results  Component Value Date   WBC 3.9 (L) 06/23/2022   NEUTROABS 1.9 06/23/2022   HGB 11.9 (L) 06/23/2022   HCT 38.4 06/23/2022   MCV 93.7 06/23/2022   PLT 193 06/23/2022      Chemistry      Component Value Date/Time   NA 137 06/23/2022 1532   NA 137 04/24/2022 0000   K 3.5 06/23/2022 1532   CL 106 06/23/2022 1532   CO2 22 06/23/2022 1532   BUN 30 (H) 06/23/2022 1532   BUN 22 (A) 04/24/2022 0000   CREATININE 1.22 (H) 06/23/2022 1532   GLU 268 04/24/2022 0000      Component Value Date/Time   CALCIUM 9.0 06/23/2022 1532   ALKPHOS 77 06/23/2022 1532   AST 38 06/23/2022 1532   ALT 28 06/23/2022 1532   BILITOT 0.6 06/23/2022 1532     Component Ref Range & Units 4 wk ago  Hgb A1c MFr Bld 4.8 - 5.6 % 5.8 High      RADIOGRAPHIC STUDIES: I have personally reviewed the radiological images as listed and agreed with the findings in the report. CLINICAL DATA: Lung cancer, metastatic to brain  EXAM: 03/14/22 MRI HEAD WITHOUT AND WITH CONTRAST  TECHNIQUE: Multiplanar, multiecho pulse sequences of the brain and surrounding structures were obtained without and with intravenous contrast.  CONTRAST: 72mL GADAVIST GADOBUTROL 1 MMOL/ML IV SOLN  COMPARISON: 11/07/2021  FINDINGS: Brain: No new enhancing lesion is seen. Redemonstrated 4 mm faintly peripherally enhancing lesion in the right occipital lobe (series 16, image 77), unchanged. Punctate focus of enhancement in the left frontal operculum (series 16, image 88), unchanged. Unchanged small area of cortical enhancement in the posterior right frontal lobe measuring up to 4 mm (series 16, image 113), unchanged. These lesions are associated with  hemosiderin deposition.  No restricted diffusion to suggest acute or subacute infarct. No acute hemorrhage, mass effect, or midline shift. No hydrocephalus or extra-axial collection. 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 Confluent T2 hyperintense signal in the periventricular white matter, likely the sequela of moderate to severe chronic small vessel ischemic disease.  Vascular: Normal arterial flow voids. Normal arterial and venous enhancement.  Skull and upper cervical spine: Normal marrow signal.  Sinuses/Orbits: No acute finding. Left lens replacement.  Other: Trace fluid in left mastoid air cells.  IMPRESSION: No new enhancing lesions. Unchanged appearance of previously noted right frontal, right occipital, and left frontal lesions.    EXAM: 11/07/21 MRI HEAD WITHOUT AND WITH CONTRAST  TECHNIQUE:  Multiplanar, multiecho pulse sequences of the brain and surrounding  structures were obtained without and with intravenous contrast.  COMPARISON: MRI of the brain June 29, 2021.  FINDINGS:  The study is degraded by motion.  Brain: No new enhancing lesion identified.  Inferior right cerebellar hemisphere lesion described on prior MRI  is no longer seen.  Interval decreased size of enhancing lesion in the right occipital  lobe, now measuring 4 mm compared to 6 mm on prior (series 11, image  106).  Punctate residual focus of contrast enhancement with hemosiderin  deposit in the left frontal operculum, now measuring 1 mm compared  to 2 mm on prior (series 11, image 122).  Cortical focus of residual contrast enhancement with hemosiderin  deposit in the posterior right frontal lobe, now measuring 4 mm  compared to 5 mm on prior (series 11, image 158).  No acute infarction, hemorrhage, hydrocephalus or extra-axial  collection. 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  Scattered and confluent foci of T2 hyperintensity are seen within  the white matter of the cerebral hemispheres are nonspecific, most  likely related to chronic small vessel ischemia.  Vascular: Normal flow voids.  Skull and upper cervical spine: Normal marrow signal. Sinuses/Orbits: Negative.  Other: None.  IMPRESSION:  Findings consistent with treatment response with resolution of right  cerebella lesion described on prior MRI and decrease in size of  right occipital and bilateral frontal lesions, as described above.  No new enhancing lesion identified.   Electronically Signed  By: Baldemar Lenis M.D.  On: 11/08/2021 15:25     EXAM: 11/07/21 CT CHEST WITHOUT CONTRAST  TECHNIQUE:  Multidetector CT imaging of the chest was performed following the  standard protocol without IV contrast.  RADIATION DOSE REDUCTION: This exam was performed according to the  departmental dose-optimization program which includes automated  exposure control, adjustment of the mA and/or kV according to  patient size and/or use of iterative reconstruction technique.  COMPARISON: 06/29/2021  FINDINGS:  Cardiovascular: Aortic atherosclerosis. Normal heart size. 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 No pericardial effusion.  Mediastinum/Nodes: No enlarged mediastinal, hilar, or axillary lymph  nodes. Thyroid gland, trachea, and esophagus demonstrate no  significant findings.  Lungs/Pleura: Unchanged spiculated nodule of the superior segment  right lower lobe measuring 1.9 x 1.6 cm (series 301, image 62).  Multiple additional generally clustered, irregular nodules of the  right lower lobe are likewise unchanged, most notably a 0.7 cm  nodule of the peripheral subpleural right lower lobe (series 301,  image 71). Background of extensive, fine centrilobular nodularity  throughout,  generally concentrated in the lung apices. No pleural  effusion or pneumothorax.  Upper Abdomen: No acute abnormality. Stable, benign fatty  attenuation left adrenal adenoma (series 2, image 117). Status post  cholecystectomy.  Musculoskeletal: No chest wall abnormality. No suspicious osseous  lesions identified.  IMPRESSION:  1. Unchanged spiculated nodule of the superior segment right lower  lobe measuring 1.9 x 1.6 cm.  2. Multiple additional generally clustered, irregular nodules of the  right lower lobe are likewise unchanged, measuring 0.7 cm and  smaller.  3. Background of extensive, fine centrilobular nodularity  throughout, concentrated in the lung apices, consistent with  smoking-related respiratory bronchiolitis.  4. Coronary artery disease.  5. Stable, benign fatty attenuation left adrenal adenoma. This  demonstrates internal macroscopic fatty attenuation and has been  stable greater than 5 years.  Aortic Atherosclerosis (ICD10-I70.0).   Electronically Signed  By: Jearld Lesch M.D.  On: 11/08/2021 15:09  I,Jasmine M Lassiter,acting as a scribe for Raven Beckwith, MD.,have documented all relevant documentation on the behalf of Raven Beckwith, MD,as directed by  Raven Beckwith, MD while in the presence of Raven Beckwith, MD.

## 2022-06-24 LAB — T4: T4, Total: 9 ug/dL (ref 4.5–12.0)

## 2022-06-29 ENCOUNTER — Encounter: Payer: Self-pay | Admitting: Oncology

## 2022-06-29 ENCOUNTER — Encounter: Payer: Self-pay | Admitting: Hematology and Oncology

## 2022-06-30 ENCOUNTER — Other Ambulatory Visit: Payer: Medicare Other

## 2022-06-30 ENCOUNTER — Encounter: Payer: Self-pay | Admitting: Oncology

## 2022-06-30 ENCOUNTER — Ambulatory Visit: Payer: Medicare Other | Admitting: Oncology

## 2022-06-30 ENCOUNTER — Telehealth: Payer: Self-pay | Admitting: Oncology

## 2022-06-30 MED FILL — Pembrolizumab IV Soln 100 MG/4ML (25 MG/ML): INTRAVENOUS | Qty: 8 | Status: AC

## 2022-06-30 NOTE — Telephone Encounter (Signed)
06/30/22 Spoke with patient to reschedule all appts.Patient stated she was tired of coming and refused anymore appts.

## 2022-06-30 NOTE — Progress Notes (Incomplete)
Patient Care Team: Townsend Roger, MD as PCP - General (Internal Medicine) Sharmon Revere as Physician Assistant (Cardiology) Derwood Kaplan, MD as Consulting Physician (Oncology)  Clinic Day: 06/30/22   Referring physician: Townsend Roger, MD  ASSESSMENT & PLAN:   Assessment & Plan: Lung cancer, lower lobe ([place]) Stage IV lung cancer diagnosed in April 2022.  She received 1 dose of chemotherapy in June 2022, with combination cisplatin/paclitaxel/pembrolizumab, and tolerated this very poorly.  She ended up in the hospital twice.  Since October 2022, she has been receiving palliative pembrolizumab and has tolerated this well.  She took a break and went to [place] for a time and so she missed some doses.  Also, while she was away, she fell and had a left hip fracture requiring surgery.  She was seen in May with a CT chest and MRI head prior to resuming pembrolizumab after her break.  Her disease appeared stable, so she has continued with pembrolizumab every 3 weeks. Today, she complains of weakness and fatigue.    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  This presented with right sided head pain and pain of the right eye.  She received stereotactic radiation to the two brain lesions. MRI brain in September revealed four metastatic deposits in the brain. Several show mild associated hemorrhage. Mild edema. No mass-effect or midline shift.  She was placed on immunotherapy with pembrolizumab every 3 weeks in October.  MRI brain in January revealed a mixed response with one new enhancing lesion in the right cerebellar hemisphere measuring 3 mm. One lesion in the left frontal lobe has resolved, one lesion in the right occipital lobe is stable, and two lesions were mildly decreased in size.    MRI brain in May revealed resolution of the right cerebellar lesion and decreased size of the right occipital and bilateral frontal lesions.However she is  now having neurologic symptoms, and I am concerned about progression.    Iron deficiency anemia I suspect she is bleeding and has severe iron deficiency. We will start on oral supplements but consider IV iron if she is unable to tolerate it. She does complain of discomfort of her epigastric area, but I dont think I can get a GI evaluation immediately so will increase her Protonix.      Plan She was in the emergency room 03/02/22, they completed a chest x-ray which revealed patchy airspace opacities in the right mid lower lung correspond to the irregular pulmonary nodules seen on most recent CT chest 11/07/2021, unchanged compared to prior exam. There was no new focal consolidation or pleural effusion. I am concerned about her increasing symptoms and feel we need to restage her with CT chest and MRI of the brain. We will put her treatments on hold while we do the evaluation. I will bring her back after these tests are done and repeat her CBC and CMP at that time. We can then make decisions on treatment moving forward. The patient understands the plans discussed today and is in agreement with them.  She knows to contact our office if she develops concerns prior to her next appointment.   [facility] [address] [place] [address] Dept: [phone] Dept Fax: [phone]   No orders of the defined types were placed in this encounter.     CHIEF COMPLAINT:  CC: A 77 year old female with history  of lung cancer here for 3 week evaluation  Current Treatment:  Pembrolizumab  INTERVAL HISTORY:  [name] is here today for repeat clinical assessment.         06/23/22 [name] is here today for repeat clinical assessment.  She states that she does not feel good, has body aches, fatigue, and that her blood sugar keeps dropping. She takes her medication GlipiZide once a day. She does a have slight persistent  cough. I suggested that we stop her GlipiZide and Januvia until she schedule a appointment with her Primary Care Provider immediately. Her MRI showed a lot of motion artifact so was difficult visualize however, the lesions in the frontal lobe and the right occipital lobe appear stable. Her last treatment was last week. I suggest she receive a CT scan of her chest by next week and I see her by the end of next week. I ordered her a refill for oxycodone to take for her pain. Her son says that she takes more then 2 a day and I advised her to take 1 a day for the next month. Her hemoglobin is low but has shown improvement at 10.8. /// She states she has been feeling tired and wanting to stretch out and go to sleep. She notes she was having pain in her neck (left side) along with her left leg, not being able to lift it up and was carried out. She also states she is hurting between her shoulder blades. She was in the emergency room 03/02/22, they completed a chest x-ray which revealed patchy airspace opacities in the right mid lower lung correspond to the irregular pulmonary nodules seen on most recent CT chest 11/07/2021, unchanged compared to prior exam. There was no new focal consolidation or pleural effusion. She reports having to take something to help her bowels move. She does complain of black stools. She continues to take Protonix and will now take it twice daily. I had done a lab evaluation of her anemia and she is clearly very iron deficient.Her B12 level was low normal. I recommended she take an iron supplement daily and B12. I also recommended she get on Senokot S twice daily. I think we need to restage her cancer and evaluate the status of her brain metastases. She denies fevers or chills. She denies pain. Her appetite is good. Her weight has been stable.  I have reviewed the past medical history, past surgical history, social history and family history with the patient and they are unchanged from previous  note.  ALLERGIES:  is allergic to ergocalciferol.  MEDICATIONS:  Current Outpatient Medications  Medication Sig Dispense Refill   albuterol (VENTOLIN HFA) 108 (90 Base) MCG/ACT inhaler Inhale 2 puffs into the lungs every 4 (four) hours as needed.     ALPRAZolam (XANAX) 0.5 MG tablet Take 1 tablet (0.5 mg total) by mouth 3 (three) times daily as needed for anxiety. 90 tablet 0   atorvastatin (LIPITOR) 80 MG tablet Take 80 mg by mouth daily.     ATROVENT HFA 17 MCG/ACT inhaler Inhale into the lungs.     benzonatate (TESSALON) 100 MG capsule Take 100 mg by mouth 3 (three) times daily.     gabapentin (NEURONTIN) 100 MG capsule Take by mouth.     glipiZIDE (GLUCOTROL XL) 5 MG 24 hr tablet Take 5 mg by mouth daily.     HUMULIN R 100 UNIT/ML injection      JANUVIA 100 MG tablet Take 100 mg by mouth daily.  losartan (COZAAR) 25 MG tablet Take 25 mg by mouth daily.     metoprolol tartrate (LOPRESSOR) 25 MG tablet Take 25 mg by mouth 2 (two) times daily.     mupirocin cream (BACTROBAN) 2 % Apply topically.     ondansetron (ZOFRAN) 4 MG tablet Take 1 tablet (4 mg total) by mouth every 4 (four) hours as needed for nausea. 90 tablet 3   Oxycodone HCl 10 MG TABS Take 1 tablet (10 mg total) by mouth every 6 (six) hours as needed (for severe pain). 60 tablet 0   pantoprazole (PROTONIX) 40 MG tablet Take 1 tablet (40 mg total) by mouth 2 (two) times daily. 60 tablet 5   prochlorperazine (COMPAZINE) 10 MG tablet Take 1 tablet (10 mg total) by mouth every 6 (six) hours as needed for nausea or vomiting. 90 tablet 3   rivaroxaban (XARELTO) 20 MG TABS tablet Take 20 mg by mouth daily with supper.     SIMBRINZA 1-0.2 % SUSP Place 1 drop into the right eye 2 (two) times daily.     No current facility-administered medications for this visit.    HISTORY OF PRESENT ILLNESS:   Oncology History  Lung cancer, lower lobe ([place])  07/07/2020 Initial Diagnosis   Lung cancer, lower lobe ([place])   10/15/2020 Cancer  Staging   Staging form: Lung, AJCC 8th Edition - Clinical stage from 10/15/2020: Stage IV (cT1b, cN0, cM1) - Signed by Derwood Kaplan, MD on 03/31/2021 Histopathologic type: Squamous cell carcinoma, NOS Stage prefix: Initial diagnosis Histologic grade (G): G3 Histologic grading system: 4 grade system Laterality: Right Tumor size (mm): 18 Sites of metastasis: Brain Lymph-vascular invasion (LVI): Presence of LVI unknown/indeterminate Diagnostic confirmation: Positive histology Specimen type: Core Needle Biopsy Staged by: Managing physician Type of lung cancer: Locally advanced or metastatic non-small cell lung cancer Stage used in treatment planning: Yes National guidelines used in treatment planning: Yes Type of national guideline used in treatment planning: NCCN   04/08/2021 - 02/07/2022 Chemotherapy   Patient is on Treatment Plan : LUNG NSCLC flat dose Pembrolizumab Q21D     04/08/2021 -  Chemotherapy   Patient is on Treatment Plan : LUNG NSCLC Pembrolizumab (200) q21d         REVIEW OF SYSTEMS:   Constitutional: Denies fevers, chills or abnormal weight loss Eyes: Denies blurriness of vision Ears, nose, mouth, throat, and face: Denies mucositis or sore throat Respiratory: Denies cough, dyspnea or wheezes Cardiovascular: Denies palpitation, chest discomfort or lower extremity swelling Gastrointestinal:  Denies nausea, heartburn or change in bowel habits Skin: Denies abnormal skin rashes Lymphatics: Denies new lymphadenopathy or easy bruising Neurological:Denies numbness, tingling or new weaknesses Behavioral/Psych: Mood is stable, no new changes  All other systems were reviewed with the patient and are negative.   VITALS:  There were no vitals taken for this visit.  Wt Readings from Last 3 Encounters:  06/23/22 124 lb 3.2 oz (56.3 kg)  06/12/22 128 lb 0.6 oz (58.1 kg)  06/08/22 127 lb 9.6 oz (57.9 kg)    There is no height or weight on file to calculate  BMI.  Performance status (ECOG): 1 - Symptomatic but completely ambulatory  PHYSICAL EXAM:   GENERAL:alert, no distress and comfortable SKIN: skin color, texture, turgor are normal, no rashes or significant lesions EYES: normal, Conjunctiva are pink and non-injected, sclera clear OROPHARYNX:no exudate, no erythema and lips, buccal mucosa, and tongue normal  NECK: supple, thyroid normal size, non-tender, without nodularity LYMPH:  no palpable  lymphadenopathy in the cervical, axillary or inguinal LUNGS: clear to auscultation and percussion with normal breathing effort HEART: regular rate & rhythm and no murmurs and no lower extremity edema ABDOMEN:abdomen soft, non-tender and normal bowel sounds Musculoskeletal:no cyanosis of digits and no clubbing  NEURO: alert & oriented x 3 with fluent speech, no focal motor/sensory deficits. Negative romberg.  LABORATORY DATA:  I have reviewed the data as listed    Component Value Date/Time   NA 137 06/23/2022 1532   NA 137 04/24/2022 0000   K 3.5 06/23/2022 1532   CL 106 06/23/2022 1532   CO2 22 06/23/2022 1532   GLUCOSE 101 (H) 06/23/2022 1532   BUN 30 (H) 06/23/2022 1532   BUN 22 (A) 04/24/2022 0000   CREATININE 1.22 (H) 06/23/2022 1532   CALCIUM 9.0 06/23/2022 1532   PROT 7.0 06/23/2022 1532   ALBUMIN 4.1 06/23/2022 1532   AST 38 06/23/2022 1532   ALT 28 06/23/2022 1532   ALKPHOS 77 06/23/2022 1532   BILITOT 0.6 06/23/2022 1532   GFRNONAA 46 (L) 06/23/2022 1532   GFRAA 59 (L) 10/20/2016 1618    No results found for: "SPEP", "UPEP" CBC Lab Results  Component Value Date   WBC 3.9 (L) 06/23/2022   NEUTROABS 1.9 06/23/2022   HGB 11.9 (L) 06/23/2022   HCT 38.4 06/23/2022   MCV 93.7 06/23/2022   PLT 193 06/23/2022      Chemistry      Component Value Date/Time   NA 137 06/23/2022 1532   NA 137 04/24/2022 0000   K 3.5 06/23/2022 1532   CL 106 06/23/2022 1532   CO2 22 06/23/2022 1532   BUN 30 (H) 06/23/2022 1532   BUN 22  (A) 04/24/2022 0000   CREATININE 1.22 (H) 06/23/2022 1532   GLU 268 04/24/2022 0000      Component Value Date/Time   CALCIUM 9.0 06/23/2022 1532   ALKPHOS 77 06/23/2022 1532   AST 38 06/23/2022 1532   ALT 28 06/23/2022 1532   BILITOT 0.6 06/23/2022 1532     Component Ref Range & Units 7 d ago (06/23/22) 3 mo ago (03/17/22) 4 mo ago (02/24/22) 4 mo ago (02/03/22) 5 mo ago (01/13/22) 6 mo ago (12/23/21) 7 mo ago (12/02/21)  TSH 0.350 - 4.500 uIU/mL 2.867 0.979 CM 2.465 CM 2.852 CM 2.550 CM 2.502 CM 1.801 CM             Component Ref Range & Units 7 d ago (06/23/22) 3 mo ago (03/17/22) 4 mo ago (02/24/22) 4 mo ago (02/03/22) 5 mo ago (01/13/22) 6 mo ago (12/23/21) 7 mo ago (12/02/21)  T4, Total 4.5 - 12.0 ug/dL 9.0 8.0 CM 9.9 CM 9.0 CM 9.2 CM 8.2 CM 10.3 CM     Component Ref Range & Units 7 d ago 4 mo ago  Ferritin 11 - 307 ng/mL 153 9 Low  CM        Component Ref Range & Units 7 d ago 4 mo ago  Iron 28 - 170 ug/dL 47 40  TIBC 250 - 450 ug/dL 315 419  Saturation Ratios 10.4 - 31.8 % 15 10 Low   UIBC ug/dL 268 379 CM       RADIOGRAPHIC STUDIES: I have personally reviewed the radiological images as listed and agreed with the findings in the report. No results found. EXAM: 03/14/2022 MRI HEAD WITHOUT AND WITH CONTRAST  TECHNIQUE: Multiplanar, multiecho pulse sequences of the brain and surrounding structures were obtained without and with  intravenous contrast. CONTRAST: 63mL GADAVIST GADOBUTROL 1 MMOL/ML IV SOLN COMPARISON: 11/07/2021 FINDINGS: Brain: No new enhancing lesion is seen. Redemonstrated 4 mm faintly peripherally enhancing lesion in the right occipital lobe (series 16, image 77), unchanged. Punctate focus of enhancement in the left frontal operculum (series 16, image 88), unchanged. Unchanged small area of cortical enhancement in the posterior right frontal lobe measuring up to 4 mm (series 16, image 113), unchanged. These lesions are associated with hemosiderin  deposition.  No restricted diffusion to suggest acute or subacute infarct. No acute hemorrhage, mass effect, or midline shift. No hydrocephalus or extra-axial collection. 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 Confluent T2 hyperintense signal in the periventricular white matter, likely the sequela of moderate to severe chronic small vessel ischemic disease. Vascular: Normal arterial flow voids. Normal arterial and venous enhancement. Skull and upper cervical spine: Normal marrow signal. Sinuses/Orbits: No acute finding. Left lens replacement. Other: Trace fluid in left mastoid air cells. IMPRESSION: No new enhancing lesions. Unchanged appearance of previously noted right frontal, right occipital, and left frontal lesions.    EXAM:03/02/2022 PORTABLE CHEST VIEW 1 IMPRESSION: Patchy airspace opacities in the right mid lower lung correspond to the irregular pulmonary nodules seen on most recent CT chest 11/07/2021, unchanged compared to prior exam.   No new focal consolidation or pleural effusion.  EXAM:11/07/21 MRI HEAD WITHOUT AND WITH CONTRAST IMPRESSION: Findings consistent with treatment response with resolution of tight cerebella lesion described on prior MRI and decrease in size of right occipital and bilateral frontal lesions, as described above. No new enhancing lesion identified.  EXAM:11/07/21 CT CHEST WITHOUT CONTRAST IMPRESSION: Unchanged spiculated nodule of the superior segment right lower lobe measuring 1.9 x 1.6 cm. Multiple additional generally clustered, irregular nodules of the right lower lobe are likewise unchanged, measuring 0.7 cm and smaller. Background of extensive, fine centrilobular nodularity throughout, concentrated in the lung apices, consistent with smoking-related respiratory bronchiolitis. Coronary artery disease Stable, benign fatty attenuation left adrenal adenoma. This  demonstrates internal macroscopic fatty attenuation and has been stable greater than 5 years.         I,Jasmine M Lassiter,acting as a scribe for Derwood Kaplan, MD.,have documented all relevant documentation on the behalf of Derwood Kaplan, MD,as directed by  Derwood Kaplan, MD while in the presence of Derwood Kaplan, MD.\

## 2022-07-03 ENCOUNTER — Ambulatory Visit: Payer: Medicare Other

## 2022-07-04 ENCOUNTER — Other Ambulatory Visit: Payer: Self-pay

## 2022-07-04 MED ORDER — RIVAROXABAN 20 MG PO TABS: 20.0000 mg | ORAL_TABLET | Freq: Every day | ORAL | 1 refills | Status: DC

## 2022-07-05 ENCOUNTER — Encounter: Payer: Self-pay | Admitting: Hematology and Oncology

## 2022-07-05 ENCOUNTER — Encounter: Payer: Self-pay | Admitting: Oncology

## 2022-07-09 ENCOUNTER — Encounter: Payer: Self-pay | Admitting: Hematology and Oncology

## 2022-07-09 ENCOUNTER — Encounter: Payer: Self-pay | Admitting: Oncology

## 2022-07-12 ENCOUNTER — Encounter: Payer: Self-pay | Admitting: Internal Medicine

## 2022-07-12 ENCOUNTER — Ambulatory Visit: Payer: 59 | Admitting: Internal Medicine

## 2022-07-12 VITALS — BP 138/84 | HR 69 | Temp 96.6°F | Resp 18 | Ht 67.0 in | Wt 127.6 lb

## 2022-07-12 DIAGNOSIS — N182 Chronic kidney disease, stage 2 (mild): Secondary | ICD-10-CM

## 2022-07-12 DIAGNOSIS — E1121 Type 2 diabetes mellitus with diabetic nephropathy: Secondary | ICD-10-CM

## 2022-07-12 DIAGNOSIS — C3431 Malignant neoplasm of lower lobe, right bronchus or lung: Secondary | ICD-10-CM

## 2022-07-12 NOTE — Addendum Note (Signed)
Addended by: Crist Fat on: 07/12/2022 10:29 AM   Modules accepted: Orders

## 2022-07-12 NOTE — Assessment & Plan Note (Signed)
She is to avoid nephrotoxins like NSAIDS.

## 2022-07-12 NOTE — Progress Notes (Signed)
Office Visit  Subjective   Patient ID: [name]   DOB: February 28, 1946   Age: 77 y.o.   MRN: [id number]   Chief Complaint Chief Complaint  Patient presents with   Follow-up    T2D,HTN     History of Present Illness The patient is a 77 yo female who I saw back in 03/2022 for a hospital followup.  She presented to [facility] ER on 03/21/2022 where she suffered a mechanical fall.  She tripped over a root where she landed on her right hip.  A plain film xray was obtained that showed a right hip fracture with a right femoral neck fracture with mild angulation and displacement.  She underwent a right hemiarthoplasty on 03/22/2022 without problems.  Her hospital course was complicated by her going into A. Fib with RVR where she was started on an amiodarone drip.  She did convert back to sinus rhythm and they transitioned her to oral amiodarone.  She did have some postoperative anemia where she was transfused 1 Unit of blood and her hemoglobin remained stable thereafter.  The patient was discharged to [facility] for short term rehab where she stayed there for 2 weeks.  Her daughter states she did not have any problems at [facility].  She is currently using a rolling walker at this time and is no longer receiving any physical therapy. She states about 2 weeks she fell out of her bed and struck her right upper arm.  She had some bruising that is resolving but otherwise no other injury.     The patient is a 77 year old Caucasian/White female who returns for a follow-up visit for her T2 diabetes. Since the last visit, she states she had 2 episodes of hypoglycemia where both values were 37.  She states it occurred after she ate around 5-6 PM in the evening.  She does not know if she was eating enough food.  She remains Glipizide ER 5mg  daily and Januvia 100mg  daily (Dr. Anabel [name] stopped both of these meds in 05/2022 until she could come see me today). She also used to be on Humulin R insulin which she took 3 times a day with  sliding scale about 6-7 units at a time.  She stopped this months ago as I told her she did not need this.  She is not walking as much as they would like.. She specifically denies unexplained abdominal pain, nausea or vomiting. She checks blood sugars at least 2 times per day and they tend to range somewhere between 150 and 250 mg/dl fasting.  Her last HgBa1c was done on 12/15/2021 and was 6.3%.   She came in fasting today in anticipation of lab work. She does have a history of diabetic neuropathy as well as diabetic nephropathy where her daughter has told me she has stage II CKD. She states that she has bilateral foot pain on the bottom and top of both feet. She was started on gabapentin 100mg  BID which she states does help. There is no diabetic retinopathy. She has cardiovascular disease with a MI in the past associated with her diabetes.  She had a dilated eye exam done in 03/31/2022 and there was no diabetic retinopathy.  When I saw [name] in 03/2022, I had her setup to go back to Dr. Anabel [name] to get started back on immunotherapy.  Over the interim, she has has resumed pembrolizumaba.  She saw Dr. Anabel [name] at the end of 05/2022 and they wanted a repeat CT  scan of her chest but she states that she went twice to have this done but the schedulers told her she was not on the schedule.  She has a note from Oncology that she did not want anymore appointments but she tells me she is going back to see [name] in the near future.  Again, she has a history of bronchogenic carcinoma which was diagnosed in 10/2020.  Her lung cancer first manifested as trouble in her right eye with pain.  MRI imaging was done in 09/2020 which revealed a hypeintense T1 signal lesions within the right cerebrum that was consistent with hemorrhagic metastases.  One lesion was in the right occipital lobe and one in the posterior right frontal lobe.  They also noted multiple lacunar infarctions of the basal ganglia.  A PET scan was  performed in 10/2020 that confirmed a hypermetabolic nodule in the right LL consistent with bronchogenic carcinoma.  There was no evidence of other metastatic disease was observed to brain.  She received 1 treatment of chemotherapy and stereotactic radiation to the 2 brain lesions.  This was completed in 11/2020.  She did have to struggle with chemotherapy and was hospitalized twice due to severe weakness.  She declined to pursue further chemotherapy and moved from [place] to [place] in 12/2020 to live with her daughter.  I did see her and referred her to [name] whom she saw on 03/11/2021.  She had a CT scan of her chest on 03/09/2021 which showed a right LL nodule measuring 2.0 x 1.9 cm representing the site of her known cancer.  There were findings suspicious for right infrahilar nodal involvement.  There were smaller nodular foci in the right LL suspicious for multifocal involvement.  There was also a 13 mm left adrenal nodule that was also noted from scans in 2018.  She also was noted to have a prior CABG and aortic atherosclerosis. They also did a repeat MRI of her brain on 03/21/2021 but I do not have those results.  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  She was started on palliative pembrolizumab in October states she remains on this.        Past Medical History Past Medical History:  Diagnosis Date   Arthritis    Asthma    CAD (coronary artery disease) 09/09/2010   S/p multiple PCIs to RCA, LAD, LCx // s/p inf STEMI 4/18 >> POBA to mRCA followed by CABG (L-LAD, S-D1, S-OM, S-PDA) // Intraoperative TEE 4/18: EF 50-55, no RWMA   Constipation 04/18/2021   Diabetes mellitus    Diabetic nephropathy (HCC)    stage II   Diabetic neuropathy (HCC)    GERD (gastroesophageal reflux disease)    History of MI (myocardial infarction) 05/2002   Hyperlipidemia 09/09/2010       Hypertension    Iron deficiency anemia due to chronic blood loss 03/17/2022    Lung cancer (HCC)    with brain metastases   Persistent atrial fibrillation (HCC) 10/01/2016   Post op AFib after CABG 4/18 // Amiodarone and Coumadin started // Amiodarone stopped 11/2016   Shoulder pain, right 04/18/2021   Stroke (HCC)    Weight loss 05/18/2021     Allergies Allergies  Allergen Reactions   Ergocalciferol Nausea And Vomiting     Medications  Current Outpatient Medications:    albuterol (VENTOLIN HFA) 108 (90 Base) MCG/ACT inhaler, Inhale 2 puffs into the lungs every 4 (four)  hours as needed., Disp: , Rfl:    ALPRAZolam (XANAX) 0.5 MG tablet, Take 1 tablet (0.5 mg total) by mouth 3 (three) times daily as needed for anxiety., Disp: 90 tablet, Rfl: 0   atorvastatin (LIPITOR) 80 MG tablet, Take 80 mg by mouth daily., Disp: , Rfl:    ATROVENT HFA 17 MCG/ACT inhaler, Inhale into the lungs., Disp: , Rfl:    benzonatate (TESSALON) 100 MG capsule, Take 100 mg by mouth 3 (three) times daily., Disp: , Rfl:    gabapentin (NEURONTIN) 100 MG capsule, Take by mouth., Disp: , Rfl:    glipiZIDE (GLUCOTROL XL) 5 MG 24 hr tablet, Take 5 mg by mouth daily., Disp: , Rfl:    JANUVIA 100 MG tablet, Take 100 mg by mouth daily., Disp: , Rfl:    losartan (COZAAR) 25 MG tablet, Take 25 mg by mouth daily., Disp: , Rfl:    metoprolol tartrate (LOPRESSOR) 25 MG tablet, Take 25 mg by mouth 2 (two) times daily., Disp: , Rfl:    mupirocin cream (BACTROBAN) 2 %, Apply topically., Disp: , Rfl:    ondansetron (ZOFRAN) 4 MG tablet, Take 1 tablet (4 mg total) by mouth every 4 (four) hours as needed for nausea., Disp: 90 tablet, Rfl: 3   Oxycodone HCl 10 MG TABS, Take 1 tablet (10 mg total) by mouth every 6 (six) hours as needed (for severe pain)., Disp: 60 tablet, Rfl: 0   pantoprazole (PROTONIX) 40 MG tablet, Take 1 tablet (40 mg total) by mouth 2 (two) times daily., Disp: 60 tablet, Rfl: 5   prochlorperazine (COMPAZINE) 10 MG tablet, Take 1 tablet (10 mg total) by mouth every 6 (six) hours as needed for  nausea or vomiting., Disp: 90 tablet, Rfl: 3   rivaroxaban (XARELTO) 20 MG TABS tablet, Take 1 tablet (20 mg total) by mouth daily with supper., Disp: 90 tablet, Rfl: 1   SIMBRINZA 1-0.2 % SUSP, Place 1 drop into the right eye 2 (two) times daily., Disp: , Rfl:    Review of Systems Review of Systems  Constitutional:  Negative for chills and fever.  Eyes:  Negative for blurred vision and double vision.  Respiratory:  Positive for cough and shortness of breath. Negative for hemoptysis and wheezing.   Cardiovascular:  Negative for chest pain, palpitations and leg swelling.  Gastrointestinal:  Negative for abdominal pain, constipation, diarrhea, nausea and vomiting.  Musculoskeletal:  Negative for myalgias.  Neurological:  Negative for dizziness, weakness and headaches.       Objective:    Vitals BP 138/84 (BP Location: Left Arm, Patient Position: Sitting, Cuff Size: Normal)   Pulse 69   Temp (!) 96.6 F (35.9 C) (Temporal)   Resp 18   Ht 5\' 7"  (1.702 m)   Wt 127 lb 9.6 oz (57.9 kg)   SpO2 99%   BMI 19.98 kg/m    Physical Examination Physical Exam Constitutional:      Appearance: Normal appearance. She is not ill-appearing.  Cardiovascular:     Rate and Rhythm: Normal rate and regular rhythm.     Pulses: Normal pulses.     Heart sounds: No murmur heard.    No friction rub. No gallop.  Pulmonary:     Effort: Pulmonary effort is normal. No respiratory distress.     Breath sounds: No wheezing, rhonchi or rales.  Abdominal:     General: Abdomen is flat. Bowel sounds are normal. There is no distension.     Palpations: Abdomen is soft.  Tenderness: There is no abdominal tenderness.  Musculoskeletal:     Right lower leg: No edema.     Left lower leg: No edema.  Skin:    General: Skin is warm and dry.     Findings: No rash.  Neurological:     General: No focal deficit present.     Mental Status: She is alert and oriented to person, place, and time.        Assessment  & Plan:   Lung cancer, lower lobe (HCC) I reviewed her notes from Oncology.  The patient states she is not going for a CT scan of her chest.  I asked her to discuss this with [name].  She tells me she went for the immunotherapy with last infusion on 07/03/2022.    Diabetic nephropathy associated with type 2 diabetes mellitus (HCC) I want her to hold the glimepiride as that is the probable culprit of her hypoglycemia especially if she is not eating well.  She can start back on farxiga 10mg  daily.  We will check a HgBA1c today.  Stage 2 chronic kidney disease She is to avoid nephrotoxins like NSAIDS.    Return in about 3 months (around 10/11/2022).   10/13/2022, MD

## 2022-07-12 NOTE — Assessment & Plan Note (Signed)
I reviewed her notes from Oncology.  The patient states she is not going for a CT scan of her chest.  I asked her to discuss this with Dr. Melida Quitter.  She tells me she went for the immunotherapy with last infusion on 07/03/2022.

## 2022-07-12 NOTE — Assessment & Plan Note (Addendum)
I want her to hold the glipizide as that is the probable culprit of her hypoglycemia especially if she is not eating well.  She can start back on Januvia 100mg  daily.  We will check a HgBA1c today.

## 2022-07-13 LAB — HEMOGLOBIN A1C
Est. average glucose Bld gHb Est-mCnc: 126 mg/dL
Hgb A1c MFr Bld: 6 % — ABNORMAL HIGH (ref 4.8–5.6)

## 2022-07-14 ENCOUNTER — Other Ambulatory Visit: Payer: Self-pay

## 2022-07-16 ENCOUNTER — Encounter: Payer: Self-pay | Admitting: Hematology and Oncology

## 2022-07-16 ENCOUNTER — Encounter: Payer: Self-pay | Admitting: Oncology

## 2022-07-18 ENCOUNTER — Encounter: Payer: Self-pay | Admitting: Oncology

## 2022-07-18 ENCOUNTER — Encounter: Payer: Self-pay | Admitting: Hematology and Oncology

## 2022-07-20 NOTE — Progress Notes (Signed)
Patient Care Team: Marco Collie, MD as PCP - General (Family Medicine) Sharmon Revere as Physician Assistant (Cardiology) Derwood Kaplan, MD as Consulting Physician (Oncology)  Clinic Day: 07/25/22  Referring physician: Townsend Roger, MD  ASSESSMENT & PLAN:   Assessment & Plan: Lung cancer, lower lobe [facility]) She received 1 dose of chemotherapy in June 2022, in [place], with combination cisplatin/Taxol/pembrolizumab, and tolerated this very poorly and ended up in the hospital twice. She is now receiving palliative pembrolizumab since October of 2022, and tolerating well. CT chest from January 2023 is stable to mildly improved.  The patient took a break and went to [place] for a time and so she missed some doses. We did resume treatment with pembrolizumab every 3 weeks.  She did finally agree to a port placement as she realizes this is long-term therapy. She had repeat scans in September of 2023, and all appears stable. She has been off treatment for a while but is willing to resume it since her latest scan of January 2024 is stable.    Brain metastases [facility]) Brain metastases diagnosed in April 2022.  This presented with right sided head pain and pain of the right eye.  She received stereotactic radiation to the two brain lesions. MRI brain from September 26th revealed four metastatic deposits in the brain. Several show mild associated hemorrhage. Mild edema. No mass-effect or midline shift. We will continue to monitor these. MRI brain from January revealed a mixed response with one new enhancing lesion in the right cerebellar hemisphere measuring 3 mm. One lesion in the left frontal lobe has resolved, one lesion in the right occipital lobe is stable, and two lesions are mildly decreased in size.  Repeat scan in September of 2023 shows resolution of the right cerebellar lesion and decreased size of the right occipital and bilateral frontal lesions. Repeat MRI of the brain in December,  2023 is stable.   Mild anemia Her hemoglobin was 12.3 in March, was down to 11.0, and down to 9.5 in September.  Iron studies revealed iron deficiency and she did not tolerate oral supplement so was given IV iron with good response. Her hemoglobin came up from 10.8 in November to 11.9 today. Her iron studies shows improvement of iron saturation from 10% to 15% and ferritin level from 9 to 153.  Osteopenia  This is obvious on the CT scan and she has had bilateral hip fractures, both occurring in 2023. I think she may need something for her bones. I recommended calcium with vitamin D to take twice daily if she can tolerate it. I will also schedule a bone density scan to see if she may need more than that.    Plan: Her most recent CT chest reveals a stable spiculated right lower lobe lung nodule. There are some smaller areas of nodularity elsewhere in the right lower lobe which are also stable, and a new subtle area of ill-defined nodular opacity in the left lower lobe at the left costophrenic angle. This very well could be of benign etiology and I think she needs a course of antibiotics. She does have a cough which is largely nonproductive, so we will go with a Z-pak.  When she gets her treatment, we will also give her 1 liter of IV fluids. We will repeat scans in 3 months. I will see her back in 3 weeks with CBC and CMP right before her next dose. I will schedule a bone density scan and she may  need something more than calcium for her bones. We can still provide palliative care for her. The patient understands the plans discussed today and is in agreement with them.  She knows to contact our office if she develops concerns prior to her next appointment.   I provided 30 minutes of face-to-face time during this this encounter and > 50% was spent counseling as documented under my assessment and plan.   Derwood Kaplan, MD  [place] [address] [place] [address] Dept: [phone] Dept Fax: [phone]      CHIEF COMPLAINT:  CC: A 77 year old female with history of lung cancer   Current Treatment:  Pembrolizumab  INTERVAL HISTORY:  Jaycee is here today for repeat clinical assessment and discuss CT scans.  Her most recent CT chest reveals stable spiculated right lower lobe lung nodule. There are some smaller areas of nodularity elsewhere in the right lower lobe which are also stable, and a new subtle area of ill-defined nodular opacity of the left lower lobe at the left costophrenic angle. This very well could be benign etiology. She has a stable left adrenal nodule. She does note that she has a cough, but non-productive. I have recommended she take a course of antibiotics(Z-pak) to help clear this. She notes that sometimes she can't get from one room to another. Her legs get tired and give out, she has had fractures of both hips last year. She will resume treatment with [facility] and will let us know if or when she does not wish to continue, and we can stop. She will see her PCP February 6th and will do routine labs. She states her bowels aren't moving well, she does take ex lax to help with this. I recommended she take Miralax daily to help with bowel movement. I also wrote down that she should take a multivitamin, also calcium with vitamin D, ideally twice daily. She also requested a refill for pain medications, I will put in an order for hydrocodone. She denies signs of infection such as sore throat or any urinary symptoms.  She denies fevers. She denies pain. She denies nausea, vomiting, chest pain, dyspnea. Her appetite is fair and her weight has remained stable.    ALLERGIES:  is allergic to ergocalciferol.  MEDICATIONS:  Current Outpatient Medications  Medication Sig Dispense Refill   albuterol (VENTOLIN HFA) 108 (90 Base) MCG/ACT inhaler Inhale 2 puffs into the lungs every 4 (four) hours as needed.      ALPRAZolam (XANAX) 0.5 MG tablet Take 1 tablet (0.5 mg total) by mouth 3 (three) times daily as needed for anxiety. 90 tablet 0   atorvastatin (LIPITOR) 80 MG tablet Take 80 mg by mouth daily.     ATROVENT HFA 17 MCG/ACT inhaler Inhale into the lungs.     azithromycin (ZITHROMAX Z-PAK) 250 MG tablet 2 pills today, then 1 pill daily 6 each 0   benzonatate (TESSALON) 100 MG capsule Take 100 mg by mouth 3 (three) times daily.     gabapentin (NEURONTIN) 100 MG capsule Take by mouth.     HYDROcodone-acetaminophen (NORCO) 5-325 MG tablet Take 1-2 tablets by mouth every 6 (six) hours as needed for moderate pain. 100 tablet 0   JANUVIA 100 MG tablet Take 100 mg by mouth daily.     losartan (COZAAR) 25 MG tablet Take 25 mg by mouth daily.     metoprolol tartrate (LOPRESSOR) 25 MG tablet Take 25  mg by mouth 2 (two) times daily.     mupirocin cream (BACTROBAN) 2 % Apply topically.     ondansetron (ZOFRAN) 4 MG tablet Take 1 tablet (4 mg total) by mouth every 4 (four) hours as needed for nausea. 90 tablet 3   pantoprazole (PROTONIX) 40 MG tablet Take 1 tablet (40 mg total) by mouth 2 (two) times daily. 60 tablet 5   prochlorperazine (COMPAZINE) 10 MG tablet Take 1 tablet (10 mg total) by mouth every 6 (six) hours as needed for nausea or vomiting. 90 tablet 3   rivaroxaban (XARELTO) 20 MG TABS tablet Take 1 tablet (20 mg total) by mouth daily with supper. 90 tablet 1   SIMBRINZA 1-0.2 % SUSP Place 1 drop into the right eye 2 (two) times daily.     No current facility-administered medications for this visit.    HISTORY OF PRESENT ILLNESS:   Oncology History  Lung cancer, lower lobe ([place])  07/07/2020 Initial Diagnosis   Lung cancer, lower lobe ([place])   10/15/2020 Cancer Staging   Staging form: Lung, AJCC 8th Edition - Clinical stage from 10/15/2020: Stage IV (cT1b, cN0, cM1) - Signed by Derwood Kaplan, MD on 03/31/2021 Histopathologic type: Squamous cell carcinoma, NOS Stage prefix: Initial  diagnosis Histologic grade (G): G3 Histologic grading system: 4 grade system Laterality: Right Tumor size (mm): 18 Sites of metastasis: Brain Lymph-vascular invasion (LVI): Presence of LVI unknown/indeterminate Diagnostic confirmation: Positive histology Specimen type: Core Needle Biopsy Staged by: Managing physician Type of lung cancer: Locally advanced or metastatic non-small cell lung cancer Stage used in treatment planning: Yes National guidelines used in treatment planning: Yes Type of national guideline used in treatment planning: NCCN   04/08/2021 - 02/07/2022 Chemotherapy   Patient is on Treatment Plan : LUNG NSCLC flat dose Pembrolizumab Q21D     04/08/2021 -  Chemotherapy   Patient is on Treatment Plan : LUNG NSCLC Pembrolizumab (200) q21d         REVIEW OF SYSTEMS:  Review of Systems  Constitutional:  Positive for appetite change and fatigue. Negative for chills, diaphoresis, fever and unexpected weight change.  HENT:  Negative.  Negative for hearing loss, lump/mass, mouth sores, nosebleeds, sore throat, tinnitus, trouble swallowing and voice change.   Eyes: Negative.  Negative for eye problems and icterus.  Respiratory:  Positive for cough. Negative for chest tightness, hemoptysis, shortness of breath and wheezing.   Cardiovascular: Negative.  Negative for chest pain, leg swelling and palpitations.  Gastrointestinal: Negative.  Negative for abdominal distention, abdominal pain, blood in stool, constipation, diarrhea, nausea, rectal pain and vomiting.  Endocrine: Negative.  Negative for hot flashes.       She has had several episodes of severe hypoglycemia.   Genitourinary: Negative.  Negative for bladder incontinence, difficulty urinating, dyspareunia, dysuria, frequency, hematuria, menstrual problem, nocturia, pelvic pain, vaginal bleeding and vaginal discharge.   Musculoskeletal:  Positive for arthralgias and back pain. Negative for flank pain, gait problem, myalgias,  neck pain and neck stiffness.  Skin: Negative.  Negative for itching, rash and wound.  Neurological: Negative.  Negative for dizziness, extremity weakness, gait problem, headaches, light-headedness, numbness, seizures and speech difficulty.  Hematological: Negative.  Negative for adenopathy. Does not bruise/bleed easily.  Psychiatric/Behavioral: Negative.  Negative for confusion, decreased concentration, depression, sleep disturbance and suicidal ideas. The patient is not nervous/anxious.      VITALS:  Blood pressure (!) 172/66, pulse (!) 58, temperature 97.7 F (36.5 C), temperature source Oral, resp. rate 20,  height 5\' 7"  (1.702 m), weight 126 lb 6.4 oz (57.3 kg), SpO2 99 %.  Wt Readings from Last 3 Encounters:  07/28/22 126 lb 1.3 oz (57.2 kg)  07/28/22 126 lb 9.6 oz (57.4 kg)  07/25/22 126 lb 6.4 oz (57.3 kg)    Body mass index is 19.8 kg/m.  Performance status (ECOG): 1 - Symptomatic but completely ambulatory  PHYSICAL EXAM:  Physical Exam Vitals and nursing note reviewed. Exam conducted with a chaperone present.  Constitutional:      General: She is not in acute distress.    Appearance: Normal appearance. She is well-developed. She is not toxic-appearing or diaphoretic.  HENT:     Head: Normocephalic and atraumatic.     Right Ear: Tympanic membrane, ear canal and external ear normal. There is no impacted cerumen.     Left Ear: Tympanic membrane, ear canal and external ear normal. There is no impacted cerumen.     Nose: Nose normal. No congestion or rhinorrhea.     Mouth/Throat:     Mouth: Mucous membranes are moist.     Pharynx: Oropharynx is clear. No oropharyngeal exudate or posterior oropharyngeal erythema.  Eyes:     General: Lids are normal. Vision grossly intact. No scleral icterus.       Right eye: No discharge.        Left eye: No discharge.     Extraocular Movements: Extraocular movements intact.     Conjunctiva/sclera: Conjunctivae normal.     Pupils: Pupils  are equal, round, and reactive to light.  Cardiovascular:     Rate and Rhythm: Normal rate and regular rhythm.     Pulses: Normal pulses.     Heart sounds: Normal heart sounds. No murmur heard.    No friction rub. No gallop.  Pulmonary:     Effort: Pulmonary effort is normal. No respiratory distress.     Breath sounds: No stridor. Rales present. No wheezing or rhonchi.     Comments: A few rales in the left base Chest:     Chest wall: No tenderness.  Abdominal:     General: Bowel sounds are normal. There is no distension.     Palpations: Abdomen is soft. There is no hepatomegaly, splenomegaly or mass.     Tenderness: There is no abdominal tenderness. There is no right CVA tenderness, left CVA tenderness, guarding or rebound.     Hernia: No hernia is present.  Musculoskeletal:        General: No swelling, tenderness, deformity or signs of injury. Normal range of motion.     Cervical back: Normal range of motion and neck supple. No rigidity or tenderness.     Right lower leg: No edema.     Left lower leg: No edema.     Comments: She has had one fall and hit her right face.  Lymphadenopathy:     Cervical: No cervical adenopathy.     Right cervical: No superficial, deep or posterior cervical adenopathy.    Left cervical: No superficial, deep or posterior cervical adenopathy.     Upper Body:     Right upper body: No supraclavicular, axillary or pectoral adenopathy.     Left upper body: No supraclavicular, axillary or pectoral adenopathy.  Skin:    General: Skin is warm and dry.     Coloration: Skin is pale. Skin is not jaundiced.     Findings: No bruising, erythema, lesion or rash.  Neurological:     General: No focal deficit  present.     Mental Status: She is alert and oriented to person, place, and time. Mental status is at baseline.     Cranial Nerves: No cranial nerve deficit.     Sensory: No sensory deficit.     Motor: Weakness present.     Coordination: Coordination normal.      Gait: Gait normal.     Deep Tendon Reflexes: Reflexes normal.     Comments: She has a right hemiparesis.  Psychiatric:        Mood and Affect: Mood normal.        Behavior: Behavior normal. Behavior is cooperative.        Thought Content: Thought content normal.        Judgment: Judgment normal.      LABORATORY DATA:  CMP    Component Value Date/Time   NA 140 07/28/2022 0000   K 3.5 07/28/2022 0000   CL 109 (A) 07/28/2022 0000   CO2 25 (A) 07/28/2022 0000   GLUCOSE 101 (H) 06/23/2022 1532   BUN 11 07/28/2022 0000   CREATININE 0.7 07/28/2022 0000   CREATININE 1.22 (H) 06/23/2022 1532   CALCIUM 8.5 (A) 07/28/2022 0000   PROT 7.0 06/23/2022 1532   ALBUMIN 3.6 07/28/2022 0000   AST 19 07/28/2022 1106   AST 38 06/23/2022 1532   ALT 13 07/28/2022 1106   ALT 28 06/23/2022 1532   ALKPHOS 71 07/28/2022 1106   BILITOT 0.6 06/23/2022 1532   GFRNONAA 46 (L) 06/23/2022 1532   GFRAA 59 (L) 10/20/2016 1618    No results found for: "SPEP", "UPEP" CBC Lab Results  Component Value Date   WBC 7.6 07/28/2022   NEUTROABS 4.10 07/28/2022   HGB 12.6 07/28/2022   HCT 37 07/28/2022   MCV 93.7 06/23/2022   PLT 208 07/28/2022      Chemistry      Component Value Date/Time   NA 140 07/28/2022 0000   K 3.5 07/28/2022 0000   CL 109 (A) 07/28/2022 0000   CO2 25 (A) 07/28/2022 0000   BUN 11 07/28/2022 0000   CREATININE 0.7 07/28/2022 0000   CREATININE 1.22 (H) 06/23/2022 1532   GLU 103 07/28/2022 0000      Component Value Date/Time   CALCIUM 8.5 (A) 07/28/2022 0000   ALKPHOS 71 07/28/2022 1106   AST 19 07/28/2022 1106   AST 38 06/23/2022 1532   ALT 13 07/28/2022 1106   ALT 28 06/23/2022 1532   BILITOT 0.6 06/23/2022 1532     Component Ref Range & Units 4 wk ago  Hgb A1c MFr Bld 4.8 - 5.6 % 5.8 High      RADIOGRAPHIC STUDIES: I have personally reviewed the radiological images as listed and agreed with the findings in the report. CLINICAL DATA: Lung cancer, metastatic to  brain EXAM:07/21/22 CT CHEST WITH CONTRAST IMPRESSION: Stable spiculated right lower lobe lung nodule. There is some smaller areas of nodularity elsewhere in the right loert lobe which are also stable. Recommend continued close surveillance in 3 months.  New subtle area of ill-defined nodular opacity left lower lobe the left costophrenic angle. This very well could be benign etiology but with the overall appearance recommend additional surveillance in 3 months,  Postop chest. Chest port  Stable left adrenal nodule.  Aortic Atherosclerosis and Emphysema.     EXAM: 03/14/22 MRI HEAD WITHOUT AND WITH CONTRAST  TECHNIQUE: Multiplanar, multiecho pulse sequences of the brain and surrounding structures were obtained without and with intravenous contrast.  CONTRAST: 107mL GADAVIST GADOBUTROL 1 MMOL/ML IV SOLN  COMPARISON: 11/07/2021  FINDINGS: Brain: No new enhancing lesion is seen. Redemonstrated 4 mm faintly peripherally enhancing lesion in the right occipital lobe (series 16, image 77), unchanged. Punctate focus of enhancement in the left frontal operculum (series 16, image 88), unchanged. Unchanged small area of cortical enhancement in the posterior right frontal lobe measuring up to 4 mm (series 16, image 113), unchanged. These lesions are associated with hemosiderin deposition.  No restricted diffusion to suggest acute or subacute infarct. No acute hemorrhage, mass effect, or midline shift. No hydrocephalus or extra-axial collection. 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 Confluent T2 hyperintense signal in the periventricular white matter, likely the sequela of moderate to severe chronic small vessel ischemic disease.  Vascular: Normal arterial flow voids. Normal arterial and venous enhancement.  Skull and upper cervical spine: Normal marrow signal.  Sinuses/Orbits: No acute finding. Left lens  replacement.  Other: Trace fluid in left mastoid air cells.  IMPRESSION: No new enhancing lesions. Unchanged appearance of previously noted right frontal, right occipital, and left frontal lesions.    EXAM: 11/07/21 MRI HEAD WITHOUT AND WITH CONTRAST  TECHNIQUE:  Multiplanar, multiecho pulse sequences of the brain and surrounding  structures were obtained without and with intravenous contrast.  COMPARISON: MRI of the brain June 29, 2021.  FINDINGS:  The study is degraded by motion.  Brain: No new enhancing lesion identified.  Inferior right cerebellar hemisphere lesion described on prior MRI  is no longer seen.  Interval decreased size of enhancing lesion in the right occipital  lobe, now measuring 4 mm compared to 6 mm on prior (series 11, image  106).  Punctate residual focus of contrast enhancement with hemosiderin  deposit in the left frontal operculum, now measuring 1 mm compared  to 2 mm on prior (series 11, image 122).  Cortical focus of residual contrast enhancement with hemosiderin  deposit in the posterior right frontal lobe, now measuring 4 mm  compared to 5 mm on prior (series 11, image 158).  No acute infarction, hemorrhage, hydrocephalus or extra-axial  collection. 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  Scattered and confluent foci of T2 hyperintensity are seen within  the white matter of the cerebral hemispheres are nonspecific, most  likely related to chronic small vessel ischemia.  Vascular: Normal flow voids.  Skull and upper cervical spine: Normal marrow signal. Sinuses/Orbits: Negative.  Other: None.  IMPRESSION:  Findings consistent with treatment response with resolution of right  cerebella lesion described on prior MRI and decrease in size of  right occipital and bilateral frontal lesions, as described above.  No new enhancing lesion identified.   Electronically Signed   By: Pedro Earls M.D.  On: 11/08/2021 15:25     EXAM: 11/07/21 CT CHEST WITHOUT CONTRAST  TECHNIQUE:  Multidetector CT imaging of the chest was performed following the  standard protocol without IV contrast.  RADIATION DOSE REDUCTION: This exam was performed according to the  departmental dose-optimization program which includes automated  exposure control, adjustment of the mA and/or kV according to  patient size and/or use of iterative reconstruction technique.  COMPARISON: 06/29/2021  FINDINGS:  Cardiovascular: Aortic atherosclerosis. Normal heart size. 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 No pericardial effusion.  Mediastinum/Nodes: No enlarged mediastinal, hilar, or axillary lymph  nodes. Thyroid  gland, trachea, and esophagus demonstrate no  significant findings.  Lungs/Pleura: Unchanged spiculated nodule of the superior segment  right lower lobe measuring 1.9 x 1.6 cm (series 301, image 62).  Multiple additional generally clustered, irregular nodules of the  right lower lobe are likewise unchanged, most notably a 0.7 cm  nodule of the peripheral subpleural right lower lobe (series 301,  image 71). Background of extensive, fine centrilobular nodularity  throughout, generally concentrated in the lung apices. No pleural  effusion or pneumothorax.  Upper Abdomen: No acute abnormality. Stable, benign fatty  attenuation left adrenal adenoma (series 2, image 117). Status post  cholecystectomy.  Musculoskeletal: No chest wall abnormality. No suspicious osseous  lesions identified.  IMPRESSION:  1. Unchanged spiculated nodule of the superior segment right lower  lobe measuring 1.9 x 1.6 cm.  2. Multiple additional generally clustered, irregular nodules of the  right lower lobe are likewise unchanged, measuring 0.7 cm and  smaller.  3. Background of extensive, fine centrilobular nodularity   throughout, concentrated in the lung apices, consistent with  smoking-related respiratory bronchiolitis.  4. Coronary artery disease.  5. Stable, benign fatty attenuation left adrenal adenoma. This  demonstrates internal macroscopic fatty attenuation and has been  stable greater than 5 years.  Aortic Atherosclerosis (ICD10-I70.0).   Electronically Signed  By: Delanna Ahmadi M.D.  On: 11/08/2021 15:09        I,Gabriella Ballesteros,acting as a scribe for Derwood Kaplan, MD.,have documented all relevant documentation on the behalf of Derwood Kaplan, MD,as directed by  Derwood Kaplan, MD while in the presence of Derwood Kaplan, MD.

## 2022-07-21 DIAGNOSIS — C3490 Malignant neoplasm of unspecified part of unspecified bronchus or lung: Secondary | ICD-10-CM | POA: Diagnosis not present

## 2022-07-21 DIAGNOSIS — C3431 Malignant neoplasm of lower lobe, right bronchus or lung: Secondary | ICD-10-CM | POA: Diagnosis not present

## 2022-07-21 DIAGNOSIS — R918 Other nonspecific abnormal finding of lung field: Secondary | ICD-10-CM | POA: Diagnosis not present

## 2022-07-24 NOTE — Progress Notes (Signed)
Patient called.  Patient aware.  Her diabetes is controlled.

## 2022-07-24 NOTE — Progress Notes (Signed)
Pt informed

## 2022-07-25 ENCOUNTER — Other Ambulatory Visit (INDEPENDENT_AMBULATORY_CARE_PROVIDER_SITE_OTHER): Payer: 59 | Admitting: Oncology

## 2022-07-25 ENCOUNTER — Other Ambulatory Visit: Payer: Self-pay | Admitting: Oncology

## 2022-07-25 ENCOUNTER — Telehealth: Payer: Self-pay | Admitting: Oncology

## 2022-07-25 ENCOUNTER — Encounter: Payer: Self-pay | Admitting: Oncology

## 2022-07-25 ENCOUNTER — Inpatient Hospital Stay: Payer: 59

## 2022-07-25 ENCOUNTER — Inpatient Hospital Stay: Payer: 59 | Attending: Oncology | Admitting: Oncology

## 2022-07-25 VITALS — BP 172/66 | HR 58 | Temp 97.7°F | Resp 20 | Ht 67.0 in | Wt 126.4 lb

## 2022-07-25 DIAGNOSIS — C3431 Malignant neoplasm of lower lobe, right bronchus or lung: Secondary | ICD-10-CM

## 2022-07-25 DIAGNOSIS — C7931 Secondary malignant neoplasm of brain: Secondary | ICD-10-CM

## 2022-07-25 DIAGNOSIS — Z780 Asymptomatic menopausal state: Secondary | ICD-10-CM

## 2022-07-25 DIAGNOSIS — J209 Acute bronchitis, unspecified: Secondary | ICD-10-CM

## 2022-07-25 DIAGNOSIS — D539 Nutritional anemia, unspecified: Secondary | ICD-10-CM

## 2022-07-25 MED ORDER — AZITHROMYCIN 250 MG PO TABS: ORAL_TABLET | ORAL | 0 refills | Status: AC

## 2022-07-25 MED ORDER — HYDROCODONE-ACETAMINOPHEN 5-325 MG PO TABS: 1.0000 | ORAL_TABLET | Freq: Four times a day (QID) | ORAL | 0 refills | Status: DC | PRN

## 2022-07-25 NOTE — Telephone Encounter (Signed)
07/25/22 Spoke with patient and scheduled Bone Density appt.Patient confirmed appt

## 2022-07-27 ENCOUNTER — Ambulatory Visit: Payer: 59

## 2022-07-27 ENCOUNTER — Other Ambulatory Visit: Payer: Self-pay | Admitting: Pharmacist

## 2022-07-27 DIAGNOSIS — C3431 Malignant neoplasm of lower lobe, right bronchus or lung: Secondary | ICD-10-CM

## 2022-07-27 MED FILL — Pembrolizumab IV Soln 100 MG/4ML (25 MG/ML): INTRAVENOUS | Qty: 8 | Status: AC

## 2022-07-28 ENCOUNTER — Inpatient Hospital Stay: Payer: 59

## 2022-07-28 ENCOUNTER — Inpatient Hospital Stay: Payer: 59 | Attending: Oncology

## 2022-07-28 VITALS — BP 167/84 | HR 61 | Temp 98.6°F | Resp 16 | Ht 67.0 in | Wt 126.1 lb

## 2022-07-28 DIAGNOSIS — C3431 Malignant neoplasm of lower lobe, right bronchus or lung: Secondary | ICD-10-CM | POA: Diagnosis not present

## 2022-07-28 DIAGNOSIS — D649 Anemia, unspecified: Secondary | ICD-10-CM | POA: Insufficient documentation

## 2022-07-28 DIAGNOSIS — Z5112 Encounter for antineoplastic immunotherapy: Secondary | ICD-10-CM | POA: Insufficient documentation

## 2022-07-28 DIAGNOSIS — M8580 Other specified disorders of bone density and structure, unspecified site: Secondary | ICD-10-CM | POA: Diagnosis not present

## 2022-07-28 DIAGNOSIS — C7931 Secondary malignant neoplasm of brain: Secondary | ICD-10-CM | POA: Diagnosis not present

## 2022-07-28 LAB — HEPATIC FUNCTION PANEL
ALT: 13 U/L (ref 7–35)
AST: 19 (ref 13–35)
Alkaline Phosphatase: 71 (ref 25–125)
Bilirubin, Total: 0.4

## 2022-07-28 LAB — CBC AND DIFFERENTIAL
HCT: 37 (ref 36–46)
Hemoglobin: 12.6 (ref 12.0–16.0)
Neutrophils Absolute: 4.1
Platelets: 208 10*3/uL (ref 150–400)
WBC: 7.6

## 2022-07-28 LAB — COMPREHENSIVE METABOLIC PANEL
Albumin: 3.6 (ref 3.5–5.0)
Calcium: 8.5 — AB (ref 8.7–10.7)
eGFR: >60

## 2022-07-28 LAB — BASIC METABOLIC PANEL
BUN: 11 (ref 4–21)
CO2: 25 — AB (ref 13–22)
Chloride: 109 — AB (ref 99–108)
Creatinine: 0.7 (ref 0.5–1.1)
Glucose: 103
Potassium: 3.5 mEq/L (ref 3.5–5.1)
Sodium: 140 (ref 137–147)

## 2022-07-28 LAB — CBC: RBC: 4.22 (ref 3.87–5.11)

## 2022-07-28 MED ORDER — SODIUM CHLORIDE 0.9 % IV SOLN
200.0000 mg | Freq: Once | INTRAVENOUS | Status: AC
  Administered 2022-07-28: 200 mg via INTRAVENOUS
  Filled 2022-07-28 (×2): qty 8

## 2022-07-28 MED ORDER — HEPARIN SOD (PORK) LOCK FLUSH 100 UNIT/ML IV SOLN
500.0000 [IU] | Freq: Once | INTRAVENOUS | Status: AC | PRN
  Administered 2022-07-28: 500 [IU]

## 2022-07-28 MED ORDER — SODIUM CHLORIDE 0.9 % IV SOLN: Freq: Once | INTRAVENOUS | Status: AC

## 2022-07-28 MED ORDER — SODIUM CHLORIDE 0.9% FLUSH
10.0000 mL | INTRAVENOUS | Status: DC | PRN
  Administered 2022-07-28: 10 mL

## 2022-07-28 NOTE — Patient Instructions (Signed)
[facility]  Discharge Instructions: Thank you for choosing [facility] to provide your oncology and hematology care.  If you have a lab appointment with the [facility], please go directly to the [facility] and check in at the registration area.   Wear comfortable clothing and clothing appropriate for easy access to any Portacath or PICC line.   We strive to give you quality time with your provider. You may need to reschedule your appointment if you arrive late (15 or more minutes).  Arriving late affects you and other patients whose appointments are after yours.  Also, if you miss three or more appointments without notifying the office, you may be dismissed from the clinic at the provider's discretion.      For prescription refill requests, have your pharmacy contact our office and allow 72 hours for refills to be completed.    Today you received the following chemotherapy and/or immunotherapy agents Beryle Flock      To help prevent nausea and vomiting after your treatment, we encourage you to take your nausea medication as directed.  BELOW ARE SYMPTOMS THAT SHOULD BE REPORTED IMMEDIATELY: *FEVER GREATER THAN 100.4 F (38 C) OR HIGHER *CHILLS OR SWEATING *NAUSEA AND VOMITING THAT IS NOT CONTROLLED WITH YOUR NAUSEA MEDICATION *UNUSUAL SHORTNESS OF BREATH *UNUSUAL BRUISING OR BLEEDING *URINARY PROBLEMS (pain or burning when urinating, or frequent urination) *BOWEL PROBLEMS (unusual diarrhea, constipation, pain near the anus) TENDERNESS IN MOUTH AND THROAT WITH OR WITHOUT PRESENCE OF ULCERS (sore throat, sores in mouth, or a toothache) UNUSUAL RASH, SWELLING OR PAIN  UNUSUAL VAGINAL DISCHARGE OR ITCHING   Items with * indicate a potential emergency and should be followed up as soon as possible or go to the Emergency Department if any problems should occur.  Please show the CHEMOTHERAPY ALERT CARD or IMMUNOTHERAPY ALERT CARD at check-in to the  Emergency Department and triage nurse.  Should you have questions after your visit or need to cancel or reschedule your appointment, please contact [facility]  Dept: [phone]  and follow the prompts.  Office hours are 8:00 a.m. to 4:30 p.m. Monday - Friday. Please note that voicemails left after 4:00 p.m. may not be returned until the following business day.  We are closed weekends and major holidays. You have access to a nurse at all times for urgent questions. Please call the main number to the clinic Dept: [phone] and follow the prompts.  For any non-urgent questions, you may also contact your provider using MyChart. We now offer e-Visits for anyone 1 and older to request care online for non-urgent symptoms. For details visit [email or web address].   Also download the MyChart app! Go to the app store, search "MyChart", open the app, select , and log in with your MyChart username and password.

## 2022-08-01 DIAGNOSIS — E1151 Type 2 diabetes mellitus with diabetic peripheral angiopathy without gangrene: Secondary | ICD-10-CM | POA: Diagnosis not present

## 2022-08-01 DIAGNOSIS — C7931 Secondary malignant neoplasm of brain: Secondary | ICD-10-CM | POA: Diagnosis not present

## 2022-08-01 DIAGNOSIS — Z7689 Persons encountering health services in other specified circumstances: Secondary | ICD-10-CM | POA: Diagnosis not present

## 2022-08-01 DIAGNOSIS — C3490 Malignant neoplasm of unspecified part of unspecified bronchus or lung: Secondary | ICD-10-CM | POA: Diagnosis not present

## 2022-08-01 DIAGNOSIS — Z6820 Body mass index (BMI) 20.0-20.9, adult: Secondary | ICD-10-CM | POA: Diagnosis not present

## 2022-08-01 DIAGNOSIS — Z9181 History of falling: Secondary | ICD-10-CM | POA: Diagnosis not present

## 2022-08-01 DIAGNOSIS — C7951 Secondary malignant neoplasm of bone: Secondary | ICD-10-CM | POA: Diagnosis not present

## 2022-08-03 DIAGNOSIS — M8589 Other specified disorders of bone density and structure, multiple sites: Secondary | ICD-10-CM | POA: Diagnosis not present

## 2022-08-07 ENCOUNTER — Encounter: Payer: Self-pay | Admitting: Hematology and Oncology

## 2022-08-07 ENCOUNTER — Encounter: Payer: Self-pay | Admitting: Oncology

## 2022-08-10 ENCOUNTER — Other Ambulatory Visit: Payer: Self-pay | Admitting: Internal Medicine

## 2022-08-15 ENCOUNTER — Telehealth: Payer: Self-pay

## 2022-08-15 ENCOUNTER — Encounter: Payer: Self-pay | Admitting: Oncology

## 2022-08-15 ENCOUNTER — Other Ambulatory Visit: Payer: Self-pay | Admitting: Oncology

## 2022-08-15 ENCOUNTER — Inpatient Hospital Stay: Payer: 59

## 2022-08-15 ENCOUNTER — Inpatient Hospital Stay (INDEPENDENT_AMBULATORY_CARE_PROVIDER_SITE_OTHER): Payer: 59 | Admitting: Oncology

## 2022-08-15 ENCOUNTER — Telehealth: Payer: Self-pay | Admitting: Oncology

## 2022-08-15 VITALS — BP 141/73 | HR 60 | Temp 97.9°F | Resp 19 | Ht 67.0 in | Wt 124.7 lb

## 2022-08-15 DIAGNOSIS — Z780 Asymptomatic menopausal state: Secondary | ICD-10-CM | POA: Diagnosis not present

## 2022-08-15 DIAGNOSIS — C3431 Malignant neoplasm of lower lobe, right bronchus or lung: Secondary | ICD-10-CM

## 2022-08-15 DIAGNOSIS — D500 Iron deficiency anemia secondary to blood loss (chronic): Secondary | ICD-10-CM

## 2022-08-15 DIAGNOSIS — Z5112 Encounter for antineoplastic immunotherapy: Secondary | ICD-10-CM | POA: Diagnosis not present

## 2022-08-15 DIAGNOSIS — M8580 Other specified disorders of bone density and structure, unspecified site: Secondary | ICD-10-CM | POA: Insufficient documentation

## 2022-08-15 DIAGNOSIS — D649 Anemia, unspecified: Secondary | ICD-10-CM | POA: Diagnosis not present

## 2022-08-15 DIAGNOSIS — C7931 Secondary malignant neoplasm of brain: Secondary | ICD-10-CM | POA: Diagnosis not present

## 2022-08-15 LAB — CMP (CANCER CENTER ONLY)
ALT: 12 U/L (ref 0–44)
AST: 20 U/L (ref 15–41)
Albumin: 4.4 g/dL (ref 3.5–5.0)
Alkaline Phosphatase: 72 U/L (ref 38–126)
Anion gap: 11 (ref 5–15)
BUN: 17 mg/dL (ref 8–23)
CO2: 27 mmol/L (ref 22–32)
Calcium: 9.7 mg/dL (ref 8.9–10.3)
Chloride: 103 mmol/L (ref 98–111)
Creatinine: 0.93 mg/dL (ref 0.44–1.00)
GFR, Estimated: >60 mL/min (ref >60)
Glucose, Bld: 124 mg/dL — ABNORMAL HIGH (ref 70–99)
Potassium: 4.1 mmol/L (ref 3.5–5.1)
Sodium: 141 mmol/L (ref 135–145)
Total Bilirubin: 0.7 mg/dL (ref 0.3–1.2)
Total Protein: 7.4 g/dL (ref 6.5–8.1)

## 2022-08-15 LAB — CBC WITH DIFFERENTIAL (CANCER CENTER ONLY)
Abs Immature Granulocytes: 0.02 10*3/uL (ref 0.00–0.07)
Basophils Absolute: 0.1 10*3/uL (ref 0.0–0.1)
Basophils Relative: 1 %
Eosinophils Absolute: 0.2 10*3/uL (ref 0.0–0.5)
Eosinophils Relative: 2 %
HCT: 42.6 % (ref 36.0–46.0)
Hemoglobin: 13.8 g/dL (ref 12.0–15.0)
Immature Granulocytes: 0 %
Lymphocytes Relative: 43 %
Lymphs Abs: 3.4 10*3/uL (ref 0.7–4.0)
MCH: 29.6 pg (ref 26.0–34.0)
MCHC: 32.4 g/dL (ref 30.0–36.0)
MCV: 91.4 fL (ref 80.0–100.0)
Monocytes Absolute: 0.5 10*3/uL (ref 0.1–1.0)
Monocytes Relative: 6 %
Neutro Abs: 3.7 10*3/uL (ref 1.7–7.7)
Neutrophils Relative %: 48 %
Platelet Count: 188 10*3/uL (ref 150–400)
RBC: 4.66 MIL/uL (ref 3.87–5.11)
RDW: 13.1 % (ref 11.5–15.5)
WBC Count: 7.9 10*3/uL (ref 4.0–10.5)
nRBC: 0 % (ref 0.0–0.2)

## 2022-08-15 NOTE — Telephone Encounter (Signed)
-----   Message from Derwood Kaplan, MD sent at 08/15/2022 11:14 AM EST ----- Regarding: refer REF GI for iron def anemia and dysphagia in a pt with stable lung cancer with brain mets

## 2022-08-15 NOTE — Telephone Encounter (Signed)
Referral sent to Dr. Crisoforo [facility] office

## 2022-08-15 NOTE — Telephone Encounter (Signed)
Patient has been scheduled for follow-up visit per 08/15/22 LOS.  Pt given an appt calendar with date and time.

## 2022-08-15 NOTE — Progress Notes (Signed)
Patient Care Team: Marco Collie, MD as PCP - General (Family Medicine) Sharmon Revere as Physician Assistant (Cardiology) Derwood Kaplan, MD as Consulting Physician (Oncology)  Clinic Day: 08/15/22   Referring physician: Townsend Roger, MD  ASSESSMENT & PLAN:   Assessment & Plan: Lung cancer, lower lobe [facility]) She received 1 dose of chemotherapy in June 2022, in [place], with combination cisplatin/Taxol/pembrolizumab, and tolerated this very poorly and ended up in the hospital twice. She is now receiving palliative pembrolizumab since October of 2022, and tolerating it well. CT chest from January 2023 is stable to mildly improved.  The patient took a break and went to [place] for a time and so she missed some doses. We did resume treatment with pembrolizumab every 3 weeks.  She did agree to a port placement as she realizes this is long-term therapy. She had repeat scans in September of 2023, and all appears stable. She has been off treatment for a while but was willing to resume it since her latest scan of January 2024 is stable.  He now prefers to stop treatment for now as her quality of life is poor.   Brain metastases [facility]) Brain metastases diagnosed in April 2022.  This presented with right sided head pain and pain of the right eye.  She received stereotactic radiation to the two brain lesions. MRI brain from September 26th revealed four metastatic deposits in the brain. Several show mild associated hemorrhage. Mild edema. No mass-effect or midline shift. We will continue to monitor these. MRI brain from January revealed a mixed response with one new enhancing lesion in the right cerebellar hemisphere measuring 3 mm. One lesion in the left frontal lobe has resolved, one lesion in the right occipital lobe is stable, and two lesions are mildly decreased in size.  Repeat scan in September of 2023 shows resolution of the right cerebellar lesion and decreased size of the right  occipital and bilateral frontal lesions. Repeat MRI of the brain in December, 2023 is stable.   Mild anemia Her hemoglobin was 12.3 in March, was down to 11.0, and down to 9.5 in September.  Iron studies revealed iron deficiency and she did not tolerate oral supplement so was given IV iron with good response. Her hemoglobin came up from 10.8 in November to 11.9 today. Her iron studies shows improvement of iron saturation from 10% to 15% and ferritin level from 9 to 153.  Osteopenia  This is obvious on the CT scan and she has had bilateral hip fractures, both occurring in 2023. I think she may need something for her bones. I recommended calcium with vitamin D to take twice daily if she can tolerate it.  A bone density scan was done this month and confirms osteopenia.  I will forward a copy to Dr. Marco Collie to see what she recommends beyond calcium and vitamin D.   Plan: I will see her back in 6 weeks with CBC and CMP. She has requested stopping treatment for 3 months and repeat scans to see if she has any improvements in her quality of life.I have recommended that she see a gastroenterologist and will be glad to make the referral. We will need to flush her port every 3 months.We can still provide palliative care for her, in conjunction with Dr. Nyra Capes.  I will check a TSH to make sure this is not a thyroid problem.  The patient understands the plans discussed today and is in agreement with them.  She  knows to contact our office if she develops concerns prior to her next appointment.   I provided 30 minutes of face-to-face time during this this encounter and > 50% was spent counseling as documented under my assessment and plan.   Derwood Kaplan, MD  [place] [address] [place] [address] Dept: [phone] Dept Fax: [phone]      CHIEF COMPLAINT:  CC: A 77 year old female with history of lung cancer    Current Treatment:  Pembrolizumab  INTERVAL HISTORY:  Annecy is here today for repeat clinical assessment. She states she has no energy or strength. She feels tired all the time and notes she is weak. Her daughter notes that she also may be frustrated since she cannot get around as she used to, especially with her bilateral hip fractures last year. Her hemoglobin has improved significantly, last reading from 07/28/22 was 12.6. She does note that she has a cough especially in the morning, and notes that sometimes her food doesn't go down all the way. She also notes that she has a swallowing problem sometimes. I have recommended that she see a gastroenterologist. She takes pain medication once a day at night. We will add a thyroid test to today's blood. She did request stopping treatment for 2-3 months and repeat scans to see if she has any improvements.  We will therefore stop her treatment and see her back in 6 weeks with CBC and CMP to see how she is getting along.  We will then plan to repeat scans 1 to 2 months after that appointment.  She will still need to have her port flushed every 3 months.  She denies signs of infection such as sore throat or any urinary symptoms.  She denies fevers. She denies pain. She denies nausea, vomiting, chest pain, dyspnea. Her appetite is fair and her weight has dropped 2 pounds since her last visit.   ALLERGIES:  is allergic to ergocalciferol.  MEDICATIONS:  Current Outpatient Medications  Medication Sig Dispense Refill   albuterol (VENTOLIN HFA) 108 (90 Base) MCG/ACT inhaler Inhale 2 puffs into the lungs every 4 (four) hours as needed.     ALPRAZolam (XANAX) 0.5 MG tablet Take 1 tablet (0.5 mg total) by mouth 3 (three) times daily as needed for anxiety. 90 tablet 0   atorvastatin (LIPITOR) 80 MG tablet Take 80 mg by mouth daily.     ATROVENT HFA 17 MCG/ACT inhaler Inhale into the lungs.     azithromycin (ZITHROMAX Z-PAK) 250 MG tablet 2 pills today, then 1 pill  daily 6 each 0   benzonatate (TESSALON) 100 MG capsule Take 100 mg by mouth 3 (three) times daily.     gabapentin (NEURONTIN) 100 MG capsule TAKE 1 CAPSULE BY MOUTH TWICE A DAY 60 capsule 4   HYDROcodone-acetaminophen (NORCO) 5-325 MG tablet Take 1-2 tablets by mouth every 6 (six) hours as needed for moderate pain. 100 tablet 0   JANUVIA 100 MG tablet Take 100 mg by mouth daily.     losartan (COZAAR) 25 MG tablet Take 25 mg by mouth daily.     metoprolol tartrate (LOPRESSOR) 25 MG tablet Take 25 mg by mouth 2 (two) times daily.     mupirocin cream (BACTROBAN) 2 % Apply topically.     ondansetron (ZOFRAN) 4 MG tablet Take 1 tablet (4 mg total) by mouth every 4 (four) hours as needed for nausea. 90 tablet 3   pantoprazole (  PROTONIX) 40 MG tablet Take 1 tablet (40 mg total) by mouth 2 (two) times daily. 60 tablet 5   prochlorperazine (COMPAZINE) 10 MG tablet Take 1 tablet (10 mg total) by mouth every 6 (six) hours as needed for nausea or vomiting. 90 tablet 3   rivaroxaban (XARELTO) 20 MG TABS tablet Take 1 tablet (20 mg total) by mouth daily with supper. 90 tablet 1   SIMBRINZA 1-0.2 % SUSP Place 1 drop into the right eye 2 (two) times daily.     No current facility-administered medications for this visit.    HISTORY OF PRESENT ILLNESS:   Oncology History  Lung cancer, lower lobe ([place])  07/07/2020 Initial Diagnosis   Lung cancer, lower lobe ([place])   10/15/2020 Cancer Staging   Staging form: Lung, AJCC 8th Edition - Clinical stage from 10/15/2020: Stage IV (cT1b, cN0, cM1) - Signed by Derwood Kaplan, MD on 03/31/2021 Histopathologic type: Squamous cell carcinoma, NOS Stage prefix: Initial diagnosis Histologic grade (G): G3 Histologic grading system: 4 grade system Laterality: Right Tumor size (mm): 18 Sites of metastasis: Brain Lymph-vascular invasion (LVI): Presence of LVI unknown/indeterminate Diagnostic confirmation: Positive histology Specimen type: Core Needle Biopsy Staged  by: Managing physician Type of lung cancer: Locally advanced or metastatic non-small cell lung cancer Stage used in treatment planning: Yes National guidelines used in treatment planning: Yes Type of national guideline used in treatment planning: NCCN   04/08/2021 - 02/07/2022 Chemotherapy   Patient is on Treatment Plan : LUNG NSCLC flat dose Pembrolizumab Q21D     04/08/2021 -  Chemotherapy   Patient is on Treatment Plan : LUNG NSCLC Pembrolizumab (200) q21d         REVIEW OF SYSTEMS:  Review of Systems  Constitutional:  Positive for appetite change and fatigue. Negative for chills, diaphoresis, fever and unexpected weight change.  HENT:  Negative.  Negative for hearing loss, lump/mass, mouth sores, nosebleeds, sore throat, tinnitus, trouble swallowing and voice change.   Eyes: Negative.  Negative for eye problems and icterus.  Respiratory:  Positive for cough. Negative for chest tightness, hemoptysis, shortness of breath and wheezing.   Cardiovascular: Negative.  Negative for chest pain, leg swelling and palpitations.  Gastrointestinal: Negative.  Negative for abdominal distention, abdominal pain, blood in stool, constipation, diarrhea, nausea, rectal pain and vomiting.  Endocrine: Negative.  Negative for hot flashes.       She has had several episodes of severe hypoglycemia.   Genitourinary: Negative.  Negative for bladder incontinence, difficulty urinating, dyspareunia, dysuria, frequency, hematuria, menstrual problem, nocturia, pelvic pain, vaginal bleeding and vaginal discharge.   Musculoskeletal:  Positive for arthralgias and back pain. Negative for flank pain, gait problem, myalgias, neck pain and neck stiffness.  Skin: Negative.  Negative for itching, rash and wound.  Neurological: Negative.  Negative for dizziness, extremity weakness, gait problem, headaches, light-headedness, numbness, seizures and speech difficulty.  Hematological: Negative.  Negative for adenopathy. Does not  bruise/bleed easily.  Psychiatric/Behavioral: Negative.  Negative for confusion, decreased concentration, depression, sleep disturbance and suicidal ideas. The patient is not nervous/anxious.      VITALS:  Blood pressure (!) 141/73, pulse 60, temperature 97.9 F (36.6 C), temperature source Oral, resp. rate 19, height '5\' 7"'$  (1.702 m), weight 124 lb 11.2 oz (56.6 kg), SpO2 98 %.  Wt Readings from Last 3 Encounters:  08/15/22 124 lb 11.2 oz (56.6 kg)  07/28/22 126 lb 1.3 oz (57.2 kg)  07/28/22 126 lb 9.6 oz (57.4 kg)    Body  mass index is 19.53 kg/m.  Performance status (ECOG): 1 - Symptomatic but completely ambulatory  PHYSICAL EXAM:  Physical Exam Vitals and nursing note reviewed. Exam conducted with a chaperone present.  Constitutional:      General: She is not in acute distress.    Appearance: Normal appearance. She is well-developed. She is not toxic-appearing or diaphoretic.  HENT:     Head: Normocephalic and atraumatic.     Right Ear: Tympanic membrane, ear canal and external ear normal. There is no impacted cerumen.     Left Ear: Tympanic membrane, ear canal and external ear normal. There is no impacted cerumen.     Nose: Nose normal. No congestion or rhinorrhea.     Mouth/Throat:     Mouth: Mucous membranes are moist.     Pharynx: Oropharynx is clear. No oropharyngeal exudate or posterior oropharyngeal erythema.  Eyes:     General: Lids are normal. Vision grossly intact. No scleral icterus.       Right eye: No discharge.        Left eye: No discharge.     Extraocular Movements: Extraocular movements intact.     Conjunctiva/sclera: Conjunctivae normal.     Pupils: Pupils are equal, round, and reactive to light.  Cardiovascular:     Rate and Rhythm: Normal rate and regular rhythm.     Pulses: Normal pulses.     Heart sounds: Normal heart sounds. No murmur heard.    No friction rub. No gallop.  Pulmonary:     Effort: Pulmonary effort is normal. No respiratory  distress.     Breath sounds: No stridor. Rales present. No wheezing or rhonchi.     Comments: A few rales in the left base Chest:     Chest wall: No tenderness.  Abdominal:     General: Bowel sounds are normal. There is no distension.     Palpations: Abdomen is soft. There is no hepatomegaly, splenomegaly or mass.     Tenderness: There is no abdominal tenderness. There is no right CVA tenderness, left CVA tenderness, guarding or rebound.     Hernia: No hernia is present.  Musculoskeletal:        General: No swelling, tenderness, deformity or signs of injury. Normal range of motion.     Cervical back: Normal range of motion and neck supple. No rigidity or tenderness.     Right lower leg: No edema.     Left lower leg: No edema.     Comments: She has had one fall and hit her right face.  Lymphadenopathy:     Cervical: No cervical adenopathy.     Right cervical: No superficial, deep or posterior cervical adenopathy.    Left cervical: No superficial, deep or posterior cervical adenopathy.     Upper Body:     Right upper body: No supraclavicular, axillary or pectoral adenopathy.     Left upper body: No supraclavicular, axillary or pectoral adenopathy.  Skin:    General: Skin is warm and dry.     Coloration: Skin is pale. Skin is not jaundiced.     Findings: No bruising, erythema, lesion or rash.  Neurological:     General: No focal deficit present.     Mental Status: She is alert and oriented to person, place, and time. Mental status is at baseline.     Cranial Nerves: No cranial nerve deficit.     Sensory: No sensory deficit.     Motor: Weakness present.  Coordination: Coordination normal.     Gait: Gait normal.     Deep Tendon Reflexes: Reflexes normal.     Comments: She has a right hemiparesis.  Psychiatric:        Mood and Affect: Mood normal.        Behavior: Behavior normal. Behavior is cooperative.        Thought Content: Thought content normal.        Judgment: Judgment  normal.      LABORATORY DATA:  CMP    Component Value Date/Time   NA 141 08/15/2022 1005   NA 140 07/28/2022 0000   K 4.1 08/15/2022 1005   CL 103 08/15/2022 1005   CO2 27 08/15/2022 1005   GLUCOSE 124 (H) 08/15/2022 1005   BUN 17 08/15/2022 1005   BUN 11 07/28/2022 0000   CREATININE 0.93 08/15/2022 1005   CALCIUM 9.7 08/15/2022 1005   PROT 7.4 08/15/2022 1005   ALBUMIN 4.4 08/15/2022 1005   AST 20 08/15/2022 1005   ALT 12 08/15/2022 1005   ALKPHOS 72 08/15/2022 1005   BILITOT 0.7 08/15/2022 1005   GFRNONAA >60 08/15/2022 1005   GFRAA 59 (L) 10/20/2016 1618    No results found for: "SPEP", "UPEP" CBC Lab Results  Component Value Date   WBC 7.9 08/15/2022   NEUTROABS 3.7 08/15/2022   HGB 13.8 08/15/2022   HCT 42.6 08/15/2022   MCV 91.4 08/15/2022   PLT 188 08/15/2022      Chemistry      Component Value Date/Time   NA 141 08/15/2022 1005   NA 140 07/28/2022 0000   K 4.1 08/15/2022 1005   CL 103 08/15/2022 1005   CO2 27 08/15/2022 1005   BUN 17 08/15/2022 1005   BUN 11 07/28/2022 0000   CREATININE 0.93 08/15/2022 1005   GLU 103 07/28/2022 0000      Component Value Date/Time   CALCIUM 9.7 08/15/2022 1005   ALKPHOS 72 08/15/2022 1005   AST 20 08/15/2022 1005   ALT 12 08/15/2022 1005   BILITOT 0.7 08/15/2022 1005     Component Ref Range & Units 4 wk ago  Hgb A1c MFr Bld 4.8 - 5.6 % 5.8 High      RADIOGRAPHIC STUDIES: I have personally reviewed the radiological images as listed and agreed with the findings in the report. CLINICAL DATA: Lung cancer, metastatic to brain EXAM:07/21/22 CT CHEST WITH CONTRAST IMPRESSION: Stable spiculated right lower lobe lung nodule. There is some smaller areas of nodularity elsewhere in the right loert lobe which are also stable. Recommend continued close surveillance in 3 months.  New subtle area of ill-defined nodular opacity left lower lobe the left costophrenic angle. This very well could be benign etiology but with  the overall appearance recommend additional surveillance in 3 months,  Postop chest. Chest port  Stable left adrenal nodule.  Aortic Atherosclerosis and Emphysema.     EXAM: 03/14/22 MRI HEAD WITHOUT AND WITH CONTRAST  TECHNIQUE: Multiplanar, multiecho pulse sequences of the brain and surrounding structures were obtained without and with intravenous contrast.  CONTRAST: 91m GADAVIST GADOBUTROL 1 MMOL/ML IV SOLN  COMPARISON: 11/07/2021  FINDINGS: Brain: No new enhancing lesion is seen. Redemonstrated 4 mm faintly peripherally enhancing lesion in the right occipital lobe (series 16, image 77), unchanged. Punctate focus of enhancement in the left frontal operculum (series 16, image 88), unchanged. Unchanged small area of cortical enhancement in the posterior right frontal lobe measuring up to 4 mm (series 16, image 113),  unchanged. These lesions are associated with hemosiderin deposition.  No restricted diffusion to suggest acute or subacute infarct. No acute hemorrhage, mass effect, or midline shift. No hydrocephalus or extra-axial collection. 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 Confluent T2 hyperintense signal in the periventricular white matter, likely the sequela of moderate to severe chronic small vessel ischemic disease.  Vascular: Normal arterial flow voids. Normal arterial and venous enhancement.  Skull and upper cervical spine: Normal marrow signal.  Sinuses/Orbits: No acute finding. Left lens replacement.  Other: Trace fluid in left mastoid air cells.  IMPRESSION: No new enhancing lesions. Unchanged appearance of previously noted right frontal, right occipital, and left frontal lesions.    EXAM: 11/07/21 MRI HEAD WITHOUT AND WITH CONTRAST  TECHNIQUE:  Multiplanar, multiecho pulse sequences of the brain and surrounding  structures were obtained without and with intravenous  contrast.  COMPARISON: MRI of the brain June 29, 2021.  FINDINGS:  The study is degraded by motion.  Brain: No new enhancing lesion identified.  Inferior right cerebellar hemisphere lesion described on prior MRI  is no longer seen.  Interval decreased size of enhancing lesion in the right occipital  lobe, now measuring 4 mm compared to 6 mm on prior (series 11, image  106).  Punctate residual focus of contrast enhancement with hemosiderin  deposit in the left frontal operculum, now measuring 1 mm compared  to 2 mm on prior (series 11, image 122).  Cortical focus of residual contrast enhancement with hemosiderin  deposit in the posterior right frontal lobe, now measuring 4 mm  compared to 5 mm on prior (series 11, image 158).  No acute infarction, hemorrhage, hydrocephalus or extra-axial  collection. 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  Scattered and confluent foci of T2 hyperintensity are seen within  the white matter of the cerebral hemispheres are nonspecific, most  likely related to chronic small vessel ischemia.  Vascular: Normal flow voids.  Skull and upper cervical spine: Normal marrow signal. Sinuses/Orbits: Negative.  Other: None.  IMPRESSION:  Findings consistent with treatment response with resolution of right  cerebella lesion described on prior MRI and decrease in size of  right occipital and bilateral frontal lesions, as described above.  No new enhancing lesion identified.   Electronically Signed  By: Pedro Earls M.D.  On: 11/08/2021 15:25     EXAM: 11/07/21 CT CHEST WITHOUT CONTRAST  TECHNIQUE:  Multidetector CT imaging of the chest was performed following the  standard protocol without IV contrast.  RADIATION DOSE REDUCTION: This exam was performed according to the  departmental dose-optimization program which includes automated  exposure control,  adjustment of the mA and/or kV according to  patient size and/or use of iterative reconstruction technique.  COMPARISON: 06/29/2021  FINDINGS:  Cardiovascular: Aortic atherosclerosis. Normal heart size. 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 No pericardial effusion.  Mediastinum/Nodes: No enlarged mediastinal, hilar, or axillary lymph  nodes. Thyroid gland, trachea, and esophagus demonstrate no  significant findings.  Lungs/Pleura: Unchanged spiculated nodule of the superior segment  right lower lobe measuring 1.9 x 1.6 cm (series 301, image 62).  Multiple additional generally clustered, irregular nodules of the  right lower lobe are likewise unchanged, most notably a 0.7 cm  nodule of the peripheral subpleural right lower lobe (series 301,  image 71). Background of extensive, fine centrilobular nodularity  throughout, generally concentrated in the lung apices. No pleural  effusion or pneumothorax.  Upper Abdomen: No acute abnormality. Stable, benign fatty  attenuation left adrenal adenoma (series 2, image 117). Status post  cholecystectomy.  Musculoskeletal: No chest wall abnormality. No suspicious osseous  lesions identified.  IMPRESSION:  1. Unchanged spiculated nodule of the superior segment right lower  lobe measuring 1.9 x 1.6 cm.  2. Multiple additional generally clustered, irregular nodules of the  right lower lobe are likewise unchanged, measuring 0.7 cm and  smaller.  3. Background of extensive, fine centrilobular nodularity  throughout, concentrated in the lung apices, consistent with  smoking-related respiratory bronchiolitis.  4. Coronary artery disease.  5. Stable, benign fatty attenuation left adrenal adenoma. This  demonstrates internal macroscopic fatty attenuation and has been  stable greater than 5 years.  Aortic Atherosclerosis (ICD10-I70.0).   Electronically Signed  By: Delanna Ahmadi M.D.   On: 11/08/2021 15:09        I,Gabriella Ballesteros,acting as a scribe for Derwood Kaplan, MD.,have documented all relevant documentation on the behalf of Derwood Kaplan, MD,as directed by  Derwood Kaplan, MD while in the presence of Derwood Kaplan, MD.

## 2022-08-17 ENCOUNTER — Ambulatory Visit: Payer: 59

## 2022-08-18 ENCOUNTER — Ambulatory Visit: Payer: 59

## 2022-08-24 ENCOUNTER — Ambulatory Visit: Payer: 59 | Admitting: Internal Medicine

## 2022-08-28 ENCOUNTER — Encounter: Payer: Self-pay | Admitting: Hematology and Oncology

## 2022-08-28 ENCOUNTER — Encounter: Payer: Self-pay | Admitting: Oncology

## 2022-08-29 DIAGNOSIS — J309 Allergic rhinitis, unspecified: Secondary | ICD-10-CM | POA: Diagnosis not present

## 2022-08-29 DIAGNOSIS — Z789 Other specified health status: Secondary | ICD-10-CM | POA: Diagnosis not present

## 2022-08-29 DIAGNOSIS — Z131 Encounter for screening for diabetes mellitus: Secondary | ICD-10-CM | POA: Diagnosis not present

## 2022-08-29 DIAGNOSIS — Z6820 Body mass index (BMI) 20.0-20.9, adult: Secondary | ICD-10-CM | POA: Diagnosis not present

## 2022-08-29 DIAGNOSIS — K219 Gastro-esophageal reflux disease without esophagitis: Secondary | ICD-10-CM | POA: Diagnosis not present

## 2022-08-31 ENCOUNTER — Telehealth: Payer: Self-pay

## 2022-08-31 NOTE — Patient Outreach (Addendum)
Care Coordination   Initial Visit Note   08/31/2022 Name: [name] MRN: [id number] DOB: May 08, 1946  [name] is a 77 y.o. year old female who sees Marco Collie, MD for primary care. I spoke with  Mardene Celeste by phone today.  What matters to the patients health and wellness today?  Patient states she feels like "overall she is doing well for her age." She has been undergoing chemo txs for lung CA. She voices txs are "currently on a pause as doctor said cancer was not getting any bigger or worse." She goes back to see oncologist on 09/30/22 and will determine rather or not she needs to resume txs. Patient states she was tolerating txs well-only SE was she was "just very tired." Patient voices that she does tire easily and gets "winded when up moving around" but sxs relieved with rest and using her inhaler.  Patient voices she had a fall about three months ago and fractured her hip. She is still recovering from that. Currently uses walker. She lives alone but has supportive son and daughter that lives nearby and assists her as needed. [facility] services reviewed and discussed. Patient gave verbal consent for service and in agreement with RN CM following up with her.     Goals Addressed               This Visit's Progress     "Cancer to not get any worse." (pt-stated)        Care Coordination Interventions: Evaluation of current treatment plan related to Cancer and patient's adherence to plan as established by provider Provided education to patient re: [facility] services Discussed plans with patient for ongoing care management follow up and provided patient with direct contact information for care management team Assessed social determinant of health barriers Assessed patient understanding of cancer diagnosis and recommended treatment plan Reviewed upcoming provider appointments and treatment appointments Assessed available transportation to appointments and treatments. Has  consistent/reliable transportation: Yes Assessed support system. Has consistent/reliable family or other support: Yes          SDOH assessments and interventions completed:  Yes  SDOH Interventions Today    Flowsheet Row Most Recent Value  SDOH Interventions   Food Insecurity Interventions Intervention Not Indicated  Transportation Interventions Intervention Not Indicated        Care Coordination Interventions:  Yes, provided    Interventions Today    Flowsheet Row Most Recent Value  Chronic Disease   Chronic disease during today's visit Diabetes, Other  [Cancer]  General Interventions   General Interventions Discussed/Reviewed General Interventions Discussed, Doctor Visits, Referral to Nurse  Doctor Visits Discussed/Reviewed Doctor Visits Discussed, PCP, Specialist  PCP/Specialist Visits Compliance with follow-up visit  Nutrition Interventions   Nutrition Discussed/Reviewed Nutrition Discussed, Decreasing sugar intake  Pharmacy Interventions   Pharmacy Dicussed/Reviewed Pharmacy Topics Discussed, Medications and their functions  Safety Interventions   Safety Discussed/Reviewed Safety Discussed, Home Safety, [place]       Follow up plan:  Appt scheduled with RN CM care coordinator for follow up    Encounter Outcome:  Pt. Visit Completed   Addendum: RN CM received notification that patient's new PCP office-[facility] does their own case mgmt services. RN CM made outreach to practice and discussed case with staff. Advised that patient has only been seen in office once so far and is being set up with chronic case mgmt services. Return call to  patient to advise her of the above.  Enzo Montgomery, RN,BSN,CCM [facility] Care Management Care Management Community Coordinator Direct Phone: [phone] Toll Free: [phone] Fax: [phone]

## 2022-09-01 ENCOUNTER — Encounter: Payer: Self-pay | Admitting: Oncology

## 2022-09-01 ENCOUNTER — Encounter: Payer: Self-pay | Admitting: Hematology and Oncology

## 2022-09-05 DIAGNOSIS — Z131 Encounter for screening for diabetes mellitus: Secondary | ICD-10-CM | POA: Diagnosis not present

## 2022-09-05 DIAGNOSIS — Z13220 Encounter for screening for lipoid disorders: Secondary | ICD-10-CM | POA: Diagnosis not present

## 2022-09-12 DIAGNOSIS — Z136 Encounter for screening for cardiovascular disorders: Secondary | ICD-10-CM | POA: Diagnosis not present

## 2022-09-12 DIAGNOSIS — E1151 Type 2 diabetes mellitus with diabetic peripheral angiopathy without gangrene: Secondary | ICD-10-CM | POA: Diagnosis not present

## 2022-09-12 DIAGNOSIS — C3490 Malignant neoplasm of unspecified part of unspecified bronchus or lung: Secondary | ICD-10-CM | POA: Diagnosis not present

## 2022-09-12 DIAGNOSIS — F17210 Nicotine dependence, cigarettes, uncomplicated: Secondary | ICD-10-CM | POA: Diagnosis not present

## 2022-09-12 DIAGNOSIS — Z0000 Encounter for general adult medical examination without abnormal findings: Secondary | ICD-10-CM | POA: Diagnosis not present

## 2022-09-12 DIAGNOSIS — Z139 Encounter for screening, unspecified: Secondary | ICD-10-CM | POA: Diagnosis not present

## 2022-09-12 DIAGNOSIS — Z0189 Encounter for other specified special examinations: Secondary | ICD-10-CM | POA: Diagnosis not present

## 2022-09-12 DIAGNOSIS — Z6821 Body mass index (BMI) 21.0-21.9, adult: Secondary | ICD-10-CM | POA: Diagnosis not present

## 2022-09-12 DIAGNOSIS — Z1389 Encounter for screening for other disorder: Secondary | ICD-10-CM | POA: Diagnosis not present

## 2022-09-18 ENCOUNTER — Other Ambulatory Visit: Payer: Self-pay

## 2022-09-18 ENCOUNTER — Other Ambulatory Visit: Payer: Self-pay | Admitting: Oncology

## 2022-09-18 DIAGNOSIS — C3431 Malignant neoplasm of lower lobe, right bronchus or lung: Secondary | ICD-10-CM

## 2022-09-18 DIAGNOSIS — M89411 Other hypertrophic osteoarthropathy, right shoulder: Secondary | ICD-10-CM

## 2022-09-18 DIAGNOSIS — G8929 Other chronic pain: Secondary | ICD-10-CM

## 2022-09-19 ENCOUNTER — Other Ambulatory Visit: Payer: Self-pay

## 2022-09-19 DIAGNOSIS — G8929 Other chronic pain: Secondary | ICD-10-CM

## 2022-09-19 DIAGNOSIS — C3431 Malignant neoplasm of lower lobe, right bronchus or lung: Secondary | ICD-10-CM

## 2022-09-19 MED ORDER — HYDROCODONE-ACETAMINOPHEN 5-325 MG PO TABS: 1.0000 | ORAL_TABLET | Freq: Four times a day (QID) | ORAL | 0 refills | Status: AC | PRN

## 2022-09-25 DIAGNOSIS — E1151 Type 2 diabetes mellitus with diabetic peripheral angiopathy without gangrene: Secondary | ICD-10-CM | POA: Diagnosis not present

## 2022-09-25 DIAGNOSIS — C3490 Malignant neoplasm of unspecified part of unspecified bronchus or lung: Secondary | ICD-10-CM | POA: Diagnosis not present

## 2022-09-25 DIAGNOSIS — K219 Gastro-esophageal reflux disease without esophagitis: Secondary | ICD-10-CM | POA: Diagnosis not present

## 2022-09-26 NOTE — Progress Notes (Incomplete)
Patient Care Team: Marco Collie, MD as PCP - General (Family Medicine) Sharmon Revere as Physician Assistant (Cardiology) Derwood Kaplan, MD as Consulting Physician (Oncology)  Clinic Day: 02/24/22  Referring physician: Townsend Roger, MD   ASSESSMENT & PLAN:  Assessment & Plan: Lung cancer, lower lobe [facility]) She received 1 dose of chemotherapy in June 2022, in [place], with combination cisplatin/Taxol/pembrolizumab, and tolerated this very poorly and ended up in the hospital twice. She is now receiving palliative pembrolizumab since October of 2022, and tolerating it well. CT chest from January 2023 is stable to mildly improved.  The patient took a break and went to [place] for a time and so she missed some doses. We did resume treatment with pembrolizumab every 3 weeks.  She did agree to a port placement as she realizes this is long-term therapy. She had repeat scans in September of 2023, and all appears stable. She has been off treatment for a while but was willing to resume it since her latest scan of January 2024 is stable.  He now prefers to stop treatment for now as her quality of life is poor.   Brain metastases [facility]) Brain metastases diagnosed in April 2022.  This presented with right sided head pain and pain of the right eye.  She received stereotactic radiation to the two brain lesions. MRI brain from September 26th revealed four metastatic deposits in the brain. Several show mild associated hemorrhage. Mild edema. No mass-effect or midline shift. We will continue to monitor these. MRI brain from January revealed a mixed response with one new enhancing lesion in the right cerebellar hemisphere measuring 3 mm. One lesion in the left frontal lobe has resolved, one lesion in the right occipital lobe is stable, and two lesions are mildly decreased in size.  Repeat scan in September of 2023 shows resolution of the right cerebellar lesion and decreased size of the right occipital  and bilateral frontal lesions. Repeat MRI of the brain in December, 2023 is stable.   Mild anemia Her hemoglobin was 12.3 in March, was down to 11.0, and down to 9.5 in September.  Iron studies revealed iron deficiency and she did not tolerate oral supplement so was given IV iron with good response. Her hemoglobin came up from 10.8 in November to 11.9 today. Her iron studies shows improvement of iron saturation from 10% to 15% and ferritin level from 9 to 153.   Osteopenia  This is obvious on the CT scan and she has had bilateral hip fractures, both occurring in 2023. I think she may need something for her bones. I recommended calcium with vitamin D to take twice daily if she can tolerate it.  A bone density scan was done this month and confirms osteopenia.  I will forward a copy to Dr. Marco Collie to see what she recommends beyond calcium and vitamin D.     Plan: I will see her back in 6 weeks with CBC and CMP. She has requested stopping treatment for 3 months and repeat scans to see if she has any improvements in her quality of life.I have recommended that she see a gastroenterologist and will be glad to make the referral. We will need to flush her port every 3 months.We can still provide palliative care for her, in conjunction with Dr. Nyra Capes.  I will check a TSH to make sure this is not a thyroid problem.  The patient understands the plans discussed today and is in agreement with them.  She knows to contact our office if she develops concerns prior to her next appointment. [facility] [address] [place] [address] Dept: [phone] Dept Fax: [phone]   No orders of the defined types were placed in this encounter.     CHIEF COMPLAINT:  CC: A 77 year old female with history of lung cancer here for 3 week evaluation  Current Treatment:  Pembrolizumab  INTERVAL HISTORY:  [name] is here today for  repeat clinical assessment. She states she has no energy or strength. She feels tired all the time and notes she is weak. Her daughter notes that she also may be frustrated since she cannot get around as she used to, especially with her bilateral hip fractures last year. Her hemoglobin has improved significantly, last reading from 07/28/22 was 12.6. She does note that she has a cough especially in the morning, and notes that sometimes her food doesn't go down all the way. She also notes that she has a swallowing problem sometimes. I have recommended that she see a gastroenterologist. She takes pain medication once a day at night. We will add a thyroid test to today's blood. She did request stopping treatment for 2-3 months and repeat scans to see if she has any improvements.  We will therefore stop her treatment and see her back in 6 weeks with CBC and CMP to see how she is getting along.  We will then plan to repeat scans 1 to 2 months after that appointment.  She will still need to have her port flushed every 3 months.  She denies signs of infection such as sore throat or any urinary symptoms.  She denies fevers. She denies pain. She denies nausea, vomiting, chest pain, dyspnea. Her appetite is fair and her weight has dropped 2 pounds since her last visit.  I have reviewed the past medical history, past surgical history, social history and family history with the patient and they are unchanged from previous note.  ALLERGIES:  is allergic to ergocalciferol.  MEDICATIONS:  Current Outpatient Medications  Medication Sig Dispense Refill   albuterol (VENTOLIN HFA) 108 (90 Base) MCG/ACT inhaler Inhale 2 puffs into the lungs every 4 (four) hours as needed.     ALPRAZolam (XANAX) 0.5 MG tablet Take 1 tablet (0.5 mg total) by mouth 3 (three) times daily as needed for anxiety. 90 tablet 0   atorvastatin (LIPITOR) 80 MG tablet Take 80 mg by mouth daily.     ATROVENT HFA 17 MCG/ACT inhaler Inhale into the lungs.      azithromycin (ZITHROMAX Z-PAK) 250 MG tablet 2 pills today, then 1 pill daily 6 each 0   benzonatate (TESSALON) 100 MG capsule Take 100 mg by mouth 3 (three) times daily.     gabapentin (NEURONTIN) 100 MG capsule TAKE 1 CAPSULE BY MOUTH TWICE A DAY 60 capsule 4   HYDROcodone-acetaminophen (NORCO) 5-325 MG tablet Take 1-2 tablets by mouth every 6 (six) hours as needed for moderate pain. 100 tablet 0   JANUVIA 100 MG tablet Take 100 mg by mouth daily.     losartan (COZAAR) 25 MG tablet Take 25 mg by mouth daily.     metoprolol tartrate (LOPRESSOR) 25 MG tablet Take 25 mg by mouth 2 (two) times daily.     mupirocin cream (BACTROBAN) 2 % Apply topically.     ondansetron (ZOFRAN) 4 MG tablet Take 1 tablet (4 mg total) by mouth every  4 (four) hours as needed for nausea. 90 tablet 3   pantoprazole (PROTONIX) 40 MG tablet Take 1 tablet (40 mg total) by mouth 2 (two) times daily. 60 tablet 5   prochlorperazine (COMPAZINE) 10 MG tablet Take 1 tablet (10 mg total) by mouth every 6 (six) hours as needed for nausea or vomiting. 90 tablet 3   rivaroxaban (XARELTO) 20 MG TABS tablet Take 1 tablet (20 mg total) by mouth daily with supper. 90 tablet 1   SIMBRINZA 1-0.2 % SUSP Place 1 drop into the right eye 2 (two) times daily.     No current facility-administered medications for this visit.    HISTORY OF PRESENT ILLNESS:   Oncology History  Lung cancer, lower lobe  07/07/2020 Initial Diagnosis   Lung cancer, lower lobe ([place])   10/15/2020 Cancer Staging   Staging form: Lung, AJCC 8th Edition - Clinical stage from 10/15/2020: Stage IV (cT1b, cN0, cM1) - Signed by Derwood Kaplan, MD on 03/31/2021 Histopathologic type: Squamous cell carcinoma, NOS Stage prefix: Initial diagnosis Histologic grade (G): G3 Histologic grading system: 4 grade system Laterality: Right Tumor size (mm): 18 Sites of metastasis: Brain Lymph-vascular invasion (LVI): Presence of LVI unknown/indeterminate Diagnostic  confirmation: Positive histology Specimen type: Core Needle Biopsy Staged by: Managing physician Type of lung cancer: Locally advanced or metastatic non-small cell lung cancer Stage used in treatment planning: Yes National guidelines used in treatment planning: Yes Type of national guideline used in treatment planning: NCCN   04/08/2021 - 02/07/2022 Chemotherapy   Patient is on Treatment Plan : LUNG NSCLC flat dose Pembrolizumab Q21D     04/08/2021 -  Chemotherapy   Patient is on Treatment Plan : LUNG NSCLC Pembrolizumab (200) q21d         REVIEW OF SYSTEMS:  Review of Systems  Constitutional:  Positive for appetite change and fatigue. Negative for chills, fever and unexpected weight change.  HENT:  Negative.  Negative for lump/mass, mouth sores and sore throat.   Eyes: Negative.   Respiratory:  Positive for cough. Negative for chest tightness, hemoptysis, shortness of breath and wheezing.   Cardiovascular: Negative.  Negative for chest pain, leg swelling and palpitations.  Gastrointestinal: Negative.  Negative for abdominal distention, abdominal pain, blood in stool, constipation, diarrhea, nausea and vomiting.  Endocrine: Negative.         She has had several episodes of severe hypoglycemia.    Genitourinary: Negative.  Negative for difficulty urinating, dysuria, frequency and hematuria.   Musculoskeletal:  Positive for arthralgias and back pain. Negative for flank pain and gait problem.  Skin: Negative.   Neurological:  Negative for dizziness, extremity weakness, gait problem, headaches, light-headedness, numbness, seizures and speech difficulty.  Hematological: Negative.  Negative for adenopathy. Does not bruise/bleed easily.  Psychiatric/Behavioral: Negative.  Negative for depression and sleep disturbance. The patient is not nervous/anxious.        VITALS:  There were no vitals taken for this visit.  Wt Readings from Last 3 Encounters:  08/15/22 124 lb 11.2 oz (56.6 kg)   07/28/22 126 lb 1.3 oz (57.2 kg)  07/28/22 126 lb 9.6 oz (57.4 kg)    There is no height or weight on file to calculate BMI.  Performance status (ECOG): 1 - Symptomatic but completely ambulatory  PHYSICAL EXAM:  Physical Exam Vitals and nursing note reviewed.  Constitutional:      General: She is not in acute distress.    Appearance: Normal appearance. She is normal weight. She is not  ill-appearing, toxic-appearing or diaphoretic.  HENT:     Head: Normocephalic and atraumatic.     Right Ear: Tympanic membrane, ear canal and external ear normal. There is no impacted cerumen.     Left Ear: Tympanic membrane, ear canal and external ear normal. There is no impacted cerumen.     Nose: Nose normal. No congestion or rhinorrhea.     Mouth/Throat:     Mouth: Mucous membranes are moist.     Pharynx: Oropharynx is clear. No oropharyngeal exudate or posterior oropharyngeal erythema.  Eyes:     General: No scleral icterus.       Right eye: No discharge.        Left eye: No discharge.     Extraocular Movements: Extraocular movements intact.     Conjunctiva/sclera: Conjunctivae normal.     Pupils: Pupils are equal, round, and reactive to light.  Neck:     Vascular: No carotid bruit.  Cardiovascular:     Rate and Rhythm: Normal rate and regular rhythm.     Pulses: Normal pulses.     Heart sounds: Normal heart sounds. No murmur heard.    No friction rub. No gallop.  Pulmonary:     Effort: Pulmonary effort is normal. No respiratory distress.     Breath sounds: No stridor. Rales present. No wheezing or rhonchi.     Comments: A few rales in the left base Chest:     Chest wall: No tenderness.  Abdominal:     General: Bowel sounds are normal. There is no distension.     Palpations: Abdomen is soft. There is no hepatomegaly, splenomegaly or mass.     Tenderness: There is no abdominal tenderness. There is no right CVA tenderness, left CVA tenderness, guarding or rebound.     Hernia: No hernia  is present.  Musculoskeletal:        General: No swelling, tenderness, deformity or signs of injury. Normal range of motion.     Cervical back: Normal range of motion and neck supple. No rigidity or tenderness.     Right lower leg: No edema.     Left lower leg: No edema.  Lymphadenopathy:     Cervical: No cervical adenopathy.     Right cervical: No superficial, deep or posterior cervical adenopathy.    Left cervical: No superficial, deep or posterior cervical adenopathy.     Upper Body:     Right upper body: No supraclavicular, axillary or pectoral adenopathy.     Left upper body: No supraclavicular, axillary or pectoral adenopathy.  Skin:    General: Skin is warm and dry.     Coloration: Skin is pale. Skin is not jaundiced.     Findings: No bruising, erythema, lesion or rash.  Neurological:     General: No focal deficit present.     Mental Status: She is alert and oriented to person, place, and time. Mental status is at baseline.     Cranial Nerves: No cranial nerve deficit.     Sensory: No sensory deficit.     Motor: Weakness present.     Coordination: Coordination normal.     Gait: Gait normal.     Deep Tendon Reflexes: Reflexes normal.     Comments: She has a right hemiparesis.  Psychiatric:        Mood and Affect: Mood normal.        Behavior: Behavior normal.        Thought Content: Thought content normal.  Judgment: Judgment normal.    LABORATORY DATA:  I have reviewed the data as listed    Component Value Date/Time   NA 141 08/15/2022 1005   NA 140 07/28/2022 0000   K 4.1 08/15/2022 1005   CL 103 08/15/2022 1005   CO2 27 08/15/2022 1005   GLUCOSE 124 (H) 08/15/2022 1005   BUN 17 08/15/2022 1005   BUN 11 07/28/2022 0000   CREATININE 0.93 08/15/2022 1005   CALCIUM 9.7 08/15/2022 1005   PROT 7.4 08/15/2022 1005   ALBUMIN 4.4 08/15/2022 1005   AST 20 08/15/2022 1005   ALT 12 08/15/2022 1005   ALKPHOS 72 08/15/2022 1005   BILITOT 0.7 08/15/2022 1005    GFRNONAA >60 08/15/2022 1005   GFRAA 59 (L) 10/20/2016 1618    No results found for: "SPEP", "UPEP"  Lab Results  Component Value Date   WBC 7.9 08/15/2022   NEUTROABS 3.7 08/15/2022   HGB 13.8 08/15/2022   HCT 42.6 08/15/2022   MCV 91.4 08/15/2022   PLT 188 08/15/2022      Chemistry      Component Value Date/Time   NA 141 08/15/2022 1005   NA 140 07/28/2022 0000   K 4.1 08/15/2022 1005   CL 103 08/15/2022 1005   CO2 27 08/15/2022 1005   BUN 17 08/15/2022 1005   BUN 11 07/28/2022 0000   CREATININE 0.93 08/15/2022 1005   GLU 103 07/28/2022 0000      Component Value Date/Time   CALCIUM 9.7 08/15/2022 1005   ALKPHOS 72 08/15/2022 1005   AST 20 08/15/2022 1005   ALT 12 08/15/2022 1005   BILITOT 0.7 08/15/2022 1005       RADIOGRAPHIC STUDIES: I have personally reviewed the radiological images as listed and agreed with the findings in the report. No results found.      I,Jasmine M Lassiter,acting as a scribe for Derwood Kaplan, MD.,have documented all relevant documentation on the behalf of Derwood Kaplan, MD,as directed by  Derwood Kaplan, MD while in the presence of Derwood Kaplan, MD.

## 2022-09-27 ENCOUNTER — Ambulatory Visit: Payer: 59 | Admitting: Oncology

## 2022-09-27 ENCOUNTER — Other Ambulatory Visit: Payer: 59

## 2022-09-28 ENCOUNTER — Encounter: Payer: Self-pay | Admitting: Oncology

## 2022-09-28 ENCOUNTER — Encounter: Payer: Self-pay | Admitting: Hematology and Oncology

## 2022-10-03 ENCOUNTER — Telehealth: Payer: Self-pay | Admitting: Oncology

## 2022-10-03 NOTE — Telephone Encounter (Signed)
10/03/22 Spoke with patient daughter and confirmed appt

## 2022-10-09 DIAGNOSIS — N182 Chronic kidney disease, stage 2 (mild): Secondary | ICD-10-CM | POA: Diagnosis not present

## 2022-10-09 DIAGNOSIS — S7001XA Contusion of right hip, initial encounter: Secondary | ICD-10-CM | POA: Diagnosis not present

## 2022-10-09 DIAGNOSIS — M25551 Pain in right hip: Secondary | ICD-10-CM | POA: Diagnosis not present

## 2022-10-09 DIAGNOSIS — W19XXXA Unspecified fall, initial encounter: Secondary | ICD-10-CM | POA: Diagnosis not present

## 2022-10-10 NOTE — Progress Notes (Signed)
Patient Care Team: Abner Greenspan, MD as PCP - General (Family Medicine) Kennon Rounds as Physician Assistant (Cardiology) Dellia Beckwith, MD as Consulting Physician (Oncology)  Clinic Day: 10/11/22  Referring physician: Abner Greenspan, MD  ASSESSMENT & PLAN:  Assessment & Plan: Lung cancer, lower lobe [facility]) She received 1 dose of chemotherapy in June 2022, in [place], with combination cisplatin/Taxol/pembrolizumab, and tolerated this very poorly and ended up in the hospital twice. She is now receiving palliative pembrolizumab since October of 2022, and tolerating it well. CT chest from January 2023 is stable to mildly improved.  The patient took a break and went to [place] for a time and so she missed some doses. We did resume treatment with pembrolizumab every 3 weeks.  She did agree to a port placement as she realizes this is long-term therapy. She had repeat scans in September of 2023, and all appears stable. She has been off treatment for a while but was willing to resume it since her latest scan of January 2024 is stable.  She now prefers to stop treatment as of February, 2024.    Brain metastases [facility]) Brain metastases diagnosed in April 2022.  This presented with right sided head pain and pain of the right eye.  She received stereotactic radiation to the two brain lesions. MRI brain from September 26th revealed four metastatic deposits in the brain. Several show mild associated hemorrhage. Mild edema. No mass-effect or midline shift. We will continue to monitor these. MRI brain from January revealed a mixed response with one new enhancing lesion in the right cerebellar hemisphere measuring 3 mm. One lesion in the left frontal lobe has resolved, one lesion in the right occipital lobe is stable, and two lesions are mildly decreased in size.  Repeat scan in September of 2023 shows resolution of the right cerebellar lesion and decreased size of the right occipital and bilateral  frontal lesions. Repeat MRI of the brain in December, 2023 is stable.   Mild anemia Her hemoglobin was 12.3 in March, was down to 11.0, and down to 9.5 in September.  Iron studies revealed iron deficiency and she did not tolerate oral supplement so was given IV iron with good response. Her hemoglobin came up from 10.8 in November to 11.9 today. Her iron studies shows improvement of iron saturation from 10% to 15% and ferritin level from 9 to 153.  Osteopenia  This is obvious on the CT scan and she has had bilateral hip fractures, both occurring in 2023. I think she may need something for her bones. I recommended calcium with vitamin D to take twice daily if she can tolerate it.  A bone density scan was done this month and confirms osteopenia.  I will forward a copy to Dr. Abner Greenspan to see what she recommends beyond calcium and vitamin D.   Plan: Patient fell 2 days ago on her right hip and was admitted to the ER.  She had a CT scan done that revealed no broken bones. However there is a transverse lucency. She has had fractures of both hips in the past. I recommended that she see Dr. Deberah Castle or his team. She and her son informed me that they would call someday this week. I will prescribe 5 mg oxycodone, I will order #20. Her bone density scan was on 08/03/2022 which revealed osteopenia with a T-score of -2.3 and -1.6. I advised her to take oral calcium supplements with vitamin D 600mg  to take twice a day.  She will take Fosamax 70mg  once a week. Her labs today are pending. I will see her back in 1 month with CBC and CMP. We can still provide palliative care for her, in conjunction with Dr. Yetta Flock. The patient understands the plans discussed today and is in agreement with them.  She knows to contact our office if she develops concerns prior to her next appointment.   I provided 20 minutes of face-to-face time during this this encounter and > 50% was spent counseling as documented under my assessment and  plan.   Dellia Beckwith, MD  [facility] AT [facility] [address] [place] [address] Dept: [phone] Dept Fax: [phone]      CHIEF COMPLAINT:  CC: A 77 year old female with lung cancer   Current Treatment:  Observation  INTERVAL HISTORY:  Anakaren is here today for repeat clinical assessment for her lung cancer. We have stopped her Pembrolizumab as of February. Patient fell 2 days ago on her right hip and was admitted into the ER she had a CT scan done that revealed no broken bones. However there is a transverse lucency. She is still extremely sore and rates the pain a 8/10, she can't put any weight on her legs and can't walk. She has had fractures of both hips in the past. I recommended that she see Dr. Deberah Castle or his team. She and her son informed me that they would call someday this week. I will prescribe 5mg  oxycodone, I will order #20. Her bone density scan was on 08/03/2022 which revealed osteopenia with a T-score of -2.3 and -1.6. I advised her to take oral calcium supplements with vitamin D 600mg  to take twice a day. I informed her of Fosamax, Reclast a once a year injection, and Prolia a twice a year injections. She will take Fosamax 70 mg once a week. Her labs today are pending. I will see her back in 1 month with CBC and CMP. She denies signs of infection such as sore throat, sinus drainage, cough, or urinary symptoms.  She denies fevers or recurrent chills. She denies pain. She denies nausea, vomiting, chest pain, dyspnea or cough. Her appetite is ok and her weight has decreased 1 pounds over last 2 months . She is accompanied at today's visit with her oldest son.    ALLERGIES:  is allergic to ergocalciferol.  MEDICATIONS:  Current Outpatient Medications  Medication Sig Dispense Refill   oxyCODONE (OXY IR/ROXICODONE) 5 MG immediate release tablet Take 1 tablet (5 mg total) by mouth every 4 (four) hours as needed for  severe pain. 20 tablet 0   albuterol (VENTOLIN HFA) 108 (90 Base) MCG/ACT inhaler Inhale 2 puffs into the lungs every 4 (four) hours as needed.     alendronate (FOSAMAX) 70 MG tablet Take 1 tablet (70 mg total) by mouth once a week. Take with a full glass of water on an empty stomach. 4 tablet 5   ALPRAZolam (XANAX) 0.5 MG tablet Take 1 tablet (0.5 mg total) by mouth 3 (three) times daily as needed for anxiety. 90 tablet 0   atorvastatin (LIPITOR) 80 MG tablet Take 80 mg by mouth daily.     ATROVENT HFA 17 MCG/ACT inhaler Inhale into the lungs.     azithromycin (ZITHROMAX Z-PAK) 250 MG tablet 2 pills today, then 1 pill daily 6 each 0   benzonatate (TESSALON) 100 MG capsule Take 100 mg by mouth 3 (three) times daily.  gabapentin (NEURONTIN) 100 MG capsule TAKE 1 CAPSULE BY MOUTH TWICE A DAY 60 capsule 4   HYDROcodone-acetaminophen (NORCO) 5-325 MG tablet Take 1-2 tablets by mouth every 6 (six) hours as needed for moderate pain. 100 tablet 0   JANUVIA 100 MG tablet Take 100 mg by mouth daily.     losartan (COZAAR) 25 MG tablet Take 25 mg by mouth daily.     metoprolol tartrate (LOPRESSOR) 25 MG tablet Take 25 mg by mouth 2 (two) times daily.     mupirocin cream (BACTROBAN) 2 % Apply topically.     ondansetron (ZOFRAN) 4 MG tablet Take 1 tablet (4 mg total) by mouth every 4 (four) hours as needed for nausea. 90 tablet 3   pantoprazole (PROTONIX) 40 MG tablet Take 1 tablet (40 mg total) by mouth 2 (two) times daily. 60 tablet 5   prochlorperazine (COMPAZINE) 10 MG tablet Take 1 tablet (10 mg total) by mouth every 6 (six) hours as needed for nausea or vomiting. 90 tablet 3   rivaroxaban (XARELTO) 20 MG TABS tablet Take 1 tablet (20 mg total) by mouth daily with supper. 90 tablet 1   SIMBRINZA 1-0.2 % SUSP Place 1 drop into the right eye 2 (two) times daily.     No current facility-administered medications for this visit.    HISTORY OF PRESENT ILLNESS:   Oncology History  Lung cancer, lower  lobe (HCC)  07/07/2020 Initial Diagnosis   Lung cancer, lower lobe (HCC)   10/15/2020 Cancer Staging   Staging form: Lung, AJCC 8th Edition - Clinical stage from 10/15/2020: Stage IV (cT1b, cN0, cM1) - Signed by Dellia Beckwith, MD on 03/31/2021 Histopathologic type: Squamous cell carcinoma, NOS Stage prefix: Initial diagnosis Histologic grade (G): G3 Histologic grading system: 4 grade system Laterality: Right Tumor size (mm): 18 Sites of metastasis: Brain Lymph-vascular invasion (LVI): Presence of LVI unknown/indeterminate Diagnostic confirmation: Positive histology Specimen type: Core Needle Biopsy Staged by: Managing physician Type of lung cancer: Locally advanced or metastatic non-small cell lung cancer Stage used in treatment planning: Yes National guidelines used in treatment planning: Yes Type of national guideline used in treatment planning: NCCN   04/08/2021 - 02/07/2022 Chemotherapy   Patient is on Treatment Plan : LUNG NSCLC flat dose Pembrolizumab Q21D     04/08/2021 -  Chemotherapy   Patient is on Treatment Plan : LUNG NSCLC Pembrolizumab (200) q21d       REVIEW OF SYSTEMS:  Review of Systems  Constitutional:  Positive for fatigue. Negative for appetite change, chills, diaphoresis, fever and unexpected weight change.  HENT:   Positive for trouble swallowing. Negative for hearing loss, lump/mass, mouth sores, nosebleeds, sore throat, tinnitus and voice change.   Eyes: Negative.  Negative for eye problems and icterus.  Respiratory: Negative.  Negative for chest tightness, cough, hemoptysis, shortness of breath and wheezing.   Cardiovascular: Negative.  Negative for chest pain, leg swelling and palpitations.  Gastrointestinal: Negative.  Negative for abdominal distention, abdominal pain, blood in stool, constipation, diarrhea, nausea, rectal pain and vomiting.  Endocrine: Negative.  Negative for hot flashes.  Genitourinary: Negative.  Negative for bladder  incontinence, difficulty urinating, dyspareunia, dysuria, frequency, hematuria, menstrual problem, nocturia, pelvic pain, vaginal bleeding and vaginal discharge.   Musculoskeletal:  Positive for arthralgias and back pain. Negative for flank pain, gait problem, myalgias, neck pain and neck stiffness.       Severe hip pain, can't walk  Skin: Negative.  Negative for itching, rash and wound.  Neurological:  Negative.  Negative for dizziness, extremity weakness, gait problem, headaches, light-headedness, numbness, seizures and speech difficulty.  Hematological: Negative.  Negative for adenopathy. Does not bruise/bleed easily.  Psychiatric/Behavioral: Negative.  Negative for confusion, decreased concentration, depression, sleep disturbance and suicidal ideas. The patient is not nervous/anxious.     VITALS:  Blood pressure (!) 145/92, pulse 81, temperature 99 F (37.2 C), temperature source Oral, resp. rate 18, height 5\' 7"  (1.702 m), weight 125 lb 3.2 oz (56.8 kg), SpO2 96 %.  Wt Readings from Last 3 Encounters:  10/11/22 125 lb 3.2 oz (56.8 kg)  08/15/22 124 lb 11.2 oz (56.6 kg)  07/28/22 126 lb 1.3 oz (57.2 kg)    Body mass index is 19.61 kg/m.  Performance status (ECOG): 1 - Symptomatic but completely ambulatory  PHYSICAL EXAM:  Physical Exam Vitals and nursing note reviewed. Exam conducted with a chaperone present.  Constitutional:      General: She is not in acute distress.    Appearance: Normal appearance. She is well-developed. She is not ill-appearing, toxic-appearing or diaphoretic.  HENT:     Head: Normocephalic and atraumatic.     Right Ear: Tympanic membrane, ear canal and external ear normal. There is no impacted cerumen.     Left Ear: Tympanic membrane, ear canal and external ear normal. There is no impacted cerumen.     Nose: Nose normal. No congestion or rhinorrhea.     Mouth/Throat:     Mouth: Mucous membranes are moist.     Pharynx: Oropharynx is clear. No oropharyngeal  exudate or posterior oropharyngeal erythema.  Eyes:     General: Lids are normal. Vision grossly intact. No scleral icterus.       Right eye: No discharge.        Left eye: No discharge.     Extraocular Movements: Extraocular movements intact.     Conjunctiva/sclera: Conjunctivae normal.     Pupils: Pupils are equal, round, and reactive to light.  Neck:     Vascular: No carotid bruit.  Cardiovascular:     Rate and Rhythm: Normal rate and regular rhythm.     Pulses: Normal pulses.     Heart sounds: Normal heart sounds. No murmur heard.    No friction rub. No gallop.  Pulmonary:     Effort: Pulmonary effort is normal. No respiratory distress.     Breath sounds: Normal breath sounds. No stridor. No wheezing, rhonchi or rales.     Comments: A few rales in the left base Chest:     Chest wall: No tenderness.  Abdominal:     General: Bowel sounds are normal. There is no distension.     Palpations: Abdomen is soft. There is no hepatomegaly, splenomegaly or mass.     Tenderness: There is no abdominal tenderness. There is no right CVA tenderness, left CVA tenderness, guarding or rebound.     Hernia: No hernia is present.  Musculoskeletal:        General: No swelling, tenderness, deformity or signs of injury. Normal range of motion.     Cervical back: Normal range of motion and neck supple. No rigidity or tenderness.     Right lower leg: No edema.     Left lower leg: No edema.  Lymphadenopathy:     Cervical: No cervical adenopathy.     Right cervical: No superficial, deep or posterior cervical adenopathy.    Left cervical: No superficial, deep or posterior cervical adenopathy.     Upper Body:  Right upper body: No supraclavicular, axillary or pectoral adenopathy.     Left upper body: No supraclavicular, axillary or pectoral adenopathy.  Skin:    General: Skin is warm and dry.     Coloration: Skin is pale. Skin is not jaundiced.     Findings: No bruising, erythema, lesion or rash.   Neurological:     General: No focal deficit present.     Mental Status: She is alert and oriented to person, place, and time. Mental status is at baseline.     Cranial Nerves: No cranial nerve deficit.     Sensory: No sensory deficit.     Motor: Weakness present.     Coordination: Coordination normal.     Gait: Gait normal.     Deep Tendon Reflexes: Reflexes normal.     Comments: She has a right hemiparesis.  Psychiatric:        Mood and Affect: Mood normal.        Behavior: Behavior normal. Behavior is cooperative.        Thought Content: Thought content normal.        Judgment: Judgment normal.    LABORATORY DATA:  CMP    Component Value Date/Time   NA 139 10/11/2022 1521   NA 140 07/28/2022 0000   K 3.8 10/11/2022 1521   CL 104 10/11/2022 1521   CO2 24 10/11/2022 1521   GLUCOSE 203 (H) 10/11/2022 1521   BUN 23 10/11/2022 1521   BUN 11 07/28/2022 0000   CREATININE 1.07 (H) 10/11/2022 1521   CALCIUM 9.3 10/11/2022 1521   PROT 7.2 10/11/2022 1521   ALBUMIN 4.2 10/11/2022 1521   AST 23 10/11/2022 1521   ALT 18 10/11/2022 1521   ALKPHOS 77 10/11/2022 1521   BILITOT 0.6 10/11/2022 1521   GFRNONAA 53 (L) 10/11/2022 1521   GFRAA 59 (L) 10/20/2016 1618    No results found for: "SPEP", "UPEP" CBC Lab Results  Component Value Date   WBC 8.8 10/11/2022   NEUTROABS 6.2 10/11/2022   HGB 12.3 10/11/2022   HCT 38.6 10/11/2022   MCV 91.5 10/11/2022   PLT 183 10/11/2022      Chemistry      Component Value Date/Time   NA 139 10/11/2022 1521   NA 140 07/28/2022 0000   K 3.8 10/11/2022 1521   CL 104 10/11/2022 1521   CO2 24 10/11/2022 1521   BUN 23 10/11/2022 1521   BUN 11 07/28/2022 0000   CREATININE 1.07 (H) 10/11/2022 1521   GLU 103 07/28/2022 0000      Component Value Date/Time   CALCIUM 9.3 10/11/2022 1521   ALKPHOS 77 10/11/2022 1521   AST 23 10/11/2022 1521   ALT 18 10/11/2022 1521   BILITOT 0.6 10/11/2022 1521     Component Ref Range & Units 3 mo ago  4 mo ago  Hgb A1c MFr Bld 4.8 - 5.6 % 6.0 High  5.8 High  CM       RADIOGRAPHIC STUDIES: I have personally reviewed the radiological images as listed and agreed with the findings in the report.   CLINICAL DATA: Lung cancer, metastatic to brain EXAM:07/21/22 CT CHEST WITH CONTRAST IMPRESSION: Stable spiculated right lower lobe lung nodule. There is some smaller areas of nodularity elsewhere in the right loert lobe which are also stable. Recommend continued close surveillance in 3 months.  New subtle area of ill-defined nodular opacity left lower lobe the left costophrenic angle. This very well could be benign etiology but  with the overall appearance recommend additional surveillance in 3 months,  Postop chest. Chest port  Stable left adrenal nodule.  Aortic Atherosclerosis and Emphysema.    EXAM: 03/14/22 MRI HEAD WITHOUT AND WITH CONTRAST IMPRESSION: No new enhancing lesions. Unchanged appearance of previously noted right frontal, right occipital, and left frontal lesions.      I,Jasmine M Lassiter,acting as a scribe for Dellia Beckwith, MD.,have documented all relevant documentation on the behalf of Dellia Beckwith, MD,as directed by  Dellia Beckwith, MD while in the presence of Dellia Beckwith, MD.

## 2022-10-11 ENCOUNTER — Other Ambulatory Visit: Payer: Self-pay | Admitting: Oncology

## 2022-10-11 ENCOUNTER — Inpatient Hospital Stay: Payer: 59 | Attending: Oncology

## 2022-10-11 ENCOUNTER — Encounter: Payer: Self-pay | Admitting: Oncology

## 2022-10-11 ENCOUNTER — Inpatient Hospital Stay (INDEPENDENT_AMBULATORY_CARE_PROVIDER_SITE_OTHER): Payer: 59 | Admitting: Oncology

## 2022-10-11 VITALS — BP 145/92 | HR 81 | Temp 99.0°F | Resp 18 | Ht 67.0 in | Wt 125.2 lb

## 2022-10-11 DIAGNOSIS — C3431 Malignant neoplasm of lower lobe, right bronchus or lung: Secondary | ICD-10-CM | POA: Diagnosis not present

## 2022-10-11 DIAGNOSIS — C7931 Secondary malignant neoplasm of brain: Secondary | ICD-10-CM | POA: Diagnosis not present

## 2022-10-11 DIAGNOSIS — M8580 Other specified disorders of bone density and structure, unspecified site: Secondary | ICD-10-CM | POA: Diagnosis not present

## 2022-10-11 DIAGNOSIS — D509 Iron deficiency anemia, unspecified: Secondary | ICD-10-CM | POA: Diagnosis not present

## 2022-10-11 DIAGNOSIS — D649 Anemia, unspecified: Secondary | ICD-10-CM

## 2022-10-11 DIAGNOSIS — Z79620 Long term (current) use of immunosuppressive biologic: Secondary | ICD-10-CM | POA: Diagnosis not present

## 2022-10-11 DIAGNOSIS — D500 Iron deficiency anemia secondary to blood loss (chronic): Secondary | ICD-10-CM

## 2022-10-11 DIAGNOSIS — Z780 Asymptomatic menopausal state: Secondary | ICD-10-CM

## 2022-10-11 LAB — CMP (CANCER CENTER ONLY)
ALT: 18 U/L (ref 0–44)
AST: 23 U/L (ref 15–41)
Albumin: 4.2 g/dL (ref 3.5–5.0)
Alkaline Phosphatase: 77 U/L (ref 38–126)
Anion gap: 11 (ref 5–15)
BUN: 23 mg/dL (ref 8–23)
CO2: 24 mmol/L (ref 22–32)
Calcium: 9.3 mg/dL (ref 8.9–10.3)
Chloride: 104 mmol/L (ref 98–111)
Creatinine: 1.07 mg/dL — ABNORMAL HIGH (ref 0.44–1.00)
GFR, Estimated: 53 mL/min — ABNORMAL LOW (ref >60)
Glucose, Bld: 203 mg/dL — ABNORMAL HIGH (ref 70–99)
Potassium: 3.8 mmol/L (ref 3.5–5.1)
Sodium: 139 mmol/L (ref 135–145)
Total Bilirubin: 0.6 mg/dL (ref 0.3–1.2)
Total Protein: 7.2 g/dL (ref 6.5–8.1)

## 2022-10-11 LAB — CBC WITH DIFFERENTIAL (CANCER CENTER ONLY)
Abs Immature Granulocytes: 0.02 10*3/uL (ref 0.00–0.07)
Basophils Absolute: 0 10*3/uL (ref 0.0–0.1)
Basophils Relative: 0 %
Eosinophils Absolute: 0.1 10*3/uL (ref 0.0–0.5)
Eosinophils Relative: 1 %
HCT: 38.6 % (ref 36.0–46.0)
Hemoglobin: 12.3 g/dL (ref 12.0–15.0)
Immature Granulocytes: 0 %
Lymphocytes Relative: 22 %
Lymphs Abs: 2 10*3/uL (ref 0.7–4.0)
MCH: 29.1 pg (ref 26.0–34.0)
MCHC: 31.9 g/dL (ref 30.0–36.0)
MCV: 91.5 fL (ref 80.0–100.0)
Monocytes Absolute: 0.5 10*3/uL (ref 0.1–1.0)
Monocytes Relative: 6 %
Neutro Abs: 6.2 10*3/uL (ref 1.7–7.7)
Neutrophils Relative %: 71 %
Platelet Count: 183 10*3/uL (ref 150–400)
RBC: 4.22 MIL/uL (ref 3.87–5.11)
RDW: 14 % (ref 11.5–15.5)
WBC Count: 8.8 10*3/uL (ref 4.0–10.5)
nRBC: 0 % (ref 0.0–0.2)

## 2022-10-11 LAB — TSH: TSH: 1.351 u[IU]/mL (ref 0.350–4.500)

## 2022-10-11 MED ORDER — ALENDRONATE SODIUM 70 MG PO TABS: 70.0000 mg | ORAL_TABLET | ORAL | 5 refills | Status: AC

## 2022-10-11 MED ORDER — OXYCODONE HCL 5 MG PO TABS: 5.0000 mg | ORAL_TABLET | ORAL | 0 refills | Status: AC | PRN

## 2022-10-12 ENCOUNTER — Ambulatory Visit: Payer: 59 | Admitting: Internal Medicine

## 2022-10-13 ENCOUNTER — Other Ambulatory Visit: Payer: Self-pay

## 2022-10-17 DIAGNOSIS — S7001XA Contusion of right hip, initial encounter: Secondary | ICD-10-CM | POA: Diagnosis not present

## 2022-10-17 DIAGNOSIS — S72001A Fracture of unspecified part of neck of right femur, initial encounter for closed fracture: Secondary | ICD-10-CM | POA: Diagnosis not present

## 2022-10-23 DIAGNOSIS — M25551 Pain in right hip: Secondary | ICD-10-CM | POA: Diagnosis not present

## 2022-10-25 ENCOUNTER — Encounter: Payer: Self-pay | Admitting: Oncology

## 2022-10-25 ENCOUNTER — Encounter: Payer: Self-pay | Admitting: Hematology and Oncology

## 2022-10-25 DIAGNOSIS — K219 Gastro-esophageal reflux disease without esophagitis: Secondary | ICD-10-CM | POA: Diagnosis not present

## 2022-10-25 DIAGNOSIS — E1151 Type 2 diabetes mellitus with diabetic peripheral angiopathy without gangrene: Secondary | ICD-10-CM | POA: Diagnosis not present

## 2022-10-30 DIAGNOSIS — M25551 Pain in right hip: Secondary | ICD-10-CM | POA: Diagnosis not present

## 2022-11-07 NOTE — Progress Notes (Signed)
Patient Care Team: Abner Greenspan, MD as PCP - General (Family Medicine) Kennon Rounds as Physician Assistant (Cardiology) Dellia Beckwith, MD as Consulting Physician (Oncology)  Clinic Day: 11/08/22  Referring physician: Abner Greenspan, MD  ASSESSMENT & PLAN:  Assessment & Plan: Lung cancer, lower lobe [facility]) She received 1 dose of chemotherapy in June 2022, in [place], with combination cisplatin/Taxol/pembrolizumab, and tolerated this very poorly and ended up in the hospital twice. She is now receiving palliative pembrolizumab since October of 2022, and was tolerating it well. CT chest from January 2023 was stable to mildly improved.  The patient took a break and went to [place] for a time and so she missed some doses. We did resume treatment with pembrolizumab every 3 weeks.  She did agree to a port placement as she realizes this is long-term therapy. She had repeat scans in September of 2023, and all appears stable. She has been off treatment for a while but was willing to resume it since her latest scan of January 2024 is stable.  She preferred to stop treatment as of February, 2024, but doesn't feel any better, and is willing to resume it.  I recommended we schedule repeat scans before doing so and will set those up prior to her next appointment in 1 month.    Brain metastases [facility]) Brain metastases diagnosed in April 2022.  This presented with right sided head pain and pain of the right eye.  She received stereotactic radiation to the two brain lesions. MRI brain from September 26th revealed four metastatic deposits in the brain. Several show mild associated hemorrhage. Mild edema. No mass-effect or midline shift. We will continue to monitor these. MRI brain from January revealed a mixed response with one new enhancing lesion in the right cerebellar hemisphere measuring 3 mm. One lesion in the left frontal lobe has resolved, one lesion in the right occipital lobe is stable, and two  lesions are mildly decreased in size.  Repeat scan in September of 2023 shows resolution of the right cerebellar lesion and decreased size of the right occipital and bilateral frontal lesions. Repeat MRI of the brain in December, 2023 is stable.   Mild anemia Her hemoglobin was 12.3 in March, was down to 11.0, and down to 9.5 in September.  Iron studies revealed iron deficiency and she did not tolerate oral supplement so was given IV iron with good response. Her hemoglobin came up from 10.8 in November to 11.9 today. Her iron studies shows improvement of iron saturation from 10% to 15% and ferritin level from 9 to 153. Her hemoglobin is back down a little to 11.7 so I will repeat iron studies next time.  Osteopenia  This is obvious on the CT scan and she has had bilateral hip fractures, both occurring in 2023. I think she may need something for her bones. I recommended calcium with vitamin D to take twice daily if she can tolerate it.  A bone density scan was done this month and confirms osteopenia.  I will forward a copy to Dr. Abner Greenspan to see what she recommends beyond calcium and vitamin D.  Plan: Her bone density scan was on 08/03/2022 and revealed osteopenia with a T-score of -2.3 and -1.6. I advised her to take oral calcium supplements with vitamin D 600 mg twice daily. When I saw her in April, I recommended Fosamax 70mg  weekly. Her last CT chest was in January, 2024 and the MRI of the brain was in December,  2023. Her labs today are pending. I will see her back in 1 month with CBC, CMP, and repeat CT of the chest and MRI C-spine and brain. She is complaining of severe right neck pain.  We can still provide palliative care for her, in conjunction with Dr. Yetta Flock. The patient understands the plans discussed today and is in agreement with them.  She knows to contact our office if she develops concerns prior to her next appointment.   I provided 20 minutes of face-to-face time during this this  encounter and > 50% was spent counseling as documented under my assessment and plan.   Dellia Beckwith, MD  [facility] AT [facility] [address] [place] [address] Dept: [phone] Dept Fax: [phone]   CHIEF COMPLAINT:  CC: A 77 year old female with lung cancer   Current Treatment:  Observation  INTERVAL HISTORY:  Gladies is here today for repeat clinical assessment for her lung cancer. We have stopped her Pembrolizumab as of February 2024, but she doesn't feel any better. Patient states that she doesn't feel the best and is still in pain and complains of headaches with increased pressure in the back of her neck, severe hip and leg pain. She states that she staggers when she stands and walks. She has had fractures of both hips. Her bone density scan was on 08/03/2022 which revealed osteopenia with a T-score of -2.3 and -1.6. I advised her to take oral calcium supplements with vitamin D 600 mg twice daily. When I saw her in April, I recommended Fosamax 70 mg weekly. Her last CT chest was in January, 2024 and the MRI of the brain was in December, 2023. Her labs today are pending. I will see her back in 1 month with CBC, CMP, and repeat CT of the chest and MRI Cervical spine and brain. She denies signs of infection such as sore throat, sinus drainage, or urinary symptoms.  She denies fevers or recurrent chills. She denies pain. She denies nausea, vomiting, chest pain, dyspnea or cough. Her appetite is poor and she makes herself eat and her weight has increased 1 pounds over last month . She is accompanied at today's visit by her  son.    ALLERGIES:  is allergic to ergocalciferol.  MEDICATIONS:  Current Outpatient Medications  Medication Sig Dispense Refill   albuterol (VENTOLIN HFA) 108 (90 Base) MCG/ACT inhaler Inhale 2 puffs into the lungs every 4 (four) hours as needed.     alendronate (FOSAMAX) 70 MG tablet Take 1 tablet (70 mg  total) by mouth once a week. Take with a full glass of water on an empty stomach. 4 tablet 5   ALPRAZolam (XANAX) 0.5 MG tablet Take 1 tablet (0.5 mg total) by mouth 3 (three) times daily as needed for anxiety. 90 tablet 0   atorvastatin (LIPITOR) 80 MG tablet Take 80 mg by mouth daily.     ATROVENT HFA 17 MCG/ACT inhaler Inhale into the lungs.     azithromycin (ZITHROMAX Z-PAK) 250 MG tablet 2 pills today, then 1 pill daily 6 each 0   benzonatate (TESSALON) 100 MG capsule Take 100 mg by mouth 3 (three) times daily.     gabapentin (NEURONTIN) 100 MG capsule TAKE 1 CAPSULE BY MOUTH TWICE A DAY 60 capsule 4   glipiZIDE (GLUCOTROL XL) 5 MG 24 hr tablet TAKE 1 TABLET BY MOUTH EVERY DAY WITH BREAKFAST 90 tablet 0   HYDROcodone-acetaminophen (NORCO) 5-325 MG tablet  Take 1-2 tablets by mouth every 6 (six) hours as needed for moderate pain. 100 tablet 0   JANUVIA 100 MG tablet Take 100 mg by mouth daily.     losartan (COZAAR) 25 MG tablet Take 25 mg by mouth daily.     metoprolol tartrate (LOPRESSOR) 25 MG tablet TAKE 1 TABLET BY MOUTH TWO TIMES DAILY. 180 tablet 2   mupirocin cream (BACTROBAN) 2 % Apply topically.     ondansetron (ZOFRAN) 4 MG tablet Take 1 tablet (4 mg total) by mouth every 4 (four) hours as needed for nausea. 90 tablet 3   oxyCODONE (OXY IR/ROXICODONE) 5 MG immediate release tablet Take 1 tablet (5 mg total) by mouth every 4 (four) hours as needed for severe pain. 20 tablet 0   pantoprazole (PROTONIX) 40 MG tablet Take 1 tablet (40 mg total) by mouth 2 (two) times daily. 60 tablet 5   prochlorperazine (COMPAZINE) 10 MG tablet Take 1 tablet (10 mg total) by mouth every 6 (six) hours as needed for nausea or vomiting. 90 tablet 3   rivaroxaban (XARELTO) 20 MG TABS tablet Take 1 tablet (20 mg total) by mouth daily with supper. 90 tablet 1   SIMBRINZA 1-0.2 % SUSP Place 1 drop into the right eye 2 (two) times daily.     No current facility-administered medications for this visit.     HISTORY OF PRESENT ILLNESS:   Oncology History  Lung cancer, lower lobe (HCC)  07/07/2020 Initial Diagnosis   Lung cancer, lower lobe (HCC)   10/15/2020 Cancer Staging   Staging form: Lung, AJCC 8th Edition - Clinical stage from 10/15/2020: Stage IV (cT1b, cN0, cM1) - Signed by Dellia Beckwith, MD on 03/31/2021 Histopathologic type: Squamous cell carcinoma, NOS Stage prefix: Initial diagnosis Histologic grade (G): G3 Histologic grading system: 4 grade system Laterality: Right Tumor size (mm): 18 Sites of metastasis: Brain Lymph-vascular invasion (LVI): Presence of LVI unknown/indeterminate Diagnostic confirmation: Positive histology Specimen type: Core Needle Biopsy Staged by: Managing physician Type of lung cancer: Locally advanced or metastatic non-small cell lung cancer Stage used in treatment planning: Yes National guidelines used in treatment planning: Yes Type of national guideline used in treatment planning: NCCN   04/08/2021 - 02/07/2022 Chemotherapy   Patient is on Treatment Plan : LUNG NSCLC flat dose Pembrolizumab Q21D     04/08/2021 -  Chemotherapy   Patient is on Treatment Plan : LUNG NSCLC Pembrolizumab (200) q21d       REVIEW OF SYSTEMS:  Review of Systems  Constitutional:  Positive for appetite change and fatigue. Negative for chills, diaphoresis, fever and unexpected weight change.  HENT:   Negative for hearing loss, lump/mass, mouth sores, nosebleeds, sore throat, tinnitus, trouble swallowing and voice change.   Eyes: Negative.  Negative for eye problems and icterus.  Respiratory: Negative.  Negative for chest tightness, cough, hemoptysis, shortness of breath and wheezing.   Cardiovascular: Negative.  Negative for chest pain, leg swelling and palpitations.  Gastrointestinal: Negative.  Negative for abdominal distention, abdominal pain, blood in stool, constipation, diarrhea, nausea, rectal pain and vomiting.  Endocrine: Negative.  Negative for hot  flashes.  Genitourinary: Negative.  Negative for bladder incontinence, difficulty urinating, dyspareunia, dysuria, frequency, hematuria, menstrual problem, nocturia, pelvic pain, vaginal bleeding and vaginal discharge.   Musculoskeletal:  Positive for arthralgias and back pain. Negative for flank pain, gait problem, myalgias, neck pain and neck stiffness.       Severe hip and leg pain, can't walk  Skin: Negative.  Negative for itching, rash and wound.  Neurological:  Positive for headaches. Negative for dizziness, extremity weakness, gait problem, light-headedness, numbness, seizures and speech difficulty.  Hematological: Negative.  Negative for adenopathy. Does not bruise/bleed easily.  Psychiatric/Behavioral: Negative.  Negative for confusion, decreased concentration, depression, sleep disturbance and suicidal ideas. The patient is not nervous/anxious.   All other systems reviewed and are negative.   VITALS:  Blood pressure 139/68, pulse 65, temperature 97.8 F (36.6 C), temperature source Oral, resp. rate 19, height 5\' 7"  (1.702 m), weight 126 lb 3.2 oz (57.2 kg), SpO2 100 %.  Wt Readings from Last 3 Encounters:  11/08/22 126 lb 3.2 oz (57.2 kg)  10/11/22 125 lb 3.2 oz (56.8 kg)  08/15/22 124 lb 11.2 oz (56.6 kg)    Body mass index is 19.77 kg/m.  Performance status (ECOG): 1 - Symptomatic but completely ambulatory  PHYSICAL EXAM:  Physical Exam Vitals and nursing note reviewed. Exam conducted with a chaperone present.  Constitutional:      General: She is not in acute distress.    Appearance: Normal appearance. She is well-developed. She is not ill-appearing, toxic-appearing or diaphoretic.  HENT:     Head: Normocephalic and atraumatic.     Right Ear: Tympanic membrane, ear canal and external ear normal. There is no impacted cerumen.     Left Ear: Tympanic membrane, ear canal and external ear normal. There is no impacted cerumen.     Nose: Nose normal. No congestion or  rhinorrhea.     Mouth/Throat:     Mouth: Mucous membranes are moist.     Pharynx: Oropharynx is clear. No oropharyngeal exudate or posterior oropharyngeal erythema.  Eyes:     General: Lids are normal. Vision grossly intact. No scleral icterus.       Right eye: No discharge.        Left eye: No discharge.     Extraocular Movements: Extraocular movements intact.     Conjunctiva/sclera: Conjunctivae normal.     Pupils: Pupils are equal, round, and reactive to light.  Neck:     Vascular: No carotid bruit.  Cardiovascular:     Rate and Rhythm: Normal rate and regular rhythm.     Pulses: Normal pulses.     Heart sounds: Normal heart sounds. No murmur heard.    No friction rub. No gallop.  Pulmonary:     Effort: Pulmonary effort is normal. No respiratory distress.     Breath sounds: Normal breath sounds. No stridor. No wheezing, rhonchi or rales.  Chest:     Chest wall: No tenderness.     Comments: Port in the right upper chest Abdominal:     General: Bowel sounds are normal. There is no distension.     Palpations: Abdomen is soft. There is no hepatomegaly, splenomegaly or mass.     Tenderness: There is no abdominal tenderness. There is no right CVA tenderness, left CVA tenderness, guarding or rebound.     Hernia: No hernia is present.  Musculoskeletal:        General: No swelling, tenderness, deformity or signs of injury. Normal range of motion.     Cervical back: Normal range of motion and neck supple. No rigidity or tenderness.     Right lower leg: No edema.     Left lower leg: No edema.  Lymphadenopathy:     Cervical: No cervical adenopathy.     Right cervical: No superficial, deep or posterior cervical adenopathy.    Left cervical: No  superficial, deep or posterior cervical adenopathy.     Upper Body:     Right upper body: No supraclavicular, axillary or pectoral adenopathy.     Left upper body: No supraclavicular, axillary or pectoral adenopathy.  Skin:    General: Skin is  warm and dry.     Coloration: Skin is pale. Skin is not jaundiced.     Findings: No bruising, erythema, lesion or rash.  Neurological:     General: No focal deficit present.     Mental Status: She is alert and oriented to person, place, and time. Mental status is at baseline.     Cranial Nerves: No cranial nerve deficit.     Sensory: No sensory deficit.     Motor: Weakness present.     Coordination: Coordination normal.     Gait: Gait normal.     Deep Tendon Reflexes: Reflexes normal.     Comments: She has a right hemiparesis.  Psychiatric:        Mood and Affect: Mood normal.        Behavior: Behavior normal. Behavior is cooperative.        Thought Content: Thought content normal.        Judgment: Judgment normal.    LABORATORY DATA:  CMP    Component Value Date/Time   NA 139 11/08/2022 1548   NA 140 07/28/2022 0000   K 3.7 11/08/2022 1548   CL 106 11/08/2022 1548   CO2 24 11/08/2022 1548   GLUCOSE 172 (H) 11/08/2022 1548   BUN 19 11/08/2022 1548   BUN 11 07/28/2022 0000   CREATININE 0.92 11/08/2022 1548   CALCIUM 8.8 (L) 11/08/2022 1548   PROT 6.6 11/08/2022 1548   ALBUMIN 3.9 11/08/2022 1548   AST 19 11/08/2022 1548   ALT 14 11/08/2022 1548   ALKPHOS 90 11/08/2022 1548   BILITOT 0.3 11/08/2022 1548   GFRNONAA >60 11/08/2022 1548   GFRAA 59 (L) 10/20/2016 1618    No results found for: "SPEP", "UPEP" CBC Lab Results  Component Value Date   WBC 8.0 11/08/2022   NEUTROABS 4.0 11/08/2022   HGB 11.7 (L) 11/08/2022   HCT 36.5 11/08/2022   MCV 93.6 11/08/2022   PLT 188 11/08/2022      Chemistry      Component Value Date/Time   NA 139 11/08/2022 1548   NA 140 07/28/2022 0000   K 3.7 11/08/2022 1548   CL 106 11/08/2022 1548   CO2 24 11/08/2022 1548   BUN 19 11/08/2022 1548   BUN 11 07/28/2022 0000   CREATININE 0.92 11/08/2022 1548   GLU 103 07/28/2022 0000      Component Value Date/Time   CALCIUM 8.8 (L) 11/08/2022 1548   ALKPHOS 90 11/08/2022 1548    AST 19 11/08/2022 1548   ALT 14 11/08/2022 1548   BILITOT 0.3 11/08/2022 1548     Component Ref Range & Units 3 wk ago (10/11/22) 4 mo ago (06/23/22) 7 mo ago (03/17/22) 8 mo ago (02/24/22) 9 mo ago (02/03/22) 9 mo ago (01/13/22) 10 mo ago (12/23/21)  TSH 0.350 - 4.500 uIU/mL 1.351 2.867 CM 0.979 CM 2.465 CM 2.852 CM 2.550 CM 2.502 CM   Component Ref Range & Units 3 mo ago 4 mo ago  Hgb A1c MFr Bld 4.8 - 5.6 % 6.0 High  5.8 High  CM       RADIOGRAPHIC STUDIES: I have personally reviewed the radiological images as listed and agreed with the findings in the  report.   CLINICAL DATA: Lung cancer, metastatic to brain EXAM:07/21/22 CT CHEST WITH CONTRAST IMPRESSION: Stable spiculated right lower lobe lung nodule. There is some smaller areas of nodularity elsewhere in the right loert lobe which are also stable. Recommend continued close surveillance in 3 months.  New subtle area of ill-defined nodular opacity left lower lobe the left costophrenic angle. This very well could be benign etiology but with the overall appearance recommend additional surveillance in 3 months,  Postop chest. Chest port  Stable left adrenal nodule.  Aortic Atherosclerosis and Emphysema.    EXAM: 03/14/22 MRI HEAD WITHOUT AND WITH CONTRAST IMPRESSION: No new enhancing lesions. Unchanged appearance of previously noted right frontal, right occipital, and left frontal lesions.     I,Jasmine M Lassiter,acting as a scribe for Dellia Beckwith, MD.,have documented all relevant documentation on the behalf of Dellia Beckwith, MD,as directed by  Dellia Beckwith, MD while in the presence of Dellia Beckwith, MD.

## 2022-11-08 ENCOUNTER — Inpatient Hospital Stay: Payer: 59

## 2022-11-08 ENCOUNTER — Other Ambulatory Visit: Payer: Self-pay | Admitting: Oncology

## 2022-11-08 ENCOUNTER — Other Ambulatory Visit: Payer: Self-pay | Admitting: Internal Medicine

## 2022-11-08 ENCOUNTER — Inpatient Hospital Stay: Payer: 59 | Attending: Oncology | Admitting: Oncology

## 2022-11-08 ENCOUNTER — Encounter: Payer: Self-pay | Admitting: Oncology

## 2022-11-08 VITALS — BP 139/68 | HR 65 | Temp 97.8°F | Resp 19 | Ht 67.0 in | Wt 126.2 lb

## 2022-11-08 DIAGNOSIS — C7931 Secondary malignant neoplasm of brain: Secondary | ICD-10-CM

## 2022-11-08 DIAGNOSIS — D649 Anemia, unspecified: Secondary | ICD-10-CM | POA: Diagnosis not present

## 2022-11-08 DIAGNOSIS — C3431 Malignant neoplasm of lower lobe, right bronchus or lung: Secondary | ICD-10-CM

## 2022-11-08 DIAGNOSIS — M8580 Other specified disorders of bone density and structure, unspecified site: Secondary | ICD-10-CM

## 2022-11-08 LAB — CMP (CANCER CENTER ONLY)
ALT: 14 U/L (ref 0–44)
AST: 19 U/L (ref 15–41)
Albumin: 3.9 g/dL (ref 3.5–5.0)
Alkaline Phosphatase: 90 U/L (ref 38–126)
Anion gap: 9 (ref 5–15)
BUN: 19 mg/dL (ref 8–23)
CO2: 24 mmol/L (ref 22–32)
Calcium: 8.8 mg/dL — ABNORMAL LOW (ref 8.9–10.3)
Chloride: 106 mmol/L (ref 98–111)
Creatinine: 0.92 mg/dL (ref 0.44–1.00)
GFR, Estimated: >60 mL/min (ref >60)
Glucose, Bld: 172 mg/dL — ABNORMAL HIGH (ref 70–99)
Potassium: 3.7 mmol/L (ref 3.5–5.1)
Sodium: 139 mmol/L (ref 135–145)
Total Bilirubin: 0.3 mg/dL (ref 0.3–1.2)
Total Protein: 6.6 g/dL (ref 6.5–8.1)

## 2022-11-08 LAB — CBC WITH DIFFERENTIAL (CANCER CENTER ONLY)
Abs Immature Granulocytes: 0.03 10*3/uL (ref 0.00–0.07)
Basophils Absolute: 0.1 10*3/uL (ref 0.0–0.1)
Basophils Relative: 1 %
Eosinophils Absolute: 0.2 10*3/uL (ref 0.0–0.5)
Eosinophils Relative: 3 %
HCT: 36.5 % (ref 36.0–46.0)
Hemoglobin: 11.7 g/dL — ABNORMAL LOW (ref 12.0–15.0)
Immature Granulocytes: 0 %
Lymphocytes Relative: 40 %
Lymphs Abs: 3.2 10*3/uL (ref 0.7–4.0)
MCH: 30 pg (ref 26.0–34.0)
MCHC: 32.1 g/dL (ref 30.0–36.0)
MCV: 93.6 fL (ref 80.0–100.0)
Monocytes Absolute: 0.5 10*3/uL (ref 0.1–1.0)
Monocytes Relative: 7 %
Neutro Abs: 4 10*3/uL (ref 1.7–7.7)
Neutrophils Relative %: 49 %
Platelet Count: 188 10*3/uL (ref 150–400)
RBC: 3.9 MIL/uL (ref 3.87–5.11)
RDW: 14.1 % (ref 11.5–15.5)
WBC Count: 8 10*3/uL (ref 4.0–10.5)
nRBC: 0 % (ref 0.0–0.2)

## 2022-11-10 ENCOUNTER — Other Ambulatory Visit: Payer: Self-pay

## 2022-11-10 ENCOUNTER — Telehealth: Payer: Self-pay

## 2022-11-10 NOTE — Telephone Encounter (Signed)
Patient notified and voiced understanding, copy faxed to PCP.

## 2022-11-10 NOTE — Telephone Encounter (Signed)
-----   Message from Dellia Beckwith, MD sent at 11/10/2022  8:49 AM EDT ----- Regarding: call Tell her labs are good, just a little anemic again, needs to take the iron.  Send copy to Dr. Andreas Blower

## 2022-11-17 ENCOUNTER — Telehealth: Payer: Self-pay | Admitting: Oncology

## 2022-11-17 NOTE — Telephone Encounter (Signed)
CT C/A/P has been scheduled for 12/06/22 @ 10:30; Checking in @ 9:30  MRI Brain has been scheduled for 12/06/22 @ 11:15   Notified pt of date,time and instructions.

## 2022-11-21 ENCOUNTER — Encounter: Payer: Self-pay | Admitting: Hematology and Oncology

## 2022-11-21 ENCOUNTER — Encounter: Payer: Self-pay | Admitting: Oncology

## 2022-11-21 ENCOUNTER — Other Ambulatory Visit: Payer: Self-pay | Admitting: Oncology

## 2022-11-21 DIAGNOSIS — D539 Nutritional anemia, unspecified: Secondary | ICD-10-CM

## 2022-11-25 DIAGNOSIS — E1151 Type 2 diabetes mellitus with diabetic peripheral angiopathy without gangrene: Secondary | ICD-10-CM | POA: Diagnosis not present

## 2022-11-25 DIAGNOSIS — K219 Gastro-esophageal reflux disease without esophagitis: Secondary | ICD-10-CM | POA: Diagnosis not present

## 2022-12-06 DIAGNOSIS — D3502 Benign neoplasm of left adrenal gland: Secondary | ICD-10-CM | POA: Diagnosis not present

## 2022-12-06 DIAGNOSIS — C3490 Malignant neoplasm of unspecified part of unspecified bronchus or lung: Secondary | ICD-10-CM | POA: Diagnosis not present

## 2022-12-06 DIAGNOSIS — G319 Degenerative disease of nervous system, unspecified: Secondary | ICD-10-CM | POA: Diagnosis not present

## 2022-12-06 DIAGNOSIS — C7931 Secondary malignant neoplasm of brain: Secondary | ICD-10-CM | POA: Diagnosis not present

## 2022-12-06 DIAGNOSIS — N200 Calculus of kidney: Secondary | ICD-10-CM | POA: Diagnosis not present

## 2022-12-06 DIAGNOSIS — C3431 Malignant neoplasm of lower lobe, right bronchus or lung: Secondary | ICD-10-CM | POA: Diagnosis not present

## 2022-12-06 DIAGNOSIS — G9389 Other specified disorders of brain: Secondary | ICD-10-CM | POA: Diagnosis not present

## 2022-12-06 LAB — BASIC METABOLIC PANEL
BUN: 11 (ref 4–21)
CO2: 26 — AB (ref 13–22)
Chloride: 103 (ref 99–108)
Creatinine: 0.7 (ref 0.5–1.1)
Glucose: 175
Potassium: 4 mEq/L (ref 3.5–5.1)
Sodium: 140 (ref 137–147)

## 2022-12-06 LAB — COMPREHENSIVE METABOLIC PANEL
Albumin: 4.1 (ref 3.5–5.0)
Calcium: 9 (ref 8.7–10.7)
EGFR (Non-African Amer.): >60

## 2022-12-06 LAB — CBC AND DIFFERENTIAL
HCT: 34 — AB (ref 36–46)
Hemoglobin: 11.5 — AB (ref 12.0–16.0)
Neutrophils Absolute: 4.26
Platelets: 205 10*3/uL (ref 150–400)
WBC: 7.6

## 2022-12-06 LAB — HEPATIC FUNCTION PANEL
ALT: 18 U/L (ref 7–35)
AST: 26 (ref 13–35)
Alkaline Phosphatase: 90 (ref 25–125)
Bilirubin, Total: 0.6

## 2022-12-06 LAB — CBC: RBC: 3.98 (ref 3.87–5.11)

## 2022-12-12 ENCOUNTER — Telehealth: Payer: Self-pay

## 2022-12-12 ENCOUNTER — Other Ambulatory Visit: Payer: Self-pay | Admitting: Oncology

## 2022-12-12 ENCOUNTER — Encounter: Payer: Self-pay | Admitting: Oncology

## 2022-12-12 ENCOUNTER — Inpatient Hospital Stay: Payer: 59 | Attending: Oncology | Admitting: Oncology

## 2022-12-12 VITALS — BP 157/72 | HR 75 | Temp 98.0°F | Resp 20 | Ht 67.0 in | Wt 131.2 lb

## 2022-12-12 DIAGNOSIS — C7931 Secondary malignant neoplasm of brain: Secondary | ICD-10-CM

## 2022-12-12 DIAGNOSIS — C3431 Malignant neoplasm of lower lobe, right bronchus or lung: Secondary | ICD-10-CM

## 2022-12-12 DIAGNOSIS — D509 Iron deficiency anemia, unspecified: Secondary | ICD-10-CM | POA: Diagnosis not present

## 2022-12-12 DIAGNOSIS — M8580 Other specified disorders of bone density and structure, unspecified site: Secondary | ICD-10-CM | POA: Diagnosis not present

## 2022-12-12 NOTE — Progress Notes (Signed)
Patient Care Team: Abner Greenspan, MD as PCP - General (Family Medicine) Kennon Rounds as Physician Assistant (Cardiology) Dellia Beckwith, MD as Consulting Physician (Oncology)  Clinic Day: 12/12/22  Referring physician: Abner Greenspan, MD  ASSESSMENT & PLAN:  Assessment & Plan: Lung cancer, lower lobe [facility]) She received 1 dose of chemotherapy in June 2022, in [place], with combination cisplatin/Taxol/pembrolizumab, and tolerated this very poorly and ended up in the hospital twice. She is now receiving palliative pembrolizumab since October of 2022, and was tolerating it well. CT chest from January 2023 was stable to mildly improved.  The patient took a break and went to [place] for a time and so she missed some doses. We did resume treatment with pembrolizumab every 3 weeks.  She did agree to a port placement as she realizes this is long-term therapy. She had repeat scans in September of 2023, and all appears stable. She has been off treatment for a while but was willing to resume it since her latest scan of January 2024 is stable.  She preferred to stop treatment as of February, 2024, but did not feel any better, and was willing to resume it.  I recommended we wait and so we scheduled repeat scans in June 2024, and they remain stable so we will not resume therapy at this time.    Brain metastases [facility]) Brain metastases diagnosed in April 2022.  This presented with right sided head pain and pain of the right eye.  She received stereotactic radiation to the two brain lesions. MRI brain from September 26th revealed four metastatic deposits in the brain. Several show mild associated hemorrhage. Mild edema. No mass-effect or midline shift. We will continue to monitor these. MRI brain from January revealed a mixed response with one new enhancing lesion in the right cerebellar hemisphere measuring 3 mm. One lesion in the left frontal lobe has resolved, one lesion in the right  occipital lobe is stable, and two lesions are mildly decreased in size.  Repeat scan in September of 2023 shows resolution of the right cerebellar lesion and decreased size of the right occipital and bilateral frontal lesions. Repeat MRI of the brain in June, 2024 is stable.   Mild anemia Her hemoglobin was 12.3 in March, was down to 11.0, and down to 9.5 in September.  Iron studies revealed iron deficiency and she did not tolerate oral supplement so was given IV iron with good response. Her hemoglobin came up from 10.8 in November to 11.9 today. Her iron studies show improvement of iron saturation from 10% to 15% and ferritin level from 9 to 153. Her hemoglobin is back down a little to 11.5.  Osteopenia  This is obvious on the CT scan and she has had bilateral hip fractures, both occurring in 2023. I think she may need something for her bones. I recommended calcium with vitamin D to take twice daily if she can tolerate it.  A bone density scan was done this month and confirms osteopenia.  I will forward a copy to Dr. Abner Greenspan to see what she recommends beyond calcium and vitamin D.  Plan: I reviewed the results of her labs with her. Her CT of chest, abdomen and pelvis done on 12/06/2022 revealed - Similar superior segment right lower lobe lung nodule. Given better resolution today, similar appearance of more peripheral right lower lobe lung nodules which are most likely are infectious/inflammatory, and no thoracic adenopathy. No acute process or  evidence of metastatic disease in the abdomen or pelvis, but she does have bilateral nephrolithiasis.  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  Her WBC is 7.6, hemoglobin is 11.5, platelet count is 205,000, and her CMP is normal.as of 12/06/2022. Her MRI of the head done on 12/06/2022 is stable. I recommended that she would not need to continue her Keytruda infusion  and I would continue to monitor her scans at 6 month intervals. She sees her PCP regularly and her next appointment is in 1 month. I will send her labs and scans over to her PCP. Her bone density scan was on 08/03/2022 which revealed osteopenia with a T-score of -2.3 and -1.6. I will see her back in 3 months with CBC and CMP. The patient understands the plans discussed today and is in agreement with them.  She knows to contact our office if she develops concerns prior to her next appointment.  I provided 25 minutes of face-to-face time during this this encounter and > 50% was spent counseling as documented under my assessment and plan.   Dellia Beckwith, MD  [facility] AT [facility] [address] [place] [address] Dept: [phone] Dept Fax: [phone]   CHIEF COMPLAINT:  CC: A 77 year old female with lung cancer   Current Treatment:  Observation  INTERVAL HISTORY:  [name] is here today for repeat clinical assessment for her lung cancer. We have stopped her Pembrolizumab as of February 2024, but she did not feel any better, or any worse. Patient states that she feels well and has no complaint of pain. She does try to stay active. I reviewed the results of her labs with her. Her CT of chest, abdomen and pelvis done on 12/06/2022 revealed - Similar superior segment right lower lobe lung nodule. Given better resolution today, similar appearance of more peripheral right lower lobe lung nodules which are most likely are infectious/inflammatory and no thoracic adenopathy.  There is no acute process or evidence of metastatic disease in the abdomen or pelvis, but she does have bilateral nephrolithiasis.  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 Her WBC is 7.6, hemoglobin is 11.5, platelet count is 205,000, and her CMP is normal.as of 12/06/2022. Her  MRI of the head done on 12/06/2022 is stable. I recommended that she would not need to continue her Keytruda infusion and I will continue to monitor her scans at 6 month intervals. She sees her PCP regularly and her next appointment is in 1 month. I will send her labs and scan reports over to her PCP. Her bone density scan was on 08/03/2022 which revealed osteopenia with a T-score of -2.3 and -1.6. I will see her back in 3 months with CBC and CMP.  She  denies signs of infections such as sore throat, sinus drainage, cough or urinary symptoms. She  denies fever or recurrent chills. She  also deny nausea, vomiting, chest pain dyspnea or cough. Her  appetite is fair and she forces herself to eat and Her  weight has increased 5 pounds over last 1 month  She is accompanied at today's visit by her  son.     ALLERGIES:  is allergic to ergocalciferol.  MEDICATIONS:  Current Outpatient Medications  Medication Sig Dispense Refill   albuterol (VENTOLIN HFA)  108 (90 Base) MCG/ACT inhaler Inhale 2 puffs into the lungs every 4 (four) hours as needed.     alendronate (FOSAMAX) 70 MG tablet Take 1 tablet (70 mg total) by mouth once a week. Take with a full glass of water on an empty stomach. 4 tablet 5   ALPRAZolam (XANAX) 0.5 MG tablet Take 1 tablet (0.5 mg total) by mouth 3 (three) times daily as needed for anxiety. 90 tablet 0   atorvastatin (LIPITOR) 80 MG tablet Take 80 mg by mouth daily.     ATROVENT HFA 17 MCG/ACT inhaler Inhale into the lungs.     azithromycin (ZITHROMAX Z-PAK) 250 MG tablet 2 pills today, then 1 pill daily 6 each 0   benzonatate (TESSALON) 100 MG capsule Take 100 mg by mouth 3 (three) times daily.     gabapentin (NEURONTIN) 100 MG capsule TAKE 1 CAPSULE BY MOUTH TWICE A DAY 60 capsule 4   glipiZIDE (GLUCOTROL XL) 5 MG 24 hr tablet TAKE 1 TABLET BY MOUTH EVERY DAY WITH BREAKFAST 90 tablet 0   HYDROcodone-acetaminophen (NORCO) 5-325 MG tablet Take 1-2 tablets by mouth every 6 (six) hours as  needed for moderate pain. 100 tablet 0   JANUVIA 100 MG tablet Take 100 mg by mouth daily.     losartan (COZAAR) 25 MG tablet Take 25 mg by mouth daily.     metoprolol tartrate (LOPRESSOR) 25 MG tablet TAKE 1 TABLET BY MOUTH TWO TIMES DAILY. 180 tablet 2   mupirocin cream (BACTROBAN) 2 % Apply topically.     ondansetron (ZOFRAN) 4 MG tablet Take 1 tablet (4 mg total) by mouth every 4 (four) hours as needed for nausea. 90 tablet 3   oxyCODONE (OXY IR/ROXICODONE) 5 MG immediate release tablet Take 1 tablet (5 mg total) by mouth every 4 (four) hours as needed for severe pain. 20 tablet 0   pantoprazole (PROTONIX) 40 MG tablet Take 1 tablet (40 mg total) by mouth 2 (two) times daily. 60 tablet 5   prochlorperazine (COMPAZINE) 10 MG tablet Take 1 tablet (10 mg total) by mouth every 6 (six) hours as needed for nausea or vomiting. 90 tablet 3   rivaroxaban (XARELTO) 20 MG TABS tablet Take 1 tablet (20 mg total) by mouth daily with supper. 90 tablet 1   SIMBRINZA 1-0.2 % SUSP Place 1 drop into the right eye 2 (two) times daily.     No current facility-administered medications for this visit.    HISTORY OF PRESENT ILLNESS:   Oncology History  Lung cancer, lower lobe (HCC)  07/07/2020 Initial Diagnosis   Lung cancer, lower lobe (HCC)   10/15/2020 Cancer Staging   Staging form: Lung, AJCC 8th Edition - Clinical stage from 10/15/2020: Stage IV (cT1b, cN0, cM1) - Signed by Dellia Beckwith, MD on 03/31/2021 Histopathologic type: Squamous cell carcinoma, NOS Stage prefix: Initial diagnosis Histologic grade (G): G3 Histologic grading system: 4 grade system Laterality: Right Tumor size (mm): 18 Sites of metastasis: Brain Lymph-vascular invasion (LVI): Presence of LVI unknown/indeterminate Diagnostic confirmation: Positive histology Specimen type: Core Needle Biopsy Staged by: Managing physician Type of lung cancer: Locally advanced or metastatic non-small cell lung cancer Stage used in treatment  planning: Yes National guidelines used in treatment planning: Yes Type of national guideline used in treatment planning: NCCN   04/08/2021 - 02/07/2022 Chemotherapy   Patient is on Treatment Plan : LUNG NSCLC flat dose Pembrolizumab Q21D     04/08/2021 - 07/28/2022 Chemotherapy   Patient is on Treatment  Plan : LUNG NSCLC Pembrolizumab (200) q21d       REVIEW OF SYSTEMS:  Review of Systems  Constitutional:  Positive for appetite change and fatigue. Negative for chills, diaphoresis, fever and unexpected weight change.  HENT:   Negative for hearing loss, lump/mass, mouth sores, nosebleeds, sore throat, tinnitus, trouble swallowing and voice change.   Eyes: Negative.  Negative for eye problems and icterus.  Respiratory: Negative.  Negative for chest tightness, cough, hemoptysis, shortness of breath and wheezing.   Cardiovascular: Negative.  Negative for chest pain, leg swelling and palpitations.  Gastrointestinal: Negative.  Negative for abdominal distention, abdominal pain, blood in stool, constipation, diarrhea, nausea, rectal pain and vomiting.  Endocrine: Negative.  Negative for hot flashes.  Genitourinary: Negative.  Negative for bladder incontinence, difficulty urinating, dyspareunia, dysuria, frequency, hematuria, menstrual problem, nocturia, pelvic pain, vaginal bleeding and vaginal discharge.   Musculoskeletal:  Positive for arthralgias and back pain. Negative for flank pain, gait problem, myalgias, neck pain and neck stiffness.       Severe hip and leg pain, can't walk  Skin: Negative.  Negative for itching, rash and wound.  Neurological:  Negative for dizziness, extremity weakness, gait problem, headaches, light-headedness, numbness, seizures and speech difficulty.  Hematological: Negative.  Negative for adenopathy. Does not bruise/bleed easily.  Psychiatric/Behavioral: Negative.  Negative for confusion, decreased concentration, depression, sleep disturbance and suicidal ideas. The  patient is not nervous/anxious.   All other systems reviewed and are negative.   VITALS:  Blood pressure (!) 157/72, pulse 75, temperature 98 F (36.7 C), temperature source Oral, resp. rate 20, height 5\' 7"  (1.702 m), weight 131 lb 3.2 oz (59.5 kg), SpO2 97 %.  Wt Readings from Last 3 Encounters:  12/12/22 131 lb 3.2 oz (59.5 kg)  11/08/22 126 lb 3.2 oz (57.2 kg)  10/11/22 125 lb 3.2 oz (56.8 kg)    Body mass index is 20.55 kg/m.  Performance status (ECOG): 1 - Symptomatic but completely ambulatory  PHYSICAL EXAM:  Physical Exam Vitals and nursing note reviewed. Exam conducted with a chaperone present.  Constitutional:      General: She is not in acute distress.    Appearance: Normal appearance. She is well-developed. She is not ill-appearing, toxic-appearing or diaphoretic.  HENT:     Head: Normocephalic and atraumatic.     Right Ear: Tympanic membrane, ear canal and external ear normal. There is no impacted cerumen.     Left Ear: Tympanic membrane, ear canal and external ear normal. There is no impacted cerumen.     Nose: Nose normal. No congestion or rhinorrhea.     Mouth/Throat:     Mouth: Mucous membranes are moist.     Pharynx: Oropharynx is clear. No oropharyngeal exudate or posterior oropharyngeal erythema.  Eyes:     General: Lids are normal. Vision grossly intact. No scleral icterus.       Right eye: No discharge.        Left eye: No discharge.     Extraocular Movements: Extraocular movements intact.     Conjunctiva/sclera: Conjunctivae normal.     Pupils: Pupils are equal, round, and reactive to light.  Neck:     Vascular: No carotid bruit.  Cardiovascular:     Rate and Rhythm: Normal rate and regular rhythm.     Pulses: Normal pulses.     Heart sounds: Normal heart sounds. No murmur heard.    No friction rub. No gallop.  Pulmonary:     Effort: Pulmonary effort is  normal. No respiratory distress.     Breath sounds: No stridor. Examination of the right-lower  field reveals wheezing. Wheezing present. No rhonchi or rales.  Chest:     Chest wall: No tenderness.     Comments: Port in the right upper chest Abdominal:     General: Bowel sounds are normal. There is no distension.     Palpations: Abdomen is soft. There is no hepatomegaly, splenomegaly or mass.     Tenderness: There is no abdominal tenderness. There is no right CVA tenderness, left CVA tenderness, guarding or rebound.     Hernia: No hernia is present.  Musculoskeletal:        General: No swelling, tenderness, deformity or signs of injury. Normal range of motion.     Cervical back: Normal range of motion and neck supple. No rigidity or tenderness.     Right lower leg: No edema.     Left lower leg: No edema.  Lymphadenopathy:     Cervical: No cervical adenopathy.     Right cervical: No superficial, deep or posterior cervical adenopathy.    Left cervical: No superficial, deep or posterior cervical adenopathy.     Upper Body:     Right upper body: No supraclavicular, axillary or pectoral adenopathy.     Left upper body: No supraclavicular, axillary or pectoral adenopathy.  Skin:    General: Skin is warm and dry.     Coloration: Skin is pale. Skin is not jaundiced.     Findings: No bruising, erythema, lesion or rash.  Neurological:     General: No focal deficit present.     Mental Status: She is alert and oriented to person, place, and time. Mental status is at baseline.     Cranial Nerves: No cranial nerve deficit.     Sensory: No sensory deficit.     Motor: Weakness present.     Coordination: Coordination normal.     Gait: Gait abnormal.     Deep Tendon Reflexes: Reflexes normal.     Comments: She has a right hemiparesis.  Psychiatric:        Mood and Affect: Mood normal.        Behavior: Behavior normal. Behavior is cooperative.        Thought Content: Thought content normal.        Judgment: Judgment normal.    LABORATORY DATA:  CMP    Component Value Date/Time   NA  140 12/06/2022 0000   K 4.0 12/06/2022 0000   CL 103 12/06/2022 0000   CO2 26 (A) 12/06/2022 0000   GLUCOSE 172 (H) 11/08/2022 1548   BUN 11 12/06/2022 0000   CREATININE 0.7 12/06/2022 0000   CREATININE 0.92 11/08/2022 1548   CALCIUM 9.0 12/06/2022 0000   PROT 6.6 11/08/2022 1548   ALBUMIN 4.1 12/06/2022 0000   AST 26 12/06/2022 0000   AST 19 11/08/2022 1548   ALT 18 12/06/2022 0000   ALT 14 11/08/2022 1548   ALKPHOS 90 12/06/2022 0000   BILITOT 0.3 11/08/2022 1548   GFRNONAA >60 11/08/2022 1548   GFRAA 59 (L) 10/20/2016 1618    No results found for: "SPEP", "UPEP" CBC Lab Results  Component Value Date   WBC 7.6 12/06/2022   NEUTROABS 4.26 12/06/2022   HGB 11.5 (A) 12/06/2022   HCT 34 (A) 12/06/2022   MCV 93.6 11/08/2022   PLT 205 12/06/2022      Chemistry      Component Value Date/Time   NA  140 12/06/2022 0000   K 4.0 12/06/2022 0000   CL 103 12/06/2022 0000   CO2 26 (A) 12/06/2022 0000   BUN 11 12/06/2022 0000   CREATININE 0.7 12/06/2022 0000   CREATININE 0.92 11/08/2022 1548   GLU 175 12/06/2022 0000      Component Value Date/Time   CALCIUM 9.0 12/06/2022 0000   ALKPHOS 90 12/06/2022 0000   AST 26 12/06/2022 0000   AST 19 11/08/2022 1548   ALT 18 12/06/2022 0000   ALT 14 11/08/2022 1548   BILITOT 0.3 11/08/2022 1548     Component Ref Range & Units 3 wk ago (10/11/22) 4 mo ago (06/23/22) 7 mo ago (03/17/22) 8 mo ago (02/24/22) 9 mo ago (02/03/22) 9 mo ago (01/13/22) 10 mo ago (12/23/21)  TSH 0.350 - 4.500 uIU/mL 1.351 2.867 CM 0.979 CM 2.465 CM 2.852 CM 2.550 CM 2.502 CM   Component Ref Range & Units 3 mo ago 4 mo ago  Hgb A1c MFr Bld 4.8 - 5.6 % 6.0 High  5.8 High  CM       RADIOGRAPHIC STUDIES: I have personally reviewed the radiological images as listed and agreed with the findings in the report. EXAM: 12/06/2022 MRI HEAD WITHOUT AND WITH CONTRAST IMPRESSION Mildly motion degraded exam. Within this limitation, findings are  follows. Chronic blood products and enhancements at site of a known lesion within the posterior right frontal lobe, unchanged from the prior brain MRI of 06/16/2022. Re-demonstrated chronic blood products at sites of known lesions within the left frontal operculum and right occipital lobe. No definite persistent enhancement at these sites. 3 mm nodular enhancing lesion within the left internal auditory canal, in retrospect present on prior examinations dating back to 03/21/2021. 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  EXAM: 12/06/2022 CT CHEST, ABDOMEN AND PELVIS WITH CONTRAST CT CHEST IMPRESSION Similar superior segment right lower lobe lung nodule. Given better resolution today, similar appearance of more peripheral right lower lobe lung nodules which are most likely are infectious/inflammatory. No thoracic adenopathy.  CT ABDOMEN AND PELVIS IMPRESSION No acute process or evidence of metastatic disease in the abdomen or pelvis. 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 In the absence of clinically indicated signs/symptoms require(s) no independent follow-up.       CLINICAL DATA: Lung cancer, metastatic to brain EXAM:07/21/22 CT CHEST WITH CONTRAST IMPRESSION: Stable spiculated right lower lobe lung nodule. There is some smaller areas of nodularity elsewhere in the right loert lobe which are also stable. Recommend continued close surveillance in 3 months.  New subtle area of ill-defined nodular opacity left lower lobe the left costophrenic angle. This very well could be benign etiology but with the overall appearance recommend additional surveillance in 3 months,  Postop chest. Chest port  Stable left adrenal nodule.  Aortic Atherosclerosis and Emphysema.    EXAM:  03/14/22 MRI HEAD WITHOUT AND WITH CONTRAST IMPRESSION: No new enhancing lesions. Unchanged appearance of previously noted right frontal, right occipital, and left frontal lesions.      I,Oluwatobi Asade,acting as a scribe for Dellia Beckwith, MD.,have documented all relevant documentation on the behalf of Dellia Beckwith, MD,as directed by  Dellia Beckwith, MD while in the presence of Dellia Beckwith, MD.

## 2022-12-12 NOTE — Telephone Encounter (Signed)
CT and labs were done and printed.

## 2022-12-12 NOTE — Telephone Encounter (Signed)
-----   Message from Dellia Beckwith, MD sent at 12/12/2022  1:15 PM EDT ----- Regarding: scans She was supposed to have labs, CT scans and MRI brain last week. Was any of it done?

## 2022-12-19 ENCOUNTER — Telehealth: Payer: Self-pay | Admitting: Oncology

## 2022-12-19 ENCOUNTER — Encounter: Payer: Self-pay | Admitting: *Deleted

## 2022-12-19 ENCOUNTER — Encounter: Payer: Self-pay | Admitting: Hematology and Oncology

## 2022-12-19 ENCOUNTER — Other Ambulatory Visit: Payer: Self-pay | Admitting: Oncology

## 2022-12-19 NOTE — Telephone Encounter (Signed)
Contacted pt to schedule an appt. Unable to reach via phone, voicemail was left.   RE: Follow Up Appointment Received: Today Dellia Beckwith, MD sent to [name] So 3 months with labs, won't need scans yet, so mid Sept.       Previous Messages    ----- Message ----- From: [name] Sent: 12/18/2022   8:19 AM EDT To: Dellia Beckwith, MD Subject: Follow Up Appointment                          Good Morning Dr. Gilman Buttner,  I have not seen any updated orders for this patient so I wanted to check when you would like to see her back in office. Please advise,  Thank you!  [name]

## 2022-12-26 DIAGNOSIS — J449 Chronic obstructive pulmonary disease, unspecified: Secondary | ICD-10-CM | POA: Diagnosis not present

## 2022-12-26 DIAGNOSIS — R0602 Shortness of breath: Secondary | ICD-10-CM | POA: Diagnosis not present

## 2022-12-26 DIAGNOSIS — J441 Chronic obstructive pulmonary disease with (acute) exacerbation: Secondary | ICD-10-CM | POA: Diagnosis not present

## 2022-12-26 DIAGNOSIS — Z6820 Body mass index (BMI) 20.0-20.9, adult: Secondary | ICD-10-CM | POA: Diagnosis not present

## 2023-01-09 DIAGNOSIS — E1151 Type 2 diabetes mellitus with diabetic peripheral angiopathy without gangrene: Secondary | ICD-10-CM | POA: Diagnosis not present

## 2023-01-22 ENCOUNTER — Other Ambulatory Visit: Payer: Self-pay | Admitting: Internal Medicine

## 2023-01-23 DIAGNOSIS — J449 Chronic obstructive pulmonary disease, unspecified: Secondary | ICD-10-CM | POA: Diagnosis not present

## 2023-01-23 DIAGNOSIS — Z6821 Body mass index (BMI) 21.0-21.9, adult: Secondary | ICD-10-CM | POA: Diagnosis not present

## 2023-01-23 DIAGNOSIS — E1151 Type 2 diabetes mellitus with diabetic peripheral angiopathy without gangrene: Secondary | ICD-10-CM | POA: Diagnosis not present

## 2023-01-23 DIAGNOSIS — M545 Low back pain, unspecified: Secondary | ICD-10-CM | POA: Diagnosis not present

## 2023-01-25 DIAGNOSIS — J449 Chronic obstructive pulmonary disease, unspecified: Secondary | ICD-10-CM | POA: Diagnosis not present

## 2023-01-25 DIAGNOSIS — E1151 Type 2 diabetes mellitus with diabetic peripheral angiopathy without gangrene: Secondary | ICD-10-CM | POA: Diagnosis not present

## 2023-02-14 ENCOUNTER — Other Ambulatory Visit: Payer: Self-pay | Admitting: Internal Medicine

## 2023-02-25 DIAGNOSIS — J449 Chronic obstructive pulmonary disease, unspecified: Secondary | ICD-10-CM | POA: Diagnosis not present

## 2023-02-25 DIAGNOSIS — E1151 Type 2 diabetes mellitus with diabetic peripheral angiopathy without gangrene: Secondary | ICD-10-CM | POA: Diagnosis not present

## 2023-03-15 ENCOUNTER — Ambulatory Visit: Payer: 59 | Admitting: Oncology

## 2023-03-15 ENCOUNTER — Other Ambulatory Visit: Payer: 59

## 2023-03-26 DIAGNOSIS — E1151 Type 2 diabetes mellitus with diabetic peripheral angiopathy without gangrene: Secondary | ICD-10-CM | POA: Diagnosis not present

## 2023-03-27 DIAGNOSIS — J449 Chronic obstructive pulmonary disease, unspecified: Secondary | ICD-10-CM | POA: Diagnosis not present

## 2023-03-27 DIAGNOSIS — E1151 Type 2 diabetes mellitus with diabetic peripheral angiopathy without gangrene: Secondary | ICD-10-CM | POA: Diagnosis not present

## 2023-03-29 ENCOUNTER — Other Ambulatory Visit: Payer: 59

## 2023-03-29 ENCOUNTER — Ambulatory Visit: Payer: 59 | Admitting: Oncology

## 2023-04-09 ENCOUNTER — Telehealth: Payer: Self-pay | Admitting: Oncology

## 2023-04-09 NOTE — Telephone Encounter (Signed)
Pt's daughter came in requesting if we could refer pt to an [facility] somewhere near [place] or near [place], [place].  Please call pt's daughter @ [phone].

## 2023-04-11 ENCOUNTER — Other Ambulatory Visit: Payer: Self-pay

## 2023-04-29 DIAGNOSIS — Z720 Tobacco use: Secondary | ICD-10-CM | POA: Diagnosis not present

## 2023-04-29 DIAGNOSIS — D649 Anemia, unspecified: Secondary | ICD-10-CM | POA: Diagnosis not present

## 2023-04-29 DIAGNOSIS — K5792 Diverticulitis of intestine, part unspecified, without perforation or abscess without bleeding: Secondary | ICD-10-CM | POA: Diagnosis not present

## 2023-04-29 DIAGNOSIS — D509 Iron deficiency anemia, unspecified: Secondary | ICD-10-CM | POA: Diagnosis not present

## 2023-04-29 DIAGNOSIS — R079 Chest pain, unspecified: Secondary | ICD-10-CM | POA: Diagnosis not present

## 2023-04-29 DIAGNOSIS — R0789 Other chest pain: Secondary | ICD-10-CM | POA: Diagnosis not present

## 2023-04-29 DIAGNOSIS — K5732 Diverticulitis of large intestine without perforation or abscess without bleeding: Secondary | ICD-10-CM | POA: Diagnosis not present

## 2023-04-29 DIAGNOSIS — I252 Old myocardial infarction: Secondary | ICD-10-CM | POA: Diagnosis not present

## 2023-04-29 DIAGNOSIS — M546 Pain in thoracic spine: Secondary | ICD-10-CM | POA: Diagnosis not present

## 2023-04-29 DIAGNOSIS — Z85118 Personal history of other malignant neoplasm of bronchus and lung: Secondary | ICD-10-CM | POA: Diagnosis not present

## 2023-05-01 DIAGNOSIS — C7931 Secondary malignant neoplasm of brain: Secondary | ICD-10-CM | POA: Diagnosis not present

## 2023-05-01 DIAGNOSIS — C3490 Malignant neoplasm of unspecified part of unspecified bronchus or lung: Secondary | ICD-10-CM | POA: Diagnosis not present

## 2023-05-02 ENCOUNTER — Other Ambulatory Visit: Payer: 59

## 2023-05-02 ENCOUNTER — Other Ambulatory Visit: Payer: Self-pay | Admitting: Internal Medicine

## 2023-05-02 ENCOUNTER — Ambulatory Visit: Payer: 59 | Admitting: Oncology

## 2023-05-07 DIAGNOSIS — I10 Essential (primary) hypertension: Secondary | ICD-10-CM | POA: Diagnosis not present

## 2023-05-07 DIAGNOSIS — R5383 Other fatigue: Secondary | ICD-10-CM | POA: Diagnosis not present

## 2023-05-07 DIAGNOSIS — E119 Type 2 diabetes mellitus without complications: Secondary | ICD-10-CM | POA: Diagnosis not present

## 2023-05-07 DIAGNOSIS — E785 Hyperlipidemia, unspecified: Secondary | ICD-10-CM | POA: Diagnosis not present

## 2023-05-16 ENCOUNTER — Other Ambulatory Visit: Payer: Self-pay | Admitting: Internal Medicine

## 2023-06-21 DIAGNOSIS — C3490 Malignant neoplasm of unspecified part of unspecified bronchus or lung: Secondary | ICD-10-CM | POA: Diagnosis not present

## 2023-06-21 DIAGNOSIS — Z7689 Persons encountering health services in other specified circumstances: Secondary | ICD-10-CM | POA: Diagnosis not present

## 2023-06-25 DIAGNOSIS — E1151 Type 2 diabetes mellitus with diabetic peripheral angiopathy without gangrene: Secondary | ICD-10-CM | POA: Diagnosis not present

## 2023-06-29 ENCOUNTER — Other Ambulatory Visit: Payer: Self-pay | Admitting: Internal Medicine

## 2023-07-02 DIAGNOSIS — G9389 Other specified disorders of brain: Secondary | ICD-10-CM | POA: Diagnosis not present

## 2023-07-02 DIAGNOSIS — C3481 Malignant neoplasm of overlapping sites of right bronchus and lung: Secondary | ICD-10-CM | POA: Diagnosis not present

## 2023-07-02 DIAGNOSIS — I6782 Cerebral ischemia: Secondary | ICD-10-CM | POA: Diagnosis not present

## 2023-07-05 DIAGNOSIS — C3490 Malignant neoplasm of unspecified part of unspecified bronchus or lung: Secondary | ICD-10-CM | POA: Diagnosis not present

## 2023-07-05 DIAGNOSIS — I2510 Atherosclerotic heart disease of native coronary artery without angina pectoris: Secondary | ICD-10-CM | POA: Diagnosis not present

## 2023-07-05 DIAGNOSIS — R911 Solitary pulmonary nodule: Secondary | ICD-10-CM | POA: Diagnosis not present

## 2023-07-05 DIAGNOSIS — N281 Cyst of kidney, acquired: Secondary | ICD-10-CM | POA: Diagnosis not present
